# Patient Record
Sex: Male | Born: 1960
Health system: Southern US, Community
[De-identification: ages and names within clinical notes are randomized; demographics above are authoritative.]

## PROBLEM LIST (undated history)

## (undated) DIAGNOSIS — Z955 Presence of coronary angioplasty implant and graft: Secondary | ICD-10-CM

## (undated) DIAGNOSIS — I252 Old myocardial infarction: Secondary | ICD-10-CM

## (undated) DIAGNOSIS — I10 Essential (primary) hypertension: Secondary | ICD-10-CM

## (undated) DIAGNOSIS — K760 Fatty (change of) liver, not elsewhere classified: Secondary | ICD-10-CM

## (undated) DIAGNOSIS — D649 Anemia, unspecified: Secondary | ICD-10-CM

## (undated) DIAGNOSIS — I5189 Other ill-defined heart diseases: Secondary | ICD-10-CM

## (undated) DIAGNOSIS — I255 Ischemic cardiomyopathy: Secondary | ICD-10-CM

## (undated) DIAGNOSIS — I509 Heart failure, unspecified: Secondary | ICD-10-CM

## (undated) DIAGNOSIS — H5702 Anisocoria: Secondary | ICD-10-CM

## (undated) DIAGNOSIS — I219 Acute myocardial infarction, unspecified: Secondary | ICD-10-CM

## (undated) DIAGNOSIS — K219 Gastro-esophageal reflux disease without esophagitis: Secondary | ICD-10-CM

## (undated) DIAGNOSIS — N189 Chronic kidney disease, unspecified: Secondary | ICD-10-CM

## (undated) DIAGNOSIS — N184 Chronic kidney disease, stage 4 (severe): Secondary | ICD-10-CM

## (undated) DIAGNOSIS — I4891 Unspecified atrial fibrillation: Secondary | ICD-10-CM

## (undated) HISTORY — DX: Chronic kidney disease, unspecified: N18.9

## (undated) HISTORY — PX: INSERTION OF DIALYSIS CATHETER: SHX1324

## (undated) HISTORY — PX: DIALYSIS FISTULA CREATION: SHX611

## (undated) NOTE — *Deleted (*Deleted)
  Echocardiogram Echocardiogram Transesophageal has been performed.  Johny Chess 12/05/2019, 5:26 PM

---

## 2005-05-13 ENCOUNTER — Emergency Department: Payer: Self-pay | Admitting: Emergency Medicine

## 2005-05-13 ENCOUNTER — Inpatient Hospital Stay (HOSPITAL_COMMUNITY): Admission: AD | Admit: 2005-05-13 | Discharge: 2005-05-16 | Payer: Self-pay | Admitting: Nephrology

## 2005-05-14 ENCOUNTER — Encounter (INDEPENDENT_AMBULATORY_CARE_PROVIDER_SITE_OTHER): Payer: Self-pay | Admitting: Cardiology

## 2005-05-14 ENCOUNTER — Encounter: Payer: Self-pay | Admitting: Vascular Surgery

## 2005-06-12 ENCOUNTER — Ambulatory Visit (HOSPITAL_COMMUNITY): Admission: RE | Admit: 2005-06-12 | Discharge: 2005-06-12 | Payer: Self-pay | Admitting: Vascular Surgery

## 2005-06-22 ENCOUNTER — Ambulatory Visit (HOSPITAL_COMMUNITY): Admission: RE | Admit: 2005-06-22 | Discharge: 2005-06-22 | Payer: Self-pay | Admitting: *Deleted

## 2005-11-24 ENCOUNTER — Ambulatory Visit (HOSPITAL_COMMUNITY): Admission: RE | Admit: 2005-11-24 | Discharge: 2005-11-24 | Payer: Self-pay | Admitting: Nephrology

## 2007-07-27 ENCOUNTER — Ambulatory Visit (HOSPITAL_COMMUNITY): Admission: RE | Admit: 2007-07-27 | Discharge: 2007-07-27 | Payer: Self-pay | Admitting: Nephrology

## 2008-03-26 ENCOUNTER — Ambulatory Visit (HOSPITAL_COMMUNITY): Admission: RE | Admit: 2008-03-26 | Discharge: 2008-03-26 | Payer: Self-pay | Admitting: Nephrology

## 2008-08-15 ENCOUNTER — Ambulatory Visit: Payer: Self-pay | Admitting: Nephrology

## 2008-08-16 ENCOUNTER — Ambulatory Visit (HOSPITAL_COMMUNITY): Admission: RE | Admit: 2008-08-16 | Discharge: 2008-08-16 | Payer: Self-pay | Admitting: Nephrology

## 2009-02-07 ENCOUNTER — Ambulatory Visit: Payer: Self-pay | Admitting: Vascular Surgery

## 2009-02-13 ENCOUNTER — Emergency Department (HOSPITAL_COMMUNITY): Admission: EM | Admit: 2009-02-13 | Discharge: 2009-02-13 | Payer: Self-pay | Admitting: Emergency Medicine

## 2009-02-16 DIAGNOSIS — Z940 Kidney transplant status: Secondary | ICD-10-CM

## 2009-02-16 HISTORY — DX: Kidney transplant status: Z94.0

## 2009-02-16 HISTORY — PX: KIDNEY TRANSPLANT: SHX239

## 2009-04-16 DIAGNOSIS — N189 Chronic kidney disease, unspecified: Secondary | ICD-10-CM

## 2009-04-16 HISTORY — DX: Chronic kidney disease, unspecified: N18.9

## 2010-03-09 ENCOUNTER — Encounter: Payer: Self-pay | Admitting: Nephrology

## 2010-06-03 LAB — POTASSIUM: Potassium: 5.4 mEq/L — ABNORMAL HIGH (ref 3.5–5.1)

## 2010-07-01 NOTE — Procedures (Signed)
CEPHALIC VEIN MAPPING   INDICATION:  End-stage renal disease.   HISTORY:  End-stage renal disease.   EXAM:  The right cephalic vein is compressible.   Diameter measurements range from 0.29 to 0.30.   The left cephalic vein is not evaluated.   See attached worksheet for all measurements.   IMPRESSION:  Patent left cephalic vein which is of acceptable diameter  for use as a dialysis access site.   ___________________________________________  Judeth Cornfield. Scot Dock, M.D.   MG/MEDQ  D:  02/07/2009  T:  02/07/2009  Job:  [id number]

## 2010-07-01 NOTE — Assessment & Plan Note (Signed)
OFFICE VISIT   [name], [name]  DOB:  Feb 12, 1961                                       02/07/2009  [id number]   I saw the patient in the office today to evaluate his upper arm fistula  in the left arm.  This was placed in May 2007 by Dr. Amedeo Plenty.  He had  previously had a forearm fistula which never matured and then  subsequently a month later had his upper arm fistula placed which has  been working well.  Recently he had developed some redness, warmth and  swelling over one of the aneurysms in his fistula and we were asked to  evaluate this.  He denies any history of fever or chills.  He states  that the fistula has been working well although they continue to stick  the two aneurysms in his fistula.   The patient does have a history of hypertension which is stable on his  current medications.  In addition he has a history of secondary  hyperparathyroidism and iron deficiency anemia both of which are stable.  He denies any history of diabetes, history of previous myocardial  infarction, history of congestive heart failure and history of COPD.   SOCIAL HISTORY:  Married and has two children.  He does not use tobacco.   REVIEW OF SYSTEMS:  CARDIOVASCULAR:  He has had no chest pain, chest  pressure, palpitations or arrhythmias.  He has had no claudication, rest  pain or nonhealing ulcers.  Has had no history of stroke, TIAs or  amaurosis fugax.  No history of DVT or phlebitis.  PULMONARY:  He has had no productive cough bronchitis, asthma or  wheezing.  NEUROLOGIC:  He has had no dizziness, blackouts, headaches or seizures.   PHYSICAL EXAMINATION:  This is a pleasant 50 year old gentleman who  appears his stated age.  Blood pressure 189/108, heart rate is 94,  respiratory rate 24.  Cardiovascular examination; I do not detect any  carotid bruits.  He has a regular rate and rhythm without murmur or  gallop appreciated.  He has no significant peripheral  edema.  He has  palpable radial and femoral pulses with warm well-perfused feet and no  ischemic ulcers.  Abdomen:  Soft and nontender with normal pitched bowel  sounds and no masses appreciated.  Neurologic examination; he has no  focal weakness or paresthesias.  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  The fistula has a good thrill.  Of note, he has  previously had stents placed in the more central portion of the vein.   The patient had a cephalic vein mapping today which I independently  interpreted which shows a somewhat small forearm and upper arm cephalic  vein on the right.   I have explained I do not think there is any way to salvage the fistula  on the left as his aneurysms are quite large.  I have recommended we  place new access in the right arm and once this is functioning we can  ligate his left arm fistula and perhaps resect the aneurysms.  Think it  is safe to continue to use the fistula in left arm as long as they do  not stick the aneurysms.  I have recommend we  explore his forearm  cephalic vein on the right and if this is adequate place a forearm  fistula.  If this is not adequate we will explore his upper arm cephalic  vein.  If neither are adequate then we would have to place an AV graft.  I have discussed the procedure and potential complications with the  patient.  He is agreeable to proceed.  Surgery has been scheduled for  January 11.     Judeth Cornfield. Scot Dock, M.D.  Electronically Signed   CSD/MEDQ  D:  02/07/2009  T:  02/12/2009  Job:  [id number]

## 2010-07-04 NOTE — Discharge Summary (Signed)
NAMECAESON, GALINSKY NO.:  [id number]   MEDICAL RECORD NO.:  [id number]          PATIENT TYPE:  INP   LOCATION:  5511                         FACILITY:  [facility]   PHYSICIAN:  Sherril Croon, M.D.   DATE OF BIRTH:  Jun 30, 1960   DATE OF ADMISSION:  05/13/2005  DATE OF DISCHARGE:  05/16/2005                                 DISCHARGE SUMMARY   ADMITTING DIAGNOSES:  1.  Chronic kidney disease suspected end-stage renal disease.  2.  Anemia of chronic disease.  3.  Hyperkalemia.  4.  Hypertension.  5.  Noncompliance.   DISCHARGE DIAGNOSES:  1.  End-stage renal disease secondary to hypertension now on chronic      hemodialysis.  2.  Status post placement of a right internal jugular bard hemosplit      catheter, interventional radiology, Dr. Barbie Banner, May 14, 2005.  3.  Hypertension, now off medicines.  4.  Iron-deficiency anemia.  5.  Secondary hyperparathyroidism.  6.  Hyperkalemia, corrected with dialysis.  7.  History of gout.  8.  History of noncompliance.   BRIEF HISTORY:  A 50 year old African-American male lives in [place]  followed by Dr. Ivar Bury at the [facility], nephrologist,  who was told he had kidney disease approximately 12 months ago but could not  recall details.  He elected not to see Dr. Ivar Bury in follow up.  He  presented to the [facility] emergency room with a brief episode of  diaphoresis, palpitations and presyncope.  He was found to have an elevated  serum creatinine of 14 and a BUN of over 100.  In the emergency room, he was  hyperkalemic with a potassium of 5.9.  He was given a Kayexalate and  transferred to [place]:  Sodium 138, potassium 5.6, chloride 106, glucose 98, CO2  21, BUN 108, creatinine 14.2, calcium 9.0.  White count 8600, hemoglobin  7.1, platelets 262,000.   HOSPITAL COURSE:  Workup included ruling out reversible causes of his kidney  failure.  Ultrasound of his kidneys  showed bilateral echogenic kidneys  compatible with medical renal disease measuring 8.2 cm on the right and 10.3  cm on the left.  Serum protein electrophoresis was negative for monoclonal  gammopathy.  Other parameters indicating chronic and end-stage renal disease  were anemia, hyperphosphatemia, secondary hyperparathyroidism with an  elevated intact PTH level of 1144.  His HIV was nonreactive.  Hepatitis B  surface antigen was negative.  Urinalysis showed trace hemoglobin, mild  proteinuria, trace leukocyte esterase with 3 to 6 white and red blood cells  per high-power field.   It was evident that the patient needed to start dialysis and he had reached  end-stage renal disease.  On May 14, 2005, Dr. Barbie Banner of interventional  radiology at [facility] placed a right internal jugular hemosplit bard  tunneled dialysis catheter without complications.  Chest x-ray in follow up  was negative for pneumothorax.  Filling volumes on that catheter are 1.8 mL  arterial port and 1.9 mL venous port.  He dialyzed his first treatment  on  May 14, 2005 and again on May 16, 2005.  He tolerated both treatments  well.  His dry weight is estimated at 90 kg.  His blood pressures have been  normal less than 130/80.  His antihypertensive and diuretic medications have  been stopped.   Vein mapping was done of his left arm which revealed adequate veins for  fistula creation.  Dr. Sherren Mocha Early saw the patient in consult and stated that  his office will contact the patient to arrange for elective access of the  left arm the week after discharge.  Outpatient dialysis arrangements were  made every Tuesday, Thursday, Saturday second shift at the [facility].  He will arrive an hour early on May 19, 2005 to sign the paper  work with the Education officer, museum.   In response to this hyperparathyroidism, calcitriol will be started and  loaded at 0.5 mg IV x3, then increase to 1.0 mcg IV x3, then increase to  1.5  mcg IV each dialysis thereafter.  Follow-up PTH will be obtained with April  monthly labs.  InFeD test dose will be given at 25 mg then 50 mg IV weekly  for transferrin saturation level of 31%.  EPO will be given at 10,000 units  IV each dialysis for hemoglobin of 9.5 at time of discharge.  He was  transfused 2 units of packed red blood cells this hospitalization.   DISCHARGE MEDICATIONS:  1.  Dialyvite vitamin one daily.  2.  PhosLo 667 mg two with meals.  3.  Allopurinol 150 mg daily.  4.  EPO 10,000 units IV each dialysis.  5.  InFeD 25 mg IV test dose and then 50 mg IV weekly in dialysis.  6.  Hectorol 0.5 mcg IV each dialysis x3, then 1.0 mcg IV each dialysis x3,      then 1.5 mcg IV each dialysis.   CVTS to contact the patient and schedule a left arm fistula next week.  Dialysis Tuesday, Thursday, Saturday at [facility].  The  estimated dry weight 90 kg.      Nonah Mattes, P.A.      Sherril Croon, M.D.  Electronically Signed    RRK/MEDQ  D:  05/16/2005  T:  05/18/2005  Job:  [id number]   cc:   Rosetta Posner, M.D.  [address]  [place]  [place] [address]   [facility]

## 2010-07-04 NOTE — H&P (Signed)
NAMETOMMEY, OUIMETTE NO.:  [id number]   MEDICAL RECORD NO.:  [id number]          PATIENT TYPE:  INP   LOCATION:  3301                         FACILITY:  [facility]   PHYSICIAN:  Sherril Croon, M.D.   DATE OF BIRTH:  May 12, 1960   DATE OF ADMISSION:  05/13/2005  DATE OF DISCHARGE:                                HISTORY & PHYSICAL   This 50 year old African-American male lives in [place], followed by Dr.  Joella Prince who was told he had kidney disease approximately 12 months ago but  could not recall the details.  He elected not to see Dr. Joella Prince in followup  who presented to [facility] emergency room with a brief episode of  diaphoresis, palpitations, and presyncope.  He was found to have an elevated  serum creatinine of 14 and a BUN of over 100.  In the emergency room he was  hypokalemic with potassium of 5.9 and was administered Kayexalate and  transferred to [facility].   PAST MEDICAL HISTORY:  1.  Hypertension.  2.  Chronic kidney disease, unknown creatinine or GFR.  3.  Noncompliance.  4.  Gout.   MEDICATIONS:  1.  Lotrel one daily.  2.  Hydrochlorothiazide 25 mg daily.   ALLERGIES:  No known drug allergies.   SOCIAL HISTORY:  No tobacco.  No alcohol.  Works as a Designer, industrial/product.   FAMILY HISTORY:  Married.  Two children.  Auntie has end-stage kidney  disease.   REVIEW OF SYSTEMS:  Positive fatigue and weakness.  Eyes:  No visual  complaints, double vision, eye pain.  Ears, nose, mouth, throat:  Denies  vertigo.  No hearing loss.  No epistaxis.  CARDIOVASCULAR:  No anginal chest  pain.  Brief episode of palpitations which was regular, associated with some  fatigue and lightheadedness.  No dyspnea.  Exercise tolerance decreased.  RESPIRATORY:  No cough, wheeze, hemoptysis.  ABDOMEN:  No weight loss,  nausea, vomiting.  Positive for malodor from mouth and halitosis.  No  diarrhea.  UROGENITAL:  No dysuria.  Positive for foamy urine.  MUSCULOSKELETAL:   Positive for right hip pain, use of ibuprofen 400 mg last  week.  DERMATOLOGIC:  No skin rashes.  NEUROLOGIC:  Positive for tremor,  myoclonus for three weeks.  No diplopia, vertigo, paraesthesias.   PHYSICAL EXAMINATION:  GENERAL:  Alert, non-distressed, uremic fetor.  Half-  and-half nails.  VITAL SIGNS:  Blood pressure 140/80, pulse is 75, temperature afebrile.  HEENT:  Normocephalic, atraumatic.  Pale subconjunctival membrane.  Ears,  nose, mouth, throat were normal.  Oropharynx was clear.  NECK:  Supple.  No thyromegaly or adenopathy.  JVD elevated 4 cm.  CARDIOVASCULAR:  Regular rate and rhythm.  3/6 systolic murmur.  No rubs or  gallops.  RESPIRATORY:  Lung fields were clear.  Decreased entry in the bases.  Percussion note dull.  Crackles.  ABDOMEN:  Soft, nontender.  Bowel sounds active.  EXTREMITIES:  2+ pulses right and left.  No edema.  NEUROLOGIC:  Cranial nerves II-XII are intact.  No decrease in sensation.  No decrease in motor skills.  LABORATORIES:  WBC 8.3, hemoglobin 7.1, platelets 262.  Potassium 5.9,  sodium 142, chloride 105, CO2 23, BUN 114, creatinine 14.7, glucose 109,  albumin 3.9, AST 9, ALT 27, calcium 9.3.   ASSESSMENT/PLAN:  1.  Chronic kidney disease possibly end-stage renal disease.  Check SPEP.      Check renal ultrasound, urine microscopy.  Need records from [facility]      and show dialysis video.  2.  Anemia.  Check iron stores.  Start Aranesp.  3.  Palpitations.  Cycle enzymes.  Cardiac monitor.  2-D echocardiogram.  4.  Bones.  Will check PTH.  5.  Hyperkalemia.  Low potassium diet.  Avoid ACE and ARBs, nonsteroidal      anti-inflammatories.   DISPOSITION:  Would suspect patient will need Perm-Cath and dialysis in the  morning.  Keep n.p.o. after midnight.  Consult for Diatek catheter with  interventional radiology.      Sherril Croon, M.D.  Electronically Signed     MWW/MEDQ  D:  05/13/2005  T:  05/14/2005  Job:  [id number]

## 2010-07-04 NOTE — Op Note (Signed)
NAME:  Casey [name], Casey [name]               ACCOUNT NO.:  [id number]   MEDICAL RECORD NO.:  [id number]          PATIENT TYPE:  AMB   LOCATION:  SDS                          FACILITY:  [facility]   PHYSICIAN:  Dorothea Glassman, M.D.    DATE OF BIRTH:  05-Apr-1960   DATE OF PROCEDURE:  06/22/2005  DATE OF DISCHARGE:  06/22/2005                                 OPERATIVE REPORT   SURGEON:  Gordy Clement, MD   ASSISTANT:  RNFA.   ANESTHETIC:  Local with MAC.   PREOPERATIVE DIAGNOSIS:  End-stage renal failure.   POSTOPERATIVE DIAGNOSIS:  End-stage renal failure.   PROCEDURE:  Left brachiocephalic arteriovenous fistula.   OPERATIVE PROCEDURE:  The patient was brought to the operating room in a  stable condition.  Placed in supine position.  Left arm was prepped and  draped in sterile fashion.  Skin and subcutaneous tissues were instilled  with 1% Xylocaine with epinephrine.  Transverse skin incision was made  through the left antecubital fossa.  Dissection carried down through the  subcutaneous tissue with electrocautery.  The left antecubital cephalic vein  was identified.  Tributaries were ligated with 3-0 and 4-0 silk and divided.  The vein mobilized adequately, ligated distally with 3-0 silk and divided.  Dilated to 0.5 mm easily.  The brachial artery then freed and encircled with  a Vesseloop.  The patient was administered 3000 units of heparin  intravenously.  The brachial artery was controlled proximally and distally  with bulldog clamps.  A longitudinal arteriotomy was made.  The cephalic  vein was anastomosed end-to-side to the brachial artery using a running 7-0  Prolene suture.  Clamps were then removed.  Excellent flow present.  Adequate hemostasis obtained.  Sponges and instrument counts correct.   Subcutaneous tissue was closed with a running 3-0 Vicryl suture.  Skin was  closed 4-0 Monocryl.  Steri-Strips were applied.   The patient tolerated the procedure well.  No apparent  complications.  Transferred to the recovery room in stable condition.      Dorothea Glassman, M.D.  Electronically Signed     PGH/MEDQ  D:  06/22/2005  T:  06/23/2005  Job:  [id number]

## 2010-07-04 NOTE — Op Note (Signed)
NAME:  Casey [name], Casey [name]               ACCOUNT NO.:  [id number]   MEDICAL RECORD NO.:  [id number]          PATIENT TYPE:  AMB   LOCATION:  SDS                          FACILITY:  [place] [facility]   PHYSICIAN:  Rosetta Posner, M.D.    DATE OF BIRTH:  03-11-1960   DATE OF PROCEDURE:  06/12/2005  DATE OF DISCHARGE:  06/12/2005                                 OPERATIVE REPORT   PREOPERATIVE DIAGNOSIS:  End-stage renal disease.   POSTOPERATIVE DIAGNOSIS:  End-stage renal disease.   PROCEDURE:  Left wrist Cimino AV fistula.   SURGEON:  Rosetta Posner, M.D.   ASSISTANT:  John Giovanni, P.A.-C.   ANESTHESIA:  MAC.   COMPLICATIONS:  None.   DISPOSITION:  To recovery room, stable.   PROCEDURE IN DETAIL:  The patient was taken to the operating room and placed  in the supine position.  The area of the left arm was prepped and draped in  the usual sterile fashion.  Using local anesthesia, an incision was made  between the level of the cephalic vein and the radial artery to wrist.  The  cephalic vein was small to moderate size.  The vein was mobilized proximally  and distally and was ligated and divided distally.  It was gently dilated  with heparinized saline.  The vein became somewhat larger, more proximal on  the wrist, and therefore the incision was extended, and above a small branch  the vein was of better caliber.  It was approximately 3 mm in diameter.  The  radial artery was exposed through the same incision.  The vein was cut to  the appropriate length and was spatulated.  The radial artery was occluded  proximally and distally with __________  clamps and was opened with an 11  blade and extended longitudinally with Potts scissors.  The vein was sewn  end to side with the artery with a running 6-0 Prolene suture.  Clamps were  removed, and good thrill was note through the vein.  The wounds were  irrigated with saline.  Hemostasis was with electrocautery.  Wounds were  closed with 3-0 Vicryl in  the subcutaneous and subcuticular tissue.  Benzoin  and Steri-Strips were applied.      Rosetta Posner, M.D.  Electronically Signed     TFE/MEDQ  D:  06/12/2005  T:  06/12/2005  Job:  [id number]

## 2011-02-12 ENCOUNTER — Telehealth: Payer: Self-pay

## 2011-02-12 NOTE — Telephone Encounter (Signed)
PC from pt. with c/o a tingling sensation in left arm, left hand cool, numbness in 4 fingers, and discoloration of thumbnail.  Stated this started yesterday.  Has hx of left upper arm fistula and of aneurysm of same. States he is no longer on HD and had a kidney transplant 04/2009.  Discussed w/ Dr. Oneida Alar.  States no vascular study prior to clinical evaluation.  Appt.given for 12/28 at 2:20 pm.  Advised pt. If symptoms worsen/progress this evening, to go to ER for evaluation.  Verb. Understanding.

## 2011-02-13 ENCOUNTER — Encounter: Payer: Self-pay | Admitting: Thoracic Diseases

## 2011-02-13 ENCOUNTER — Ambulatory Visit (INDEPENDENT_AMBULATORY_CARE_PROVIDER_SITE_OTHER): Payer: BC Managed Care – PPO | Admitting: Thoracic Diseases

## 2011-02-13 VITALS — BP 152/89 | HR 75 | Temp 98.4°F | Resp 16 | Ht 73.0 in | Wt 213.0 lb

## 2011-02-13 DIAGNOSIS — N186 End stage renal disease: Secondary | ICD-10-CM | POA: Insufficient documentation

## 2011-02-13 DIAGNOSIS — G5602 Carpal tunnel syndrome, left upper limb: Secondary | ICD-10-CM

## 2011-02-13 DIAGNOSIS — G5600 Carpal tunnel syndrome, unspecified upper limb: Secondary | ICD-10-CM

## 2011-02-13 DIAGNOSIS — T82898A Other specified complication of vascular prosthetic devices, implants and grafts, initial encounter: Secondary | ICD-10-CM | POA: Insufficient documentation

## 2011-02-13 NOTE — Progress Notes (Signed)
[facility] HISTORY AND PHYSICAL   CC: Left hand numbness and pain in left palm. LB_C AVF 2007, now occluded  History of Present Illness: Casey [name] is a 50 y.o. male Who had a left brachiocephalic AVF placed by Dr. Donnetta Hutching in 2007. This developed multiple pseudoaneurysms and was also stented. It is now no longer functioning. He received a kidney transplant in 04/2009. This has been functioning well.  Pt is a Development worker, international aid and has been having difficulty holding the leash secondary to aching pain in the left palm and numbness in the thumb through 3rd fingers of his left hand. He states the numbness sometimes comes from hi shoulder to his hand. The pseudoaneurysms have not changed in size. There are no signs of redness. He states his grip is not as strong.  Past Medical History  Diagnosis Date  . Chronic kidney disease 04/2009    Kidney Transplant    ROS: [x]  Positive   [ ]  Negative   [ ]  All sytems reviewed and are negative  General: [ ]  Weight loss, [ ]  Fever, [ ]  chills Neurologic: [ ]  Dizziness, [ ]  Blackouts, [ ]  Seizure [ ]  Stroke, [ ]  "Mini stroke", [ ]  Slurred speech, [ ]  Temporary blindness; [ ]  weakness in arms or legs, [ ]  Hoarseness Cardiac: [ ]  Chest pain/pressure, [ ]  Shortness of breath at rest [ ]  Shortness of breath with exertion, [ ]  Atrial fibrillation or irregular heartbeat Vascular: [ ]  Pain in legs with walking, [ ]  Pain in legs at rest, [ ]  Pain in legs at night,  [ ]  Non-healing ulcer, [ ]  Blood clot in vein/DVT,   Pulmonary: [ ]  Home oxygen, [ ]  Productive cough, [ ]  Coughing up blood, [ ]  Asthma,  [ ]  Wheezing Musculoskeletal:  [ ]  Arthritis, [ ]  Low back pain, [x ] Joint pain [x]  carpal tunnel symptoms as above Hematologic: [ ]  Easy Bruising, [ ]  Anemia; [ ]  Hepatitis Gastrointestinal: [ ]  Blood in stool, [ ]  Gastroesophageal Reflux/heartburn, [ ]  Trouble swallowing Urinary: [x ] chronic Kidney disease, [x]  kidney transplant [ ]  on HD  - [ ]  MWF or [ ]  TTHS, [ ]  Burning with urination, [ ]  Difficulty urinating Skin: [ ]  Rashes, [ ]  Wounds Psychological: [ ]  Anxiety, [ ]  Depression   Social History History  Substance Use Topics  . Smoking status: Never Smoker   . Smokeless tobacco: Not on file  . Alcohol Use:     Family History Family History  Problem Relation Age of Onset  . Diabetes Mother   . Hyperlipidemia Mother   . Cancer Father     Not on File  Current Outpatient Prescriptions  Medication Sig Dispense Refill  . amLODipine (NORVASC) 5 MG tablet Take 5 mg by mouth 2 (two) times daily.        Marland Kitchen aspirin 81 MG tablet Take 81 mg by mouth daily.        Marland Kitchen atorvastatin (LIPITOR) 40 MG tablet Take 40 mg by mouth daily.        . cetirizine (ZYRTEC) 5 MG tablet Take 5 mg by mouth daily.        . cloNIDine (CATAPRES) 0.1 MG tablet Take 0.1 mg by mouth every 12 (twelve) hours.        Marland Kitchen glipiZIDE (GLUCOTROL) 5 MG tablet Take 5 mg by mouth every morning.        . labetalol (NORMODYNE) 200 MG tablet Take  200 mg by mouth 2 (two) times daily.        . mycophenolate (CELLCEPT) 250 MG capsule Take 250 mg by mouth every 12 (twelve) hours.        Marland Kitchen omeprazole (PRILOSEC) 20 MG capsule Take 20 mg by mouth daily.        . prednisoLONE 5 MG TABS Take 5 mg by mouth once.        . sulfamethoxazole-trimethoprim (BACTRIM DS) 800-160 MG per tablet Take 1 tablet by mouth 3 (three) times a week.        . tacrolimus (PROGRAF) 1 MG capsule Take 1 mg by mouth 2 (two) times daily.          Physical Examination  Filed Vitals:   02/13/11 1426  BP: 152/89  Pulse: 75  Temp: 98.4 F (36.9 C)  Resp: 16    Body mass index is 28.10 kg/(m^2).  General:  WDWN in NAD Gait: Normal HENT: WNL Eyes: Pupils equal Pulmonary: normal non-labored breathing , without Rales, rhonchi,  wheezing Cardiac: RRR, without  Murmurs, rubs or gallops; No carotid bruits Skin: no rashes, ulcers noted Vascular Exam/Pulses: 3+ radial pulse left  hand Doppler signal in the Ulnar artery and palmar arch Left thumb nailbed paler than right Large pseudoaneurysms in left Brachiocephalic AVF with no thrill or bruit Extremities without ischemic changes, no Gangrene , no cellulitis; no open wounds;  Musculoskeletal: no muscle wasting or atrophy, 4+/5 grip on left, + phalens test left wrist reproducing numbness in left hand/ fingers  Neurologic: A&O X 3; Appropriate Affect ;  SENSATION: normal; MOTOR FUNCTION:  moving all extremities equally with decreased grip on left. 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

## 2011-03-02 ENCOUNTER — Encounter: Payer: Self-pay | Admitting: Vascular Surgery

## 2011-03-03 ENCOUNTER — Encounter: Payer: Self-pay | Admitting: Vascular Surgery

## 2011-03-03 ENCOUNTER — Ambulatory Visit (INDEPENDENT_AMBULATORY_CARE_PROVIDER_SITE_OTHER): Payer: BC Managed Care – PPO | Admitting: Vascular Surgery

## 2011-03-03 VITALS — BP 152/89 | HR 77 | Resp 16 | Ht 73.0 in | Wt 211.0 lb

## 2011-03-03 DIAGNOSIS — N186 End stage renal disease: Secondary | ICD-10-CM | POA: Insufficient documentation

## 2011-03-03 DIAGNOSIS — I729 Aneurysm of unspecified site: Secondary | ICD-10-CM

## 2011-03-03 NOTE — Progress Notes (Signed)
Vascular and Vein Specialist of [facility]   Patient name: Casey [name] MRN: [id number] DOB: 1960/08/12 Sex: male     Reason for referral:  Chief Complaint  Patient presents with  . AVF    Discuss removal of pseudoaneurysm left arm   . ESRD    HISTORY OF PRESENT ILLNESS: The patient is long history of end-stage renal disease with a successful kidney transplant in March of 2011. Up until that time he was being dialyzed via a left upper arm AV fistula that was placed by myself in 2007. He had had some degenerative tear degeneration aneurysmal change. He subsequently occluded his outflow to the fistula. 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  Past Medical History  Diagnosis Date  . Chronic kidney disease 04/2009    Kidney Transplant    History reviewed. No pertinent past surgical history.  History   Social History  . Marital Status: Married    Spouse Name: N/A    Number of Children: N/A  . Years of Education: N/A   Occupational History  . Not on file.   Social History Main Topics  . Smoking status: Never Smoker   . Smokeless tobacco: Not on file  . Alcohol Use:   . Drug Use:   . Sexually Active:    Other Topics Concern  . Not on file   Social History Narrative  . No narrative on file    Family History  Problem Relation Age of Onset  . Diabetes Mother   . Hyperlipidemia Mother   . Cancer Father     Allergies as of 03/03/2011  . (Not on File)    Current Outpatient Prescriptions on File Prior to Visit  Medication Sig Dispense Refill  . amLODipine (NORVASC) 5 MG tablet Take 5 mg by mouth 2 (two) times daily.        Marland Kitchen aspirin 81 MG tablet Take 81 mg by mouth daily.        Marland Kitchen atorvastatin (LIPITOR) 40 MG tablet Take 40 mg by mouth daily.        . cetirizine (ZYRTEC) 5 MG tablet Take 5 mg by mouth daily.         . cloNIDine (CATAPRES) 0.1 MG tablet Take 0.1 mg by mouth every 12 (twelve) hours.        Marland Kitchen glipiZIDE (GLUCOTROL) 5 MG tablet Take 5 mg by mouth every morning.        . labetalol (NORMODYNE) 200 MG tablet Take 200 mg by mouth 2 (two) times daily.        . mycophenolate (CELLCEPT) 250 MG capsule Take 250 mg by mouth every 12 (twelve) hours.        Marland Kitchen omeprazole (PRILOSEC) 20 MG capsule Take 20 mg by mouth daily.        . prednisoLONE 5 MG TABS Take 5 mg by mouth once.        . sulfamethoxazole-trimethoprim (BACTRIM DS) 800-160 MG per tablet Take 1 tablet by mouth 3 (three) times a week.        . tacrolimus (PROGRAF) 1 MG capsule Take 1 mg by mouth 2 (two) times daily.           REVIEW OF SYSTEMS:  Positives indicated with an "X"  CARDIOVASCULAR:  [ ]  chest pain   [ ]   chest pressure   [ ]  palpitations   [ ]  orthopnea   [ ]  dyspnea on exertion   [ ]  claudication   [ ]  rest pain   [ ]  DVT   [ ]  phlebitis PULMONARY:   [ ]  productive cough   [ ]  asthma   [ ]  wheezing NEUROLOGIC:   [ ]  weakness  [ ]  paresthesias  [ ]  aphasia  [ ]  amaurosis  [ ]  dizziness HEMATOLOGIC:   [ ]  bleeding problems   [ ]  clotting disorders MUSCULOSKELETAL:  [ ]  joint pain   [ ]  joint swelling GASTROINTESTINAL: [ ]   blood in stool  [ ]   hematemesis GENITOURINARY:  [ ]   dysuria  [ ]   hematuria PSYCHIATRIC:  [ ]  history of major depression INTEGUMENTARY:  [ ]  rashes  [ ]  ulcers CONSTITUTIONAL:  [ ]  fever   [ ]  chills  PHYSICAL EXAMINATION:  General: The patient is a well-nourished male, in no acute distress. Vital signs are BP 152/89  Pulse 77  Resp 16  Ht 6\' 1"  (1.854 m)  Wt 211 lb (95.709 kg)  BMI 27.84 kg/m2  SpO2 100% Pulmonary: There is a good air exchange bilaterally   Abdomen: Soft and non-tender   Musculoskeletal: There are no major deformities.  There is no significant extremity pain. Neurologic: No focal weakness or paresthesias are detected, Skin: There are no ulcer or rashes  noted. Psychiatric: The patient has normal affect. Pulse status all and he does have 2+ radial pulses bilaterally. 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 There is pulsatile expansile flow but no bruit. I feel that he does have enough of a small outflow to keep his fistula patent.    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 I explained that even if he did have need for hemodialysis in the future that this would not be adequate since it does not have any outflow. He is requesting removal of this. I explained that it would be safe to leave this in place also would be acceptable to remove due to the pain and discomfort associated with the size. He wished to proceed with outpatient surgery for correction and removal of this. Surgery scheduled for 03/09/2011. This would be as an outpatient    [place] [facility] [place] Office: [phone]

## 2011-03-04 ENCOUNTER — Other Ambulatory Visit: Payer: Self-pay

## 2011-03-06 ENCOUNTER — Encounter (HOSPITAL_COMMUNITY)
Admission: RE | Admit: 2011-03-06 | Discharge: 2011-03-06 | Disposition: A | Discharge status: Home / Self Care | Payer: BC Managed Care – PPO | Source: Ambulatory Visit | Attending: Anesthesiology | Admitting: Anesthesiology

## 2011-03-06 ENCOUNTER — Encounter (HOSPITAL_COMMUNITY): Payer: Self-pay | Admitting: Respiratory Therapy

## 2011-03-06 ENCOUNTER — Encounter (HOSPITAL_COMMUNITY)
Admission: RE | Admit: 2011-03-06 | Discharge: 2011-03-06 | Disposition: A | Discharge status: Home / Self Care | Payer: BC Managed Care – PPO | Source: Ambulatory Visit | Attending: Vascular Surgery | Admitting: Vascular Surgery

## 2011-03-06 ENCOUNTER — Other Ambulatory Visit: Payer: Self-pay

## 2011-03-06 ENCOUNTER — Encounter (HOSPITAL_COMMUNITY): Payer: Self-pay

## 2011-03-06 HISTORY — DX: Gastro-esophageal reflux disease without esophagitis: K21.9

## 2011-03-06 HISTORY — DX: Essential (primary) hypertension: I10

## 2011-03-06 LAB — SURGICAL PCR SCREEN: Staphylococcus aureus: NEGATIVE

## 2011-03-06 LAB — CBC
HCT: 42.9 % (ref 39.0–52.0)
Hemoglobin: 13.9 g/dL (ref 13.0–17.0)
MCHC: 32.4 g/dL (ref 30.0–36.0)
RBC: 5.16 MIL/uL (ref 4.22–5.81)
WBC: 8.2 10*3/uL (ref 4.0–10.5)

## 2011-03-06 LAB — BASIC METABOLIC PANEL
BUN: 15 mg/dL (ref 6–23)
Chloride: 108 mEq/L (ref 96–112)
GFR calc Af Amer: 87 mL/min — ABNORMAL LOW (ref >90)
Potassium: 4.3 mEq/L (ref 3.5–5.1)

## 2011-03-06 NOTE — Pre-Procedure Instructions (Signed)
[place]  03/06/2011   Your procedure is scheduled on:  Mar 09, 2011 (Monday)  Report to [place] at 0730 AM.  Call this number if you have problems the morning of surgery: [phone]   Remember:   Do not eat food:After Midnight.  May have clear liquids: up to 4 Hours before arrival.  Clear liquids include soda, tea, black coffee, apple or grape juice, broth.  Take these medicines the morning of surgery with A SIP OF WATER: prograf, cellcept, prilosec,norvasc,lipitor,labetolol,zyrtec,clonidine   Do not wear jewelry, make-up or nail polish.  Do not wear lotions, powders, or perfumes. You may wear deodorant.  Do not shave 48 hours prior to surgery.  Do not bring valuables to the hospital.  Contacts, dentures or bridgework may not be worn into surgery.  Leave suitcase in the car. After surgery it may be brought to your room.  For patients admitted to the hospital, checkout time is 11:00 AM the day of discharge.   Patients discharged the day of surgery will not be allowed to drive home.  Name and phone number of your driver: Philo Glessner  [phone]  Special Instructions: CHG Shower Use Special Wash: 1/2 bottle night before surgery and 1/2 bottle morning of surgery.   Please read over the following fact sheets that you were given: Pain Booklet and Surgical Site Infection Prevention

## 2011-03-08 MED ORDER — DEXTROSE 5 % IV SOLN
1.5000 g | INTRAVENOUS | Status: AC
  Administered 2011-03-09: 1.5 g via INTRAVENOUS
  Filled 2011-03-08: qty 1.5

## 2011-03-09 ENCOUNTER — Ambulatory Visit (HOSPITAL_COMMUNITY)
Admission: RE | Admit: 2011-03-09 | Discharge: 2011-03-09 | Disposition: A | Discharge status: Home / Self Care | Payer: BC Managed Care – PPO | Source: Ambulatory Visit | Attending: Vascular Surgery | Admitting: Vascular Surgery

## 2011-03-09 ENCOUNTER — Ambulatory Visit (HOSPITAL_COMMUNITY): Payer: BC Managed Care – PPO | Admitting: Certified Registered"

## 2011-03-09 ENCOUNTER — Encounter (HOSPITAL_COMMUNITY): Payer: Self-pay | Admitting: Certified Registered"

## 2011-03-09 ENCOUNTER — Other Ambulatory Visit: Payer: Self-pay | Admitting: Vascular Surgery

## 2011-03-09 ENCOUNTER — Encounter (HOSPITAL_COMMUNITY): Payer: Self-pay | Admitting: *Deleted

## 2011-03-09 ENCOUNTER — Encounter (HOSPITAL_COMMUNITY)
Admission: RE | Disposition: A | Discharge status: Home / Self Care | Payer: Self-pay | Source: Ambulatory Visit | Attending: Vascular Surgery

## 2011-03-09 DIAGNOSIS — Z01810 Encounter for preprocedural cardiovascular examination: Secondary | ICD-10-CM | POA: Insufficient documentation

## 2011-03-09 DIAGNOSIS — I770 Arteriovenous fistula, acquired: Secondary | ICD-10-CM

## 2011-03-09 DIAGNOSIS — N189 Chronic kidney disease, unspecified: Secondary | ICD-10-CM | POA: Insufficient documentation

## 2011-03-09 DIAGNOSIS — Z01812 Encounter for preprocedural laboratory examination: Secondary | ICD-10-CM | POA: Insufficient documentation

## 2011-03-09 DIAGNOSIS — Y849 Medical procedure, unspecified as the cause of abnormal reaction of the patient, or of later complication, without mention of misadventure at the time of the procedure: Secondary | ICD-10-CM | POA: Insufficient documentation

## 2011-03-09 DIAGNOSIS — Z01818 Encounter for other preprocedural examination: Secondary | ICD-10-CM | POA: Insufficient documentation

## 2011-03-09 DIAGNOSIS — T82898A Other specified complication of vascular prosthetic devices, implants and grafts, initial encounter: Secondary | ICD-10-CM

## 2011-03-09 DIAGNOSIS — K219 Gastro-esophageal reflux disease without esophagitis: Secondary | ICD-10-CM | POA: Insufficient documentation

## 2011-03-09 DIAGNOSIS — I129 Hypertensive chronic kidney disease with stage 1 through stage 4 chronic kidney disease, or unspecified chronic kidney disease: Secondary | ICD-10-CM | POA: Insufficient documentation

## 2011-03-09 DIAGNOSIS — E119 Type 2 diabetes mellitus without complications: Secondary | ICD-10-CM | POA: Insufficient documentation

## 2011-03-09 DIAGNOSIS — Z9689 Presence of other specified functional implants: Secondary | ICD-10-CM | POA: Insufficient documentation

## 2011-03-09 HISTORY — PX: AV FISTULA PLACEMENT: SHX1204

## 2011-03-09 LAB — GLUCOSE, CAPILLARY
Glucose-Capillary: 134 mg/dL — ABNORMAL HIGH (ref 70–99)
Glucose-Capillary: 160 mg/dL — ABNORMAL HIGH (ref 70–99)

## 2011-03-09 SURGERY — ARTERIOVENOUS (AV) FISTULA CREATION
Anesthesia: General | Site: Arm Upper | Laterality: Left | Wound class: Clean

## 2011-03-09 MED ORDER — SODIUM CHLORIDE 0.9 % IV SOLN
INTRAVENOUS | Status: DC | PRN
  Administered 2011-03-09: 10:00:00 via INTRAVENOUS

## 2011-03-09 MED ORDER — PROMETHAZINE HCL 25 MG/ML IJ SOLN: 6.2500 mg | INTRAMUSCULAR | Status: DC | PRN

## 2011-03-09 MED ORDER — KETOROLAC TROMETHAMINE 30 MG/ML IJ SOLN
15.0000 mg | Freq: Once | INTRAMUSCULAR | Status: AC | PRN
  Administered 2011-03-09: 30 mg via INTRAVENOUS

## 2011-03-09 MED ORDER — SODIUM CHLORIDE 0.9 % IR SOLN
Status: DC | PRN
  Administered 2011-03-09: 11:00:00

## 2011-03-09 MED ORDER — PROPOFOL 10 MG/ML IV EMUL
INTRAVENOUS | Status: DC | PRN
  Administered 2011-03-09: 200 mg via INTRAVENOUS

## 2011-03-09 MED ORDER — MORPHINE SULFATE 2 MG/ML IJ SOLN
1.0000 mg | INTRAMUSCULAR | Status: DC | PRN
  Administered 2011-03-09: 2 mg via INTRAVENOUS

## 2011-03-09 MED ORDER — MORPHINE SULFATE 2 MG/ML IJ SOLN
INTRAMUSCULAR | Status: AC
  Filled 2011-03-09: qty 1

## 2011-03-09 MED ORDER — FENTANYL CITRATE 0.05 MG/ML IJ SOLN
INTRAMUSCULAR | Status: DC | PRN
  Administered 2011-03-09: 100 ug via INTRAVENOUS
  Administered 2011-03-09: 50 ug via INTRAVENOUS

## 2011-03-09 MED ORDER — SODIUM CHLORIDE 0.9 % IV SOLN
INTRAVENOUS | Status: DC
  Administered 2011-03-09: 09:00:00 via INTRAVENOUS

## 2011-03-09 MED ORDER — OXYCODONE HCL 5 MG PO TABS: 5.0000 mg | ORAL_TABLET | Freq: Four times a day (QID) | ORAL | Status: AC | PRN

## 2011-03-09 MED ORDER — 0.9 % SODIUM CHLORIDE (POUR BTL) OPTIME
TOPICAL | Status: DC | PRN
  Administered 2011-03-09: 1000 mL

## 2011-03-09 MED ORDER — MIDAZOLAM HCL 5 MG/5ML IJ SOLN
INTRAMUSCULAR | Status: DC | PRN
  Administered 2011-03-09: 1 mg via INTRAVENOUS

## 2011-03-09 SURGICAL SUPPLY — 40 items
APL SKNCLS STERI-STRIP NONHPOA (GAUZE/BANDAGES/DRESSINGS) ×1
BANDAGE ELASTIC 6 VELCRO ST LF (GAUZE/BANDAGES/DRESSINGS) ×1 IMPLANT
BENZOIN TINCTURE PRP APPL 2/3 (GAUZE/BANDAGES/DRESSINGS) ×2 IMPLANT
BLADE SURG CLIPPER 3M 9600 (MISCELLANEOUS) ×1 IMPLANT
CANISTER SUCTION 2500CC (MISCELLANEOUS) ×2 IMPLANT
CLIP LIGATING EXTRA MED SLVR (CLIP) ×1 IMPLANT
CLIP LIGATING EXTRA SM BLUE (MISCELLANEOUS) ×1 IMPLANT
CLOSURE STERI STRIP 1/2 X4 (GAUZE/BANDAGES/DRESSINGS) ×1 IMPLANT
CLOTH BEACON ORANGE TIMEOUT ST (SAFETY) ×2 IMPLANT
COVER PROBE W GEL 5X96 (DRAPES) ×2 IMPLANT
COVER SURGICAL LIGHT HANDLE (MISCELLANEOUS) ×4 IMPLANT
DECANTER SPIKE VIAL GLASS SM (MISCELLANEOUS) ×1 IMPLANT
ELECT REM PT RETURN 9FT ADLT (ELECTROSURGICAL) ×2
ELECTRODE REM PT RTRN 9FT ADLT (ELECTROSURGICAL) ×1 IMPLANT
GAUZE SPONGE 4X4 12PLY STRL LF (GAUZE/BANDAGES/DRESSINGS) ×1 IMPLANT
GEL ULTRASOUND 20GR AQUASONIC (MISCELLANEOUS) IMPLANT
GLOVE BIOGEL PI IND STRL 6.5 (GLOVE) IMPLANT
GLOVE BIOGEL PI IND STRL 7.5 (GLOVE) IMPLANT
GLOVE BIOGEL PI INDICATOR 6.5 (GLOVE) ×2
GLOVE BIOGEL PI INDICATOR 7.5 (GLOVE) ×2
GLOVE ECLIPSE 6.5 STRL STRAW (GLOVE) ×1 IMPLANT
GLOVE SS BIOGEL STRL SZ 7 (GLOVE) IMPLANT
GLOVE SS BIOGEL STRL SZ 7.5 (GLOVE) ×1 IMPLANT
GLOVE SUPERSENSE BIOGEL SZ 7 (GLOVE) ×1
GLOVE SUPERSENSE BIOGEL SZ 7.5 (GLOVE) ×1
GOWN STRL NON-REIN LRG LVL3 (GOWN DISPOSABLE) ×4 IMPLANT
KIT BASIN OR (CUSTOM PROCEDURE TRAY) ×2 IMPLANT
KIT ROOM TURNOVER OR (KITS) ×2 IMPLANT
NS IRRIG 1000ML POUR BTL (IV SOLUTION) ×2 IMPLANT
PACK CV ACCESS (CUSTOM PROCEDURE TRAY) ×2 IMPLANT
PAD ARMBOARD 7.5X6 YLW CONV (MISCELLANEOUS) ×4 IMPLANT
SPONGE GAUZE 4X4 12PLY (GAUZE/BANDAGES/DRESSINGS) ×2 IMPLANT
STRIP CLOSURE SKIN 1/2X4 (GAUZE/BANDAGES/DRESSINGS) ×2 IMPLANT
SUT PROLENE 6 0 CC (SUTURE) ×2 IMPLANT
SUT VIC AB 3-0 SH 27 (SUTURE) ×4
SUT VIC AB 3-0 SH 27X BRD (SUTURE) ×1 IMPLANT
TOWEL OR 17X24 6PK STRL BLUE (TOWEL DISPOSABLE) ×2 IMPLANT
TOWEL OR 17X26 10 PK STRL BLUE (TOWEL DISPOSABLE) ×2 IMPLANT
UNDERPAD 30X30 INCONTINENT (UNDERPADS AND DIAPERS) ×2 IMPLANT
WATER STERILE IRR 1000ML POUR (IV SOLUTION) ×2 IMPLANT

## 2011-03-09 NOTE — Progress Notes (Signed)
Did not obtain I-stat pt no longer on dialysis. Pt had normal BUN, Crea, sodium, and potassium during pre admission visit. Attempted to contact Dr. Linna Caprice x 2 was unavailable for direct consultation d/t being involved it pt care will send pt over. OR made aware.

## 2011-03-09 NOTE — Interval H&P Note (Signed)
History and Physical Interval Note:  03/09/2011 9:21 AM  Casey [name]  has presented today for surgery, with the diagnosis of ANEURYSM OF LEFT AVF  The various methods of treatment have been discussed with the patient and family. After consideration of risks, benefits and other options for treatment, the patient has consented to  Procedure(s): ARTERIOVENOUS (AV) FISTULA CREATION as a surgical intervention .  The patients' history has been reviewed, patient examined, no change in status, stable for surgery.  I have reviewed the patients' chart and labs.  Questions were answered to the patient's satisfaction.     Rosetta Posner

## 2011-03-09 NOTE — Anesthesia Preprocedure Evaluation (Addendum)
Anesthesia Evaluation  Patient identified by MRN, date of birth, ID band Patient awake    Reviewed: Allergy & Precautions, H&P , NPO status   History of Anesthesia Complications Negative for: history of anesthetic complications  Airway Mallampati: I  Neck ROM: Full    Dental  (+) Teeth Intact   Pulmonary neg pulmonary ROS,  clear to auscultation        Cardiovascular hypertension, Regular Normal    Neuro/Psych    GI/Hepatic GERD-  ,  Endo/Other  Diabetes mellitus-  Renal/GU Renal diseaseRenal transplant     Musculoskeletal   Abdominal   Peds  Hematology   Anesthesia Other Findings   Reproductive/Obstetrics                           Anesthesia Physical Anesthesia Plan  ASA: III  Anesthesia Plan: General   Post-op Pain Management:    Induction: Intravenous  Airway Management Planned: LMA  Additional Equipment:   Intra-op Plan:   Post-operative Plan: Extubation in OR  Informed Consent: I have reviewed the patients History and Physical, chart, labs and discussed the procedure including the risks, benefits and alternatives for the proposed anesthesia with the patient or authorized representative who has indicated his/her understanding and acceptance.   Dental advisory given  Plan Discussed with: CRNA, Surgeon and Anesthesiologist  Anesthesia Plan Comments:        Anesthesia Quick Evaluation

## 2011-03-09 NOTE — H&P (View-Only) (Signed)
Vascular and Vein Specialist of [facility]   Patient name: Casey [name] MRN: [id number] DOB: 1960/06/15 Sex: male     Reason for referral:  Chief Complaint  Patient presents with  . AVF    Discuss removal of pseudoaneurysm left arm   . ESRD    HISTORY OF PRESENT ILLNESS: The patient is long history of end-stage renal disease with a successful kidney transplant in March of 2011. Up until that time he was being dialyzed via a left upper arm AV fistula that was placed by myself in 2007. He had had some degenerative tear degeneration aneurysmal change. He subsequently occluded his outflow to the fistula. 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  Past Medical History  Diagnosis Date  . Chronic kidney disease 04/2009    Kidney Transplant    History reviewed. No pertinent past surgical history.  History   Social History  . Marital Status: Married    Spouse Name: N/A    Number of Children: N/A  . Years of Education: N/A   Occupational History  . Not on file.   Social History Main Topics  . Smoking status: Never Smoker   . Smokeless tobacco: Not on file  . Alcohol Use:   . Drug Use:   . Sexually Active:    Other Topics Concern  . Not on file   Social History Narrative  . No narrative on file    Family History  Problem Relation Age of Onset  . Diabetes Mother   . Hyperlipidemia Mother   . Cancer Father     Allergies as of 03/03/2011  . (Not on File)    Current Outpatient Prescriptions on File Prior to Visit  Medication Sig Dispense Refill  . amLODipine (NORVASC) 5 MG tablet Take 5 mg by mouth 2 (two) times daily.        Marland Kitchen aspirin 81 MG tablet Take 81 mg by mouth daily.        Marland Kitchen atorvastatin (LIPITOR) 40 MG tablet Take 40 mg by mouth daily.        . cetirizine (ZYRTEC) 5 MG tablet Take 5 mg by mouth daily.         . cloNIDine (CATAPRES) 0.1 MG tablet Take 0.1 mg by mouth every 12 (twelve) hours.        Marland Kitchen glipiZIDE (GLUCOTROL) 5 MG tablet Take 5 mg by mouth every morning.        . labetalol (NORMODYNE) 200 MG tablet Take 200 mg by mouth 2 (two) times daily.        . mycophenolate (CELLCEPT) 250 MG capsule Take 250 mg by mouth every 12 (twelve) hours.        Marland Kitchen omeprazole (PRILOSEC) 20 MG capsule Take 20 mg by mouth daily.        . prednisoLONE 5 MG TABS Take 5 mg by mouth once.        . sulfamethoxazole-trimethoprim (BACTRIM DS) 800-160 MG per tablet Take 1 tablet by mouth 3 (three) times a week.        . tacrolimus (PROGRAF) 1 MG capsule Take 1 mg by mouth 2 (two) times daily.           REVIEW OF SYSTEMS:  Positives indicated with an "X"  CARDIOVASCULAR:  [ ]  chest pain   [ ]   chest pressure   [ ]  palpitations   [ ]  orthopnea   [ ]  dyspnea on exertion   [ ]  claudication   [ ]  rest pain   [ ]  DVT   [ ]  phlebitis PULMONARY:   [ ]  productive cough   [ ]  asthma   [ ]  wheezing NEUROLOGIC:   [ ]  weakness  [ ]  paresthesias  [ ]  aphasia  [ ]  amaurosis  [ ]  dizziness HEMATOLOGIC:   [ ]  bleeding problems   [ ]  clotting disorders MUSCULOSKELETAL:  [ ]  joint pain   [ ]  joint swelling GASTROINTESTINAL: [ ]   blood in stool  [ ]   hematemesis GENITOURINARY:  [ ]   dysuria  [ ]   hematuria PSYCHIATRIC:  [ ]  history of major depression INTEGUMENTARY:  [ ]  rashes  [ ]  ulcers CONSTITUTIONAL:  [ ]  fever   [ ]  chills  PHYSICAL EXAMINATION:  General: The patient is a well-nourished male, in no acute distress. Vital signs are BP 152/89  Pulse 77  Resp 16  Ht 6\' 1"  (1.854 m)  Wt 211 lb (95.709 kg)  BMI 27.84 kg/m2  SpO2 100% Pulmonary: There is a good air exchange bilaterally   Abdomen: Soft and non-tender   Musculoskeletal: There are no major deformities.  There is no significant extremity pain. Neurologic: No focal weakness or paresthesias are detected, Skin: There are no ulcer or rashes  noted. Psychiatric: The patient has normal affect. Pulse status all and he does have 2+ radial pulses bilaterally. 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 There is pulsatile expansile flow but no bruit. I feel that he does have enough of a small outflow to keep his fistula patent.    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 I explained that even if he did have need for hemodialysis in the future that this would not be adequate since it does not have any outflow. He is requesting removal of this. I explained that it would be safe to leave this in place also would be acceptable to remove due to the pain and discomfort associated with the size. He wished to proceed with outpatient surgery for correction and removal of this. Surgery scheduled for 03/09/2011. This would be as an outpatient    [place] [facility] [place] Office: [phone]

## 2011-03-09 NOTE — Anesthesia Procedure Notes (Signed)
Procedure Name: LMA Insertion Date/Time: 03/09/2011 10:03 AM Performed by: Maeola Harman Pre-anesthesia Checklist: Patient identified, Emergency Drugs available, Suction available, Patient being monitored and Timeout performed Patient Re-evaluated:Patient Re-evaluated prior to inductionOxygen Delivery Method: Circle System Utilized Preoxygenation: Pre-oxygenation with 100% oxygen Intubation Type: IV induction Ventilation: Mask ventilation without difficulty LMA: LMA inserted LMA Size: 5.0 Number of attempts: 1 Placement Confirmation: positive ETCO2 and breath sounds checked- equal and bilateral Tube secured with: Tape Dental Injury: Teeth and Oropharynx as per pre-operative assessment

## 2011-03-09 NOTE — Transfer of Care (Signed)
Immediate Anesthesia Transfer of Care Note  Patient: Casey [name]  Procedure(s) Performed:  ARTERIOVENOUS (AV) FISTULA CREATION - RESECTION OF VENOUS ANEURYSM OF LEFT ARM AVF  Patient Location: PACU  Anesthesia Type: General  Level of Consciousness: awake  Airway & Oxygen Therapy: Patient Spontanous Breathing  Post-op Assessment: Report given to PACU RN  Post vital signs: stable  Complications: No apparent anesthesia complications

## 2011-03-09 NOTE — Anesthesia Postprocedure Evaluation (Signed)
  Anesthesia Post-op Note  Patient: Casey [name]  Procedure(s) Performed:  ARTERIOVENOUS (AV) FISTULA CREATION - RESECTION OF VENOUS ANEURYSM OF LEFT ARM AVF  Patient Location: PACU  Anesthesia Type: General  Level of Consciousness: awake  Airway and Oxygen Therapy: Patient Spontanous Breathing  Post-op Pain: mild  Post-op Assessment: Post-op Vital signs reviewed  Post-op Vital Signs: stable  Complications: No apparent anesthesia complications

## 2011-03-09 NOTE — Op Note (Signed)
OPERATIVE REPORT  DATE OF SURGERY: 03/09/2011  PATIENT: Casey [name], 51 y.o. male MRN: [id number]  DOB: [birth year]  PRE-OPERATIVE DIAGNOSIS: venous aneurysm nonfunctional left upper arm AV fistula  POST-OPERATIVE DIAGNOSIS:  Same  PROCEDURE: ligation of left upper arm AV fistula and resection of venous aneurysm  SURGEON:  Curt Jews, M.D.  PHYSICIAN ASSISTANT: Collins PA  ANESTHESIA:  lma  EBL: minimal ml  Total I/O In: 800 [I.V.:800] Out: 10 [Blood:10]  BLOOD ADMINISTERED: none  DRAINS: none  SPECIMEN: none  COUNTS CORRECT:  YES  PLAN OF CARE: PACU   PATIENT DISPOSITION:  PACU - hemodynamically stable  PROCEDURE DETAILS: The patient was taken to the operating room placed supine position where the area of the left thumb triggering sterile fashion incision was made in the antecubital scar and carried down to isolate the cephalic vein to brachial artery anastomosis. The vein was occluded at the brachial artery anastomosis and then also distally. The vein was divided near the brachial artery anastomosis and the vein was oversewn with 2 layers of 6-0 Prolene suture. Next in an elliptical type incision was made over the arch aneurysm on the above elbow position. The old scar was excised through this elliptical incision. A large venous aneurysm was dissected free and was dissected proximal distal to the incision. The vein was divided proximally and was chronically occluded. The venous aneurysm was resected entirely and passed off the field. It was irrigated with saline excessive scarring was closed with layers of 3-0 Vicryl the subcutaneous tissue in the upper arm area skin was closed with 3-0 subcuticular Vicryl stitch. Next the antecubital incision was closed with 2 layers of 3-0 Vicryl sutures well. A sterile dressing and Ace wrap was applied. Patient transferred to the recovery room in stable condition   Curt Jews, M.D. 03/09/2011 11:35 AM

## 2011-03-10 ENCOUNTER — Encounter (HOSPITAL_COMMUNITY): Payer: Self-pay | Admitting: Vascular Surgery

## 2011-03-11 ENCOUNTER — Telehealth (HOSPITAL_COMMUNITY): Payer: Self-pay

## 2011-03-23 ENCOUNTER — Encounter: Payer: Self-pay | Admitting: Vascular Surgery

## 2011-03-24 ENCOUNTER — Encounter: Payer: Self-pay | Admitting: Vascular Surgery

## 2011-03-24 ENCOUNTER — Ambulatory Visit (INDEPENDENT_AMBULATORY_CARE_PROVIDER_SITE_OTHER): Payer: BC Managed Care – PPO | Admitting: Vascular Surgery

## 2011-03-24 VITALS — BP 135/88 | HR 97 | Resp 20 | Ht 73.0 in | Wt 214.0 lb

## 2011-03-24 DIAGNOSIS — N186 End stage renal disease: Secondary | ICD-10-CM

## 2011-03-24 NOTE — Progress Notes (Signed)
The patient has today for followup of resection of a large venous aneurysm in his left upper arm AV fistula on 03/09/2011. He has done well with this outpatient procedure with minimal discomfort following this.  On physical exam his aneurysm was resected and a large area of keloid was also excised. He has an excellent result with her removal of the large uncomfortable venous aneurysm in his antecubital space and this above elbow position. The wounds are healing quite nicely. He does have a palpable left radial pulse.  Impression and plan: Satisfactory recovery following resection of large venous aneurysm left upper arm. He will see [place] again on an as-needed basis. He has no restrictions as far as activity

## 2013-03-15 ENCOUNTER — Encounter (HOSPITAL_COMMUNITY): Payer: Self-pay | Admitting: Emergency Medicine

## 2013-03-15 ENCOUNTER — Emergency Department (HOSPITAL_COMMUNITY)
Admission: EM | Admit: 2013-03-15 | Discharge: 2013-03-16 | Disposition: A | Discharge status: Home / Self Care | Payer: Worker's Compensation | Attending: Emergency Medicine | Admitting: Emergency Medicine

## 2013-03-15 ENCOUNTER — Emergency Department (HOSPITAL_COMMUNITY): Payer: Worker's Compensation

## 2013-03-15 DIAGNOSIS — Z992 Dependence on renal dialysis: Secondary | ICD-10-CM | POA: Diagnosis not present

## 2013-03-15 DIAGNOSIS — S058X9A Other injuries of unspecified eye and orbit, initial encounter: Secondary | ICD-10-CM | POA: Diagnosis not present

## 2013-03-15 DIAGNOSIS — S0120XA Unspecified open wound of nose, initial encounter: Secondary | ICD-10-CM | POA: Diagnosis not present

## 2013-03-15 DIAGNOSIS — E119 Type 2 diabetes mellitus without complications: Secondary | ICD-10-CM | POA: Insufficient documentation

## 2013-03-15 DIAGNOSIS — Z940 Kidney transplant status: Secondary | ICD-10-CM | POA: Insufficient documentation

## 2013-03-15 DIAGNOSIS — S01409A Unspecified open wound of unspecified cheek and temporomandibular area, initial encounter: Secondary | ICD-10-CM | POA: Insufficient documentation

## 2013-03-15 DIAGNOSIS — N186 End stage renal disease: Secondary | ICD-10-CM | POA: Insufficient documentation

## 2013-03-15 DIAGNOSIS — S8000XA Contusion of unspecified knee, initial encounter: Secondary | ICD-10-CM | POA: Insufficient documentation

## 2013-03-15 DIAGNOSIS — S0990XA Unspecified injury of head, initial encounter: Secondary | ICD-10-CM | POA: Diagnosis not present

## 2013-03-15 DIAGNOSIS — I120 Hypertensive chronic kidney disease with stage 5 chronic kidney disease or end stage renal disease: Secondary | ICD-10-CM | POA: Diagnosis not present

## 2013-03-15 DIAGNOSIS — Z79899 Other long term (current) drug therapy: Secondary | ICD-10-CM | POA: Diagnosis not present

## 2013-03-15 DIAGNOSIS — IMO0002 Reserved for concepts with insufficient information to code with codable children: Secondary | ICD-10-CM | POA: Insufficient documentation

## 2013-03-15 DIAGNOSIS — S0502XA Injury of conjunctiva and corneal abrasion without foreign body, left eye, initial encounter: Secondary | ICD-10-CM

## 2013-03-15 DIAGNOSIS — S0292XA Unspecified fracture of facial bones, initial encounter for closed fracture: Secondary | ICD-10-CM

## 2013-03-15 DIAGNOSIS — K219 Gastro-esophageal reflux disease without esophagitis: Secondary | ICD-10-CM | POA: Diagnosis not present

## 2013-03-15 DIAGNOSIS — S0280XA Fracture of other specified skull and facial bones, unspecified side, initial encounter for closed fracture: Secondary | ICD-10-CM | POA: Insufficient documentation

## 2013-03-15 DIAGNOSIS — H11429 Conjunctival edema, unspecified eye: Secondary | ICD-10-CM | POA: Insufficient documentation

## 2013-03-15 DIAGNOSIS — S0993XA Unspecified injury of face, initial encounter: Secondary | ICD-10-CM | POA: Diagnosis present

## 2013-03-15 MED ORDER — TETRACAINE HCL 0.5 % OP SOLN
1.0000 [drp] | Freq: Once | OPHTHALMIC | Status: AC
  Administered 2013-03-15: 1 [drp] via OPHTHALMIC
  Filled 2013-03-15: qty 2

## 2013-03-15 MED ORDER — FLUORESCEIN SODIUM 1 MG OP STRP
1.0000 | ORAL_STRIP | Freq: Once | OPHTHALMIC | Status: AC
  Administered 2013-03-15: 1 via OPHTHALMIC
  Filled 2013-03-15: qty 1

## 2013-03-15 MED ORDER — OXYCODONE-ACETAMINOPHEN 5-325 MG PO TABS
2.0000 | ORAL_TABLET | Freq: Once | ORAL | Status: AC
  Administered 2013-03-15: 2 via ORAL
  Filled 2013-03-15: qty 2
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
## 2013-03-15 NOTE — ED Notes (Signed)
Patients face cleaned, wounds identified, dressing to left face under his eye.  Left eye swollen shut, pupil non reactive per PA

## 2013-03-15 NOTE — ED Notes (Signed)
Back from CT

## 2013-03-16 DIAGNOSIS — S0280XA Fracture of other specified skull and facial bones, unspecified side, initial encounter for closed fracture: Secondary | ICD-10-CM | POA: Diagnosis not present

## 2013-03-16 MED ORDER — ONDANSETRON 4 MG PO TBDP
ORAL_TABLET | ORAL | Status: AC
  Filled 2013-03-16: qty 2

## 2013-03-16 MED ORDER — ONDANSETRON HCL 4 MG PO TABS: 4.0000 mg | ORAL_TABLET | Freq: Four times a day (QID) | ORAL | Status: DC

## 2013-03-16 MED ORDER — ERYTHROMYCIN 5 MG/GM OP OINT
TOPICAL_OINTMENT | Freq: Once | OPHTHALMIC | Status: AC
  Administered 2013-03-16: 01:00:00 via OPHTHALMIC
  Filled 2013-03-16: qty 1

## 2013-03-16 MED ORDER — AMOXICILLIN-POT CLAVULANATE 875-125 MG PO TABS: 1.0000 | ORAL_TABLET | Freq: Two times a day (BID) | ORAL | Status: DC

## 2013-03-16 MED ORDER — OXYCODONE HCL 5 MG PO CAPS: 5.0000 mg | ORAL_CAPSULE | ORAL | Status: DC | PRN

## 2013-03-16 MED ORDER — ONDANSETRON 4 MG PO TBDP
8.0000 mg | ORAL_TABLET | Freq: Once | ORAL | Status: AC
  Administered 2013-03-16: 8 mg via ORAL
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
## 2013-12-01 ENCOUNTER — Other Ambulatory Visit: Payer: Self-pay

## 2015-05-19 ENCOUNTER — Emergency Department: Payer: BC Managed Care – PPO

## 2015-05-19 ENCOUNTER — Encounter: Payer: Self-pay | Admitting: Emergency Medicine

## 2015-05-19 ENCOUNTER — Emergency Department
Admission: EM | Admit: 2015-05-19 | Discharge: 2015-05-19 | Disposition: A | Discharge status: Home / Self Care | Payer: BC Managed Care – PPO | Attending: Emergency Medicine | Admitting: Emergency Medicine

## 2015-05-19 DIAGNOSIS — T82898A Other specified complication of vascular prosthetic devices, implants and grafts, initial encounter: Secondary | ICD-10-CM | POA: Insufficient documentation

## 2015-05-19 DIAGNOSIS — Z7984 Long term (current) use of oral hypoglycemic drugs: Secondary | ICD-10-CM | POA: Insufficient documentation

## 2015-05-19 DIAGNOSIS — Z792 Long term (current) use of antibiotics: Secondary | ICD-10-CM | POA: Diagnosis not present

## 2015-05-19 DIAGNOSIS — I729 Aneurysm of unspecified site: Secondary | ICD-10-CM | POA: Insufficient documentation

## 2015-05-19 DIAGNOSIS — N186 End stage renal disease: Secondary | ICD-10-CM | POA: Diagnosis not present

## 2015-05-19 DIAGNOSIS — Z940 Kidney transplant status: Secondary | ICD-10-CM | POA: Diagnosis not present

## 2015-05-19 DIAGNOSIS — E1122 Type 2 diabetes mellitus with diabetic chronic kidney disease: Secondary | ICD-10-CM | POA: Diagnosis not present

## 2015-05-19 DIAGNOSIS — K219 Gastro-esophageal reflux disease without esophagitis: Secondary | ICD-10-CM | POA: Diagnosis not present

## 2015-05-19 DIAGNOSIS — Z79899 Other long term (current) drug therapy: Secondary | ICD-10-CM | POA: Diagnosis not present

## 2015-05-19 DIAGNOSIS — R531 Weakness: Secondary | ICD-10-CM | POA: Diagnosis present

## 2015-05-19 DIAGNOSIS — R5383 Other fatigue: Secondary | ICD-10-CM | POA: Insufficient documentation

## 2015-05-19 DIAGNOSIS — Z7952 Long term (current) use of systemic steroids: Secondary | ICD-10-CM | POA: Insufficient documentation

## 2015-05-19 DIAGNOSIS — N390 Urinary tract infection, site not specified: Secondary | ICD-10-CM

## 2015-05-19 DIAGNOSIS — I120 Hypertensive chronic kidney disease with stage 5 chronic kidney disease or end stage renal disease: Secondary | ICD-10-CM | POA: Diagnosis not present

## 2015-05-19 DIAGNOSIS — Z992 Dependence on renal dialysis: Secondary | ICD-10-CM | POA: Diagnosis not present

## 2015-05-19 DIAGNOSIS — Y69 Unspecified misadventure during surgical and medical care: Secondary | ICD-10-CM | POA: Diagnosis not present

## 2015-05-19 LAB — CBC
HCT: 40.8 % (ref 40.0–52.0)
Hemoglobin: 13.7 g/dL (ref 13.0–18.0)
MCH: 27.4 pg (ref 26.0–34.0)
MCHC: 33.5 g/dL (ref 32.0–36.0)
MCV: 81.8 fL (ref 80.0–100.0)
PLATELETS: 241 10*3/uL (ref 150–440)
RBC: 4.99 MIL/uL (ref 4.40–5.90)
RDW: 14.8 % — AB (ref 11.5–14.5)
WBC: 15.1 10*3/uL — ABNORMAL HIGH (ref 3.8–10.6)

## 2015-05-19 LAB — BASIC METABOLIC PANEL
Anion gap: 8 (ref 5–15)
BUN: 19 mg/dL (ref 6–20)
CO2: 23 mmol/L (ref 22–32)
CREATININE: 2.06 mg/dL — AB (ref 0.61–1.24)
Calcium: 9.8 mg/dL (ref 8.9–10.3)
Chloride: 100 mmol/L — ABNORMAL LOW (ref 101–111)
GFR calc Af Amer: 40 mL/min — ABNORMAL LOW (ref >60)
GFR calc non Af Amer: 35 mL/min — ABNORMAL LOW (ref >60)
GLUCOSE: 222 mg/dL — AB (ref 65–99)
Potassium: 4 mmol/L (ref 3.5–5.1)
Sodium: 131 mmol/L — ABNORMAL LOW (ref 135–145)

## 2015-05-19 LAB — URINALYSIS COMPLETE WITH MICROSCOPIC (ARMC ONLY)
Bilirubin Urine: NEGATIVE
GLUCOSE, UA: NEGATIVE mg/dL
Nitrite: POSITIVE — AB
Protein, ur: 100 mg/dL — AB
SPECIFIC GRAVITY, URINE: 1.013 (ref 1.005–1.030)
Squamous Epithelial / LPF: NONE SEEN
pH: 6 (ref 5.0–8.0)

## 2015-05-19 LAB — RAPID INFLUENZA A&B ANTIGENS
Influenza A (ARMC): NEGATIVE
Influenza B (ARMC): NEGATIVE

## 2015-05-19 LAB — TROPONIN I

## 2015-05-19 LAB — LACTIC ACID, PLASMA: Lactic Acid, Venous: 1.4 mmol/L (ref 0.5–2.0)

## 2015-05-19 MED ORDER — SODIUM CHLORIDE 0.9 % IV BOLUS (SEPSIS)
500.0000 mL | Freq: Once | INTRAVENOUS | Status: AC
  Administered 2015-05-19: 500 mL via INTRAVENOUS

## 2015-05-19 MED ORDER — ACETAMINOPHEN 325 MG PO TABS
650.0000 mg | ORAL_TABLET | Freq: Once | ORAL | Status: AC
  Administered 2015-05-19: 650 mg via ORAL
  Filled 2015-05-19: qty 2

## 2015-05-19 MED ORDER — CIPROFLOXACIN HCL 500 MG PO TABS: 500.0000 mg | ORAL_TABLET | Freq: Two times a day (BID) | ORAL | Status: AC

## 2015-05-19 MED ORDER — DEXTROSE 5 % IV SOLN
1.0000 g | Freq: Once | INTRAVENOUS | Status: AC
  Administered 2015-05-19: 1 g via INTRAVENOUS
  Filled 2015-05-19: qty 10

## 2015-05-19 NOTE — ED Notes (Signed)
Pt transported to xray 

## 2015-05-19 NOTE — Discharge Instructions (Signed)
Urinary Tract Infection Urinary tract infections (UTIs) can develop anywhere along your urinary tract. Your urinary tract is your body's drainage system for removing wastes and extra water. Your urinary tract includes two kidneys, two ureters, a bladder, and a urethra. Your kidneys are a pair of bean-shaped organs. Each kidney is about the size of your fist. They are located below your ribs, one on each side of your spine. CAUSES Infections are caused by microbes, which are microscopic organisms, including fungi, viruses, and bacteria. These organisms are so small that they can only be seen through a microscope. Bacteria are the microbes that most commonly cause UTIs. SYMPTOMS  Symptoms of UTIs may vary by age and gender of the patient and by the location of the infection. Symptoms in young women typically include a frequent and intense urge to urinate and a painful, burning feeling in the bladder or urethra during urination. Older women and men are more likely to be tired, shaky, and weak and have muscle aches and abdominal pain. A fever may mean the infection is in your kidneys. Other symptoms of a kidney infection include pain in your back or sides below the ribs, nausea, and vomiting. DIAGNOSIS To diagnose a UTI, your caregiver will ask you about your symptoms. Your caregiver will also ask you to provide a urine sample. The urine sample will be tested for bacteria and white blood cells. White blood cells are made by your body to help fight infection. TREATMENT  Typically, UTIs can be treated with medication. Because most UTIs are caused by a bacterial infection, they usually can be treated with the use of antibiotics. The choice of antibiotic and length of treatment depend on your symptoms and the type of bacteria causing your infection. HOME CARE INSTRUCTIONS  If you were prescribed antibiotics, take them exactly as your caregiver instructs you. Finish the medication even if you feel better after  you have only taken some of the medication.  Drink enough water and fluids to keep your urine clear or pale yellow.  Avoid caffeine, tea, and carbonated beverages. They tend to irritate your bladder.  Empty your bladder often. Avoid holding urine for long periods of time.  Empty your bladder before and after sexual intercourse.  After a bowel movement, women should cleanse from front to back. Use each tissue only once. SEEK MEDICAL CARE IF:   You have back pain.  You develop a fever.  Your symptoms do not begin to resolve within 3 days. SEEK IMMEDIATE MEDICAL CARE IF:   You have severe back pain or lower abdominal pain.  You develop chills.  You have nausea or vomiting.  You have continued burning or discomfort with urination. MAKE SURE YOU:   Understand these instructions.  Will watch your condition.  Will get help right away if you are not doing well or get worse.   This information is not intended to replace advice given to you by your health care provider. Make sure you discuss any questions you have with your health care provider.   Document Released: 11/12/2004 Document Revised: 10/24/2014 Document Reviewed: 03/13/2011 Elsevier Interactive Patient Education [employer].   Urine culture is pending.

## 2015-05-19 NOTE — ED Provider Notes (Signed)
Time Seen: Approximately 1600  I have reviewed the triage notes  Chief Complaint: Weakness and Fatigue   History of Present Illness: Casey [name]. is a 55 y.o. male who is status post renal transplant 6 years ago. Patient's currently still on immunosuppressive therapy. Patient states he started feeling bad on Thursday with some generalized weakness and fatigue. Temperatures at home up to 101. Patient's had a runny nose dry nonproductive cough. He denies any shortness of breath. His wife states that he had a GI bug 2 weeks ago with nausea vomiting and diarrhea. He states he may feel a little constipated at this point. He denies any significant headaches Stiffness or photophobia. He denies any tenderness over his transplant site. He denies any dysuria, hematuria, urinary frequency. He is not aware of any unusual rashes or lesions.   Past Medical History  Diagnosis Date  . Hypertension   . Diabetes mellitus   . Chronic kidney disease 04/2009    Kidney Transplant  . GERD (gastroesophageal reflux disease)     as needed reflux    Patient Active Problem List   Diagnosis Date Noted  . End stage renal disease ([place]) 03/03/2011  . Aneurysm of unspecified site ([place]) 03/03/2011  . Other complications due to renal dialysis device, implant, and graft 02/13/2011  . ESRD (end stage renal disease) ([place]) 02/13/2011    Past Surgical History  Procedure Laterality Date  . Kidney transplant  2011  . Insertion of dialysis catheter      cordis dialysis catheter placement  . Dialysis fistula creation      last used 04/2009  . 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 Kidney transplant  2011  . Insertion of dialysis catheter      cordis dialysis catheter placement  .  Dialysis fistula creation      last used 04/2009  . 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 amLODipine (NORVASC) 5 MG tablet   Oral   Take 10 mg by mouth daily.          Marland Kitchen amoxicillin-clavulanate (AUGMENTIN) 875-125 MG per tablet   Oral   Take 1 tablet by mouth 2 (two) times daily.   14 tablet   0   . atorvastatin (LIPITOR) 40 MG tablet   Oral   Take 40 mg by mouth daily.           . cetirizine (ZYRTEC) 5 MG tablet   Oral   Take 5 mg by mouth daily as needed. For allergies         . cloNIDine (CATAPRES) 0.1 MG tablet   Oral   Take 0.2 mg by mouth every 12 (twelve) hours.          Marland Kitchen glipiZIDE (GLUCOTROL) 5 MG tablet   Oral   Take 10 mg by mouth every morning.          . labetalol (NORMODYNE) 200 MG tablet   Oral   Take  400 mg by mouth 2 (two) times daily.          . mycophenolate (CELLCEPT) 250 MG capsule   Oral   Take 1,000 mg by mouth every 12 (twelve) hours.          Marland Kitchen omeprazole (PRILOSEC) 20 MG capsule   Oral   Take 20 mg by mouth daily as needed. For acid reflux         . ondansetron (ZOFRAN) 4 MG tablet   Oral   Take 1 tablet (4 mg total) by mouth every 6 (six) hours.   12 tablet   0   . oxycodone (OXY-IR) 5 MG capsule   Oral   Take 1 capsule (5 mg total) by mouth every 4 (four) hours as needed.   30 capsule   0   . predniSONE (DELTASONE) 5 MG tablet   Oral   Take 5 mg by mouth Daily.         . tacrolimus (PROGRAF) 1 MG capsule   Oral   Take 3-4 mg by mouth 2 (two) times daily. Take 3 capsules in the morning and 4 capsules in the evening           Allergies:  Review of patient's allergies indicates no known allergies.  Family History: Family History  Problem Relation Age of Onset  .  Diabetes Mother   . Hyperlipidemia Mother   . Cancer Father     Social History: Social History  Substance Use Topics  . Smoking status: Never Smoker   . Smokeless tobacco: Never Used  . Alcohol Use: Yes     Comment: occassi     Review of Systems:   10 point review of systems was performed and was otherwise negative:  Constitutional: No fever Eyes: No visual disturbances ENT: No sore throat, ear pain Cardiac: No chest pain Respiratory: No shortness of breath, wheezing, or stridor Abdomen: No abdominal pain, no vomiting, No diarrhea Endocrine: No weight loss, No night sweats Extremities: No peripheral edema, cyanosis Skin: No rashes, easy bruising Neurologic: No focal weakness, trouble with speech or swollowing Urologic: No dysuria, Hematuria, or urinary frequency   Physical Exam:  ED Triage Vitals  Enc Vitals Group     BP 05/19/15 1515 141/73 mmHg     Pulse Rate 05/19/15 1515 123     Resp 05/19/15 1515 18     Temp 05/19/15 1515 99.8 F (37.7 C)     Temp Source 05/19/15 1515 Oral     SpO2 05/19/15 1515 98 %     Weight 05/19/15 1515 204 lb (92.534 kg)     Height 05/19/15 1515 6\' 1"  (1.854 m)     Head Cir --      Peak Flow --      Pain Score --      Pain Loc --      Pain Edu? --      Excl. in [place]? --     General: Awake , Alert , and Oriented times 3; GCS 15 Head: Normal cephalic , atraumatic Eyes: Pupils equal , round, reactive to light Nose/Throat: No nasal drainage, patent upper airway without erythema or exudate.  Neck: Supple, Full range of motion, No anterior adenopathy or palpable thyroid masses Lungs: Clear to ascultation without wheezes , rhonchi, or rales Heart: Regular rate, regular rhythm without murmurs , gallops , or rubs Abdomen: Soft, non tender without rebound, guarding , or rigidity; bowel sounds positive and symmetric in all 4 quadrants. No  organomegaly .        Extremities: 2 plus symmetric pulses. No edema, clubbing or cyanosis Neurologic:  normal ambulation, Motor symmetric without deficits, sensory intact Skin: warm, dry, no rashes   Labs:   All laboratory work was reviewed including any pertinent negatives or positives listed below:  Labs Reviewed  BASIC METABOLIC PANEL - Abnormal; Notable for the following:    Sodium 131 (*)    Chloride 100 (*)    Glucose, Bld 222 (*)    Creatinine, Ser 2.06 (*)    GFR calc non Af Amer 35 (*)    GFR calc Af Amer 40 (*)    All other components within normal limits  CBC - Abnormal; Notable for the following:    WBC 15.1 (*)    RDW 14.8 (*)    All other components within normal limits  RAPID INFLUENZA A&B ANTIGENS ([facility] ONLY)  CULTURE, BLOOD (ROUTINE X 2)  CULTURE, BLOOD (ROUTINE X 2)  TROPONIN I  URINALYSIS COMPLETEWITH MICROSCOPIC ([facility] ONLY)  LACTIC ACID, PLASMA  LACTIC ACID, PLASMA   Review of the laboratory work showed concerns for urinary tract infection and elevated white blood cell count.  Blood and urine cultures are pending EKG: ED ECG REPORT I, Daymon Larsen, the attending physician, personally viewed and interpreted this ECG.  Date: 05/19/2015 EKG Time: *1525 Rate: 111 Rhythm: Sinus tachycardia QRS Axis: normal Intervals: normal ST/T Wave abnormalities: normal Conduction Disturbances: none Narrative Interpretation: unremarkable Normal EKG   Radiology: Narrative:    CLINICAL DATA: Fever with cough for 3 days, constipation for 5 days.  EXAM: DG ABDOMEN ACUTE W/ 1V CHEST  COMPARISON: Chest x-ray dated 03/06/2011.  FINDINGS: Single view of the chest: Heart size is normal. Overall cardiomediastinal silhouette is stable in size and configuration. Lungs are clear. Slight elevation of the right hemidiaphragm is stable. Osseous structures about the chest are unremarkable. Left subclavian stent appears stable in position.  Supine and upright views of the abdomen: Bowel gas pattern is nonobstructive. No evidence of soft tissue mass or abnormal  fluid collection. No evidence of free intraperitoneal air. Osseous structures of the abdomen and pelvis are unremarkable.  IMPRESSION: 1. Lungs are clear and there is no evidence of acute cardiopulmonary abnormality. 2. Nonobstructive bowel gas pattern and no evidence of acute intra-abdominal abnormality.   Electronically Signed By: Franki Cabot M.D. On: 05/19/2015 16:38       I personally reviewed the radiologic studies    ED Course: * Patient's stay here was uneventful. He was initiated on Tylenol and a fluid bolus for his fever. The patient has a significant history of being immunocompromised from previous renal transplant. His source for his fever appears to be a urinary tract infection. Urine culture and blood cultures 2 are pending. Patient was initiated on IV Rocephin and later discharged with a prescription for Cipro. Patient's renal function seems to be adequate and I reviewed the case with the transplant coordinator nurse on call at [facility] they would like to culture results to be faxed to them if possible at [phone] Patient was advised to return here if he has any other concerns and was advised touch base with his own transplant coordinator in the morning to arrange follow-up and repeat exam.    Assessment: * Fever and immunocompromise patient Acute urinary tract infection      Plan: Outpatient Patient was advised to return immediately if condition worsens. Patient was advised to follow up with their primary care  physician or other specialized physicians involved in their outpatient care. The patient and/or family member/power of attorney had laboratory results reviewed at the bedside. All questions and concerns were addressed and appropriate discharge instructions were distributed by the nursing staff.            Daymon Larsen, MD 05/19/15 2016

## 2015-05-19 NOTE — ED Notes (Signed)
Pt has had general weakness for 3 days.  Wife reports has laid in bed since Friday.  Fevers up to 101 per pt.  Denies pain.  Pt c/o constipation feeling near rectum.  Had vomiting and diarrhea 2 weeks ago but that had went away.

## 2015-05-19 NOTE — ED Notes (Signed)
Assisted patient to use urinal at bedside. Patient steady on feet with no assistance.

## 2015-05-22 LAB — URINE CULTURE: Culture: >=100000 — AB

## 2015-05-24 LAB — CULTURE, BLOOD (ROUTINE X 2)
CULTURE: NO GROWTH
Culture: NO GROWTH

## 2017-06-24 ENCOUNTER — Other Ambulatory Visit: Payer: Self-pay | Admitting: Nephrology

## 2017-06-24 DIAGNOSIS — N189 Chronic kidney disease, unspecified: Principal | ICD-10-CM

## 2017-06-24 DIAGNOSIS — N179 Acute kidney failure, unspecified: Secondary | ICD-10-CM

## 2017-06-24 DIAGNOSIS — I129 Hypertensive chronic kidney disease with stage 1 through stage 4 chronic kidney disease, or unspecified chronic kidney disease: Secondary | ICD-10-CM

## 2017-07-05 ENCOUNTER — Other Ambulatory Visit: Payer: Self-pay | Admitting: Nephrology

## 2017-07-05 ENCOUNTER — Ambulatory Visit
Admission: RE | Admit: 2017-07-05 | Discharge: 2017-07-05 | Disposition: A | Discharge status: Home / Self Care | Payer: BC Managed Care – PPO | Source: Ambulatory Visit | Attending: Nephrology | Admitting: Nephrology

## 2017-07-05 DIAGNOSIS — N189 Chronic kidney disease, unspecified: Principal | ICD-10-CM

## 2017-07-05 DIAGNOSIS — N179 Acute kidney failure, unspecified: Secondary | ICD-10-CM

## 2017-07-05 DIAGNOSIS — I129 Hypertensive chronic kidney disease with stage 1 through stage 4 chronic kidney disease, or unspecified chronic kidney disease: Secondary | ICD-10-CM

## 2019-12-03 ENCOUNTER — Encounter
Admission: EM | Disposition: A | Discharge status: Other Acute Inpatient Hospital | Payer: Self-pay | Source: Home / Self Care | Attending: Obstetrics and Gynecology

## 2019-12-03 ENCOUNTER — Inpatient Hospital Stay
Admission: EM | Admit: 2019-12-03 | Discharge: 2019-12-05 | DRG: 246 | Disposition: A | Discharge status: Other Acute Inpatient Hospital | Payer: BC Managed Care – PPO | Attending: Obstetrics and Gynecology | Admitting: Obstetrics and Gynecology

## 2019-12-03 ENCOUNTER — Other Ambulatory Visit: Payer: Self-pay

## 2019-12-03 ENCOUNTER — Emergency Department: Payer: BC Managed Care – PPO

## 2019-12-03 DIAGNOSIS — I255 Ischemic cardiomyopathy: Secondary | ICD-10-CM | POA: Diagnosis present

## 2019-12-03 DIAGNOSIS — I959 Hypotension, unspecified: Secondary | ICD-10-CM | POA: Diagnosis not present

## 2019-12-03 DIAGNOSIS — Z79899 Other long term (current) drug therapy: Secondary | ICD-10-CM

## 2019-12-03 DIAGNOSIS — Z83438 Family history of other disorder of lipoprotein metabolism and other lipidemia: Secondary | ICD-10-CM

## 2019-12-03 DIAGNOSIS — N183 Chronic kidney disease, stage 3 unspecified: Secondary | ICD-10-CM | POA: Diagnosis present

## 2019-12-03 DIAGNOSIS — I2102 ST elevation (STEMI) myocardial infarction involving left anterior descending coronary artery: Secondary | ICD-10-CM | POA: Diagnosis present

## 2019-12-03 DIAGNOSIS — N179 Acute kidney failure, unspecified: Secondary | ICD-10-CM | POA: Diagnosis not present

## 2019-12-03 DIAGNOSIS — I2510 Atherosclerotic heart disease of native coronary artery without angina pectoris: Secondary | ICD-10-CM | POA: Diagnosis present

## 2019-12-03 DIAGNOSIS — Z833 Family history of diabetes mellitus: Secondary | ICD-10-CM | POA: Diagnosis not present

## 2019-12-03 DIAGNOSIS — N1831 Chronic kidney disease, stage 3a: Secondary | ICD-10-CM | POA: Diagnosis present

## 2019-12-03 DIAGNOSIS — E1122 Type 2 diabetes mellitus with diabetic chronic kidney disease: Secondary | ICD-10-CM | POA: Diagnosis present

## 2019-12-03 DIAGNOSIS — Z7984 Long term (current) use of oral hypoglycemic drugs: Secondary | ICD-10-CM | POA: Diagnosis not present

## 2019-12-03 DIAGNOSIS — D631 Anemia in chronic kidney disease: Secondary | ICD-10-CM | POA: Diagnosis present

## 2019-12-03 DIAGNOSIS — I358 Other nonrheumatic aortic valve disorders: Secondary | ICD-10-CM | POA: Diagnosis present

## 2019-12-03 DIAGNOSIS — R570 Cardiogenic shock: Secondary | ICD-10-CM | POA: Diagnosis not present

## 2019-12-03 DIAGNOSIS — N189 Chronic kidney disease, unspecified: Secondary | ICD-10-CM

## 2019-12-03 DIAGNOSIS — K560 Paralytic ileus: Secondary | ICD-10-CM | POA: Diagnosis not present

## 2019-12-03 DIAGNOSIS — N170 Acute kidney failure with tubular necrosis: Secondary | ICD-10-CM | POA: Diagnosis present

## 2019-12-03 DIAGNOSIS — R079 Chest pain, unspecified: Secondary | ICD-10-CM | POA: Diagnosis not present

## 2019-12-03 DIAGNOSIS — I132 Hypertensive heart and chronic kidney disease with heart failure and with stage 5 chronic kidney disease, or end stage renal disease: Secondary | ICD-10-CM | POA: Diagnosis present

## 2019-12-03 DIAGNOSIS — Z940 Kidney transplant status: Secondary | ICD-10-CM

## 2019-12-03 DIAGNOSIS — I513 Intracardiac thrombosis, not elsewhere classified: Secondary | ICD-10-CM | POA: Diagnosis present

## 2019-12-03 DIAGNOSIS — Z7952 Long term (current) use of systemic steroids: Secondary | ICD-10-CM

## 2019-12-03 DIAGNOSIS — T8619 Other complication of kidney transplant: Secondary | ICD-10-CM | POA: Diagnosis present

## 2019-12-03 DIAGNOSIS — Z20822 Contact with and (suspected) exposure to covid-19: Secondary | ICD-10-CM | POA: Diagnosis present

## 2019-12-03 DIAGNOSIS — Y830 Surgical operation with transplant of whole organ as the cause of abnormal reaction of the patient, or of later complication, without mention of misadventure at the time of the procedure: Secondary | ICD-10-CM | POA: Diagnosis present

## 2019-12-03 DIAGNOSIS — K219 Gastro-esophageal reflux disease without esophagitis: Secondary | ICD-10-CM | POA: Diagnosis present

## 2019-12-03 DIAGNOSIS — R7401 Elevation of levels of liver transaminase levels: Secondary | ICD-10-CM | POA: Diagnosis present

## 2019-12-03 DIAGNOSIS — E785 Hyperlipidemia, unspecified: Secondary | ICD-10-CM | POA: Diagnosis present

## 2019-12-03 DIAGNOSIS — I5021 Acute systolic (congestive) heart failure: Secondary | ICD-10-CM | POA: Diagnosis present

## 2019-12-03 DIAGNOSIS — R140 Abdominal distension (gaseous): Secondary | ICD-10-CM

## 2019-12-03 DIAGNOSIS — F439 Reaction to severe stress, unspecified: Secondary | ICD-10-CM | POA: Diagnosis present

## 2019-12-03 DIAGNOSIS — E1169 Type 2 diabetes mellitus with other specified complication: Secondary | ICD-10-CM | POA: Diagnosis present

## 2019-12-03 DIAGNOSIS — I213 ST elevation (STEMI) myocardial infarction of unspecified site: Secondary | ICD-10-CM | POA: Diagnosis not present

## 2019-12-03 DIAGNOSIS — I10 Essential (primary) hypertension: Secondary | ICD-10-CM | POA: Diagnosis not present

## 2019-12-03 DIAGNOSIS — I252 Old myocardial infarction: Secondary | ICD-10-CM | POA: Diagnosis present

## 2019-12-03 DIAGNOSIS — H5702 Anisocoria: Secondary | ICD-10-CM | POA: Diagnosis not present

## 2019-12-03 HISTORY — PX: CORONARY/GRAFT ACUTE MI REVASCULARIZATION: CATH118305

## 2019-12-03 HISTORY — PX: LEFT HEART CATH AND CORONARY ANGIOGRAPHY: CATH118249

## 2019-12-03 LAB — CBC
HCT: 52.1 % — ABNORMAL HIGH (ref 39.0–52.0)
Hemoglobin: 17.4 g/dL — ABNORMAL HIGH (ref 13.0–17.0)
MCH: 28.1 pg (ref 26.0–34.0)
MCHC: 33.4 g/dL (ref 30.0–36.0)
MCV: 84.2 fL (ref 80.0–100.0)
Platelets: 303 10*3/uL (ref 150–400)
RBC: 6.19 MIL/uL — ABNORMAL HIGH (ref 4.22–5.81)
RDW: 14.6 % (ref 11.5–15.5)
WBC: 23.9 10*3/uL — ABNORMAL HIGH (ref 4.0–10.5)
nRBC: 0 % (ref 0.0–0.2)

## 2019-12-03 LAB — COMPREHENSIVE METABOLIC PANEL
ALT: 82 U/L — ABNORMAL HIGH (ref 0–44)
AST: 267 U/L — ABNORMAL HIGH (ref 15–41)
Albumin: 4.4 g/dL (ref 3.5–5.0)
Alkaline Phosphatase: 89 U/L (ref 38–126)
Anion gap: 11 (ref 5–15)
BUN: 18 mg/dL (ref 6–20)
CO2: 25 mmol/L (ref 22–32)
Calcium: 10.3 mg/dL (ref 8.9–10.3)
Chloride: 100 mmol/L (ref 98–111)
Creatinine, Ser: 1.46 mg/dL — ABNORMAL HIGH (ref 0.61–1.24)
GFR, Estimated: 52 mL/min — ABNORMAL LOW (ref >60)
Glucose, Bld: 221 mg/dL — ABNORMAL HIGH (ref 70–99)
Potassium: 4.7 mmol/L (ref 3.5–5.1)
Sodium: 136 mmol/L (ref 135–145)
Total Bilirubin: 1.4 mg/dL — ABNORMAL HIGH (ref 0.3–1.2)
Total Protein: 8.5 g/dL — ABNORMAL HIGH (ref 6.5–8.1)

## 2019-12-03 LAB — PROTIME-INR
INR: 1.1 (ref 0.8–1.2)
Prothrombin Time: 13.8 seconds (ref 11.4–15.2)

## 2019-12-03 LAB — LIPID PANEL
Cholesterol: 284 mg/dL — ABNORMAL HIGH (ref 0–200)
HDL: 75 mg/dL (ref >40)
LDL Cholesterol: 185 mg/dL — ABNORMAL HIGH (ref 0–99)
Total CHOL/HDL Ratio: 3.8 RATIO
Triglycerides: 121 mg/dL (ref <150)
VLDL: 24 mg/dL (ref 0–40)

## 2019-12-03 LAB — POCT ACTIVATED CLOTTING TIME
Activated Clotting Time: 373 seconds
Activated Clotting Time: 257 seconds

## 2019-12-03 LAB — CK: Total CK: 2183 U/L — ABNORMAL HIGH (ref 49–397)

## 2019-12-03 LAB — TROPONIN I (HIGH SENSITIVITY)
Troponin I (High Sensitivity): >27000 ng/L (ref <18)
Troponin I (High Sensitivity): >27000 ng/L (ref <18)

## 2019-12-03 LAB — GLUCOSE, CAPILLARY: Glucose-Capillary: 212 mg/dL — ABNORMAL HIGH (ref 70–99)

## 2019-12-03 LAB — RESPIRATORY PANEL BY RT PCR (FLU A&B, COVID)
Influenza A by PCR: NEGATIVE
Influenza B by PCR: NEGATIVE
SARS Coronavirus 2 by RT PCR: NEGATIVE

## 2019-12-03 LAB — APTT: aPTT: 34 seconds (ref 24–36)

## 2019-12-03 SURGERY — CORONARY/GRAFT ACUTE MI REVASCULARIZATION
Anesthesia: Moderate Sedation

## 2019-12-03 MED ORDER — SODIUM CHLORIDE 0.9 % IV SOLN: INTRAVENOUS | Status: DC

## 2019-12-03 MED ORDER — SODIUM CHLORIDE 0.9% FLUSH
3.0000 mL | Freq: Two times a day (BID) | INTRAVENOUS | Status: DC
  Administered 2019-12-03 – 2019-12-05 (×4): 3 mL via INTRAVENOUS

## 2019-12-03 MED ORDER — HEPARIN SODIUM (PORCINE) 1000 UNIT/ML IJ SOLN
INTRAMUSCULAR | Status: DC | PRN
  Administered 2019-12-03: 6000 [IU] via INTRAVENOUS

## 2019-12-03 MED ORDER — INSULIN ASPART 100 UNIT/ML ~~LOC~~ SOLN
0.0000 [IU] | SUBCUTANEOUS | Status: DC
  Administered 2019-12-03: 3 [IU] via SUBCUTANEOUS
  Administered 2019-12-04: 2 [IU] via SUBCUTANEOUS
  Administered 2019-12-04: 3 [IU] via SUBCUTANEOUS
  Administered 2019-12-04 (×4): 2 [IU] via SUBCUTANEOUS
  Administered 2019-12-05: 3 [IU] via SUBCUTANEOUS
  Administered 2019-12-05: 5 [IU] via SUBCUTANEOUS
  Administered 2019-12-05: 3 [IU] via SUBCUTANEOUS
  Filled 2019-12-03 (×10): qty 1

## 2019-12-03 MED ORDER — TICAGRELOR 90 MG PO TABS
ORAL_TABLET | ORAL | Status: AC
  Filled 2019-12-03: qty 2

## 2019-12-03 MED ORDER — ENOXAPARIN SODIUM 40 MG/0.4ML ~~LOC~~ SOLN
40.0000 mg | SUBCUTANEOUS | Status: DC
  Administered 2019-12-04: 40 mg via SUBCUTANEOUS
  Filled 2019-12-03: qty 0.4

## 2019-12-03 MED ORDER — MIDAZOLAM HCL 2 MG/2ML IJ SOLN
INTRAMUSCULAR | Status: DC | PRN
  Administered 2019-12-03: 1 mg via INTRAVENOUS

## 2019-12-03 MED ORDER — CARVEDILOL 3.125 MG PO TABS
3.1250 mg | ORAL_TABLET | Freq: Two times a day (BID) | ORAL | Status: DC
  Administered 2019-12-03 – 2019-12-04 (×2): 3.125 mg via ORAL
  Filled 2019-12-03 (×2): qty 1

## 2019-12-03 MED ORDER — ONDANSETRON HCL 4 MG/2ML IJ SOLN: 4.0000 mg | Freq: Four times a day (QID) | INTRAMUSCULAR | Status: DC | PRN

## 2019-12-03 MED ORDER — SODIUM CHLORIDE 0.9% FLUSH: 3.0000 mL | INTRAVENOUS | Status: DC | PRN

## 2019-12-03 MED ORDER — ASPIRIN 81 MG PO CHEW
81.0000 mg | CHEWABLE_TABLET | Freq: Every day | ORAL | Status: DC
  Administered 2019-12-04 – 2019-12-05 (×2): 81 mg via ORAL
  Filled 2019-12-03 (×2): qty 1

## 2019-12-03 MED ORDER — VERAPAMIL HCL 2.5 MG/ML IV SOLN
INTRAVENOUS | Status: AC
  Filled 2019-12-03: qty 2

## 2019-12-03 MED ORDER — TICAGRELOR 90 MG PO TABS
90.0000 mg | ORAL_TABLET | Freq: Two times a day (BID) | ORAL | Status: DC
  Administered 2019-12-03 – 2019-12-05 (×4): 90 mg via ORAL
  Filled 2019-12-03 (×4): qty 1

## 2019-12-03 MED ORDER — FENTANYL CITRATE (PF) 100 MCG/2ML IJ SOLN
INTRAMUSCULAR | Status: DC | PRN
  Administered 2019-12-03: 25 ug via INTRAVENOUS

## 2019-12-03 MED ORDER — TACROLIMUS 1 MG PO CAPS
5.0000 mg | ORAL_CAPSULE | Freq: Two times a day (BID) | ORAL | Status: DC
  Administered 2019-12-04 (×3): 5 mg via ORAL
  Filled 2019-12-03 (×4): qty 5

## 2019-12-03 MED ORDER — HYDROCODONE-ACETAMINOPHEN 5-325 MG PO TABS
1.0000 | ORAL_TABLET | ORAL | Status: DC | PRN
  Administered 2019-12-03: 2 via ORAL
  Filled 2019-12-03: qty 2

## 2019-12-03 MED ORDER — CHLORHEXIDINE GLUCONATE CLOTH 2 % EX PADS
6.0000 | MEDICATED_PAD | Freq: Every day | CUTANEOUS | Status: DC
  Administered 2019-12-04: 6 via TOPICAL

## 2019-12-03 MED ORDER — HEPARIN SODIUM (PORCINE) 1000 UNIT/ML IJ SOLN
INTRAMUSCULAR | Status: AC
  Filled 2019-12-03: qty 1

## 2019-12-03 MED ORDER — HEPARIN SODIUM (PORCINE) 5000 UNIT/ML IJ SOLN
4000.0000 [IU] | Freq: Once | INTRAMUSCULAR | Status: AC
  Administered 2019-12-03: 4000 [IU] via INTRAVENOUS
  Filled 2019-12-03: qty 1

## 2019-12-03 MED ORDER — LOSARTAN POTASSIUM 25 MG PO TABS
25.0000 mg | ORAL_TABLET | Freq: Every day | ORAL | Status: DC
  Filled 2019-12-03: qty 1

## 2019-12-03 MED ORDER — NITROGLYCERIN 1 MG/10 ML FOR IR/CATH LAB
INTRA_ARTERIAL | Status: AC
  Filled 2019-12-03: qty 10

## 2019-12-03 MED ORDER — HEPARIN (PORCINE) IN NACL 2000-0.9 UNIT/L-% IV SOLN
INTRAVENOUS | Status: DC | PRN
  Administered 2019-12-03: 1000 mL

## 2019-12-03 MED ORDER — NITROGLYCERIN 1 MG/10 ML FOR IR/CATH LAB
INTRA_ARTERIAL | Status: DC | PRN
  Administered 2019-12-03: 100 ug via INTRACORONARY

## 2019-12-03 MED ORDER — VERAPAMIL HCL 2.5 MG/ML IV SOLN
INTRAVENOUS | Status: DC | PRN
  Administered 2019-12-03: 2.5 mg via INTRA_ARTERIAL

## 2019-12-03 MED ORDER — SODIUM CHLORIDE 0.9 % IV SOLN: 250.0000 mL | INTRAVENOUS | Status: DC | PRN

## 2019-12-03 MED ORDER — PREDNISONE 5 MG PO TABS
5.0000 mg | ORAL_TABLET | Freq: Every day | ORAL | Status: DC
  Administered 2019-12-04 – 2019-12-05 (×2): 5 mg via ORAL
  Filled 2019-12-03 (×2): qty 1

## 2019-12-03 MED ORDER — HEPARIN (PORCINE) IN NACL 1000-0.9 UT/500ML-% IV SOLN
INTRAVENOUS | Status: AC
  Filled 2019-12-03: qty 1000

## 2019-12-03 MED ORDER — ASPIRIN 81 MG PO CHEW
243.0000 mg | CHEWABLE_TABLET | Freq: Once | ORAL | Status: AC
  Administered 2019-12-03: 243 mg via ORAL

## 2019-12-03 MED ORDER — ROSUVASTATIN CALCIUM 20 MG PO TABS
20.0000 mg | ORAL_TABLET | Freq: Every day | ORAL | Status: DC
  Administered 2019-12-04: 20 mg via ORAL
  Filled 2019-12-03: qty 1
  Filled 2019-12-03: qty 2
  Filled 2019-12-03: qty 1

## 2019-12-03 MED ORDER — ACETAMINOPHEN 325 MG PO TABS: 650.0000 mg | ORAL_TABLET | ORAL | Status: DC | PRN

## 2019-12-03 MED ORDER — FENTANYL CITRATE (PF) 100 MCG/2ML IJ SOLN
INTRAMUSCULAR | Status: AC
  Filled 2019-12-03: qty 2

## 2019-12-03 MED ORDER — IOHEXOL 300 MG/ML  SOLN
INTRAMUSCULAR | Status: DC | PRN
  Administered 2019-12-03: 130 mL

## 2019-12-03 MED ORDER — GLIPIZIDE ER 10 MG PO TB24
10.0000 mg | ORAL_TABLET | Freq: Two times a day (BID) | ORAL | Status: DC
  Filled 2019-12-03: qty 1

## 2019-12-03 MED ORDER — MIDAZOLAM HCL 2 MG/2ML IJ SOLN
INTRAMUSCULAR | Status: AC
  Filled 2019-12-03: qty 2

## 2019-12-03 MED ORDER — TICAGRELOR 90 MG PO TABS
ORAL_TABLET | ORAL | Status: DC | PRN
  Administered 2019-12-03: 180 mg via ORAL

## 2019-12-03 SURGICAL SUPPLY — 17 items
BALLN TREK RX 2.5X12 (BALLOONS) ×2
BALLN ~~LOC~~ TREK RX 3.0X12 (BALLOONS) ×2
BALLOON TREK RX 2.5X12 (BALLOONS) IMPLANT
BALLOON ~~LOC~~ TREK RX 3.0X12 (BALLOONS) IMPLANT
CATH INFINITI 5FR JK (CATHETERS) ×1 IMPLANT
CATH LAUNCHER 6FR EBU3.5 (CATHETERS) ×1 IMPLANT
DEVICE RAD TR BAND REGULAR (VASCULAR PRODUCTS) ×1 IMPLANT
GLIDESHEATH SLEND SS 6F .021 (SHEATH) ×1 IMPLANT
GUIDEWIRE INQWIRE 1.5J.035X260 (WIRE) IMPLANT
INQWIRE 1.5J .035X260CM (WIRE) ×2
KIT ENCORE 26 ADVANTAGE (KITS) ×1 IMPLANT
KIT MANI 3VAL PERCEP (MISCELLANEOUS) ×2 IMPLANT
PACK CARDIAC CATH (CUSTOM PROCEDURE TRAY) ×2 IMPLANT
STENT RESOLUTE ONYX 2.5X15 (Permanent Stent) ×1 IMPLANT
WIRE HI TORQ WHISPER MS 190CM (WIRE) IMPLANT
WIRE HI TORQ WHISPER MS 300CM (WIRE) IMPLANT
WIRE RUNTHROUGH .014X180CM (WIRE) ×1 IMPLANT

## 2019-12-03 NOTE — ED Triage Notes (Signed)
Patient c/o medial chest pain described as tightness. Patient denies accompanying symptoms.

## 2019-12-03 NOTE — ED Provider Notes (Signed)
[facility] Emergency Department Provider Note   ____________________________________________   First MD Initiated Contact with Patient 12/03/19 2003     (approximate)  I have reviewed the triage vital signs and the nursing notes.   HISTORY  Chief Complaint Chest Pain    HPI Casey [name]. is a 59 y.o. male with a stated past medical history of renal transplant and hypertension who presents for central chest tightness that began approximately 6 hours prior to arrival and has been worsening since onset.  Patient describes this pain as starting as a burning, 5/10, nonradiating pain in his mid epigastric and lower sternal region that developed into a significant 7/10 chest tightness.  Patient denies any exacerbating or relieving factors.  Patient denies ever having pain similar to this in the past.  Patient denies any personal or family history of cardiac disease.  Patient denies ever having to see a cardiologist in the past.  Patient states that he did take 81 mg of aspirin prior to arrival.  EKG was obtained in triage and shows ST elevations in anterior and lateral leads concerning for a STEMI.  STEMI alert activated         Past Medical History:  Diagnosis Date  . Chronic kidney disease 04/2009   Kidney Transplant  . Diabetes mellitus   . GERD (gastroesophageal reflux disease)    as needed reflux  . Hypertension     Patient Active Problem List   Diagnosis Date Noted  . End stage renal disease ([place]) 03/03/2011  . Aneurysm of unspecified site ([place]) 03/03/2011  . Other complications due to renal dialysis device, implant, and graft 02/13/2011  . ESRD (end stage renal disease) ([place]) 02/13/2011    Past Surgical History:  Procedure Laterality Date  . AV FISTULA PLACEMENT  03/09/2011   Procedure: ARTERIOVENOUS (AV) FISTULA CREATION;  Surgeon: Rosetta Posner, MD;  Location: [facility] OR;  Service: Vascular;  Laterality: Left;  RESECTION OF VENOUS ANEURYSM OF  LEFT ARM AVF  . DIALYSIS FISTULA CREATION     last used 04/2009  . INSERTION OF DIALYSIS CATHETER     cordis dialysis catheter placement  . KIDNEY TRANSPLANT  2011    Prior to Admission medications   Medication Sig Start Date End Date Taking? Authorizing Provider  amLODipine (NORVASC) 5 MG tablet Take 10 mg by mouth daily.    Yes [provider]  cetirizine (ZYRTEC) 5 MG tablet Take 5 mg by mouth daily as needed for allergies.    Yes [provider]  Cholecalciferol 25 MCG (1000 UT) tablet Take 1,000 Units by mouth daily.   Yes [provider]  cloNIDine (CATAPRES) 0.1 MG tablet Take 0.2 mg by mouth every 12 (twelve) hours.    Yes [provider]  glipiZIDE (GLUCOTROL XL) 10 MG 24 hr tablet Take 10 mg by mouth 2 (two) times daily. 11/12/19   [provider]  labetalol (NORMODYNE) 300 MG tablet Take 300 mg by mouth 2 (two) times daily.     [provider]  lisinopril (ZESTRIL) 20 MG tablet Take 20 mg by mouth daily. 11/21/19   [provider]  predniSONE (DELTASONE) 5 MG tablet Take 5 mg by mouth Daily. 01/18/11   [provider]  rosuvastatin (CRESTOR) 20 MG tablet Take 20 mg by mouth at bedtime. 10/09/19   [provider]  tacrolimus (PROGRAF) 1 MG capsule Take 5 mg by mouth 2 (two) times daily.     [provider]    Allergies Patient has no known allergies.  Family History  Problem Relation Age of Onset  . Diabetes Mother   . Hyperlipidemia Mother   . Cancer Father     Social History Social History   Tobacco Use  . Smoking status: Never Smoker  . Smokeless tobacco: Never Used  Substance Use Topics  . Alcohol use: Yes    Comment: occassi  . Drug use: No    Review of Systems Constitutional: No fever/chills Eyes: No visual changes. ENT: No sore throat. Cardiovascular: Endorses chest pain. Respiratory: Denies shortness of breath. Gastrointestinal: No abdominal pain.  No nausea, no  vomiting.  No diarrhea. Genitourinary: Negative for dysuria. Musculoskeletal: Negative for acute arthralgias Skin: Negative for rash. Neurological: Negative for headaches, weakness/numbness/paresthesias in any extremity Psychiatric: Negative for suicidal ideation/homicidal ideation   ____________________________________________   PHYSICAL EXAM:  VITAL SIGNS: ED Triage Vitals  Enc Vitals Group     BP 12/03/19 1959 (!) 128/96     Pulse Rate 12/03/19 1959 (!) 110     Resp 12/03/19 1959 18     Temp 12/03/19 2004 98.4 F (36.9 C)     Temp Source 12/03/19 2004 Oral     SpO2 12/03/19 1959 98 %     Weight 12/03/19 1955 203 lb 14.8 oz (92.5 kg)     Height 12/03/19 1955 6\' 1"  (1.854 m)     Head Circumference --      Peak Flow --      Pain Score 12/03/19 1955 5     Pain Loc --      Pain Edu? --      Excl. in [place]? --    Constitutional: Alert and oriented. Well appearing and in no acute distress. Eyes: Conjunctivae are normal. PERRL. Head: Atraumatic. Nose: No congestion/rhinnorhea. Mouth/Throat: Mucous membranes are moist. Neck: No stridor Cardiovascular: Grossly normal heart sounds.  Good peripheral circulation. Respiratory: Normal respiratory effort.  No retractions. Gastrointestinal: Soft and nontender. No distention. Musculoskeletal: No obvious deformities Neurologic:  Normal speech and language. No gross focal neurologic deficits are appreciated. Skin:  Skin is warm and dry. No rash noted. Psychiatric: Mood and affect are normal. Speech and behavior are normal.  ____________________________________________   LABS (all labs ordered are listed, but only abnormal results are displayed)  Labs Reviewed  CBC - Abnormal; Notable for the following components:      Result Value   WBC 23.9 (*)    RBC 6.19 (*)    Hemoglobin 17.4 (*)    HCT 52.1 (*)    All other components within normal limits  RESPIRATORY PANEL BY RT PCR (FLU A&B, COVID)  PROTIME-INR  APTT  COMPREHENSIVE  METABOLIC PANEL  HEMOGLOBIN A1C  LIPID PANEL  TROPONIN I (HIGH SENSITIVITY)   ____________________________________________  EKG  ED ECG REPORT I, Naaman Plummer, the attending physician, personally viewed and interpreted this ECG.  Date: 12/03/2019 EKG Time: 1944 Rate: 111 Rhythm: normal sinus rhythm QRS Axis: normal Intervals: normal ST/T Wave abnormalities: ST elevations in anterolateral leads Narrative Interpretation: Anterolateral STEMI  ____________________________________________  RADIOLOGY  ED MD interpretation: Single view x-ray of the chest shows no acute pneumothorax, pneumonia, or widened mediastinum  Official radiology report(s): DG Chest Portable 1 View  Result Date: 12/03/2019 CLINICAL DATA:  Chest pain EXAM: PORTABLE CHEST 1 VIEW COMPARISON:  03/06/2011 FINDINGS: Low lung volumes. Heart and mediastinal contours are within normal limits. No focal opacities or effusions. No acute bony abnormality. IMPRESSION: No active disease.  Electronically Signed   By: Rolm Baptise M.D.   On: 12/03/2019 20:30    ____________________________________________   PROCEDURES  Procedure(s) performed (including Critical Care):  .Critical Care Performed by: Naaman Plummer, MD Authorized by: Naaman Plummer, MD   Critical care provider statement:    Critical care time (minutes):  15   Critical care time was exclusive of:  Separately billable procedures and treating other patients   Critical care was necessary to treat or prevent imminent or life-threatening deterioration of the following conditions:  Cardiac failure   Critical care was time spent personally by me on the following activities:  Discussions with consultants, evaluation of patient's response to treatment, examination of patient, ordering and performing treatments and interventions, ordering and review of laboratory studies, ordering and review of radiographic studies, pulse oximetry, re-evaluation of patient's  condition, obtaining history from patient or surrogate and review of old charts   I assumed direction of critical care for this patient from another provider in my specialty: no   .1-3 Lead EKG Interpretation Performed by: Naaman Plummer, MD Authorized by: Naaman Plummer, MD     Interpretation: abnormal     ECG rate:  106   ECG rate assessment: tachycardic     Rhythm: sinus tachycardia     Ectopy: none     Conduction: normal   Comments:     ST elevations     ____________________________________________   INITIAL IMPRESSION / ASSESSMENT AND PLAN / ED COURSE  As part of my medical decision making, I reviewed the following data within the [place] notes reviewed and incorporated, Labs reviewed, EKG interpreted, Old chart reviewed, Radiograph reviewed and Notes from prior ED visits reviewed and incorporated     Pt presents to triage with active chest pain and an EKG concerning for ST Elevation MI. Pt received 81mg  aspirin in the field. Patient was otherwise in their normal state of health before this chest pain rapidly ensued.  DDx: TAA or dissection, pericarditis/tamponade, PTX.  Workup: CBC, BMP, troponin, T&S, PT/INR, PTT, Repeat EKGs until cath lab, CXR.  Therapy: Not hypoxic, not requiring O2. Completed full ASA load 324mg . Anticoagulation: Pt <59yo and with suspected normal kidney function, will start Heparin bolus 4000 units.  Disposition: Admit directly to Cath Lab.      ____________________________________________   FINAL CLINICAL IMPRESSION(S) / ED DIAGNOSES  Final diagnoses:  None     ED Discharge Orders    None       Note:  This document was prepared using Dragon voice recognition software and may include unintentional dictation errors.   Naaman Plummer, MD 12/03/19 2051

## 2019-12-03 NOTE — H&P (Signed)
Casey Lieu [place] Jr. RJJ:[id number] DOB: [birth year] DOA: 12/03/2019   PCP: Dion Body, MD   Outpatient Specialists:      NEphrology:at [facility]      Patient arrived to ER on 12/03/19 at 1940 Referred by Attending Wellington Hampshire, MD   Patient coming from: home Lives  With family    Chief Complaint:   Chief Complaint  Patient presents with  . Chest Pain    HPI: Casey [name]. is a 59 y.o. male with medical history significant of DM2, sp rena transplant, HTN, CKD stage 3a, HLD, anemia   Presented with   chest pain first onset of pain was Friday night but progressively has been getting worse and today he developed chest pressure and heartburn presented to emergency department reports chest pain feels like tightness  No prior history of CAD not followed by cardiology Took aspirin prior to arrival 81 mg  Infectious risk factors:  Reports  chest pain,     Has  been vaccinated against COVID    Initial COVID TEST  NEGATIVE   Lab Results  Component Value Date   [place] 12/03/2019   Regarding pertinent Chronic problems:    Hyperlipidemia -   on statins Crestor Lipid Panel     Component Value Date/Time   CHOL 284 (H) 12/03/2019 1956   TRIG 121 12/03/2019 1956   HDL 75 12/03/2019 1956   CHOLHDL 3.8 12/03/2019 1956   VLDL 24 12/03/2019 1956   LDLCALC 185 (H) 12/03/2019 1956     HTN on Norvasc clonidine, labetalol  Status post renal transplant 2010followed by [facility] on prednisone and Prograf    DM 2 - No results found for: HGBA1C  on  PO meds only,   CKD stage IIIa-  Estimated Creatinine Clearance: 61.6 mL/min (A) (by C-G formula based on SCr of 1.46 mg/dL (H)).  Lab Results  Component Value Date   CREATININE 1.46 (H) 12/03/2019   CREATININE 2.06 (H) 05/19/2015   CREATININE 1.12 03/06/2011   While in ER: EKG showed evidence of ST elevation MI cardiology was consulted  Troponin noted to be elevated ER Provider Called: Cardiology     Dr. Fletcher Anon Patient was taken to Cath Lab Please see cardiology note for full cardiac report PEr Dr. Fletcher Anon intervention to LAD was done but pt will likely need further intervention in upcoming days During cath Patient's EF noted to be greatly diminished Which is new  Hospitalist was called for admission for STEMI  The following Work up has been ordered so far:  Orders Placed This Encounter  Procedures  . Critical Care  . 1-3 Lead EKG Interpretation  . Respiratory Panel by RT PCR (Flu A&B, Covid) - Nasopharyngeal Swab  . DG Chest Portable 1 View  . CBC  . Comprehensive metabolic panel  . Hemoglobin A1c  . Protime-INR  . APTT  . Lipid panel  . AMB Referral to Cardiac Rehabilitation - Phase II  . Diet NPO time specified Except for: Sips with Meds  . Document Height and Actual Weight  . Cardiac monitoring  . Pulse checks  . Call Code STEMI by calling [facility] at [phone]  . Document Actual / Estimated Weight  . Remove all clothing, place in gown  . Apply radiolucent defibrillator pads  . HOLD NTG SL if Patient taking Cialis, Viagra or Levitra Hold NTG SL if patient is taking Cialis, Viagra, Levitra, or Revatio  . HOLD Aspirin if Hold Aspirin if given by EMS  .  Initiate Carrier Fluid Protocol  . Pulse oximetry, continuous  . Oxygen therapy Mode or (Route): Nasal cannula; Liters Per Minute: 2; Keep 02 saturation: >90  . POCT Activated clotting time  . POCT Activated clotting time  . EKG 12-Lead  . ED EKG  . Saline lock IV (Avoid right wrist)  . Admit to Inpatient (patient's expected length of stay will be greater than 2 midnights or inpatient only procedure)   Following Medications were ordered in ER: Medications  0.9 %  sodium chloride infusion ( Intravenous New Bag/Given 12/03/19 2018)  aspirin chewable tablet 243 mg (243 mg Oral Given 12/03/19 1959)  heparin injection 4,000 Units (4,000 Units Intravenous Given 12/03/19 2016)   Significant initial  Findings: Abnormal  Labs Reviewed  CBC - Abnormal; Notable for the following components:      Result Value   WBC 23.9 (*)    RBC 6.19 (*)    Hemoglobin 17.4 (*)    HCT 52.1 (*)    All other components within normal limits  COMPREHENSIVE METABOLIC PANEL - Abnormal; Notable for the following components:   Glucose, Bld 221 (*)    Creatinine, Ser 1.46 (*)    Total Protein 8.5 (*)    AST 267 (*)    ALT 82 (*)    Total Bilirubin 1.4 (*)    GFR, Estimated 52 (*)    All other components within normal limits  LIPID PANEL - Abnormal; Notable for the following components:   Cholesterol 284 (*)    LDL Cholesterol 185 (*)    All other components within normal limits  TROPONIN I (HIGH SENSITIVITY) - Abnormal; Notable for the following components:   Troponin I (High Sensitivity) >27,000 (*)    All other components within normal limits   Otherwise labs showing:  Recent Labs  Lab 12/03/19 1956  NA 136  K 4.7  CO2 25  GLUCOSE 221*  BUN 18  CREATININE 1.46*  CALCIUM 10.3    Cr    stable,    Lab Results  Component Value Date   CREATININE 1.46 (H) 12/03/2019   CREATININE 2.06 (H) 05/19/2015   CREATININE 1.12 03/06/2011    Recent Labs  Lab 12/03/19 1956  AST 267*  ALT 82*  ALKPHOS 89  BILITOT 1.4*  PROT 8.5*  ALBUMIN 4.4   Lab Results  Component Value Date   CALCIUM 10.3 12/03/2019  WBC      Component Value Date/Time   WBC 23.9 (H) 12/03/2019 1956    Plt: Lab Results  Component Value Date   PLT 303 12/03/2019    Lactic Acid, Venous    Component Value Date/Time   LATICACIDVEN 1.4 05/19/2015 1623       HG/HCT   stable,       Component Value Date/Time   HGB 17.4 (H) 12/03/2019 1956   HCT 52.1 (H) 12/03/2019 1956   MCV 84.2 12/03/2019 1956      Troponin >27K     ECG: Ordered Personally reviewed by me showing: HR : 111 Rhythm: NSR,  Ischemic changes STEMI QTC 418  UA  not ordered      Ordered    CXR -  NON acute    ED Triage Vitals  Enc Vitals Group     BP  12/03/19 1959 (!) 128/96     Pulse Rate 12/03/19 1959 (!) 110     Resp 12/03/19 1959 18     Temp 12/03/19 2004 98.4 F (36.9 C)  Temp Source 12/03/19 2004 Oral     SpO2 12/03/19 1959 98 %     Weight 12/03/19 1955 203 lb 14.8 oz (92.5 kg)     Height 12/03/19 1955 6\' 1"  (1.854 m)     Head Circumference --      Peak Flow --      Pain Score 12/03/19 1955 5     Pain Loc --      Pain Edu? --      Excl. in [place]? --   TMAX(24)@       Latest  Blood pressure (!) 127/92, pulse (!) 104, temperature 98.4 F (36.9 C), temperature source Oral, resp. rate (!) 24, height 6\' 1"  (1.854 m), weight 92.5 kg, SpO2 98 %.    Review of Systems:    Pertinent positives include: chest pain,dyspnea on exertion,   Constitutional:  No weight loss, night sweats, Fevers, chills, fatigue, weight loss  HEENT:  No headaches, Difficulty swallowing,Tooth/dental problems,Sore throat,  No sneezing, itching, ear ache, nasal congestion, post nasal drip,  Cardio-vascular:  No  Orthopnea, PND, anasarca, dizziness, palpitations.no Bilateral lower extremity swelling  GI:  No heartburn, indigestion, abdominal pain, nausea, vomiting, diarrhea, change in bowel habits, loss of appetite, melena, blood in stool, hematemesis Resp:  no shortness of breath at rest. No No excess mucus, no productive cough, No non-productive cough, No coughing up of blood.No change in color of mucus.No wheezing. Skin:  no rash or lesions. No jaundice GU:  no dysuria, change in color of urine, no urgency or frequency. No straining to urinate.  No flank pain.  Musculoskeletal:  No joint pain or no joint swelling. No decreased range of motion. No back pain.  Psych:  No change in mood or affect. No depression or anxiety. No memory loss.  Neuro: no localizing neurological complaints, no tingling, no weakness, no double vision, no gait abnormality, no slurred speech, no confusion  All systems reviewed and apart from [place] all are negative  Past  Medical History:   Past Medical History:  Diagnosis Date  . Chronic kidney disease 04/2009   Kidney Transplant  . Diabetes mellitus   . GERD (gastroesophageal reflux disease)    as needed reflux  . Hypertension       Past Surgical History:  Procedure Laterality Date  . 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 DIALYSIS FISTULA CREATION     last used 04/2009  . INSERTION OF DIALYSIS CATHETER     cordis dialysis catheter placement  . KIDNEY TRANSPLANT  2011    Social History:  Ambulatory   independently      reports that he has never smoked. He has never used smokeless tobacco. He reports current alcohol use. He reports that he does not use drugs.   Family History:   Family History  Problem Relation Age of Onset  . Diabetes Mother   . Hyperlipidemia Mother   . Cancer Father     Allergies: No Known Allergies   Prior to Admission medications   Medication Sig Start Date End Date Taking? Authorizing Provider  amLODipine (NORVASC) 5 MG tablet Take 10 mg by mouth daily.    Yes [provider]  cetirizine (ZYRTEC) 5 MG tablet Take 5 mg by mouth daily as needed for allergies.    Yes [provider]  Cholecalciferol 25 MCG (1000 UT) tablet Take 1,000 Units by mouth daily.   Yes [provider]  cloNIDine (CATAPRES) 0.1 MG tablet Take 0.2 mg by mouth every 12 (twelve) hours.    Yes [provider]  glipiZIDE (GLUCOTROL XL) 10 MG 24 hr tablet Take 10 mg by mouth 2 (two) times daily. 11/12/19   [provider]  labetalol (NORMODYNE) 300 MG tablet Take 300 mg by mouth 2 (two) times daily.     [provider]  lisinopril (ZESTRIL) 20 MG tablet Take 20 mg by mouth daily. 11/21/19   [provider]  predniSONE (DELTASONE) 5 MG tablet Take 5 mg by mouth  Daily. 01/18/11   [provider]  rosuvastatin (CRESTOR) 20 MG tablet Take 20 mg by mouth at bedtime. 10/09/19   [provider]  tacrolimus (PROGRAF) 1 MG capsule Take 5 mg by mouth 2 (two) times daily.     [provider]   Physical Exam: Vitals with BMI 12/03/2019 12/03/2019 12/03/2019  Height - - 6\' 1"   Weight - - 203 lbs 15 oz  BMI - - 58.52  Systolic 778 242 -  Diastolic 92 96 -  Pulse 353 110 -  Some encounter information is confidential and restricted. Go to Review Flowsheets activity to see all data.     1. General:  in No  Acute distress   acutely ill -appearing 2. Psychological: Alert and   Oriented 3. Head/ENT:     Dry Mucous Membranes                          Head Non traumatic, neck supple                          Poor Dentition 4. SKIN:   decreased Skin turgor,  Skin clean Dry and intact no rash 5. Heart: Regular rate and rhythm no    Murmur, no Rub or gallop 6. Lungs:  no wheezes or crackles   7. Abdomen: Soft,  non-tender, Non distended  bowel sounds present 8. Lower extremities: no clubbing, cyanosis, no  edema 9. Neurologically Grossly intact, moving all 4 extremities equally   10. MSK: Normal range of motion   All other LABS:     Recent Labs  Lab 12/03/19 1956  WBC 23.9*  HGB 17.4*  HCT 52.1*  MCV 84.2  PLT 303     Recent Labs  Lab 12/03/19 1956  NA 136  K 4.7  CL 100  CO2 25  GLUCOSE 221*  BUN 18  CREATININE 1.46*  CALCIUM 10.3     Recent Labs  Lab 12/03/19 1956  AST 267*  ALT 82*  ALKPHOS 89  BILITOT 1.4*  PROT 8.5*  ALBUMIN 4.4       Cultures:    Component Value Date/Time   SDES URINE, CLEAN CATCH 05/19/2015 1700   SPECREQUEST Immunocompromised 05/19/2015 1700   CULT >=100,000 COLONIES/mL ESCHERICHIA COLI (A) 05/19/2015 1700   REPTSTATUS 05/22/2015 FINAL 05/19/2015 1700     Radiological Exams on Admission: DG Chest Portable 1 View  Result Date: 12/03/2019 CLINICAL DATA:  Chest pain EXAM:  PORTABLE CHEST 1 VIEW COMPARISON:  03/06/2011 FINDINGS: Low lung volumes. Heart and mediastinal contours are within normal limits. No focal opacities or effusions. No acute bony abnormality. IMPRESSION: No active disease. Electronically Signed   By: Rolm Baptise M.D.   On: 12/03/2019 20:30  Chart has been reviewed  Assessment/Plan 59 y.o. male with medical history significant of DM2, sp rena transplant, HTN, CKD stage 3a, HLD, anemia Admitted for STEMI  Present on Admission: . Acute ST elevation myocardial infarction (STEMI) due to occlusion of left anterior descending (LAD) coronary artery [facility]) -patient status post cardiac catheterization, further care recommendations and treatment as per cardiology team CARDIAC CATHETERIZATION  Result Date: 12/03/2019  There is severe left ventricular systolic dysfunction.  LV end diastolic pressure is moderately elevated.  The left ventricular ejection fraction is less than 25% by visual estimate.  Mid Cx to Dist Cx lesion is 95% stenosed.  3rd Mrg lesion is 80% stenosed.  A drug-eluting stent was successfully placed using a STENT RESOLUTE ONYX 2.5X15.  Prox Cx lesion is 30% stenosed.  Mid RCA to Dist RCA lesion is 99% stenosed.  Mid LAD-1 lesion is 100% stenosed.  Post intervention, there is a 0% residual stenosis.  Mid LAD-2 lesion is 40% stenosed.  Prox LAD lesion is 30% stenosed.  1.  Late presenting anterior ST elevation myocardial infarction.  The culprit is an occluded mid LAD.  However, the patient also has severe three-vessel coronary artery disease.  There is also significant myocardial bridge in the distal LAD. 2.  Severely reduced LV systolic function with an EF of 15% with mid to distal anterior, apical and distal inferior akinesis.  No evidence of LV thrombus. 3.  High normal systolic blood pressure and moderately elevated left ventricular end-diastolic pressure at 24 mmHg. 3.  Successful angioplasty and drug-eluting stent placement to  the mid LAD  . Type 2 diabetes mellitus with hyperlipidemia (HCC) -  - Order Sensitive SSI    -  check TSH and HgA1C  - Hold by mouth medications    . Hyperlipidemia -continue statin  . CKD (chronic kidney disease), stage III [facility]) -consulted nephrology avoid nephrotoxic medications Leukocytosis -most likely stress reaction in the setting of STEMI  Elevated LFTs -continue to follow suspect could be secondary to hypoperfusion in the setting of STEMI.  Will check CK level if does not improve will need further work-up  pt denies any EtOH use   Other plan as per orders.  DVT prophylaxis: Lovenox       Code Status:    Code Status: Not on file FULL CODE  as per patient   I had personally discussed CODE STATUS with patient    Family Communication:   Family not at  Bedside    Disposition Plan:      To home once workup is complete and patient is stable   Following barriers for discharge:                           Cardiac intervention completed                           Will need consultants to evaluate patient prior to discharge                     Consults called: cardiology Dr. Fletcher Anon, Nephrology Dr. Aleene Davidson  Admission status:  ED Disposition    ED Disposition Condition Comment   Admit  The patient appears reasonably stabilized for admission considering the current resources, flow, and capabilities available in the ED at this time, and I doubt any other [facility] requiring further screening and/or treatment in the ED prior to admission is  present.          inpatient     I Expect 2 midnight stay secondary to severity of patient's current illness need for inpatient interventions justified by the following:  Severe lab/radiological/exam abnormalities including:    STEMI  and extensive comorbidities including:  DM2   CKD Hx of transplant    That are currently affecting medical management.   I expect  patient to be hospitalized for 2 midnights requiring inpatient medical  care.  Patient is at high risk for adverse outcome (such as loss of life or disability) if not treated.  Indication for inpatient stay as follows:    Need for operative/procedural  intervention   Need for   IV pain medications,     Level of care  ICU tele indefinitely please discontinue once patient no longer qualifies COVID-19 Labs    Lab Results  Component Value Date   [place] NEGATIVE 12/03/2019     Precautions: admitted as Covid Negative     PPE: Used by the provider:  P100  eye Goggles,  Gloves   Casey [name] 12/04/2019, 12:47 AM    [facility]     after 2 AM please page floor coverage PA If 7AM-7PM, please contact the day team taking care of the patient using [email or web address]   Patient was evaluated in the context of the global COVID-19 pandemic, which necessitated consideration that the patient might be at risk for infection with the SARS-CoV-2 virus that causes COVID-19. Institutional protocols and algorithms that pertain to the evaluation of patients at risk for COVID-19 are in a state of rapid change based on information released by regulatory bodies including the CDC and federal and state organizations. These policies and algorithms were followed during the patient's care.

## 2019-12-03 NOTE — ED Notes (Signed)
FIRST NURSE NOTE: Patient with onset of chest pain Friday night. Has progressively gotten worse. Tonight thought he had heart burn but then had chest pressure. Denies N/V.

## 2019-12-03 NOTE — Consult Note (Signed)
Cardiology Consultation:   [name] ID: Casey [name]. MRN: [id number]; DOB: [birth year]  Admit date: 12/03/2019 Date of Consult: 12/03/2019  Primary Care Provider: Dion Body, MD [facility] Cardiologist: new Fletcher Anon) [facility] Electrophysiologist:  None    [name] Profile:   Casey [name]. is a 59 y.o. male with a hx of kidney transplant, type 2 diabetes, essential hypertension and hyperlipidemia who is being seen today for Casey evaluation of anterior ST elevation myocardial infarction at Casey request of  Dr. Cheri Fowler  History of Present Illness:   [name] is a 59 year old African American male with history of kidney transplant in 2011 with stage IIIa chronic kidney disease, type 2 diabetes, hypertension, and hyperlipidemia.  He is not a smoker and is not aware of any previous cardiac history.  Casey [name] reports that over Casey last 1 to 2 months he has experienced exertional chest pain and tightness that resolved with rest.  He did not inform family his family members or his primary care physician.  He had an episode of chest pain and tightness at rest about 1 month ago that lasted about 10 minutes.  He started having substernal chest burning and tightness feeling on Friday which continued since then.  Casey [name] thought it was due to acid reflux but his symptoms did not improve.  Today, he started having chest pain especially with taking a deep breath.  He continued to have Casey same tightness feeling and thus he came to Casey emergency room for evaluation.  An EKG was performed which showed extensive anterior and anteroseptal Q waves with persistent ST elevation.  Given his symptoms and EKG changes, code STEMI was called.  At Casey time of my evaluation, Casey [name] was having 5 out of 10 chest pain.  Surprisingly, his vital signs were reasonable including normal blood pressure which if anything was on Casey high side.  He was mildly tachycardic with a heart rate of 110 bpm.   Given his symptoms and EKG changes, I recommended proceeding with emergent cardiac catheterization and possible PCI.  I discussed this with Casey [name] and his wife who was at Casey bedside.   Past Medical History:  Diagnosis Date  . Chronic kidney disease 04/2009   Kidney Transplant  . Diabetes mellitus   . GERD (gastroesophageal reflux disease)    as needed reflux  . Hypertension     Past Surgical History:  Procedure Laterality Date  . 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 DIALYSIS FISTULA CREATION     last used 04/2009  . INSERTION OF DIALYSIS CATHETER     cordis dialysis catheter placement  . KIDNEY TRANSPLANT  2011     Home Medications:  Prior to Admission medications   Medication Sig Start Date End Date Taking? Authorizing Provider  amLODipine (NORVASC) 5 MG tablet Take 10 mg by mouth daily.    Yes [provider]  cetirizine (ZYRTEC) 5 MG tablet Take 5 mg by mouth daily as needed for allergies.    Yes [provider]  Cholecalciferol 25 MCG (1000 UT) tablet Take 1,000 Units by mouth daily.   Yes [provider]  cloNIDine (CATAPRES) 0.1 MG tablet Take 0.2 mg by mouth every 12 (twelve) hours.    Yes [provider]  glipiZIDE (GLUCOTROL XL)  10 MG 24 hr tablet Take 10 mg by mouth 2 (two) times daily. 11/12/19   [provider]  labetalol (NORMODYNE) 300 MG tablet Take 300 mg by mouth 2 (two) times daily.     [provider]  lisinopril (ZESTRIL) 20 MG tablet Take 20 mg by mouth daily. 11/21/19   [provider]  predniSONE (DELTASONE) 5 MG tablet Take 5 mg by mouth Daily. 01/18/11   [provider]  rosuvastatin (CRESTOR) 20 MG tablet Take 20 mg by mouth at bedtime. 10/09/19   [provider]  tacrolimus (PROGRAF) 1 MG capsule  Take 5 mg by mouth 2 (two) times daily.     [provider]    Inpatient Medications: Scheduled Meds: . [START ON 12/04/2019] aspirin  81 mg Oral Daily  . carvedilol  3.125 mg Oral BID WC  . [START ON 12/04/2019] Chlorhexidine Gluconate Cloth  6 each Topical Daily  . [START ON 12/04/2019] enoxaparin (LOVENOX) injection  40 mg Subcutaneous Q24H  . [START ON 12/04/2019] glipiZIDE  10 mg Oral BID  . [START ON 12/04/2019] insulin aspart  0-9 Units Subcutaneous Q4H  . [START ON 12/04/2019] losartan  25 mg Oral Daily  . [START ON 12/04/2019] predniSONE  5 mg Oral Q breakfast  . [START ON 12/04/2019] rosuvastatin  20 mg Oral q1800  . sodium chloride flush  3 mL Intravenous Q12H  . tacrolimus  5 mg Oral BID  . ticagrelor  90 mg Oral BID   Continuous Infusions: . sodium chloride 20 mL/hr at 12/03/19 2018  . sodium chloride     PRN Meds:   Allergies:   No Known Allergies  Social History:   Social History   Socioeconomic History  . Marital status: Married    Spouse name: Not on file  . Number of children: Not on file  . Years of education: Not on file  . Highest education level: Not on file  Occupational History  . Not on file  Tobacco Use  . Smoking status: Never Smoker  . Smokeless tobacco: Never Used  Substance and Sexual Activity  . Alcohol use: Yes    Comment: occassi  . Drug use: No  . Sexual activity: Not on file  Other Topics Concern  . Not on file  Social History Narrative  . Not on file   Social Determinants of Health   Financial Resource Strain:   . Difficulty of Paying Living Expenses: Not on file  Food Insecurity:   . Worried About Charity fundraiser in [place] Last Year: Not on file  . Ran Out of Food in [place] Last Year: Not on file  Transportation Needs:   . Lack of Transportation (Medical): Not on file  . Lack of Transportation (Non-Medical): Not on file  Physical Activity:   . Days of Exercise per Week: Not on file  . Minutes of Exercise per  Session: Not on file  Stress:   . Feeling of Stress : Not on file  Social Connections:   . Frequency of Communication with Friends and Family: Not on file  . Frequency of Social Gatherings with Friends and Family: Not on file  . Attends Religious Services: Not on file  . Active Member of Clubs or Organizations: Not on file  . Attends Archivist Meetings: Not on file  . Marital Status: Not on file  Intimate Partner Violence:   . Fear of Current or Ex-Partner: Not on file  . Emotionally Abused: Not on  file  . Physically Abused: Not on file  . Sexually Abused: Not on file    Family History:    Family History  Problem Relation Age of Onset  . Diabetes Mother   . Hyperlipidemia Mother   . Cancer Father      ROS:  Please see Casey history of present illness.   All other ROS reviewed and negative.     Physical Exam/Data:   Vitals:   12/03/19 1959 12/03/19 2004 12/03/19 2030 12/03/19 2200  BP: (!) 128/96  (!) 127/92   Pulse: (!) 110  (!) 104   Resp: 18  (!) 24   Temp:  98.4 F (36.9 C)  98.3 F (36.8 C)  TempSrc:  Oral  Oral  SpO2: 98%  98%   Weight:      Height:    6\' 1"  (1.854 m)   No intake or output data in Casey 24 hours ending 12/03/19 2233 Last 3 Weights 12/03/2019 05/19/2015 03/24/2011  Weight (lbs) 203 lb 14.8 oz 204 lb 214 lb  Weight (kg) 92.5 kg 92.534 kg 97.07 kg  Some encounter information is confidential and restricted. Go to Review Flowsheets activity to see all data.     Body mass index is 26.9 kg/m.  General:  Well nourished, well developed, in no acute distress HEENT: normal Lymph: no adenopathy Neck: no JVD Endocrine:  No thryomegaly Vascular: No carotid bruits; FA pulses 2+ bilaterally without bruits  Cardiac:  normal S1, S2; RRR; no murmur  Lungs:  clear to auscultation bilaterally, no wheezing, rhonchi or rales  Abd: soft, nontender, no hepatomegaly  Ext: no edema Musculoskeletal:  No deformities, BUE and BLE strength normal and  equal Skin: warm and dry  Neuro:  CNs 2-12 intact, no focal abnormalities noted Psych:  Normal affect   EKG:  Casey EKG was personally reviewed and demonstrates: Sinus rhythm with anterior Q waves with 4-5 mm of ST elevation.   Relevant CV Studies:   Laboratory Data:  High Sensitivity Troponin:   Recent Labs  Lab 12/03/19 1956  TROPONINIHS >27,000*     Chemistry Recent Labs  Lab 12/03/19 1956  NA 136  K 4.7  CL 100  CO2 25  GLUCOSE 221*  BUN 18  CREATININE 1.46*  CALCIUM 10.3  GFRNONAA 52*  ANIONGAP 11    Recent Labs  Lab 12/03/19 1956  PROT 8.5*  ALBUMIN 4.4  AST 267*  ALT 82*  ALKPHOS 89  BILITOT 1.4*   Hematology Recent Labs  Lab 12/03/19 1956  WBC 23.9*  RBC 6.19*  HGB 17.4*  HCT 52.1*  MCV 84.2  MCH 28.1  MCHC 33.4  RDW 14.6  PLT 303   BNPNo results for input(s): BNP, PROBNP in Casey last 168 hours.  DDimer No results for input(s): DDIMER in Casey last 168 hours.   Radiology/Studies:  CARDIAC CATHETERIZATION  Result Date: 12/03/2019  There is severe left ventricular systolic dysfunction.  LV end diastolic pressure is moderately elevated.  Casey left ventricular ejection fraction is less than 25% by visual estimate.  Mid Cx to Dist Cx lesion is 95% stenosed.  3rd Mrg lesion is 80% stenosed.  A drug-eluting stent was successfully placed using a STENT RESOLUTE ONYX 2.5X15.  Prox Cx lesion is 30% stenosed.  Mid RCA to Dist RCA lesion is 99% stenosed.  Mid LAD-1 lesion is 100% stenosed.  Post intervention, there is a 0% residual stenosis.  Mid LAD-2 lesion is 40% stenosed.  Prox LAD lesion is 30%  stenosed.  1.  Late presenting anterior ST elevation myocardial infarction.  Casey culprit is an occluded mid LAD.  However, Casey [name] also has severe three-vessel coronary artery disease.  There is also significant myocardial bridge in Casey distal LAD. 2.  Severely reduced LV systolic function with an EF of 15% with mid to distal anterior, apical and  distal inferior akinesis.  No evidence of LV thrombus. 3.  High normal systolic blood pressure and moderately elevated left ventricular end-diastolic pressure at 24 mmHg. 3.  Successful angioplasty and drug-eluting stent placement to Casey mid LAD. Recommendations: In spite of late presentation and significant underlying three-vessel coronary artery disease, Casey [name] was not in cardiogenic shock.  I elected to revascularize Casey LAD in spite of late presentation given continued chest pain and EKG changes.  Casey [name] reported resolution of chest pain by Casey end of Casey procedure. He continues to have significant disease involving Casey RCA and left circumflex.  Casey whole distal RCA is diffusely diseased and not revascularizable.  Casey left circumflex can be treated with staged PCI before hospital discharge.  I elected not to do that today given underlying chronic kidney disease and kidney transplant status. Continue dual antiplatelet therapy for at least 1 year. Aggressive treatment of risk factors. We will avoid hydration given significant reduced EF and at Casey same time will only diuresis if needed based on his respiratory status to decrease Casey chance of contrast-induced nephropathy.   DG Chest Portable 1 View  Result Date: 12/03/2019 CLINICAL DATA:  Chest pain EXAM: PORTABLE CHEST 1 VIEW COMPARISON:  03/06/2011 FINDINGS: Low lung volumes. Heart and mediastinal contours are within normal limits. No focal opacities or effusions. No acute bony abnormality. IMPRESSION: No active disease. Electronically Signed   By: Rolm Baptise M.D.   On: 12/03/2019 20:30     Assessment and Plan:   1. Late presenting anterior ST elevation myocardial infarction: Unfortunately, Casey [name]'s chest pain was more than 24 hours ago with evidence of extensive Q waves in his EKG.  Nonetheless, he continues to have 5 out of 10 chest pain and he has persistent ST elevation.  Due to that, I recommended proceeding with emergent cardiac  catheterization possible PCI.  I did explain to them that significant damage to his heart is expected given late presentation.  Emergent cardiac catheterization was done via Casey right radial artery which showed severe three-vessel coronary artery disease but Casey culprit was an occluded mid LAD.  There was some difficulty crossing Casey occlusion but ultimately I was able to do so and placed 1 drug-eluting stent placement.  Casey [name] has severe disease in Casey mid to distal left circumflex that will need to be treated with PCI before hospital discharge.  I elected not to do that at Casey same time to minimize contrast volume given kidney transplant status and underlying chronic kidney disease.  Casey right coronary artery is diffusely diseased throughout Casey whole distal segment and Casey branches and thus not suitable for revascularization.  Casey RCA does get collaterals from Casey LAD.  Casey [name] is at significant risk for hemodynamic decompensation and will have to be monitored closely.  Continue dual antiplatelet therapy with aspirin and ticagrelor.  High-dose statin is recommended.   2. Acute systolic heart failure due to severe ischemic cardiomyopathy: EF is 15%.  I started small dose carvedilol and losartan with plans to transition to [facility] as an outpatient if blood pressure tolerates.  Will have to monitor for any signs  of cardiogenic shock.  We will check echocardiogram tomorrow. 3. Status post kidney transplant: Creatinine is 1.46.  Avoid nephrotoxic medications.  I elected not to hydrate him post-cath given significant reduced EF and moderately elevated left ventricular end-diastolic pressure.  Consult nephrology to help with his medications.  Continue Prograf and prednisone for now. 4. Essential hypertension: Hold antihypertensive medications including clonidine, amlodipine and lisinopril for now so that we can uptitrate heart failure medications. 5. Hyperlipidemia: His LDL appears to be very elevated in  spite of rosuvastatin.  We will have to check compliance and Casey [name] ultimately might require treatment with a PCSK9 inhibitor.    TIMI Risk Score for ST  Elevation MI:   Casey [name]'s TIMI risk score is 7, which indicates a 23.4% risk of all cause mortality at 30 days.       For questions or updates, please contact [place] Please consult [email or web address] for contact info under    Signed, Kathlyn Sacramento, MD  12/03/2019 10:33 PM

## 2019-12-04 ENCOUNTER — Inpatient Hospital Stay (HOSPITAL_COMMUNITY)
Admit: 2019-12-04 | Discharge: 2019-12-04 | Disposition: A | Discharge status: Home / Self Care | Payer: BC Managed Care – PPO | Attending: Cardiovascular Disease | Admitting: Cardiovascular Disease

## 2019-12-04 ENCOUNTER — Encounter: Payer: Self-pay | Admitting: Cardiovascular Disease

## 2019-12-04 DIAGNOSIS — I5021 Acute systolic (congestive) heart failure: Secondary | ICD-10-CM

## 2019-12-04 DIAGNOSIS — I2102 ST elevation (STEMI) myocardial infarction involving left anterior descending coronary artery: Secondary | ICD-10-CM | POA: Diagnosis not present

## 2019-12-04 DIAGNOSIS — I10 Essential (primary) hypertension: Secondary | ICD-10-CM

## 2019-12-04 DIAGNOSIS — I213 ST elevation (STEMI) myocardial infarction of unspecified site: Secondary | ICD-10-CM

## 2019-12-04 LAB — CBC WITH DIFFERENTIAL/PLATELET
Abs Immature Granulocytes: 0.14 10*3/uL — ABNORMAL HIGH (ref 0.00–0.07)
Basophils Absolute: 0.1 10*3/uL (ref 0.0–0.1)
Basophils Relative: 0 %
Eosinophils Absolute: 0 10*3/uL (ref 0.0–0.5)
Eosinophils Relative: 0 %
HCT: 46.1 % (ref 39.0–52.0)
Hemoglobin: 15.3 g/dL (ref 13.0–17.0)
Immature Granulocytes: 1 %
Lymphocytes Relative: 7 %
Lymphs Abs: 1.6 10*3/uL (ref 0.7–4.0)
MCH: 27.7 pg (ref 26.0–34.0)
MCHC: 33.2 g/dL (ref 30.0–36.0)
MCV: 83.4 fL (ref 80.0–100.0)
Monocytes Absolute: 2.6 10*3/uL — ABNORMAL HIGH (ref 0.1–1.0)
Monocytes Relative: 11 %
Neutro Abs: 18.5 10*3/uL — ABNORMAL HIGH (ref 1.7–7.7)
Neutrophils Relative %: 81 %
Platelets: 314 10*3/uL (ref 150–400)
RBC: 5.53 MIL/uL (ref 4.22–5.81)
RDW: 14.6 % (ref 11.5–15.5)
WBC: 22.9 10*3/uL — ABNORMAL HIGH (ref 4.0–10.5)
nRBC: 0 % (ref 0.0–0.2)

## 2019-12-04 LAB — BASIC METABOLIC PANEL
Anion gap: 11 (ref 5–15)
BUN: 30 mg/dL — ABNORMAL HIGH (ref 6–20)
CO2: 21 mmol/L — ABNORMAL LOW (ref 22–32)
Calcium: 9.7 mg/dL (ref 8.9–10.3)
Chloride: 103 mmol/L (ref 98–111)
Creatinine, Ser: 1.86 mg/dL — ABNORMAL HIGH (ref 0.61–1.24)
GFR, Estimated: 39 mL/min — ABNORMAL LOW (ref >60)
Glucose, Bld: 207 mg/dL — ABNORMAL HIGH (ref 70–99)
Potassium: 4.9 mmol/L (ref 3.5–5.1)
Sodium: 135 mmol/L (ref 135–145)

## 2019-12-04 LAB — COMPREHENSIVE METABOLIC PANEL
ALT: 67 U/L — ABNORMAL HIGH (ref 0–44)
AST: 245 U/L — ABNORMAL HIGH (ref 15–41)
Albumin: 3.6 g/dL (ref 3.5–5.0)
Alkaline Phosphatase: 75 U/L (ref 38–126)
Anion gap: 10 (ref 5–15)
BUN: 20 mg/dL (ref 6–20)
CO2: 22 mmol/L (ref 22–32)
Calcium: 9.5 mg/dL (ref 8.9–10.3)
Chloride: 102 mmol/L (ref 98–111)
Creatinine, Ser: 1.53 mg/dL — ABNORMAL HIGH (ref 0.61–1.24)
GFR, Estimated: 49 mL/min — ABNORMAL LOW (ref >60)
Glucose, Bld: 228 mg/dL — ABNORMAL HIGH (ref 70–99)
Potassium: 4.8 mmol/L (ref 3.5–5.1)
Sodium: 134 mmol/L — ABNORMAL LOW (ref 135–145)
Total Bilirubin: 1.2 mg/dL (ref 0.3–1.2)
Total Protein: 6.7 g/dL (ref 6.5–8.1)

## 2019-12-04 LAB — MAGNESIUM: Magnesium: 2.1 mg/dL (ref 1.7–2.4)

## 2019-12-04 LAB — TROPONIN I (HIGH SENSITIVITY): Troponin I (High Sensitivity): >27000 ng/L (ref <18)

## 2019-12-04 LAB — ECHOCARDIOGRAM COMPLETE
AR max vel: 1.72 cm2
AV Area VTI: 1.97 cm2
AV Area mean vel: 1.76 cm2
AV Mean grad: 3 mmHg
AV Peak grad: 5.9 mmHg
Ao pk vel: 1.21 m/s
Area-P 1/2: 5.2 cm2
Height: 73 in
S' Lateral: 3.51 cm
Weight: 3262.81 oz

## 2019-12-04 LAB — TSH: TSH: 0.63 u[IU]/mL (ref 0.350–4.500)

## 2019-12-04 LAB — GLUCOSE, CAPILLARY
Glucose-Capillary: 193 mg/dL — ABNORMAL HIGH (ref 70–99)
Glucose-Capillary: 185 mg/dL — ABNORMAL HIGH (ref 70–99)
Glucose-Capillary: 210 mg/dL — ABNORMAL HIGH (ref 70–99)
Glucose-Capillary: 190 mg/dL — ABNORMAL HIGH (ref 70–99)
Glucose-Capillary: 190 mg/dL — ABNORMAL HIGH (ref 70–99)
Glucose-Capillary: 198 mg/dL — ABNORMAL HIGH (ref 70–99)

## 2019-12-04 LAB — PHOSPHORUS: Phosphorus: 2.9 mg/dL (ref 2.5–4.6)

## 2019-12-04 LAB — MRSA PCR SCREENING: MRSA by PCR: NEGATIVE

## 2019-12-04 LAB — HEMOGLOBIN A1C
Hgb A1c MFr Bld: 7.8 % — ABNORMAL HIGH (ref 4.8–5.6)
Mean Plasma Glucose: 177 mg/dL

## 2019-12-04 LAB — HIV ANTIBODY (ROUTINE TESTING W REFLEX): HIV Screen 4th Generation wRfx: NONREACTIVE

## 2019-12-04 LAB — HEPARIN LEVEL (UNFRACTIONATED): Heparin Unfractionated: 0.59 IU/mL (ref 0.30–0.70)

## 2019-12-04 MED ORDER — ALPRAZOLAM 0.25 MG PO TABS
0.2500 mg | ORAL_TABLET | Freq: Every evening | ORAL | Status: DC | PRN
  Administered 2019-12-04: 0.25 mg via ORAL
  Filled 2019-12-04: qty 1

## 2019-12-04 MED ORDER — MILRINONE LACTATE IN DEXTROSE 20-5 MG/100ML-% IV SOLN
0.2500 ug/kg/min | INTRAVENOUS | Status: DC
  Administered 2019-12-04 – 2019-12-05 (×2): 0.25 ug/kg/min via INTRAVENOUS
  Filled 2019-12-04 (×2): qty 100

## 2019-12-04 MED ORDER — SODIUM CHLORIDE 0.9 % IV SOLN: INTRAVENOUS | Status: DC

## 2019-12-04 MED ORDER — PERFLUTREN LIPID MICROSPHERE
1.0000 mL | INTRAVENOUS | Status: AC | PRN
  Administered 2019-12-04: 2 mL via INTRAVENOUS
  Filled 2019-12-04: qty 10

## 2019-12-04 MED ORDER — HEPARIN BOLUS VIA INFUSION
4000.0000 [IU] | Freq: Once | INTRAVENOUS | Status: AC
  Administered 2019-12-04: 4000 [IU] via INTRAVENOUS
  Filled 2019-12-04: qty 4000

## 2019-12-04 MED ORDER — HEPARIN (PORCINE) 25000 UT/250ML-% IV SOLN
1400.0000 [IU]/h | INTRAVENOUS | Status: DC
  Administered 2019-12-04 – 2019-12-05 (×2): 1400 [IU]/h via INTRAVENOUS
  Filled 2019-12-04 (×2): qty 250

## 2019-12-04 MED ORDER — INSULIN GLARGINE 100 UNIT/ML ~~LOC~~ SOLN
5.0000 [IU] | Freq: Every day | SUBCUTANEOUS | Status: DC
  Administered 2019-12-04: 5 [IU] via SUBCUTANEOUS
  Filled 2019-12-04 (×2): qty 0.05

## 2019-12-04 NOTE — Progress Notes (Signed)
OT Cancellation Note  Patient Details Name: Casey [name]. MRN: [id number] DOB: November 23, 1960   Cancelled Treatment:    Reason Eval/Treat Not Completed: Patient not medically ready. OT evaluation order received and chart reviewed. Pt on hold today secondary to still experiencing chest pain and appears to be experiencing tachycardia at rest. OT will follow up when pt is medically able to participate in therapeutic intervention.    Darleen Crocker, MS, OTR/L , CBIS ascom [phone]  12/04/19, 2:00 PM   12/04/2019, 1:59 PM

## 2019-12-04 NOTE — Progress Notes (Signed)
ANTICOAGULATION CONSULT NOTE  Pharmacy Consult for heparin Indication: apical thrombus  No Known Allergies  Patient Measurements: Height: 6\' 1"  (185.4 cm) Weight: 92.5 kg (203 lb 14.8 oz) IBW/kg (Calculated) : 79.9 Heparin Dosing Weight: 92 kg  Vital Signs: Temp: 99 F (37.2 C) (10/18 2000) Temp Source: Oral (10/18 2000) BP: 92/66 (10/18 2200) Pulse Rate: 122 (10/18 2200)  Labs: Recent Labs    12/03/19 1956 12/03/19 2233 12/04/19 0034 12/04/19 0035 12/04/19 1245 12/04/19 2140  HGB 17.4*  --   --  15.3  --   --   HCT 52.1*  --   --  46.1  --   --   PLT 303  --   --  314  --   --   APTT 34  --   --   --   --   --   LABPROT 13.8  --   --   --   --   --   INR 1.1  --   --   --   --   --   HEPARINUNFRC  --   --   --   --   --  0.59  CREATININE 1.46*  --  1.53*  --  1.86*  --   CKTOTAL  --  2,183*  --   --   --   --   TROPONINIHS >27,000* >27,000* >27,000*  --   --   --     Estimated Creatinine Clearance: 48.3 mL/min (A) (by C-G formula based on SCr of 1.86 mg/dL (H)).   Medical History: Past Medical History:  Diagnosis Date  . Chronic kidney disease 04/2009   Kidney Transplant  . Diabetes mellitus   . GERD (gastroesophageal reflux disease)    as needed reflux  . Hypertension      Assessment: 59 year old male presented with STEMI s/p PCI to mid LAD. Plan for staged revascularization of left circumflex pending stabilization of renal function given kidney transplant. 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 Also with early apical thrombus. Consult for heparin with plan to transition to warfarin after invasive procedures.  1018 2140 HL 0.59   Goal of Therapy:  Heparin level 0.3-0.7 units/ml Monitor platelets by anticoagulation protocol: Yes   Plan:  Heparin level is therapeutic. Will continue current rate of heparin @ 1400 units/hr. Recheck heparin level in 6 hours. CBC daily while on heparin.   Oswald Hillock,  PharmD 12/04/2019,10:29 PM

## 2019-12-04 NOTE — Progress Notes (Signed)
eLink Physician-Brief Progress Note Patient Name: Gurtej Noyola. DOB: 1960/05/06 MRN: [id number]   Date of Service  12/04/2019  HPI/Events of Note  34M with T2DM and h/o renal transplant who presented with STEMI due to mid-LAD occlusion. He underwent cardiac catheterization emergently, with DES placed to mid-LAD. LVEF was estimated at 25%. Also noted was 3-vessel coronary disease which will require further management.  He is now in the ICU resting comfortably on room air.     eICU Interventions  # Cardiac: - STEMI s/p stent placement to mid-LAD: ASA 81, statin, BB (Coreg), Brilinta. - 3-vd: cardiology following, planning staged intervention. - Systolic CHF: Coreg as above. Losartan.  # Respiratory: - NAI  # Renal: - S/p renal transplant: Continue home prednisone and tacrolimus.  # Endocrine: - ISS. - Glipizide.  # GI: - Diabetic diet. - Transaminitis: Likely secondary to (cardiac) muscle necrosis. CTM.  DVT PPX: Enoxaparin ppx GI PPX: Not indicated Code Status: Full        Marily Lente Jaimy Kliethermes 12/04/2019, 1:18 AM

## 2019-12-04 NOTE — Progress Notes (Signed)
PROGRESS NOTE    Jenelle Mages.  JTT:[id number] DOB: [birth year] DOA: 12/03/2019 PCP: Dion Body, MD  Outpatient Specialists: [facility]    Brief Narrative:   From cardiology's initial consult: Casey [name] is a 59 year old African American male with history of kidney transplant in 2011 with stage IIIa chronic kidney disease, type 2 diabetes, hypertension, and hyperlipidemia.  He is not a smoker and is not aware of any previous cardiac history.  The patient reports that over the last 1 to 2 months he has experienced exertional chest pain and tightness that resolved with rest.  He did not inform family his family members or his primary care physician.  He had an episode of chest pain and tightness at rest about 1 month ago that lasted about 10 minutes.  He started having substernal chest burning and tightness feeling on Friday which continued since then.  The patient thought it was due to acid reflux but his symptoms did not improve.  Today, he started having chest pain especially with taking a deep breath.  He continued to have the same tightness feeling and thus he came to the emergency room for evaluation.  An EKG was performed which showed extensive anterior and anteroseptal Q waves with persistent ST elevation.  Given his symptoms and EKG changes, code STEMI was called.  At the time of my evaluation, the patient was having 5 out of 10 chest pain.  Surprisingly, his vital signs were reasonable including normal blood pressure which if anything was on the high side.  He was mildly tachycardic with a heart rate of 110 bpm.  Given his symptoms and EKG changes, I recommended proceeding with emergent cardiac catheterization and possible PCI.  I discussed this with the patient and his wife who was at the bedside.   Assessment & Plan:   Active Problems:   Acute ST elevation myocardial infarction (STEMI) due to occlusion of left anterior descending (LAD) coronary artery (HCC)    Type 2 diabetes mellitus with hyperlipidemia (HCC)   Hyperlipidemia   History of renal transplant   CKD (chronic kidney disease), stage III (HCC)   STEMI (ST elevation myocardial infarction) ([place])   # STEMI  Chest pain for 1 month prior to presentation. Taken emergently to cath lab given pain and ST elevations. Cath showed multi-vessel disease. One DES placed to mid LAD. Left distal circumflex will also need to be treated but cardiology opted not to intervene 10/17 given dye load and ckd - ICU for now - continue aspirin, brillinta, rosuvastatin, losartan, carvedilol  # Acute systolic heart failure 2/2 ischemia. EF 15% at time of cath. Does not appear fluid overloaded - echo ordered - continue carvedilol, losartan  # CKD stage 3 Status post kidney transplant. Dye load with cath. gfr this morning stable at 49, cr 1.53 - cont home tacrolimus and prednisone - nephrology consult to ensure kidney optimization  # T2DM A1c 7.7 08/2019. Here sugars upper 100s - hold home glipizide, start SSI - lantus 5 qhs - consider eventual transition to sglt2i given ckd, CHF  # HTN Here bps wnl - hold home clonidine and amlodipine and labetalol - cont carvedilol, losartan  # Transaminitis LFTS wnl earlier this year at [facility], here ast 200s alt under 100. Likely 2/2 hypoperfusion - trend, further w/u if elevation persists   DVT prophylaxis: lovenox Code Status: full Family Communication: none at bedside  Status is: Inpatient  Remains inpatient appropriate because:Inpatient level of care appropriate due to  severity of illness   Dispo: The patient is from: Home              Anticipated d/c is to: TBD (pt/ot consults ordered)              Anticipated d/c date is: 3 days              Patient currently is not medically stable to d/c.        Consultants:  Cardiology, nephrology  Procedures: Left heart cath 12/03/19  Antimicrobials:  none    Subjective: This morning mild chest  discomfort. Has appetite. No sob. No n/v/d. No fevers.   Objective: Vitals:   12/03/19 2300 12/04/19 0000 12/04/19 0200 12/04/19 0300  BP: 106/67 104/63 109/74 120/86  Pulse: (!) 104 95 (!) 101 99  Resp: (!) 21 (!) 38 (!) 35 20  Temp:    97.9 F (36.6 C)  TempSrc:    Axillary  SpO2: 96% 94% 97% 97%  Weight:      Height:        Intake/Output Summary (Last 24 hours) at 12/04/2019 0813 Last data filed at 12/04/2019 0400 Gross per 24 hour  Intake --  Output 400 ml  Net -400 ml   Filed Weights   12/03/19 1955  Weight: 92.5 kg    Examination:  General exam: Appears calm and comfortable  Respiratory system: Clear to auscultation. Respiratory effort normal. Cardiovascular system: S1 & S2 heard, mild systolic murmur, mild tachycardia Gastrointestinal system: Abdomen is nondistended, soft and nontender. No organomegaly or masses felt. Normal bowel sounds heard. Central nervous system: Alert and oriented. No focal neurological deficits. Extremities: Symmetric 5 x 5 power. Skin: No rashes, lesions or ulcers Psychiatry: Judgement and insight appear normal. Mood & affect appropriate.     Data Reviewed: I have personally reviewed following labs and imaging studies  CBC: Recent Labs  Lab 12/03/19 1956 12/04/19 0035  WBC 23.9* 22.9*  NEUTROABS  --  18.5*  HGB 17.4* 15.3  HCT 52.1* 46.1  MCV 84.2 83.4  PLT 303 202   Basic Metabolic Panel: Recent Labs  Lab 12/03/19 1956 12/04/19 0034  NA 136 134*  K 4.7 4.8  CL 100 102  CO2 25 22  GLUCOSE 221* 228*  BUN 18 20  CREATININE 1.46* 1.53*  CALCIUM 10.3 9.5  MG  --  2.1  PHOS  --  2.9   GFR: Estimated Creatinine Clearance: 58.8 mL/min (A) (by C-G formula based on SCr of 1.53 mg/dL (H)). Liver Function Tests: Recent Labs  Lab 12/03/19 1956 12/04/19 0034  AST 267* 245*  ALT 82* 67*  ALKPHOS 89 75  BILITOT 1.4* 1.2  PROT 8.5* 6.7  ALBUMIN 4.4 3.6   No results for input(s): LIPASE, AMYLASE in the last 168  hours. No results for input(s): AMMONIA in the last 168 hours. Coagulation Profile: Recent Labs  Lab 12/03/19 1956  INR 1.1   Cardiac Enzymes: Recent Labs  Lab 12/03/19 2233  CKTOTAL 2,183*   BNP (last 3 results) No results for input(s): PROBNP in the last 8760 hours. HbA1C: No results for input(s): HGBA1C in the last 72 hours. CBG: Recent Labs  Lab 12/03/19 2205 12/04/19 0359 12/04/19 0714  GLUCAP 212* 193* 185*   Lipid Profile: Recent Labs    12/03/19 1956  CHOL 284*  HDL 75  LDLCALC 185*  TRIG 121  CHOLHDL 3.8   Thyroid Function Tests: Recent Labs    12/04/19 0034  TSH 0.630  Anemia Panel: No results for input(s): VITAMINB12, FOLATE, FERRITIN, TIBC, IRON, RETICCTPCT in the last 72 hours. Urine analysis:    Component Value Date/Time   COLORURINE YELLOW (A) 05/19/2015 1533   APPEARANCEUR CLOUDY (A) 05/19/2015 1533   LABSPEC 1.013 05/19/2015 1533   PHURINE 6.0 05/19/2015 1533   GLUCOSEU NEGATIVE 05/19/2015 1533   HGBUR 2+ (A) 05/19/2015 1533   BILIRUBINUR NEGATIVE 05/19/2015 1533   KETONESUR 1+ (A) 05/19/2015 1533   PROTEINUR 100 (A) 05/19/2015 1533   NITRITE POSITIVE (A) 05/19/2015 1533   LEUKOCYTESUR 3+ (A) 05/19/2015 1533   Sepsis Labs: @LABRCNTIP (procalcitonin:4,lacticidven:4)  ) Recent Results (from the past 240 hour(s))  Respiratory Panel by RT PCR (Flu A&B, Covid) - Nasopharyngeal Swab     Status: None   Collection Time: 12/03/19  7:56 PM   Specimen: Nasopharyngeal Swab  Result Value Ref Range Status   SARS Coronavirus 2 by RT PCR NEGATIVE NEGATIVE Final    Comment: (NOTE) SARS-CoV-2 target nucleic acids are NOT DETECTED.  The SARS-CoV-2 RNA is generally detectable in upper respiratoy specimens during the acute phase of infection. The lowest concentration of SARS-CoV-2 viral copies this assay can detect is 131 copies/mL. A negative result does not preclude SARS-Cov-2 infection and should not be used as the sole basis for treatment  or other patient management decisions. A negative result may occur with  improper specimen collection/handling, submission of specimen other than nasopharyngeal swab, presence of viral mutation(s) within the areas targeted by this assay, and inadequate number of viral copies (<131 copies/mL). A negative result must be combined with clinical observations, patient history, and epidemiological information. The expected result is Negative.  Fact Sheet for Patients:  PinkCheek.be  Fact Sheet for Healthcare Providers:  GravelBags.it  This test is no t yet approved or cleared by the [place] FDA and  has been authorized for detection and/or diagnosis of SARS-CoV-2 by FDA under an Emergency Use Authorization (EUA). This EUA will remain  in effect (meaning this test can be used) for the duration of the COVID-19 declaration under Section 564(b)(1) of the Act, 21 U.S.C. section 360bbb-3(b)(1), unless the authorization is terminated or revoked sooner.     Influenza A by PCR NEGATIVE NEGATIVE Final   Influenza B by PCR NEGATIVE NEGATIVE Final    Comment: (NOTE) The Xpert Xpress SARS-CoV-2/FLU/RSV assay is intended as an aid in  the diagnosis of influenza from Nasopharyngeal swab specimens and  should not be used as a sole basis for treatment. Nasal washings and  aspirates are unacceptable for Xpert Xpress SARS-CoV-2/FLU/RSV  testing.  Fact Sheet for Patients: PinkCheek.be  Fact Sheet for Healthcare Providers: GravelBags.it  This test is not yet approved or cleared by the [place] FDA and  has been authorized for detection and/or diagnosis of SARS-CoV-2 by  FDA under an Emergency Use Authorization (EUA). This EUA will remain  in effect (meaning this test can be used) for the duration of the  Covid-19 declaration under Section 564(b)(1) of the Act, 21  U.S.C.  section 360bbb-3(b)(1), unless the authorization is  terminated or revoked. Performed at [facility], [address] [place], [place] [address]   MRSA PCR Screening     Status: None   Collection Time: 12/04/19 12:03 AM   Specimen: Nasopharyngeal  Result Value Ref Range Status   MRSA by PCR NEGATIVE NEGATIVE Final    Comment:        The GeneXpert MRSA Assay (FDA approved for NASAL specimens only), is one component  of a comprehensive MRSA colonization surveillance program. It is not intended to diagnose MRSA infection nor to guide or monitor treatment for MRSA infections. Performed at [facility], [address], [place], [place] [address]          Radiology Studies: CARDIAC CATHETERIZATION  Result Date: 12/03/2019  There is severe left ventricular systolic dysfunction.  LV end diastolic pressure is moderately elevated.  The left ventricular ejection fraction is less than 25% by visual estimate.  Mid Cx to Dist Cx lesion is 95% stenosed.  3rd Mrg lesion is 80% stenosed.  A drug-eluting stent was successfully placed using a STENT RESOLUTE ONYX 2.5X15.  Prox Cx lesion is 30% stenosed.  Mid RCA to Dist RCA lesion is 99% stenosed.  Mid LAD-1 lesion is 100% stenosed.  Post intervention, there is a 0% residual stenosis.  Mid LAD-2 lesion is 40% stenosed.  Prox LAD lesion is 30% stenosed.  1.  Late presenting anterior ST elevation myocardial infarction.  The culprit is an occluded mid LAD.  However, the patient also has severe three-vessel coronary artery disease.  There is also significant myocardial bridge in the distal LAD. 2.  Severely reduced LV systolic function with an EF of 15% with mid to distal anterior, apical and distal inferior akinesis.  No evidence of LV thrombus. 3.  High normal systolic blood pressure and moderately elevated left ventricular end-diastolic pressure at 24 mmHg. 3.  Successful angioplasty and drug-eluting stent placement to the  mid LAD. Recommendations: In spite of late presentation and significant underlying three-vessel coronary artery disease, the patient was not in cardiogenic shock.  I elected to revascularize the LAD in spite of late presentation given continued chest pain and EKG changes.  The patient reported resolution of chest pain by the end of the procedure. He continues to have significant disease involving the RCA and left circumflex.  The whole distal RCA is diffusely diseased and not revascularizable.  The left circumflex can be treated with staged PCI before hospital discharge.  I elected not to do that today given underlying chronic kidney disease and kidney transplant status. Continue dual antiplatelet therapy for at least 1 year. Aggressive treatment of risk factors. We will avoid hydration given significant reduced EF and at the same time will only diuresis if needed based on his respiratory status to decrease the chance of contrast-induced nephropathy.   DG Chest Portable 1 View  Result Date: 12/03/2019 CLINICAL DATA:  Chest pain EXAM: PORTABLE CHEST 1 VIEW COMPARISON:  03/06/2011 FINDINGS: Low lung volumes. Heart and mediastinal contours are within normal limits. No focal opacities or effusions. No acute bony abnormality. IMPRESSION: No active disease. Electronically Signed   By: Rolm Baptise M.D.   On: 12/03/2019 20:30        Scheduled Meds: . aspirin  81 mg Oral Daily  . carvedilol  3.125 mg Oral BID WC  . Chlorhexidine Gluconate Cloth  6 each Topical Daily  . enoxaparin (LOVENOX) injection  40 mg Subcutaneous Q24H  . insulin aspart  0-9 Units Subcutaneous Q4H  . losartan  25 mg Oral Daily  . predniSONE  5 mg Oral Q breakfast  . rosuvastatin  20 mg Oral q1800  . sodium chloride flush  3 mL Intravenous Q12H  . tacrolimus  5 mg Oral BID  . ticagrelor  90 mg Oral BID   Continuous Infusions: . sodium chloride 20 mL/hr at 12/03/19 2018  . sodium chloride       LOS: 1 day  Time spent:  90 min    Desma Maxim, MD [facility]   If 7PM-7AM, please contact night-coverage [email or web address] Password [facility] 12/04/2019, 8:13 AM

## 2019-12-04 NOTE — Progress Notes (Signed)
ANTICOAGULATION CONSULT NOTE  Pharmacy Consult for heparin Indication: apical thrombus  No Known Allergies  Patient Measurements: Height: 6\' 1"  (185.4 cm) Weight: 92.5 kg (203 lb 14.8 oz) IBW/kg (Calculated) : 79.9 Heparin Dosing Weight: 92 kg  Vital Signs: Temp: 97.9 F (36.6 C) (10/18 1200) Temp Source: Oral (10/18 1200) BP: 97/70 (10/18 1200) Pulse Rate: 101 (10/18 1300)  Labs: Recent Labs    12/03/19 1956 12/03/19 2233 12/04/19 0034 12/04/19 0035 12/04/19 1245  HGB 17.4*  --   --  15.3  --   HCT 52.1*  --   --  46.1  --   PLT 303  --   --  314  --   APTT 34  --   --   --   --   LABPROT 13.8  --   --   --   --   INR 1.1  --   --   --   --   CREATININE 1.46*  --  1.53*  --  1.86*  CKTOTAL  --  2,183*  --   --   --   TROPONINIHS >27,000* >27,000* >27,000*  --   --     Estimated Creatinine Clearance: 48.3 mL/min (A) (by C-G formula based on SCr of 1.86 mg/dL (H)).   Medical History: Past Medical History:  Diagnosis Date  . Chronic kidney disease 04/2009   Kidney Transplant  . Diabetes mellitus   . GERD (gastroesophageal reflux disease)    as needed reflux  . Hypertension      Assessment: 59 year old male presented with STEMI s/p PCI to mid LAD. Plan for staged revascularization of left circumflex pending stabilization of renal function given kidney transplant. 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 Also with early apical thrombus. Consult for heparin with plan to transition to warfarin after invasive procedures.  Goal of Therapy:  Heparin level 0.3-0.7 units/ml Monitor platelets by anticoagulation protocol: Yes   Plan:  Heparin 4000 unit bolus (given recent Lovenox administration this morning) followed by heparin drip at 1400 units/hr. HL ordered for 2100. CBC with morning labs.  Tawnya Crook, PharmD 12/04/2019,3:25 PM

## 2019-12-04 NOTE — Consult Note (Signed)
[address] [place], [place] [address] Phone [phone]. Fax [phone]  Date: 12/04/2019                  Patient Name:  Casey [name].  MRN: [id number]  DOB: February 12, 1961  Age / Sex: 59 y.o., male         PCP: Dion Body, MD                 Service Requesting Consult: IM/ Gwynne Edinger, MD                 Reason for Consult: ARF in renal transplant patient            History of Present Illness: Patient is a 59 y.o. male with medical problems of end-stage renal disease, status post kidney transplant 2011, diabetes, hypertension, hyperlipidemia who was admitted to [facility] on 12/03/2019 for evaluation of chest pain and is found to have STEMI.  Information is obtained from patient and his wife Started to have chest pain starting Friday evening.  He thought it was heartburn,  it was not getting better.  He then started to have chest tightness on Saturday. Then presented to the emergency room on Sunday evening He was diagnosed with STEMI Unfortunately he was noted to have Q waves on EKG, still having chest pain therefore was taken for emergent cardiac catheterization.  Severe three-vessel coronary disease was noted along with occluded mid LAD.  One drug-eluting stent was placed.  Further intervention is planned in left distal left circumflex.  Right coronary is diffusely diseased and not suitable for revascularization.  Collaterals from LAD were noted.  Dual antiplatelet therapy with aspirin and ticagrelor was recommended along with high-dose statin.  Patient also has acute systolic heart failure with severe ischemic cardiomyopathy.  EF of 15% is noted.  Medications: Outpatient medications: Medications Prior to Admission  Medication Sig Dispense Refill Last Dose  . amLODipine (NORVASC) 5 MG tablet Take 10 mg by mouth daily.      . cetirizine (ZYRTEC) 5 MG tablet Take 5 mg by mouth daily as needed for allergies.      . Cholecalciferol 25 MCG (1000 UT) tablet  Take 1,000 Units by mouth daily.     . cloNIDine (CATAPRES) 0.1 MG tablet Take 0.2 mg by mouth every 12 (twelve) hours.      Marland Kitchen glipiZIDE (GLUCOTROL XL) 10 MG 24 hr tablet Take 10 mg by mouth 2 (two) times daily.     Marland Kitchen labetalol (NORMODYNE) 300 MG tablet Take 300 mg by mouth 2 (two) times daily.      Marland Kitchen lisinopril (ZESTRIL) 20 MG tablet Take 20 mg by mouth daily.     . predniSONE (DELTASONE) 5 MG tablet Take 5 mg by mouth Daily.     . rosuvastatin (CRESTOR) 20 MG tablet Take 20 mg by mouth at bedtime.     . tacrolimus (PROGRAF) 1 MG capsule Take 5 mg by mouth 2 (two) times daily.        Current medications: Current Facility-Administered Medications  Medication Dose Route Frequency Provider Last Rate Last Admin  . 0.9 %  sodium chloride infusion   Intravenous Continuous Wellington Hampshire, MD 20 mL/hr at 12/03/19 2018 New Bag at 12/03/19 2018  . 0.9 %  sodium chloride infusion  250 mL Intravenous PRN Kathlyn Sacramento A, MD      . acetaminophen (TYLENOL) tablet 650 mg  650 mg Oral Q4H PRN Wellington Hampshire, MD      .  ALPRAZolam Duanne Moron) tablet 0.25 mg  0.25 mg Oral QHS PRN Athena Masse, MD   0.25 mg at 12/04/19 0053  . aspirin chewable tablet 81 mg  81 mg Oral Daily Kathlyn Sacramento A, MD   81 mg at 12/04/19 1110  . carvedilol (COREG) tablet 3.125 mg  3.125 mg Oral BID WC Kathlyn Sacramento A, MD   3.125 mg at 12/04/19 0719  . Chlorhexidine Gluconate Cloth 2 % PADS 6 each  6 each Topical Daily Wellington Hampshire, MD   6 each at 12/04/19 1110  . enoxaparin (LOVENOX) injection 40 mg  40 mg Subcutaneous Q24H Kathlyn Sacramento A, MD   40 mg at 12/04/19 1109  . HYDROcodone-acetaminophen (NORCO/VICODIN) 5-325 MG per tablet 1-2 tablet  1-2 tablet Oral Q4H PRN Toy Baker, MD   2 tablet at 12/03/19 2247  . insulin aspart (novoLOG) injection 0-9 Units  0-9 Units Subcutaneous Q4H Toy Baker, MD   2 Units at 12/04/19 0720  . insulin glargine (LANTUS) injection 5 Units  5 Units Subcutaneous Daily  Gwynne Edinger, MD   5 Units at 12/04/19 1109  . ondansetron (ZOFRAN) injection 4 mg  4 mg Intravenous Q6H PRN Kathlyn Sacramento A, MD      . predniSONE (DELTASONE) tablet 5 mg  5 mg Oral Q breakfast Kathlyn Sacramento A, MD   5 mg at 12/04/19 0719  . rosuvastatin (CRESTOR) tablet 20 mg  20 mg Oral q1800 Arida, Muhammad A, MD      . sodium chloride flush (NS) 0.9 % injection 3 mL  3 mL Intravenous Q12H Kathlyn Sacramento A, MD   3 mL at 12/04/19 1110  . sodium chloride flush (NS) 0.9 % injection 3 mL  3 mL Intravenous PRN Kathlyn Sacramento A, MD      . tacrolimus (PROGRAF) capsule 5 mg  5 mg Oral BID Kathlyn Sacramento A, MD   5 mg at 12/04/19 1109  . ticagrelor (BRILINTA) tablet 90 mg  90 mg Oral BID Wellington Hampshire, MD   90 mg at 12/04/19 1109   Facility-Administered Medications Ordered in Other Encounters  Medication Dose Route Frequency Provider Last Rate Last Admin  . perflutren lipid microspheres (DEFINITY) IV suspension  1-10 mL Intravenous PRN Wellington Hampshire, MD   2 mL at 12/04/19 1104      Allergies: No Known Allergies    Past Medical History: Past Medical History:  Diagnosis Date  . Chronic kidney disease 04/2009   Kidney Transplant  . Diabetes mellitus   . GERD (gastroesophageal reflux disease)    as needed reflux  . Hypertension      Past Surgical History: Past Surgical History:  Procedure Laterality Date  . 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 CORONARY/GRAFT ACUTE MI REVASCULARIZATION N/A 12/03/2019   Procedure: Coronary/Graft Acute MI Revascularization;  Surgeon: Wellington Hampshire, MD;  Location: [facility];  Service: Cardiovascular;  Laterality: N/A;  . DIALYSIS FISTULA CREATION     last used 04/2009  . INSERTION OF DIALYSIS CATHETER     cordis dialysis catheter placement  .  KIDNEY TRANSPLANT  2011  . LEFT HEART CATH AND CORONARY ANGIOGRAPHY N/A 12/03/2019   Procedure: LEFT HEART CATH AND CORONARY ANGIOGRAPHY;  Surgeon: Wellington Hampshire, MD;  Location: [facility];  Service:  Cardiovascular;  Laterality: N/A;     Family History: Family History  Problem Relation Age of Onset  . Diabetes Mother   . Hyperlipidemia Mother   . Cancer Father      Social History: Social History   Socioeconomic History  . Marital status: Married    Spouse name: Not on file  . Number of children: Not on file  . Years of education: Not on file  . Highest education level: Not on file  Occupational History  . Not on file  Tobacco Use  . Smoking status: Never Smoker  . Smokeless tobacco: Never Used  Substance and Sexual Activity  . Alcohol use: Yes    Comment: occassi  . Drug use: No  . Sexual activity: Not on file  Other Topics Concern  . Not on file  Social History Narrative  . Not on file   Social Determinants of Health   Financial Resource Strain:   . Difficulty of Paying Living Expenses: Not on file  Food Insecurity:   . Worried About Charity fundraiser in the Last Year: Not on file  . Ran Out of Food in the Last Year: Not on file  Transportation Needs:   . Lack of Transportation (Medical): Not on file  . Lack of Transportation (Non-Medical): Not on file  Physical Activity:   . Days of Exercise per Week: Not on file  . Minutes of Exercise per Session: Not on file  Stress:   . Feeling of Stress : Not on file  Social Connections:   . Frequency of Communication with Friends and Family: Not on file  . Frequency of Social Gatherings with Friends and Family: Not on file  . Attends Religious Services: Not on file  . Active Member of Clubs or Organizations: Not on file  . Attends Archivist Meetings: Not on file  . Marital Status: Not on file  Intimate Partner Violence:   . Fear of Current or Ex-Partner: Not on file  . Emotionally  Abused: Not on file  . Physically Abused: Not on file  . Sexually Abused: Not on file     Review of Systems: Gen: Denies any fevers or chills HEENT: No vision or hearing complaints CV: Chest pain as described above.  Improved after stent placement Resp: No shortness of breath GI: No nausea or vomiting.  Decreased appetite GU : No problems with voiding MS: No complaints Derm:    No complaints Psych: No complaints Heme: No complaints Neuro: No complaints Endocrine.  Diabetes.  Denies thyroid problems  Vital Signs: Blood pressure (!) 102/47, pulse 95, temperature 98.4 F (36.9 C), temperature source Oral, resp. rate (!) 42, height 6\' 1"  (1.854 m), weight 92.5 kg, SpO2 100 %.   Intake/Output Summary (Last 24 hours) at 12/04/2019 1201 Last data filed at 12/04/2019 0400 Gross per 24 hour  Intake --  Output 400 ml  Net -400 ml    Weight trends: [employer]   12/03/19 1955  Weight: 92.5 kg    Physical Exam: General:  No acute distress, lying in the bed  HEENT  anicteric, moist oral mucous membranes  Lungs:  Mild bilateral crackles, unable to take deep breath  Heart::  Regular, tachycardic  Abdomen:  Soft, nontender, nondistended  Extremities:  Trace edema  Neurologic:  Alert, oriented  Skin:  Warm, dry.  No acute rashes    Lab results: Basic Metabolic Panel: Recent Labs  Lab 12/03/19 1956 12/04/19 0034  NA 136 134*  K 4.7 4.8  CL 100 102  CO2 25 22  GLUCOSE 221* 228*  BUN 18 20  CREATININE 1.46* 1.53*  CALCIUM 10.3 9.5  MG  --  2.1  PHOS  --  2.9    Liver Function Tests: Recent Labs  Lab 12/04/19 0034  AST 245*  ALT 67*  ALKPHOS 75  BILITOT 1.2  PROT 6.7  ALBUMIN 3.6   No results for input(s): LIPASE, AMYLASE in the last 168 hours. No results for input(s): AMMONIA in the last 168 hours.  CBC: Recent Labs  Lab 12/03/19 1956 12/04/19 0035  WBC 23.9* 22.9*  NEUTROABS  --  18.5*  HGB 17.4* 15.3  HCT 52.1* 46.1  MCV 84.2 83.4  PLT 303  314    Cardiac Enzymes: Recent Labs  Lab 12/03/19 2233  CKTOTAL 2,183*    BNP: Invalid input(s): POCBNP  CBG: Recent Labs  Lab 12/03/19 2205 12/04/19 0359 12/04/19 0714 12/04/19 1155  GLUCAP 212* 193* 185* 210*    Microbiology: Recent Results (from the past 720 hour(s))  Respiratory Panel by RT PCR (Flu A&B, Covid) - Nasopharyngeal Swab     Status: None   Collection Time: 12/03/19  7:56 PM   Specimen: Nasopharyngeal Swab  Result Value Ref Range Status   SARS Coronavirus 2 by RT PCR NEGATIVE NEGATIVE Final    Comment: (NOTE) SARS-CoV-2 target nucleic acids are NOT DETECTED.  The SARS-CoV-2 RNA is generally detectable in upper respiratoy specimens during the acute phase of infection. The lowest concentration of SARS-CoV-2 viral copies this assay can detect is 131 copies/mL. A negative result does not preclude SARS-Cov-2 infection and should not be used as the sole basis for treatment or other patient management decisions. A negative result may occur with  improper specimen collection/handling, submission of specimen other than nasopharyngeal swab, presence of viral mutation(s) within the areas targeted by this assay, and inadequate number of viral copies (<131 copies/mL). A negative result must be combined with clinical observations, patient history, and epidemiological information. The expected result is Negative.  Fact Sheet for Patients:  PinkCheek.be  Fact Sheet for Healthcare Providers:  GravelBags.it  This test is no t yet approved or cleared by the [place] FDA and  has been authorized for detection and/or diagnosis of SARS-CoV-2 by FDA under an Emergency Use Authorization (EUA). This EUA will remain  in effect (meaning this test can be used) for the duration of the COVID-19 declaration under Section 564(b)(1) of the Act, 21 U.S.C. section 360bbb-3(b)(1), unless the authorization is terminated  or revoked sooner.     Influenza A by PCR NEGATIVE NEGATIVE Final   Influenza B by PCR NEGATIVE NEGATIVE Final    Comment: (NOTE) The Xpert Xpress SARS-CoV-2/FLU/RSV assay is intended as an aid in  the diagnosis of influenza from Nasopharyngeal swab specimens and  should not be used as a sole basis for treatment. Nasal washings and  aspirates are unacceptable for Xpert Xpress SARS-CoV-2/FLU/RSV  testing.  Fact Sheet for Patients: PinkCheek.be  Fact Sheet for Healthcare Providers: GravelBags.it  This test is not yet approved or cleared by the [place] FDA and  has been authorized for detection and/or diagnosis of SARS-CoV-2 by  FDA under an Emergency Use Authorization (EUA). This EUA will remain  in effect (meaning this test can be used) for the duration of the  Covid-19 declaration under Section 564(b)(1) of the Act, 21  U.S.C. section 360bbb-3(b)(1), unless the authorization is  terminated or revoked. Performed at [facility]  [facility], [place], [place] [address]   MRSA PCR Screening     Status: None   Collection Time: 12/04/19 12:03 AM   Specimen: Nasopharyngeal  Result Value Ref Range Status   MRSA by PCR NEGATIVE NEGATIVE Final    Comment:        The GeneXpert MRSA Assay (FDA approved for NASAL specimens only), is one component of a comprehensive MRSA colonization surveillance program. It is not intended to diagnose MRSA infection nor to guide or monitor treatment for MRSA infections. Performed at [facility], [address] [place], [place] [address]      Coagulation Studies: Recent Labs    12/03/19 1956  LABPROT 13.8  INR 1.1    Urinalysis: No results for input(s): COLORURINE, LABSPEC, PHURINE, GLUCOSEU, HGBUR, BILIRUBINUR, KETONESUR, PROTEINUR, UROBILINOGEN, NITRITE, LEUKOCYTESUR in the last 72 hours.  Invalid input(s): APPERANCEUR      Imaging: CARDIAC  CATHETERIZATION  Result Date: 12/03/2019  There is severe left ventricular systolic dysfunction.  LV end diastolic pressure is moderately elevated.  The left ventricular ejection fraction is less than 25% by visual estimate.  Mid Cx to Dist Cx lesion is 95% stenosed.  3rd Mrg lesion is 80% stenosed.  A drug-eluting stent was successfully placed using a STENT RESOLUTE ONYX 2.5X15.  Prox Cx lesion is 30% stenosed.  Mid RCA to Dist RCA lesion is 99% stenosed.  Mid LAD-1 lesion is 100% stenosed.  Post intervention, there is a 0% residual stenosis.  Mid LAD-2 lesion is 40% stenosed.  Prox LAD lesion is 30% stenosed.  1.  Late presenting anterior ST elevation myocardial infarction.  The culprit is an occluded mid LAD.  However, the patient also has severe three-vessel coronary artery disease.  There is also significant myocardial bridge in the distal LAD. 2.  Severely reduced LV systolic function with an EF of 15% with mid to distal anterior, apical and distal inferior akinesis.  No evidence of LV thrombus. 3.  High normal systolic blood pressure and moderately elevated left ventricular end-diastolic pressure at 24 mmHg. 3.  Successful angioplasty and drug-eluting stent placement to the mid LAD. Recommendations: In spite of late presentation and significant underlying three-vessel coronary artery disease, the patient was not in cardiogenic shock.  I elected to revascularize the LAD in spite of late presentation given continued chest pain and EKG changes.  The patient reported resolution of chest pain by the end of the procedure. He continues to have significant disease involving the RCA and left circumflex.  The whole distal RCA is diffusely diseased and not revascularizable.  The left circumflex can be treated with staged PCI before hospital discharge.  I elected not to do that today given underlying chronic kidney disease and kidney transplant status. Continue dual antiplatelet therapy for at least 1  year. Aggressive treatment of risk factors. We will avoid hydration given significant reduced EF and at the same time will only diuresis if needed based on his respiratory status to decrease the chance of contrast-induced nephropathy.   DG Chest Portable 1 View  Result Date: 12/03/2019 CLINICAL DATA:  Chest pain EXAM: PORTABLE CHEST 1 VIEW COMPARISON:  03/06/2011 FINDINGS: Low lung volumes. Heart and mediastinal contours are within normal limits. No focal opacities or effusions. No acute bony abnormality. IMPRESSION: No active disease. Electronically Signed   By: Rolm Baptise M.D.   On: 12/03/2019 20:30      Assessment & Plan: Pt is a 59 y.o. 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  was admitted on 12/03/2019 with Acute ST elevation myocardial infarction (STEMI) due to occlusion of left anterior descending (LAD) coronary artery (HCC) [I21.02] STEMI (ST elevation myocardial infarction) ([place]) [I21.3]   #Renal transplant status # Acute Kidney Injury  Deceased donor renal transplant 2011 at [facility] Creatinine 1.2-1.3 at baseline Chronic regimen includes prednisone 5 mg daily Prograf 5 mg twice a day No CellCept due to history of BK viremia  Serum creatinine today is at 1.53 compared to 1.46 from yesterday We will continue to monitor daily Continue Prograf 5 mg twice a day at home dose- will obtain level in am Continue prednisone Patient is at risk for contrast-induced nephropathy from repeat dye exposure Recommend gentle IV (30-50 /hr), based on what patient can tolerate in context of acute systolic CHF noted during cardiac catheterization. Await today's echo results   LOS: [place] 10/18/202112:01 PM    Note: This note was prepared with Dragon dictation. Any  transcription errors are unintentional

## 2019-12-04 NOTE — Progress Notes (Signed)
Progress Note  Patient Name: Casey [name]. Date of Encounter: 12/04/2019  [facility] Cardiologist: new Fletcher Anon)  Subjective   He reports minimal chest discomfort today.  He does complain of fatigue and some shortness of breath.  Blood pressure is on the low side especially after he was given carvedilol.  Inpatient Medications    Scheduled Meds: . aspirin  81 mg Oral Daily  . carvedilol  3.125 mg Oral BID WC  . Chlorhexidine Gluconate Cloth  6 each Topical Daily  . insulin aspart  0-9 Units Subcutaneous Q4H  . insulin glargine  5 Units Subcutaneous Daily  . predniSONE  5 mg Oral Q breakfast  . rosuvastatin  20 mg Oral q1800  . sodium chloride flush  3 mL Intravenous Q12H  . tacrolimus  5 mg Oral BID  . ticagrelor  90 mg Oral BID   Continuous Infusions: . sodium chloride 20 mL/hr at 12/03/19 2018  . sodium chloride    . sodium chloride     PRN Meds: sodium chloride, acetaminophen, ALPRAZolam, HYDROcodone-acetaminophen, ondansetron (ZOFRAN) IV, sodium chloride flush   Vital Signs    Vitals:   12/04/19 0935 12/04/19 1000 12/04/19 1100 12/04/19 1200  BP: (!) 100/59 (!) 102/47 99/72 97/70   Pulse: 92 95 94 99  Resp: 16 (!) 42 19 (!) 43  Temp:    97.9 F (36.6 C)  TempSrc:    Oral  SpO2: 97% 100% 99% 98%  Weight:      Height:        Intake/Output Summary (Last 24 hours) at 12/04/2019 1255 Last data filed at 12/04/2019 0400 Gross per 24 hour  Intake --  Output 400 ml  Net -400 ml   Last 3 Weights 12/03/2019 05/19/2015 03/24/2011  Weight (lbs) 203 lb 14.8 oz 204 lb 214 lb  Weight (kg) 92.5 kg 92.534 kg 97.07 kg  Some encounter information is confidential and restricted. Go to Review Flowsheets activity to see all data.      Telemetry    Sinus rhythm with intermittent sinus tachycardia.- Personally Reviewed  ECG    Not done - Personally Reviewed  Physical Exam   GEN: No acute distress.   Neck: No JVD Cardiac: RRR, no murmurs, rubs, or gallops.   Respiratory: Clear to auscultation bilaterally. GI: Soft, nontender, non-distended  MS: No edema; No deformity. Neuro:  Nonfocal  Psych: Normal affect  Right radial pulses normal with no hematoma.  Labs    High Sensitivity Troponin:   Recent Labs  Lab 12/03/19 1956 12/03/19 2233 12/04/19 0034  TROPONINIHS >27,000* >27,000* >27,000*      Chemistry Recent Labs  Lab 12/03/19 1956 12/04/19 0034  NA 136 134*  K 4.7 4.8  CL 100 102  CO2 25 22  GLUCOSE 221* 228*  BUN 18 20  CREATININE 1.46* 1.53*  CALCIUM 10.3 9.5  PROT 8.5* 6.7  ALBUMIN 4.4 3.6  AST 267* 245*  ALT 82* 67*  ALKPHOS 89 75  BILITOT 1.4* 1.2  GFRNONAA 52* 49*  ANIONGAP 11 10     Hematology Recent Labs  Lab 12/03/19 1956 12/04/19 0035  WBC 23.9* 22.9*  RBC 6.19* 5.53  HGB 17.4* 15.3  HCT 52.1* 46.1  MCV 84.2 83.4  MCH 28.1 27.7  MCHC 33.4 33.2  RDW 14.6 14.6  PLT 303 314    BNPNo results for input(s): BNP, PROBNP in the last 168 hours.   DDimer No results for input(s): DDIMER in the last 168 hours.  Radiology    CARDIAC CATHETERIZATION  Result Date: 12/03/2019  There is severe left ventricular systolic dysfunction.  LV end diastolic pressure is moderately elevated.  The left ventricular ejection fraction is less than 25% by visual estimate.  Mid Cx to Dist Cx lesion is 95% stenosed.  3rd Mrg lesion is 80% stenosed.  A drug-eluting stent was successfully placed using a STENT RESOLUTE ONYX 2.5X15.  Prox Cx lesion is 30% stenosed.  Mid RCA to Dist RCA lesion is 99% stenosed.  Mid LAD-1 lesion is 100% stenosed.  Post intervention, there is a 0% residual stenosis.  Mid LAD-2 lesion is 40% stenosed.  Prox LAD lesion is 30% stenosed.  1.  Late presenting anterior ST elevation myocardial infarction.  The culprit is an occluded mid LAD.  However, the patient also has severe three-vessel coronary artery disease.  There is also significant myocardial bridge in the distal LAD. 2.  Severely  reduced LV systolic function with an EF of 15% with mid to distal anterior, apical and distal inferior akinesis.  No evidence of LV thrombus. 3.  High normal systolic blood pressure and moderately elevated left ventricular end-diastolic pressure at 24 mmHg. 3.  Successful angioplasty and drug-eluting stent placement to the mid LAD. Recommendations: In spite of late presentation and significant underlying three-vessel coronary artery disease, the patient was not in cardiogenic shock.  I elected to revascularize the LAD in spite of late presentation given continued chest pain and EKG changes.  The patient reported resolution of chest pain by the end of the procedure. He continues to have significant disease involving the RCA and left circumflex.  The whole distal RCA is diffusely diseased and not revascularizable.  The left circumflex can be treated with staged PCI before hospital discharge.  I elected not to do that today given underlying chronic kidney disease and kidney transplant status. Continue dual antiplatelet therapy for at least 1 year. Aggressive treatment of risk factors. We will avoid hydration given significant reduced EF and at the same time will only diuresis if needed based on his respiratory status to decrease the chance of contrast-induced nephropathy.   DG Chest Portable 1 View  Result Date: 12/03/2019 CLINICAL DATA:  Chest pain EXAM: PORTABLE CHEST 1 VIEW COMPARISON:  03/06/2011 FINDINGS: Low lung volumes. Heart and mediastinal contours are within normal limits. No focal opacities or effusions. No acute bony abnormality. IMPRESSION: No active disease. Electronically Signed   By: Rolm Baptise M.D.   On: 12/03/2019 20:30   ECHOCARDIOGRAM COMPLETE  Result Date: 12/04/2019    ECHOCARDIOGRAM REPORT   Patient Name:   Casey [name]. Date of Exam: 12/04/2019 Medical Rec #:  [id number]           Height:       73.0 in Accession #:    [id number]          Weight:       203.9 lb Date of Birth:   19-Dec-1960            BSA:          2.169 m Patient Age:    59 years            BP:           92/75 mmHg Patient Gender: M                   HR:           90 bpm. Exam Location:  [place]  Procedure: 2D Echo, Color Doppler, Cardiac Doppler and Intracardiac            Opacification Agent Indications:     Acute myocardial infarction  History:         Patient has no prior history of Echocardiogram examinations.                  CKD; Risk Factors:Hypertension and Diabetes.  Sonographer:     Charmayne Sheer RDCS (AE) Referring Phys:  [place] Diagnosing Phys: Kathlyn Sacramento MD  Sonographer Comments: No subcostal window. IMPRESSIONS  1. Echo contrast was given. There is heavy smoke in the apex with possible early thrombus formation. . Left ventricular ejection fraction, by estimation, is 20 to 25%. The left ventricle has severely decreased function. The left ventricle demonstrates regional wall motion abnormalities (see scoring diagram/findings for description). The left ventricular internal cavity size was mildly dilated. There is mild left ventricular hypertrophy. Left ventricular diastolic parameters are consistent with Grade I  diastolic dysfunction (impaired relaxation). There is akinesis of the left ventricular, mid-apical anteroseptal wall, anterolateral wall and anterior segment. There is dyskinesis of the left ventricular, apical segment.  2. Right ventricular systolic function is normal. The right ventricular size is normal.  3. The mitral valve is normal in structure. No evidence of mitral valve regurgitation. No evidence of mitral stenosis.  4. The aortic valve is normal in structure. Aortic valve regurgitation is not visualized. Mild aortic valve sclerosis is present, with no evidence of aortic valve stenosis.  5. The inferior vena cava is normal in size with greater than 50% respiratory variability, suggesting right atrial pressure of 3 mmHg. FINDINGS  Left Ventricle: Echo contrast was given. There is  heavy smoke in the apex with possible early thrombus formation. Left ventricular ejection fraction, by estimation, is 20 to 25%. The left ventricle has severely decreased function. The left ventricle demonstrates regional wall motion abnormalities. Definity contrast agent was given IV to delineate the left ventricular endocardial borders. The left ventricular internal cavity size was mildly dilated. There is mild left ventricular hypertrophy. Left ventricular diastolic parameters are consistent with Grade I diastolic dysfunction (impaired relaxation). Right Ventricle: The right ventricular size is normal. No increase in right ventricular wall thickness. Right ventricular systolic function is normal. Left Atrium: Left atrial size was normal in size. Right Atrium: Right atrial size was normal in size. Pericardium: There is no evidence of pericardial effusion. Mitral Valve: The mitral valve is normal in structure. There is mild calcification of the mitral valve leaflet(s). Mild mitral annular calcification. No evidence of mitral valve regurgitation. No evidence of mitral valve stenosis. Tricuspid Valve: The tricuspid valve is normal in structure. Tricuspid valve regurgitation is not demonstrated. No evidence of tricuspid stenosis. Aortic Valve: The aortic valve is normal in structure. Aortic valve regurgitation is not visualized. Mild aortic valve sclerosis is present, with no evidence of aortic valve stenosis. Aortic valve mean gradient measures 3.0 mmHg. Aortic valve peak gradient measures 5.9 mmHg. Aortic valve area, by VTI measures 1.97 cm. Pulmonic Valve: The pulmonic valve was normal in structure. Pulmonic valve regurgitation is not visualized. No evidence of pulmonic stenosis. Aorta: The aortic root is normal in size and structure. Venous: The inferior vena cava is normal in size with greater than 50% respiratory variability, suggesting right atrial pressure of 3 mmHg. IAS/Shunts: No atrial level shunt detected  by color flow Doppler.  LEFT VENTRICLE PLAX 2D LVIDd:  4.27 cm  Diastology LVIDs:         3.51 cm  LV e' medial:    3.92 cm/s LV PW:         1.38 cm  LV E/e' medial:  12.9 LV IVS:        0.98 cm  LV e' lateral:   4.68 cm/s LVOT diam:     1.80 cm  LV E/e' lateral: 10.8 LV SV:         31 LV SV Index:   14 LVOT Area:     2.54 cm  RIGHT VENTRICLE RV Basal diam:  2.35 cm LEFT ATRIUM             Index       RIGHT ATRIUM          Index LA diam:        2.80 cm 1.29 cm/m  RA Area:     9.31 cm LA Vol (A2C):   18.6 ml 8.57 ml/m  RA Volume:   15.80 ml 7.28 ml/m LA Vol (A4C):   29.8 ml 13.74 ml/m LA Biplane Vol: 25.0 ml 11.52 ml/m  AORTIC VALVE                   PULMONIC VALVE AV Area (Vmax):    1.72 cm    PV Vmax:       1.00 m/s AV Area (Vmean):   1.76 cm    PV Vmean:      68.800 cm/s AV Area (VTI):     1.97 cm    PV VTI:        0.136 m AV Vmax:           121.00 cm/s PV Peak grad:  4.0 mmHg AV Vmean:          83.200 cm/s PV Mean grad:  2.0 mmHg AV VTI:            0.155 m AV Peak Grad:      5.9 mmHg AV Mean Grad:      3.0 mmHg LVOT Vmax:         81.60 cm/s LVOT Vmean:        57.600 cm/s LVOT VTI:          0.120 m LVOT/AV VTI ratio: 0.77  AORTA Ao Root diam: 2.90 cm MITRAL VALVE MV Area (PHT): 5.20 cm    SHUNTS MV Decel Time: 146 msec    Systemic VTI:  0.12 m MV E velocity: 50.60 cm/s  Systemic Diam: 1.80 cm MV A velocity: 63.80 cm/s MV E/A ratio:  0.79 Kathlyn Sacramento MD Electronically signed by Kathlyn Sacramento MD Signature Date/Time: 12/04/2019/12:50:37 PM    Final     Cardiac Studies   Echocardiogram was done today and was personally reviewed by me.  It showed an EF of 20% with akinesis of mid to distal anterior, anteroseptal and anterolateral myocardium.  There was dyskinesis of the apex with heavy smoke noted with contrast and likely early thrombus formation.  No significant valvular abnormalities and no evidence of pulmonary hypertension.  Patient Profile     59 y.o. male with history of kidney  transplant, type 2 diabetes, essential hypertension and hyperlipidemia who presented yesterday with chest pain of 48-hour duration and was found to have extensive anterior Q waves with persistent ST elevation.  Assessment & Plan    1.  Late presenting anterior ST elevation myocardial infarction: Emergent cardiac catheterization was performed which showed an occluded  mid LAD as well as significant disease affecting the left circumflex and right coronary artery.  I performed successful PCI and drug-eluting stent placement to the mid LAD.  Continue dual antiplatelet therapy with aspirin and ticagrelor.  The RCA is not suitable for revascularization but the left circumflex can be treated with PCI in a staged fashion once we ensure that his renal function is stable.  2.  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  An echo was done today and EF was around 20%.  The patient was not in cardiogenic shock yesterday as his blood pressure was in the 140 range and his LVEDP was 24 mmHg.  Thus, mechanical support was not pursued.  I am concerned about his hemodynamics today as his blood pressure trended down especially after he received a small dose of carvedilol.  I am going to hold carvedilol for now.  He does not appear to be significantly volume overloaded today.  Once blood pressure improves, will introduce small dose carvedilol and losartan. If his hemodynamics worsen, he will need a right heart catheterization followed by mechanical support. The patient will require a wearable defibrillator before hospital discharge.  3.  Status post kidney transplant with acute on chronic kidney disease.  Creatinine gradually increased to 1.53.  Given that his echo does not show significant volume overload, I am going to start gentle hydration at 50 mL/h.  The patient will require staged PCI of the left circumflex.  Nephrology is consulted.  I reviewed the note of Dr. Candiss Norse.  4.   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  I am going to start the patient on unfractionated heparin and ultimately will require transitioning to warfarin once we are done with invasive procedures.  5.  Hyperlipidemia: Continue rosuvastatin.     For questions or updates, please contact [place] Please consult [email or web address] for contact info under        Signed, Kathlyn Sacramento, MD  12/04/2019, 12:55 PM

## 2019-12-04 NOTE — Progress Notes (Signed)
[place] responded to CODE STEMI in the ED; pt. lying in bed awaiting transport to cath lab when [facility] arrived; pt. seemed relatively calm; [place] assured him he is in good hands.  CH met pt.'s wife Denman George in hallway; wife shared that she is feeling very anxious: 'my stomach is in knots!' she said.  She shared that pt. and his brother had been in [place] visiting his mother this weekend and that pt. began experiencing what he thought was indigestion on Friday; this became worse it seems on Saturday and by Sunday pt. decided he needed to see a doctor and his brother drove him back from [place] to [place].  CH helped wife get to cath lab waiting area when pt. taken for catheterization; [facility] and wife prayed for pt.'s procedure and for medical team examining him.  Shortly after this, per AC's permission, pt.'s son Erlene Quan joined wife in waiting room.  Son is a Interior and spatial designer and he and his wife have three daughters; son shared his frustration w/pt. for not heeding his warnings to go to the doctor when not feeling well.  Son remarked that pt.'s diet seems to be free from salty or fatty foods and he wonders why pt. would be having heart trouble. At length, cardiologist updated family post-procedure; procedure successful in placing stent but additional blockages exist which will need to be treated before pt. leaves the hospital.  [facility] escorted family to ICU waiting room and helped them make brief visit per charge RN permission.  CH remains available as needed.

## 2019-12-04 NOTE — Progress Notes (Signed)
*  PRELIMINARY RESULTS* Echocardiogram 2D Echocardiogram has been performed.  Wallie Char Kathyann Spaugh 12/04/2019, 11:18 AM

## 2019-12-05 ENCOUNTER — Encounter (HOSPITAL_COMMUNITY)
Admission: AD | Disposition: A | Discharge status: Home Health | Payer: Self-pay | Source: Other Acute Inpatient Hospital | Attending: Cardiology

## 2019-12-05 ENCOUNTER — Inpatient Hospital Stay (HOSPITAL_COMMUNITY): Payer: BC Managed Care – PPO

## 2019-12-05 ENCOUNTER — Inpatient Hospital Stay (HOSPITAL_COMMUNITY): Payer: BC Managed Care – PPO | Admitting: Certified Registered Nurse Anesthetist

## 2019-12-05 ENCOUNTER — Inpatient Hospital Stay (HOSPITAL_COMMUNITY)
Admission: AD | Admit: 2019-12-05 | Discharge: 2019-12-15 | DRG: 871 | Disposition: A | Discharge status: Home Health | Payer: BC Managed Care – PPO | Source: Other Acute Inpatient Hospital | Attending: Cardiology | Admitting: Cardiology

## 2019-12-05 ENCOUNTER — Inpatient Hospital Stay: Payer: BC Managed Care – PPO

## 2019-12-05 DIAGNOSIS — Q245 Malformation of coronary vessels: Secondary | ICD-10-CM

## 2019-12-05 DIAGNOSIS — Z7902 Long term (current) use of antithrombotics/antiplatelets: Secondary | ICD-10-CM

## 2019-12-05 DIAGNOSIS — E785 Hyperlipidemia, unspecified: Secondary | ICD-10-CM | POA: Diagnosis present

## 2019-12-05 DIAGNOSIS — G9341 Metabolic encephalopathy: Secondary | ICD-10-CM | POA: Diagnosis not present

## 2019-12-05 DIAGNOSIS — I480 Paroxysmal atrial fibrillation: Secondary | ICD-10-CM | POA: Diagnosis present

## 2019-12-05 DIAGNOSIS — A419 Sepsis, unspecified organism: Secondary | ICD-10-CM | POA: Diagnosis present

## 2019-12-05 DIAGNOSIS — E1122 Type 2 diabetes mellitus with diabetic chronic kidney disease: Secondary | ICD-10-CM | POA: Diagnosis present

## 2019-12-05 DIAGNOSIS — N170 Acute kidney failure with tubular necrosis: Secondary | ICD-10-CM | POA: Diagnosis not present

## 2019-12-05 DIAGNOSIS — Z940 Kidney transplant status: Secondary | ICD-10-CM | POA: Diagnosis not present

## 2019-12-05 DIAGNOSIS — J9601 Acute respiratory failure with hypoxia: Secondary | ICD-10-CM | POA: Diagnosis not present

## 2019-12-05 DIAGNOSIS — N189 Chronic kidney disease, unspecified: Secondary | ICD-10-CM | POA: Diagnosis present

## 2019-12-05 DIAGNOSIS — K7200 Acute and subacute hepatic failure without coma: Secondary | ICD-10-CM | POA: Diagnosis present

## 2019-12-05 DIAGNOSIS — I255 Ischemic cardiomyopathy: Secondary | ICD-10-CM | POA: Diagnosis present

## 2019-12-05 DIAGNOSIS — R570 Cardiogenic shock: Secondary | ICD-10-CM | POA: Diagnosis present

## 2019-12-05 DIAGNOSIS — N183 Chronic kidney disease, stage 3 unspecified: Secondary | ICD-10-CM | POA: Diagnosis present

## 2019-12-05 DIAGNOSIS — K567 Ileus, unspecified: Secondary | ICD-10-CM | POA: Diagnosis present

## 2019-12-05 DIAGNOSIS — I130 Hypertensive heart and chronic kidney disease with heart failure and stage 1 through stage 4 chronic kidney disease, or unspecified chronic kidney disease: Secondary | ICD-10-CM | POA: Diagnosis present

## 2019-12-05 DIAGNOSIS — E663 Overweight: Secondary | ICD-10-CM | POA: Diagnosis present

## 2019-12-05 DIAGNOSIS — M79675 Pain in left toe(s): Secondary | ICD-10-CM | POA: Diagnosis not present

## 2019-12-05 DIAGNOSIS — R6521 Severe sepsis with septic shock: Secondary | ICD-10-CM | POA: Diagnosis present

## 2019-12-05 DIAGNOSIS — E1165 Type 2 diabetes mellitus with hyperglycemia: Secondary | ICD-10-CM | POA: Diagnosis present

## 2019-12-05 DIAGNOSIS — I252 Old myocardial infarction: Secondary | ICD-10-CM

## 2019-12-05 DIAGNOSIS — N179 Acute kidney failure, unspecified: Secondary | ICD-10-CM | POA: Diagnosis not present

## 2019-12-05 DIAGNOSIS — D631 Anemia in chronic kidney disease: Secondary | ICD-10-CM | POA: Diagnosis present

## 2019-12-05 DIAGNOSIS — Z794 Long term (current) use of insulin: Secondary | ICD-10-CM

## 2019-12-05 DIAGNOSIS — I2510 Atherosclerotic heart disease of native coronary artery without angina pectoris: Secondary | ICD-10-CM | POA: Diagnosis present

## 2019-12-05 DIAGNOSIS — I2109 ST elevation (STEMI) myocardial infarction involving other coronary artery of anterior wall: Secondary | ICD-10-CM | POA: Diagnosis present

## 2019-12-05 DIAGNOSIS — Z5309 Procedure and treatment not carried out because of other contraindication: Secondary | ICD-10-CM | POA: Diagnosis not present

## 2019-12-05 DIAGNOSIS — Z83438 Family history of other disorder of lipoprotein metabolism and other lipidemia: Secondary | ICD-10-CM

## 2019-12-05 DIAGNOSIS — I5021 Acute systolic (congestive) heart failure: Secondary | ICD-10-CM

## 2019-12-05 DIAGNOSIS — I2102 ST elevation (STEMI) myocardial infarction involving left anterior descending coronary artery: Secondary | ICD-10-CM

## 2019-12-05 DIAGNOSIS — Z7952 Long term (current) use of systemic steroids: Secondary | ICD-10-CM

## 2019-12-05 DIAGNOSIS — E871 Hypo-osmolality and hyponatremia: Secondary | ICD-10-CM | POA: Diagnosis present

## 2019-12-05 DIAGNOSIS — I213 ST elevation (STEMI) myocardial infarction of unspecified site: Secondary | ICD-10-CM | POA: Diagnosis not present

## 2019-12-05 DIAGNOSIS — Z955 Presence of coronary angioplasty implant and graft: Secondary | ICD-10-CM

## 2019-12-05 DIAGNOSIS — I10 Essential (primary) hypertension: Secondary | ICD-10-CM | POA: Diagnosis present

## 2019-12-05 DIAGNOSIS — Z833 Family history of diabetes mellitus: Secondary | ICD-10-CM

## 2019-12-05 DIAGNOSIS — Z6827 Body mass index (BMI) 27.0-27.9, adult: Secondary | ICD-10-CM

## 2019-12-05 DIAGNOSIS — I513 Intracardiac thrombosis, not elsewhere classified: Secondary | ICD-10-CM | POA: Diagnosis present

## 2019-12-05 DIAGNOSIS — K219 Gastro-esophageal reflux disease without esophagitis: Secondary | ICD-10-CM | POA: Diagnosis present

## 2019-12-05 DIAGNOSIS — I5023 Acute on chronic systolic (congestive) heart failure: Secondary | ICD-10-CM | POA: Diagnosis present

## 2019-12-05 DIAGNOSIS — Z452 Encounter for adjustment and management of vascular access device: Secondary | ICD-10-CM

## 2019-12-05 DIAGNOSIS — H5702 Anisocoria: Secondary | ICD-10-CM | POA: Diagnosis not present

## 2019-12-05 DIAGNOSIS — E1169 Type 2 diabetes mellitus with other specified complication: Secondary | ICD-10-CM | POA: Diagnosis present

## 2019-12-05 DIAGNOSIS — Z7982 Long term (current) use of aspirin: Secondary | ICD-10-CM

## 2019-12-05 DIAGNOSIS — E876 Hypokalemia: Secondary | ICD-10-CM | POA: Diagnosis not present

## 2019-12-05 DIAGNOSIS — Z4659 Encounter for fitting and adjustment of other gastrointestinal appliance and device: Secondary | ICD-10-CM

## 2019-12-05 HISTORY — DX: Anisocoria: H57.02

## 2019-12-05 HISTORY — PX: RIGHT HEART CATH: CATH118263

## 2019-12-05 LAB — POCT I-STAT EG7
Acid-base deficit: 3 mmol/L — ABNORMAL HIGH (ref 0.0–2.0)
Acid-base deficit: 4 mmol/L — ABNORMAL HIGH (ref 0.0–2.0)
Bicarbonate: 20 mmol/L (ref 20.0–28.0)
Bicarbonate: 21.1 mmol/L (ref 20.0–28.0)
Calcium, Ion: 1.23 mmol/L (ref 1.15–1.40)
Calcium, Ion: 1.31 mmol/L (ref 1.15–1.40)
HCT: 41 % (ref 39.0–52.0)
HCT: 42 % (ref 39.0–52.0)
Hemoglobin: 13.9 g/dL (ref 13.0–17.0)
Hemoglobin: 14.3 g/dL (ref 13.0–17.0)
O2 Saturation: 65 %
O2 Saturation: 65 %
Potassium: 4.1 mmol/L (ref 3.5–5.1)
Potassium: 4.3 mmol/L (ref 3.5–5.1)
Sodium: 133 mmol/L — ABNORMAL LOW (ref 135–145)
Sodium: 135 mmol/L (ref 135–145)
TCO2: 21 mmol/L — ABNORMAL LOW (ref 22–32)
TCO2: 22 mmol/L (ref 22–32)
pCO2, Ven: 33.7 mmHg — ABNORMAL LOW (ref 44.0–60.0)
pCO2, Ven: 34.8 mmHg — ABNORMAL LOW (ref 44.0–60.0)
pH, Ven: 7.382 (ref 7.250–7.430)
pH, Ven: 7.391 (ref 7.250–7.430)
pO2, Ven: 34 mmHg (ref 32.0–45.0)
pO2, Ven: 34 mmHg (ref 32.0–45.0)

## 2019-12-05 LAB — CBC
HCT: 43.1 % (ref 39.0–52.0)
Hemoglobin: 14.4 g/dL (ref 13.0–17.0)
MCH: 27.7 pg (ref 26.0–34.0)
MCHC: 33.4 g/dL (ref 30.0–36.0)
MCV: 83 fL (ref 80.0–100.0)
Platelets: 316 10*3/uL (ref 150–400)
RBC: 5.19 MIL/uL (ref 4.22–5.81)
RDW: 14.6 % (ref 11.5–15.5)
WBC: 24 10*3/uL — ABNORMAL HIGH (ref 4.0–10.5)
nRBC: 0 % (ref 0.0–0.2)

## 2019-12-05 LAB — POCT I-STAT 7, (LYTES, BLD GAS, ICA,H+H)
Acid-base deficit: 5 mmol/L — ABNORMAL HIGH (ref 0.0–2.0)
Bicarbonate: 20.2 mmol/L (ref 20.0–28.0)
Calcium, Ion: 1.28 mmol/L (ref 1.15–1.40)
HCT: 43 % (ref 39.0–52.0)
Hemoglobin: 14.6 g/dL (ref 13.0–17.0)
O2 Saturation: 99 %
Potassium: 4.3 mmol/L (ref 3.5–5.1)
Sodium: 135 mmol/L (ref 135–145)
TCO2: 21 mmol/L — ABNORMAL LOW (ref 22–32)
pCO2 arterial: 36.6 mmHg (ref 32.0–48.0)
pH, Arterial: 7.351 (ref 7.350–7.450)
pO2, Arterial: 142 mmHg — ABNORMAL HIGH (ref 83.0–108.0)

## 2019-12-05 LAB — COOXEMETRY PANEL
Carboxyhemoglobin: 0.6 % (ref 0.5–1.5)
Methemoglobin: 1 % (ref 0.0–1.5)
O2 Saturation: 75.1 %
Total hemoglobin: 13 g/dL (ref 12.0–16.0)

## 2019-12-05 LAB — LACTIC ACID, PLASMA
Lactic Acid, Venous: 1.3 mmol/L (ref 0.5–1.9)
Lactic Acid, Venous: 1.4 mmol/L (ref 0.5–1.9)

## 2019-12-05 LAB — COMPREHENSIVE METABOLIC PANEL
ALT: 45 U/L — ABNORMAL HIGH (ref 0–44)
AST: 79 U/L — ABNORMAL HIGH (ref 15–41)
Albumin: 3.2 g/dL — ABNORMAL LOW (ref 3.5–5.0)
Alkaline Phosphatase: 78 U/L (ref 38–126)
Anion gap: 14 (ref 5–15)
BUN: 53 mg/dL — ABNORMAL HIGH (ref 6–20)
CO2: 19 mmol/L — ABNORMAL LOW (ref 22–32)
Calcium: 9.8 mg/dL (ref 8.9–10.3)
Chloride: 99 mmol/L (ref 98–111)
Creatinine, Ser: 3.1 mg/dL — ABNORMAL HIGH (ref 0.61–1.24)
GFR, Estimated: 21 mL/min — ABNORMAL LOW (ref >60)
Glucose, Bld: 244 mg/dL — ABNORMAL HIGH (ref 70–99)
Potassium: 4.6 mmol/L (ref 3.5–5.1)
Sodium: 132 mmol/L — ABNORMAL LOW (ref 135–145)
Total Bilirubin: 1.2 mg/dL (ref 0.3–1.2)
Total Protein: 7.1 g/dL (ref 6.5–8.1)

## 2019-12-05 LAB — GLUCOSE, CAPILLARY
Glucose-Capillary: 201 mg/dL — ABNORMAL HIGH (ref 70–99)
Glucose-Capillary: 235 mg/dL — ABNORMAL HIGH (ref 70–99)
Glucose-Capillary: 285 mg/dL — ABNORMAL HIGH (ref 70–99)
Glucose-Capillary: 295 mg/dL — ABNORMAL HIGH (ref 70–99)
Glucose-Capillary: 298 mg/dL — ABNORMAL HIGH (ref 70–99)

## 2019-12-05 LAB — HEPARIN LEVEL (UNFRACTIONATED): Heparin Unfractionated: 0.42 IU/mL (ref 0.30–0.70)

## 2019-12-05 LAB — PROTIME-INR
INR: 1.3 — ABNORMAL HIGH (ref 0.8–1.2)
Prothrombin Time: 15.3 seconds — ABNORMAL HIGH (ref 11.4–15.2)

## 2019-12-05 LAB — TROPONIN I (HIGH SENSITIVITY)
Troponin I (High Sensitivity): >27000 ng/L (ref <18)
Troponin I (High Sensitivity): >27000 ng/L (ref <18)

## 2019-12-05 LAB — PREPARE RBC (CROSSMATCH)

## 2019-12-05 SURGERY — RIGHT HEART CATH
Anesthesia: LOCAL

## 2019-12-05 SURGERY — ECHOCARDIOGRAM, TRANSESOPHAGEAL
Anesthesia: General

## 2019-12-05 MED ORDER — PHENYLEPHRINE HCL-NACL 20-0.9 MG/250ML-% IV SOLN
30.0000 ug/min | INTRAVENOUS | Status: DC
  Filled 2019-12-05: qty 250

## 2019-12-05 MED ORDER — TRANEXAMIC ACID 1000 MG/10ML IV SOLN
1.5000 mg/kg/h | INTRAVENOUS | Status: DC
  Filled 2019-12-05: qty 25

## 2019-12-05 MED ORDER — SODIUM CHLORIDE 0.9 % IV SOLN
INTRAVENOUS | Status: DC | PRN
  Administered 2019-12-05: 500 mL

## 2019-12-05 MED ORDER — PROPOFOL 10 MG/ML IV BOLUS
INTRAVENOUS | Status: AC
  Filled 2019-12-05: qty 20

## 2019-12-05 MED ORDER — POLYETHYLENE GLYCOL 3350 17 G PO PACK: 17.0000 g | PACK | Freq: Every day | ORAL | Status: DC

## 2019-12-05 MED ORDER — MIDAZOLAM HCL 2 MG/2ML IJ SOLN
INTRAMUSCULAR | Status: DC | PRN
  Administered 2019-12-05: 2 mg via INTRAVENOUS

## 2019-12-05 MED ORDER — MILRINONE LACTATE IN DEXTROSE 20-5 MG/100ML-% IV SOLN: 0.2500 ug/kg/min | INTRAVENOUS | Status: DC

## 2019-12-05 MED ORDER — FENTANYL CITRATE (PF) 100 MCG/2ML IJ SOLN
INTRAMUSCULAR | Status: AC
  Filled 2019-12-05: qty 2

## 2019-12-05 MED ORDER — TICAGRELOR 90 MG PO TABS: 90.0000 mg | ORAL_TABLET | Freq: Two times a day (BID) | ORAL | Status: DC

## 2019-12-05 MED ORDER — AMIODARONE LOAD VIA INFUSION
150.0000 mg | Freq: Once | INTRAVENOUS | Status: AC
  Administered 2019-12-05: 150 mg via INTRAVENOUS
  Filled 2019-12-05: qty 83.34

## 2019-12-05 MED ORDER — LIDOCAINE 2% (20 MG/ML) 5 ML SYRINGE
INTRAMUSCULAR | Status: DC | PRN
  Administered 2019-12-05: 80 mg via INTRAVENOUS

## 2019-12-05 MED ORDER — TACROLIMUS 1 MG PO CAPS
3.0000 mg | ORAL_CAPSULE | Freq: Two times a day (BID) | ORAL | Status: DC
  Filled 2019-12-05: qty 3

## 2019-12-05 MED ORDER — PREDNISONE 10 MG PO TABS: 5.0000 mg | ORAL_TABLET | Freq: Every day | ORAL | Status: DC

## 2019-12-05 MED ORDER — MIDAZOLAM HCL 2 MG/2ML IJ SOLN
INTRAMUSCULAR | Status: AC
  Filled 2019-12-05: qty 2

## 2019-12-05 MED ORDER — HEPARIN SODIUM (PORCINE) 5000 UNIT/ML IJ SOLN
50000.0000 [IU] | INTRAVENOUS | Status: DC
  Filled 2019-12-05: qty 10

## 2019-12-05 MED ORDER — LACTATED RINGERS IV SOLN: INTRAVENOUS | Status: DC | PRN

## 2019-12-05 MED ORDER — MILRINONE LACTATE IN DEXTROSE 20-5 MG/100ML-% IV SOLN
0.1250 ug/kg/min | INTRAVENOUS | Status: DC
  Administered 2019-12-06 – 2019-12-13 (×14): 0.25 ug/kg/min via INTRAVENOUS
  Administered 2019-12-14: 0.125 ug/kg/min via INTRAVENOUS
  Filled 2019-12-05 (×16): qty 100

## 2019-12-05 MED ORDER — TACROLIMUS 1 MG PO CAPS
1.5000 mg | ORAL_CAPSULE | Freq: Two times a day (BID) | ORAL | Status: DC
  Filled 2019-12-05: qty 1

## 2019-12-05 MED ORDER — MIDAZOLAM BOLUS VIA INFUSION
1.0000 mg | INTRAVENOUS | Status: DC | PRN
  Filled 2019-12-05: qty 2

## 2019-12-05 MED ORDER — HEPARIN (PORCINE) IN NACL 1000-0.9 UT/500ML-% IV SOLN
INTRAVENOUS | Status: DC | PRN
  Administered 2019-12-05 (×2): 500 mL

## 2019-12-05 MED ORDER — FENTANYL BOLUS VIA INFUSION
50.0000 ug | INTRAVENOUS | Status: DC | PRN
  Filled 2019-12-05: qty 50

## 2019-12-05 MED ORDER — SODIUM CHLORIDE 0.9 % IV SOLN
INTRAVENOUS | Status: AC
  Filled 2019-12-05: qty 1.2

## 2019-12-05 MED ORDER — SODIUM CHLORIDE 0.9 % IV SOLN
750.0000 mg | INTRAVENOUS | Status: DC
  Filled 2019-12-05: qty 750

## 2019-12-05 MED ORDER — PHENYLEPHRINE HCL-NACL 20-0.9 MG/250ML-% IV SOLN
0.0000 ug/min | INTRAVENOUS | Status: DC
  Filled 2019-12-05 (×2): qty 250

## 2019-12-05 MED ORDER — HEPARIN (PORCINE) 25000 UT/250ML-% IV SOLN: 1400.0000 [IU]/h | INTRAVENOUS | Status: DC

## 2019-12-05 MED ORDER — SODIUM CHLORIDE 0.9 % IV SOLN: INTRAVENOUS | Status: DC

## 2019-12-05 MED ORDER — TACROLIMUS 1 MG/ML ORAL SUSPENSION
3.0000 mg | Freq: Two times a day (BID) | ORAL | Status: DC
  Administered 2019-12-05 – 2019-12-09 (×9): 3 mg
  Filled 2019-12-05 (×11): qty 3

## 2019-12-05 MED ORDER — NOREPINEPHRINE 16 MG/250ML-% IV SOLN
0.0000 ug/min | INTRAVENOUS | Status: DC
  Administered 2019-12-05: 60 ug/min via INTRAVENOUS
  Administered 2019-12-05: 50 ug/min via INTRAVENOUS
  Administered 2019-12-06: 40 ug/min via INTRAVENOUS
  Filled 2019-12-05 (×2): qty 250

## 2019-12-05 MED ORDER — TRANEXAMIC ACID (OHS) PUMP PRIME SOLUTION
2.0000 mg/kg | INTRAVENOUS | Status: DC
  Filled 2019-12-05: qty 1.89

## 2019-12-05 MED ORDER — ASPIRIN 81 MG PO CHEW: 81.0000 mg | CHEWABLE_TABLET | Freq: Every day | ORAL | Status: DC

## 2019-12-05 MED ORDER — INSULIN ASPART 100 UNIT/ML ~~LOC~~ SOLN: 0.0000 [IU] | Freq: Three times a day (TID) | SUBCUTANEOUS | Status: DC

## 2019-12-05 MED ORDER — VASOPRESSIN 20 UNITS/100 ML INFUSION FOR SHOCK
0.0300 [IU]/min | INTRAVENOUS | Status: DC
  Administered 2019-12-05 – 2019-12-06 (×4): 0.04 [IU]/min via INTRAVENOUS
  Filled 2019-12-05 (×4): qty 100

## 2019-12-05 MED ORDER — SODIUM CHLORIDE 0.9 % IV SOLN: INTRAVENOUS | Status: DC | PRN

## 2019-12-05 MED ORDER — TRANEXAMIC ACID (OHS) BOLUS VIA INFUSION
15.0000 mg/kg | INTRAVENOUS | Status: DC
  Filled 2019-12-05: qty 1418

## 2019-12-05 MED ORDER — 0.9 % SODIUM CHLORIDE (POUR BTL) OPTIME
TOPICAL | Status: DC | PRN
  Administered 2019-12-05: 2000 mL

## 2019-12-05 MED ORDER — TICAGRELOR 90 MG PO TABS
90.0000 mg | ORAL_TABLET | Freq: Two times a day (BID) | ORAL | Status: DC
  Administered 2019-12-05 – 2019-12-08 (×7): 90 mg
  Filled 2019-12-05 (×7): qty 1

## 2019-12-05 MED ORDER — FENTANYL 2500MCG IN NS 250ML (10MCG/ML) PREMIX INFUSION: 50.0000 ug/h | INTRAVENOUS | Status: DC

## 2019-12-05 MED ORDER — NOREPINEPHRINE 4 MG/250ML-% IV SOLN
0.0000 ug/min | INTRAVENOUS | Status: AC
  Administered 2019-12-05: 4 ug/min via INTRAVENOUS
  Filled 2019-12-05: qty 250

## 2019-12-05 MED ORDER — MIDAZOLAM HCL 2 MG/2ML IJ SOLN
2.0000 mg | INTRAMUSCULAR | Status: DC | PRN
  Administered 2019-12-05: 2 mg via INTRAVENOUS

## 2019-12-05 MED ORDER — DEXMEDETOMIDINE HCL IN NACL 400 MCG/100ML IV SOLN
0.1000 ug/kg/h | INTRAVENOUS | Status: AC
  Administered 2019-12-05: .5 ug/kg/h via INTRAVENOUS
  Filled 2019-12-05: qty 100

## 2019-12-05 MED ORDER — ROSUVASTATIN CALCIUM 20 MG PO TABS: 40.0000 mg | ORAL_TABLET | Freq: Every day | ORAL | Status: DC

## 2019-12-05 MED ORDER — EPINEPHRINE HCL 5 MG/250ML IV SOLN IN NS
0.5000 ug/min | INTRAVENOUS | Status: DC
  Filled 2019-12-05: qty 250

## 2019-12-05 MED ORDER — NOREPINEPHRINE 4 MG/250ML-% IV SOLN: 2.0000 ug/min | INTRAVENOUS | Status: DC

## 2019-12-05 MED ORDER — PHENYLEPHRINE HCL-NACL 10-0.9 MG/250ML-% IV SOLN
INTRAVENOUS | Status: DC | PRN
  Administered 2019-12-05: 75 ug/min via INTRAVENOUS
  Administered 2019-12-05: 25 ug/min via INTRAVENOUS

## 2019-12-05 MED ORDER — FENTANYL CITRATE (PF) 250 MCG/5ML IJ SOLN
INTRAMUSCULAR | Status: AC
  Filled 2019-12-05: qty 5

## 2019-12-05 MED ORDER — FENTANYL CITRATE (PF) 100 MCG/2ML IJ SOLN
INTRAMUSCULAR | Status: AC
  Administered 2019-12-05: 100 ug
  Filled 2019-12-05: qty 4

## 2019-12-05 MED ORDER — DOCUSATE SODIUM 50 MG/5ML PO LIQD
100.0000 mg | Freq: Two times a day (BID) | ORAL | Status: DC
  Administered 2019-12-05: 100 mg
  Filled 2019-12-05: qty 10

## 2019-12-05 MED ORDER — LIDOCAINE HCL (PF) 1 % IJ SOLN
INTRAMUSCULAR | Status: AC
  Filled 2019-12-05: qty 30

## 2019-12-05 MED ORDER — SODIUM CHLORIDE 0.9 % IV SOLN: 250.0000 mL | INTRAVENOUS | Status: DC

## 2019-12-05 MED ORDER — AMIODARONE HCL IN DEXTROSE 360-4.14 MG/200ML-% IV SOLN
60.0000 mg/h | INTRAVENOUS | Status: AC
  Administered 2019-12-05 (×2): 60 mg/h via INTRAVENOUS
  Filled 2019-12-05 (×2): qty 200

## 2019-12-05 MED ORDER — FENTANYL 2500MCG IN NS 250ML (10MCG/ML) PREMIX INFUSION
0.0000 ug/h | INTRAVENOUS | Status: DC
  Administered 2019-12-05: 100 ug/h via INTRAVENOUS
  Filled 2019-12-05: qty 250

## 2019-12-05 MED ORDER — PLASMA-LYTE 148 IV SOLN
INTRAVENOUS | Status: DC
  Filled 2019-12-05: qty 2.5

## 2019-12-05 MED ORDER — SUGAMMADEX SODIUM 200 MG/2ML IV SOLN
INTRAVENOUS | Status: DC | PRN
  Administered 2019-12-05: 400 mg via INTRAVENOUS

## 2019-12-05 MED ORDER — PANTOPRAZOLE SODIUM 40 MG IV SOLR
40.0000 mg | INTRAVENOUS | Status: DC
  Administered 2019-12-05 – 2019-12-10 (×6): 40 mg via INTRAVENOUS
  Filled 2019-12-05 (×6): qty 40

## 2019-12-05 MED ORDER — SODIUM CHLORIDE 0.9 % IV SOLN
1.5000 g | INTRAVENOUS | Status: AC
  Administered 2019-12-05: 1.5 g via INTRAVENOUS
  Filled 2019-12-05: qty 1.5

## 2019-12-05 MED ORDER — SODIUM CHLORIDE 0.9% FLUSH: 3.0000 mL | INTRAVENOUS | Status: DC | PRN

## 2019-12-05 MED ORDER — FENTANYL CITRATE (PF) 100 MCG/2ML IJ SOLN: 50.0000 ug | Freq: Once | INTRAMUSCULAR | Status: DC

## 2019-12-05 MED ORDER — MIDAZOLAM 50MG/50ML (1MG/ML) PREMIX INFUSION
0.0000 mg/h | INTRAVENOUS | Status: DC
  Filled 2019-12-05: qty 50

## 2019-12-05 MED ORDER — INSULIN ASPART 100 UNIT/ML ~~LOC~~ SOLN: 0.0000 [IU] | Freq: Every day | SUBCUTANEOUS | Status: DC

## 2019-12-05 MED ORDER — INSULIN GLARGINE 100 UNIT/ML ~~LOC~~ SOLN: 10.0000 [IU] | Freq: Every day | SUBCUTANEOUS | Status: DC

## 2019-12-05 MED ORDER — ETOMIDATE 2 MG/ML IV SOLN
INTRAVENOUS | Status: DC | PRN
  Administered 2019-12-05: 10 mg via INTRAVENOUS

## 2019-12-05 MED ORDER — ALBUMIN HUMAN 5 % IV SOLN: 12.5000 g | Freq: Once | INTRAVENOUS | Status: DC

## 2019-12-05 MED ORDER — MILRINONE LACTATE IN DEXTROSE 20-5 MG/100ML-% IV SOLN
0.3000 ug/kg/min | INTRAVENOUS | Status: AC
  Administered 2019-12-05: .25 ug/kg/min via INTRAVENOUS
  Filled 2019-12-05: qty 100

## 2019-12-05 MED ORDER — LIDOCAINE HCL (PF) 1 % IJ SOLN
INTRAMUSCULAR | Status: DC | PRN
  Administered 2019-12-05: 15 mL

## 2019-12-05 MED ORDER — INSULIN GLARGINE 100 UNIT/ML ~~LOC~~ SOLN
10.0000 [IU] | Freq: Every day | SUBCUTANEOUS | Status: DC
  Administered 2019-12-05: 10 [IU] via SUBCUTANEOUS
  Filled 2019-12-05: qty 0.1

## 2019-12-05 MED ORDER — PREDNISONE 10 MG PO TABS
5.0000 mg | ORAL_TABLET | Freq: Every day | ORAL | Status: DC
  Administered 2019-12-06 – 2019-12-08 (×3): 5 mg
  Filled 2019-12-05 (×3): qty 1

## 2019-12-05 MED ORDER — ASPIRIN 81 MG PO CHEW
81.0000 mg | CHEWABLE_TABLET | Freq: Every day | ORAL | Status: DC
  Administered 2019-12-06 – 2019-12-08 (×3): 81 mg
  Filled 2019-12-05 (×3): qty 1

## 2019-12-05 MED ORDER — MIDAZOLAM HCL 2 MG/2ML IJ SOLN
INTRAMUSCULAR | Status: DC | PRN
  Administered 2019-12-05: 0.5 mg via INTRAVENOUS

## 2019-12-05 MED ORDER — POTASSIUM CHLORIDE 2 MEQ/ML IV SOLN
80.0000 meq | INTRAVENOUS | Status: DC
  Filled 2019-12-05: qty 40

## 2019-12-05 MED ORDER — PHENYLEPHRINE HCL (PRESSORS) 10 MG/ML IV SOLN
INTRAVENOUS | Status: DC | PRN
  Administered 2019-12-05: 120 ug via INTRAVENOUS
  Administered 2019-12-05: 200 ug via INTRAVENOUS

## 2019-12-05 MED ORDER — FENTANYL CITRATE (PF) 100 MCG/2ML IJ SOLN
INTRAMUSCULAR | Status: DC | PRN
Start: 2019-12-05 — End: 2019-12-05
  Administered 2019-12-05: 12.5 ug via INTRAVENOUS

## 2019-12-05 MED ORDER — ROCURONIUM BROMIDE 10 MG/ML (PF) SYRINGE
PREFILLED_SYRINGE | INTRAVENOUS | Status: DC | PRN
  Administered 2019-12-05: 60 mg via INTRAVENOUS
  Administered 2019-12-05: 40 mg via INTRAVENOUS

## 2019-12-05 MED ORDER — HEPARIN (PORCINE) 25000 UT/250ML-% IV SOLN
200.0000 [IU]/h | INTRAVENOUS | Status: DC
  Filled 2019-12-05: qty 250

## 2019-12-05 MED ORDER — VANCOMYCIN HCL 1500 MG/300ML IV SOLN
1500.0000 mg | INTRAVENOUS | Status: DC
  Filled 2019-12-05: qty 300

## 2019-12-05 MED ORDER — AMIODARONE HCL IN DEXTROSE 360-4.14 MG/200ML-% IV SOLN
30.0000 mg/h | INTRAVENOUS | Status: DC
  Administered 2019-12-06 – 2019-12-14 (×18): 30 mg/h via INTRAVENOUS
  Filled 2019-12-05 (×17): qty 200

## 2019-12-05 MED ORDER — EPINEPHRINE HCL 5 MG/250ML IV SOLN IN NS
0.0000 ug/min | INTRAVENOUS | Status: DC
  Filled 2019-12-05: qty 250

## 2019-12-05 MED ORDER — SODIUM CHLORIDE 0.9 % IV SOLN: 250.0000 mL | INTRAVENOUS | Status: DC | PRN

## 2019-12-05 MED ORDER — MAGNESIUM SULFATE 50 % IJ SOLN
40.0000 meq | INTRAMUSCULAR | Status: DC
  Filled 2019-12-05: qty 9.85

## 2019-12-05 MED ORDER — PIPERACILLIN-TAZOBACTAM 3.375 G IVPB
3.3750 g | Freq: Once | INTRAVENOUS | Status: DC
  Filled 2019-12-05: qty 50

## 2019-12-05 MED ORDER — ROSUVASTATIN CALCIUM 20 MG PO TABS
40.0000 mg | ORAL_TABLET | Freq: Every day | ORAL | Status: DC
  Administered 2019-12-06 – 2019-12-08 (×3): 40 mg
  Filled 2019-12-05 (×3): qty 2

## 2019-12-05 MED ORDER — SODIUM CHLORIDE 0.9 % IV SOLN
INTRAVENOUS | Status: DC
  Filled 2019-12-05: qty 30

## 2019-12-05 MED ORDER — FENTANYL CITRATE (PF) 250 MCG/5ML IJ SOLN
INTRAMUSCULAR | Status: DC | PRN
  Administered 2019-12-05: 100 ug via INTRAVENOUS
  Administered 2019-12-05: 150 ug via INTRAVENOUS

## 2019-12-05 MED ORDER — PIPERACILLIN-TAZOBACTAM 3.375 G IVPB
3.3750 g | Freq: Three times a day (TID) | INTRAVENOUS | Status: DC
  Administered 2019-12-05 – 2019-12-09 (×11): 3.375 g via INTRAVENOUS
  Filled 2019-12-05 (×11): qty 50

## 2019-12-05 MED ORDER — ASPIRIN EC 81 MG PO TBEC: 81.0000 mg | DELAYED_RELEASE_TABLET | Freq: Every day | ORAL | Status: DC

## 2019-12-05 MED ORDER — INSULIN ASPART 100 UNIT/ML ~~LOC~~ SOLN
0.0000 [IU] | SUBCUTANEOUS | 11 refills | Status: DC
Start: 2019-12-05 — End: 2019-12-15

## 2019-12-05 MED ORDER — SODIUM CHLORIDE 0.9% FLUSH
3.0000 mL | Freq: Two times a day (BID) | INTRAVENOUS | Status: DC
  Administered 2019-12-06 – 2019-12-15 (×19): 3 mL via INTRAVENOUS

## 2019-12-05 MED ORDER — TACROLIMUS 1 MG PO CAPS
3.0000 mg | ORAL_CAPSULE | Freq: Two times a day (BID) | ORAL | Status: DC
  Administered 2019-12-05: 3 mg via ORAL

## 2019-12-05 MED ORDER — INSULIN ASPART 100 UNIT/ML ~~LOC~~ SOLN
0.0000 [IU] | SUBCUTANEOUS | Status: DC
  Administered 2019-12-05 – 2019-12-06 (×2): 5 [IU] via SUBCUTANEOUS
  Administered 2019-12-06: 7 [IU] via SUBCUTANEOUS
  Administered 2019-12-06: 5 [IU] via SUBCUTANEOUS
  Administered 2019-12-06: 9 [IU] via SUBCUTANEOUS
  Administered 2019-12-06: 3 [IU] via SUBCUTANEOUS
  Administered 2019-12-06 – 2019-12-07 (×6): 2 [IU] via SUBCUTANEOUS
  Administered 2019-12-07 – 2019-12-08 (×3): 3 [IU] via SUBCUTANEOUS
  Administered 2019-12-08: 2 [IU] via SUBCUTANEOUS
  Administered 2019-12-08: 3 [IU] via SUBCUTANEOUS
  Administered 2019-12-08 (×2): 2 [IU] via SUBCUTANEOUS
  Administered 2019-12-09: 5 [IU] via SUBCUTANEOUS
  Administered 2019-12-09: 2 [IU] via SUBCUTANEOUS
  Administered 2019-12-09: 3 [IU] via SUBCUTANEOUS
  Administered 2019-12-09 (×3): 2 [IU] via SUBCUTANEOUS
  Administered 2019-12-10: 1 [IU] via SUBCUTANEOUS
  Administered 2019-12-10 – 2019-12-11 (×5): 3 [IU] via SUBCUTANEOUS
  Administered 2019-12-11: 5 [IU] via SUBCUTANEOUS
  Administered 2019-12-11: 3 [IU] via SUBCUTANEOUS
  Administered 2019-12-11 (×3): 2 [IU] via SUBCUTANEOUS
  Administered 2019-12-12: 5 [IU] via SUBCUTANEOUS
  Administered 2019-12-12 (×2): 3 [IU] via SUBCUTANEOUS
  Administered 2019-12-12: 2 [IU] via SUBCUTANEOUS
  Administered 2019-12-12 – 2019-12-13 (×3): 5 [IU] via SUBCUTANEOUS
  Administered 2019-12-13 (×2): 2 [IU] via SUBCUTANEOUS
  Administered 2019-12-13: 3 [IU] via SUBCUTANEOUS
  Administered 2019-12-13: 2 [IU] via SUBCUTANEOUS
  Administered 2019-12-14: 1 [IU] via SUBCUTANEOUS
  Administered 2019-12-14: 3 [IU] via SUBCUTANEOUS
  Administered 2019-12-14: 2 [IU] via SUBCUTANEOUS
  Administered 2019-12-14 (×2): 5 [IU] via SUBCUTANEOUS
  Administered 2019-12-14 – 2019-12-15 (×4): 2 [IU] via SUBCUTANEOUS
  Administered 2019-12-15: 3 [IU] via SUBCUTANEOUS

## 2019-12-05 MED ORDER — TICAGRELOR 90 MG PO TABS
90.0000 mg | ORAL_TABLET | Freq: Two times a day (BID) | ORAL | Status: DC
Start: 2019-12-05 — End: 2019-12-15

## 2019-12-05 MED ORDER — NITROGLYCERIN IN D5W 200-5 MCG/ML-% IV SOLN
2.0000 ug/min | INTRAVENOUS | Status: DC
  Filled 2019-12-05: qty 250

## 2019-12-05 MED ORDER — MIDAZOLAM HCL 2 MG/2ML IJ SOLN: 2.0000 mg | INTRAMUSCULAR | Status: DC | PRN

## 2019-12-05 MED ORDER — INSULIN REGULAR(HUMAN) IN NACL 100-0.9 UT/100ML-% IV SOLN
INTRAVENOUS | Status: DC
  Filled 2019-12-05: qty 100

## 2019-12-05 MED ORDER — HEPARIN (PORCINE) 25000 UT/250ML-% IV SOLN
1900.0000 [IU]/h | INTRAVENOUS | Status: DC
  Administered 2019-12-05 – 2019-12-06 (×2): 1400 [IU]/h via INTRAVENOUS
  Administered 2019-12-07 – 2019-12-13 (×11): 1700 [IU]/h via INTRAVENOUS
  Administered 2019-12-13 – 2019-12-14 (×2): 1900 [IU]/h via INTRAVENOUS
  Filled 2019-12-05 (×15): qty 250

## 2019-12-05 SURGICAL SUPPLY — 45 items
APL SRG 7X2 LUM MLBL SLNT (VASCULAR PRODUCTS)
APPLICATOR TIP COSEAL (VASCULAR PRODUCTS) IMPLANT
BLADE CLIPPER SURG (BLADE) ×1 IMPLANT
CATH ACCU-VU SIZ PIG 5F 100CM (CATHETERS) IMPLANT
CATH DIAG EXPO 6F AL1 (CATHETERS) ×1 IMPLANT
CATH INFINITI 6F MPB2 (CATHETERS) ×1 IMPLANT
CLIP VESOCCLUDE MED 24/CT (CLIP) IMPLANT
CLIP VESOCCLUDE SM WIDE 24/CT (CLIP) IMPLANT
DRAPE C-ARM 42X72 X-RAY (DRAPES) ×2 IMPLANT
DRAPE CV SPLIT W-CLR ANES SCRN (DRAPES) ×1 IMPLANT
DRAPE PERI GROIN 82X75IN TIB (DRAPES) ×1 IMPLANT
DRAPE SLUSH/WARMER DISC (DRAPES) ×1 IMPLANT
DRAPE UNIVERSAL (DRAPES) IMPLANT
FELT TEFLON 1X6 (MISCELLANEOUS) IMPLANT
GLOVE BIOGEL M STRL SZ7.5 (GLOVE) ×3 IMPLANT
GLOVE BIOGEL PI IND STRL 7.5 (GLOVE) IMPLANT
GLOVE BIOGEL PI INDICATOR 7.5 (GLOVE)
GOWN STRL REUS W/ TWL LRG LVL3 (GOWN DISPOSABLE) ×4 IMPLANT
GOWN STRL REUS W/TWL LRG LVL3 (GOWN DISPOSABLE)
INSERT FOGARTY SM (MISCELLANEOUS) ×1 IMPLANT
KIT BASIN OR (CUSTOM PROCEDURE TRAY) ×1 IMPLANT
LOOP VESSEL MINI RED (MISCELLANEOUS) ×1 IMPLANT
NS IRRIG 1000ML POUR BTL (IV SOLUTION) ×4 IMPLANT
PACK CHEST (CUSTOM PROCEDURE TRAY) ×1 IMPLANT
PAD ARMBOARD 7.5X6 YLW CONV (MISCELLANEOUS) ×2 IMPLANT
PAD ELECT DEFIB RADIOL ZOLL (MISCELLANEOUS) ×1 IMPLANT
PUMP SET IMPELLA 5.5 US (CATHETERS) ×1 IMPLANT
SEALANT SURG COSEAL 8ML (VASCULAR PRODUCTS) ×1 IMPLANT
STOPCOCK 4 WAY LG BORE MALE ST (IV SETS) IMPLANT
SUT ETHILON 3 0 PS 1 (SUTURE) ×2 IMPLANT
SUT PROLENE 5 0 C 1 36 (SUTURE) IMPLANT
SUT SILK  1 MH (SUTURE)
SUT SILK 1 MH (SUTURE) ×4 IMPLANT
SUT SILK 1 TIES 10X30 (SUTURE) ×1 IMPLANT
SUT SILK 2 0 SH CR/8 (SUTURE) IMPLANT
SYR 30ML LL (SYRINGE) IMPLANT
SYR BULB IRRIG 60ML STRL (SYRINGE) IMPLANT
TOWEL GREEN STERILE (TOWEL DISPOSABLE) ×1 IMPLANT
TOWEL GREEN STERILE FF (TOWEL DISPOSABLE) ×1 IMPLANT
TOWEL OR NON WOVEN STRL DISP B (DISPOSABLE) IMPLANT
TRAY CATH LUMEN 1 20CM STRL (SET/KITS/TRAYS/PACK) IMPLANT
TRAY FOLEY SLVR 16FR TEMP STAT (SET/KITS/TRAYS/PACK) ×1 IMPLANT
TUBING ART PRESS 48 MALE/FEM (TUBING) IMPLANT
WATER STERILE IRR 1000ML POUR (IV SOLUTION) ×2 IMPLANT
WIRE EMERALD 3MM-J .035X260CM (WIRE) IMPLANT

## 2019-12-05 SURGICAL SUPPLY — 9 items
CATH SWAN GANZ VIP 7.5F (CATHETERS) ×1 IMPLANT
KIT HEART LEFT (KITS) ×2 IMPLANT
PACK CARDIAC CATHETERIZATION (CUSTOM PROCEDURE TRAY) ×2 IMPLANT
PROTECTION STATION PRESSURIZED (MISCELLANEOUS) ×2
SHEATH PINNACLE 8F 10CM (SHEATH) ×1 IMPLANT
SHEATH PROBE COVER 6X72 (BAG) ×1 IMPLANT
SLEEVE REPOSITIONING LENGTH 30 (MISCELLANEOUS) ×1 IMPLANT
STATION PROTECTION PRESSURIZED (MISCELLANEOUS) IMPLANT
TRANSDUCER W/STOPCOCK (MISCELLANEOUS) ×3 IMPLANT

## 2019-12-05 NOTE — H&P (Addendum)
Advanced Heart Failure Team History and Physical Note   PCP:  Dion Body, MD  PCP-Cardiology: Dr. Fletcher Anon (new)  Reason for Admission: Post MI Cardiogenic Shock    HPI:    59 y/o, nonsmoker, AAM s/p remote renal transplant in 2011 at [facility] + h/o HTN, T2DM and HLD, initially admitted to [facility] on 10/17 with late presentation anterior STEMI. HS trop > 27,000. LHC showed severe 3VCAD w/ culprit lesion being an occluded mid LAD, that was treated w/ PCI +DES.  Also w/ significant disease involving the RCA and left circumflex. The whole distal RCA is diffusely diseased and not revascularizable. The left circumflex can be treated with staged PCI (defered at time of initial cath due to underlying CKD). LVG demonstrated severely reduced LVEF ~15%. 2D Echo showed EF ~20-25%, ?early thrombus formation at apex. RV ok.   Post cath course c/b cardiogenic shock w/ hypotension and AKI, SCr 1.4>>1.8>>3.1. AST 49, ALT 45. Started on empiric milrinone 0.25 mgc/kg/min. He is now transferred to [facility] for further management and AHF evaluation.    On arrival, he is current CP free. Denies dyspnea. SBPs in the 80s on transport. Mentating ok. Complains of mild diffuse abdominal pain/ bloating. His last BM was last Thursday. Abdominal plain film c/w ileus. Abdomen mildly distended w/ hypoactive bowel sounds     Emergent LHC 12/03/19  There is severe left ventricular systolic dysfunction.  LV end diastolic pressure is moderately elevated.  The left ventricular ejection fraction is less than 25% by visual estimate.  Mid Cx to Dist Cx lesion is 95% stenosed.  3rd Mrg lesion is 80% stenosed.  A drug-eluting stent was successfully placed using a STENT RESOLUTE ONYX 2.5X15.  Prox Cx lesion is 30% stenosed.  Mid RCA to Dist RCA lesion is 99% stenosed.  Mid LAD-1 lesion is 100% stenosed.  Post intervention, there is a 0% residual stenosis.  Mid LAD-2 lesion is 40% stenosed.  Prox LAD lesion is  30% stenosed.  1. Late presenting anterior ST elevation myocardial infarction. The culprit is an occluded mid LAD. However, the patient also has severe three-vessel coronary artery disease. There is also significant myocardial bridge in the distal LAD. 2. Severely reduced LV systolic function with an EF of 15% with mid to distal anterior, apical and distal inferior akinesis. No evidence of LV thrombus. 3. High normal systolic blood pressure and moderately elevated left ventricular end-diastolic pressure at 24 mmHg. 3. Successful angioplasty and drug-eluting stent placement to the mid LAD.    2D Echo  Echo contrast was given. There is heavy smoke in the apex with possible early thrombus formation. . Left ventricular ejection fraction, by estimation, is 20 to 25%. The left ventricle has severely decreased function. The left ventricle demonstrates regional wall motion abnormalities (see scoring diagram/findings for description). The left ventricular internal cavity size was mildly dilated. There is mild left ventricular hypertrophy. Left ventricular diastolic parameters are consistent with Grade I diastolic dysfunction (impaired relaxation). There is akinesis of the left ventricular, mid-apical anteroseptal wall, anterolateral wall and anterior segment. There is dyskinesis of the left ventricular, apical segment. 2. Right ventricular systolic function is normal. The right ventricular size is normal. 3. The mitral valve is normal in structure. No evidence of mitral valve regurgitation. No evidence of mitral stenosis. 4. The aortic valve is normal in structure. Aortic valve regurgitation is not visualized. Mild aortic valve sclerosis is present, with no evidence of aortic valve stenosis. 5. The inferior vena cava is normal  in size with greater than 50% respiratory variability, suggesting right atrial pressure of 3 mmHg.   Review of Systems: [y] = yes, [ ]  = no   General: Weight  gain [ ] ; Weight loss [ ] ; Anorexia [ ] ; Fatigue [ ] ; Fever [ ] ; Chills [ ] ; Weakness [ ]   Cardiac: Chest pain/pressure [ Y]; Resting SOB [ ] ; Exertional SOB [ ] ; Orthopnea [ ] ; Pedal Edema [ ] ; Palpitations [ ] ; Syncope [ ] ; Presyncope [ ] ; Paroxysmal nocturnal dyspnea[ ]   Pulmonary: Cough [ ] ; Wheezing[ ] ; Hemoptysis[ ] ; Sputum [ ] ; Snoring [ ]   GI: Vomiting[ ] ; Dysphagia[ ] ; Melena[ ] ; Hematochezia [ ] ; Heartburn[ ] ; Abdominal pain [Y ]; Constipation [ ] ; Diarrhea [ ] ; BRBPR [ ]   GU: Hematuria[ ] ; Dysuria [ ] ; Nocturia[ ]   Vascular: Pain in legs with walking [ ] ; Pain in feet with lying flat [ ] ; Non-healing sores [ ] ; Stroke [ ] ; TIA [ ] ; Slurred speech [ ] ;  Neuro: Headaches[ ] ; Vertigo[ ] ; Seizures[ ] ; Paresthesias[ ] ;Blurred vision [ ] ; Diplopia [ ] ; Vision changes [ ]   Ortho/Skin: Arthritis [ ] ; Joint pain [ ] ; Muscle pain [ ] ; Joint swelling [ ] ; Back Pain [ ] ; Rash [ ]   Psych: Depression[ ] ; Anxiety[ ]   Heme: Bleeding problems [ ] ; Clotting disorders [ ] ; Anemia [ ]   Endocrine: Diabetes [ ] ; Thyroid dysfunction[ ]    Home Medications Prior to Admission medications   Medication Sig Start Date End Date Taking? Authorizing Provider  aspirin 81 MG chewable tablet Chew 1 tablet (81 mg total) by mouth daily. 12/06/19   Wouk, Ailene Rud, MD  heparin 25000-0.45 UT/250ML-% infusion Inject 1,400 Units/hr into the vein continuous. 12/05/19   Wouk, Ailene Rud, MD  insulin aspart (NOVOLOG) 100 UNIT/ML injection Inject 0-9 Units into the skin every 4 (four) hours. 12/05/19   Wouk, Ailene Rud, MD  milrinone [facility]) 20 MG/100 ML SOLN infusion Inject 0.0231 mg/min into the vein continuous. 12/05/19   Wouk, Ailene Rud, MD  predniSONE (DELTASONE) 5 MG tablet Take 5 mg by mouth Daily. 01/18/11   [provider]  tacrolimus (PROGRAF) 1 MG capsule Take 5 mg by mouth 2 (two) times daily.     [provider]  ticagrelor (BRILINTA) 90 MG TABS tablet Take 1 tablet (90 mg total) by  mouth 2 (two) times daily. 12/05/19   Wouk, Ailene Rud, MD    Past Medical History: Past Medical History:  Diagnosis Date  . Chronic kidney disease 04/2009   Kidney Transplant  . Diabetes mellitus   . GERD (gastroesophageal reflux disease)    as needed reflux  . Hypertension     Past Surgical History: Past Surgical History:  Procedure Laterality Date  . 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 CORONARY/GRAFT ACUTE MI REVASCULARIZATION N/A 12/03/2019   Procedure: Coronary/Graft Acute MI Revascularization;  Surgeon: Wellington Hampshire, MD;  Location: [place] CV LAB;  Service: Cardiovascular;  Laterality: N/A;  . DIALYSIS FISTULA CREATION     last used 04/2009  . INSERTION OF DIALYSIS CATHETER     cordis dialysis catheter placement  . KIDNEY TRANSPLANT  2011  . LEFT HEART CATH AND CORONARY ANGIOGRAPHY N/A 12/03/2019   Procedure: LEFT HEART CATH AND CORONARY ANGIOGRAPHY;  Surgeon: Wellington Hampshire, MD;  Location:  [place] CV LAB;  Service: Cardiovascular;  Laterality: N/A;    Family History:  Family History  Problem Relation Age of Onset  . Diabetes Mother   . Hyperlipidemia Mother   . Cancer Father     Social History: Social History   Socioeconomic History  . Marital status: Married    Spouse name: Not on file  . Number of children: Not on file  . Years of education: Not on file  . Highest education level: Not on file  Occupational History  . Not on file  Tobacco Use  . Smoking status: Never Smoker  . Smokeless tobacco: Never Used  Substance and Sexual Activity  . Alcohol use: Yes    Comment: occassi  . Drug use: No  . Sexual activity: Not on file  Other Topics Concern  . Not on file  Social History Narrative  . Not on file   Social Determinants of Health   Financial  Resource Strain:   . Difficulty of Paying Living Expenses: Not on file  Food Insecurity:   . Worried About Charity fundraiser in the Last Year: Not on file  . Ran Out of Food in the Last Year: Not on file  Transportation Needs:   . Lack of Transportation (Medical): Not on file  . Lack of Transportation (Non-Medical): Not on file  Physical Activity:   . Days of Exercise per Week: Not on file  . Minutes of Exercise per Session: Not on file  Stress:   . Feeling of Stress : Not on file  Social Connections:   . Frequency of Communication with Friends and Family: Not on file  . Frequency of Social Gatherings with Friends and Family: Not on file  . Attends Religious Services: Not on file  . Active Member of Clubs or Organizations: Not on file  . Attends Archivist Meetings: Not on file  . Marital Status: Not on file    Allergies:  No Known Allergies  Objective:    Vital Signs:   Temp:  [97.2 F (36.2 C)-99 F (37.2 C)] 97.6 F (36.4 C) (10/19 1200) Pulse Rate:  [94-125] 120 (10/19 1200) Resp:  [16-55] 31 (10/19 1200) BP: (77-130)/(52-93) 86/66 (10/19 1200) SpO2:  [94 %-100 %] 97 % (10/19 1200) Weight:  [94.5 kg] 94.5 kg (10/19 0330)   There were no vitals filed for this visit.   Physical Exam     General:  Well appearing/ mildly fatigue looking. No respiratory difficulty HEENT: Normal Neck: Supple. JVD ~8-9 cm. Carotids 2+ bilat; no bruits. No lymphadenopathy or thyromegaly appreciated. Cor: PMI nondisplaced. Regular rhythm, tachy rate. No rubs, gallops or murmurs. Lungs: Clear Abdomen: mid diffuse tenderness,  Mildly distended, hypoactive BS. No hepatosplenomegaly. No bruits or masses.  Extremities: No cyanosis, clubbing, rash, trace bilateral LE edema Neuro: Alert & oriented x 3, cranial nerves grossly intact. moves all 4 extremities w/o difficulty. Affect pleasant.   Telemetry   Sinus tach 120s   EKG   Admit EKG showed sinus tach 111 bpm w/  anterolateral STE  Labs     Basic Metabolic Panel: Recent Labs  Lab 12/03/19 1956 12/03/19 1956 12/04/19 0034 12/04/19 1245 12/05/19 0725  NA 136  --  134* 135 132*  K 4.7  --  4.8 4.9 4.6  CL 100  --  102 103 99  CO2 25  --  22 21* 19*  GLUCOSE 221*  --  228* 207* 244*  BUN 18  --  20 30* 53*  CREATININE 1.46*  --  1.53* 1.86* 3.10*  CALCIUM 10.3   < > 9.5 9.7 9.8  MG  --   --  2.1  --   --   PHOS  --   --  2.9  --   --    < > = values in this interval not displayed.    Liver Function Tests: Recent Labs  Lab 12/03/19 1956 12/04/19 0034 12/05/19 0725  AST 267* 245* 79*  ALT 82* 67* 45*  ALKPHOS 89 75 78  BILITOT 1.4* 1.2 1.2  PROT 8.5* 6.7 7.1  ALBUMIN 4.4 3.6 3.2*   No results for input(s): LIPASE, AMYLASE in the last 168 hours. No results for input(s): AMMONIA in the last 168 hours.  CBC: Recent Labs  Lab 12/03/19 1956 12/04/19 0035 12/05/19 0725  WBC 23.9* 22.9* 24.0*  NEUTROABS  --  18.5*  --   HGB 17.4* 15.3 14.4  HCT 52.1* 46.1 43.1  MCV 84.2 83.4 83.0  PLT 303 314 316    Cardiac Enzymes: Recent Labs  Lab 12/03/19 2233  CKTOTAL 2,183*    BNP: BNP (last 3 results) No results for input(s): BNP in the last 8760 hours.  ProBNP (last 3 results) No results for input(s): PROBNP in the last 8760 hours.   CBG: Recent Labs  Lab 12/04/19 2012 12/04/19 2321 12/05/19 0328 12/05/19 0732 12/05/19 1122  GLUCAP 190* 198* 201* 235* 285*    Coagulation Studies: Recent Labs    12/03/19 1956  LABPROT 13.8  INR 1.1    Imaging: DG Abd 1 View  Result Date: 12/05/2019 CLINICAL DATA:  Abdominal distension.  Admitted with STEMI. EXAM: ABDOMEN - 1 VIEW COMPARISON:  05/19/2015 FINDINGS: Diffuse gas dilated bowel primarily affecting stomach and small bowel with gas seen to the level of the ileum. Proximal colon is distended with remaining colon not well visualized. No rectal impaction or colonic volvulus appearance. No concerning mass effect or  evidence of extraluminal gas, although supine. IMPRESSION: Diffuse gas dilated bowel. Although the distal colon is less affected, ileus is still favored. Electronically Signed   By: Monte Fantasia M.D.   On: 12/05/2019 09:32   CARDIAC CATHETERIZATION  Result Date: 12/03/2019  There is severe left ventricular systolic dysfunction.  LV end diastolic pressure is moderately elevated.  The left ventricular ejection fraction is less than 25% by visual estimate.  Mid Cx to Dist Cx lesion is 95% stenosed.  3rd Mrg lesion is 80% stenosed.  A drug-eluting stent was successfully placed using a STENT RESOLUTE ONYX 2.5X15.  Prox Cx lesion is 30% stenosed.  Mid RCA to Dist RCA lesion is 99% stenosed.  Mid LAD-1 lesion is 100% stenosed.  Post intervention, there is a 0% residual stenosis.  Mid LAD-2 lesion is 40% stenosed.  Prox LAD lesion is 30% stenosed.  1.  Late presenting anterior ST elevation myocardial infarction.  The culprit is an occluded mid LAD.  However, the patient also has severe three-vessel coronary artery disease.  There is also significant myocardial bridge in the distal LAD. 2.  Severely reduced LV systolic function with an EF of 15% with mid to distal anterior, apical and distal inferior akinesis.  No evidence of LV thrombus. 3.  High normal systolic blood pressure and moderately elevated left ventricular end-diastolic pressure at 24 mmHg. 3.  Successful angioplasty and drug-eluting stent placement to the mid LAD. Recommendations: In spite of late presentation and significant underlying three-vessel coronary artery disease, the  patient was not in cardiogenic shock.  I elected to revascularize the LAD in spite of late presentation given continued chest pain and EKG changes.  The patient reported resolution of chest pain by the end of the procedure. He continues to have significant disease involving the RCA and left circumflex.  The whole distal RCA is diffusely diseased and not  revascularizable.  The left circumflex can be treated with staged PCI before hospital discharge.  I elected not to do that today given underlying chronic kidney disease and kidney transplant status. Continue dual antiplatelet therapy for at least 1 year. Aggressive treatment of risk factors. We will avoid hydration given significant reduced EF and at the same time will only diuresis if needed based on his respiratory status to decrease the chance of contrast-induced nephropathy.   DG Chest Portable 1 View  Result Date: 12/03/2019 CLINICAL DATA:  Chest pain EXAM: PORTABLE CHEST 1 VIEW COMPARISON:  03/06/2011 FINDINGS: Low lung volumes. Heart and mediastinal contours are within normal limits. No focal opacities or effusions. No acute bony abnormality. IMPRESSION: No active disease. Electronically Signed   By: Rolm Baptise M.D.   On: 12/03/2019 20:30   ECHOCARDIOGRAM COMPLETE  Result Date: 12/04/2019    ECHOCARDIOGRAM REPORT   Patient Name:   Casey [name]. Date of Exam: 12/04/2019 Medical Rec #:  [id number]           Height:       73.0 in Accession #:    [id number]          Weight:       203.9 lb Date of Birth:  [birth year]            BSA:          2.169 m Patient Age:    77 years            BP:           92/75 mmHg Patient Gender: M                   HR:           90 bpm. Exam Location:  [facility] Procedure: 2D Echo, Color Doppler, Cardiac Doppler and Intracardiac            Opacification Agent Indications:     Acute myocardial infarction  History:         Patient has no prior history of Echocardiogram examinations.                  CKD; Risk Factors:Hypertension and Diabetes.  Sonographer:     Charmayne Sheer RDCS (AE) Referring Phys:  [place] Diagnosing Phys: Kathlyn Sacramento MD  Sonographer Comments: No subcostal window. IMPRESSIONS  1. Echo contrast was given. There is heavy smoke in the apex with possible early thrombus formation. . Left ventricular ejection fraction, by estimation, is 20 to 25%.  The left ventricle has severely decreased function. The left ventricle demonstrates regional wall motion abnormalities (see scoring diagram/findings for description). The left ventricular internal cavity size was mildly dilated. There is mild left ventricular hypertrophy. Left ventricular diastolic parameters are consistent with Grade I  diastolic dysfunction (impaired relaxation). There is akinesis of the left ventricular, mid-apical anteroseptal wall, anterolateral wall and anterior segment. There is dyskinesis of the left ventricular, apical segment.  2. Right ventricular systolic function is normal. The right ventricular size is normal.  3. The mitral valve is normal in structure. No evidence of  mitral valve regurgitation. No evidence of mitral stenosis.  4. The aortic valve is normal in structure. Aortic valve regurgitation is not visualized. Mild aortic valve sclerosis is present, with no evidence of aortic valve stenosis.  5. The inferior vena cava is normal in size with greater than 50% respiratory variability, suggesting right atrial pressure of 3 mmHg. FINDINGS  Left Ventricle: Echo contrast was given. There is heavy smoke in the apex with possible early thrombus formation. Left ventricular ejection fraction, by estimation, is 20 to 25%. The left ventricle has severely decreased function. The left ventricle demonstrates regional wall motion abnormalities. Definity contrast agent was given IV to delineate the left ventricular endocardial borders. The left ventricular internal cavity size was mildly dilated. There is mild left ventricular hypertrophy. Left ventricular diastolic parameters are consistent with Grade I diastolic dysfunction (impaired relaxation). Right Ventricle: The right ventricular size is normal. No increase in right ventricular wall thickness. Right ventricular systolic function is normal. Left Atrium: Left atrial size was normal in size. Right Atrium: Right atrial size was normal in size.  Pericardium: There is no evidence of pericardial effusion. Mitral Valve: The mitral valve is normal in structure. There is mild calcification of the mitral valve leaflet(s). Mild mitral annular calcification. No evidence of mitral valve regurgitation. No evidence of mitral valve stenosis. Tricuspid Valve: The tricuspid valve is normal in structure. Tricuspid valve regurgitation is not demonstrated. No evidence of tricuspid stenosis. Aortic Valve: The aortic valve is normal in structure. Aortic valve regurgitation is not visualized. Mild aortic valve sclerosis is present, with no evidence of aortic valve stenosis. Aortic valve mean gradient measures 3.0 mmHg. Aortic valve peak gradient measures 5.9 mmHg. Aortic valve area, by VTI measures 1.97 cm. Pulmonic Valve: The pulmonic valve was normal in structure. Pulmonic valve regurgitation is not visualized. No evidence of pulmonic stenosis. Aorta: The aortic root is normal in size and structure. Venous: The inferior vena cava is normal in size with greater than 50% respiratory variability, suggesting right atrial pressure of 3 mmHg. IAS/Shunts: No atrial level shunt detected by color flow Doppler.  LEFT VENTRICLE PLAX 2D LVIDd:         4.27 cm  Diastology LVIDs:         3.51 cm  LV e' medial:    3.92 cm/s LV PW:         1.38 cm  LV E/e' medial:  12.9 LV IVS:        0.98 cm  LV e' lateral:   4.68 cm/s LVOT diam:     1.80 cm  LV E/e' lateral: 10.8 LV SV:         31 LV SV Index:   14 LVOT Area:     2.54 cm  RIGHT VENTRICLE RV Basal diam:  2.35 cm LEFT ATRIUM             Index       RIGHT ATRIUM          Index LA diam:        2.80 cm 1.29 cm/m  RA Area:     9.31 cm LA Vol (A2C):   18.6 ml 8.57 ml/m  RA Volume:   15.80 ml 7.28 ml/m LA Vol (A4C):   29.8 ml 13.74 ml/m LA Biplane Vol: 25.0 ml 11.52 ml/m  AORTIC VALVE                   PULMONIC VALVE AV Area (Vmax):    1.72  cm    PV Vmax:       1.00 m/s AV Area (Vmean):   1.76 cm    PV Vmean:      68.800 cm/s AV Area  (VTI):     1.97 cm    PV VTI:        0.136 m AV Vmax:           121.00 cm/s PV Peak grad:  4.0 mmHg AV Vmean:          83.200 cm/s PV Mean grad:  2.0 mmHg AV VTI:            0.155 m AV Peak Grad:      5.9 mmHg AV Mean Grad:      3.0 mmHg LVOT Vmax:         81.60 cm/s LVOT Vmean:        57.600 cm/s LVOT VTI:          0.120 m LVOT/AV VTI ratio: 0.77  AORTA Ao Root diam: 2.90 cm MITRAL VALVE MV Area (PHT): 5.20 cm    SHUNTS MV Decel Time: 146 msec    Systemic VTI:  0.12 m MV E velocity: 50.60 cm/s  Systemic Diam: 1.80 cm MV A velocity: 63.80 cm/s MV E/A ratio:  0.79 Kathlyn Sacramento MD Electronically signed by Kathlyn Sacramento MD Signature Date/Time: 12/04/2019/12:50:37 PM    Final       Patient Profile   59 y/o, non smoker, AAM s/p remote renal transplant in 2011 at [facility] + h/o HTN, T2DM and HLD admitted for late presentation anterior MI s/p PCI to mLAD (culprit) w/ residual severe disease in RCA and LCx, transferred from [facility] for a/c systolic HF>>cardiogenic shock + AKI. LVEF 20-25%, RV normal.    Assessment/Plan   1. CAD s/p Acute STEMI - delayed presentation  - HS trop >27,000 - leukocytosis w/ WBC elevated at 24K associated w/ poor prognosis - Emergent LHC showed severe 3VCAD w/ culprit lesion being occluded mid LAD, treated w/ PCI + DES.  The whole distal RCA is diffusely diseased and not revascularizable. The left circumflex can be treated with staged PCI (defered at time of initial cath due to underlying CKD).  - LVG demonstrated severely reduced LVEF ~15%. No evidence of LV thrombus. 2D Echo showed EF ~20-25%. RV ok.  - denies further CP  - continue IV heparin  - continue DAPT w/ ASA + Brilinta + high intensity statin - no ? blocker w/ shock  - consider staged PCI of LCx once renal functions stabilizes    2. Acute Systolic Heart Failure>>Cardiogenic Shock  - EF 20-25% on Echo post acute MI. RV ok. No prior study for comparison  - now s/p revascularization of mLAD c/b cardiogenic  shock  - continue milrinone 0.25 - CCM consulted for placement of CVC and arterial line for close hemodynamic monitoring - will follow Co-ox and CVPs - place aline for BP monitoring  - check lactic acid  - may need mechanical support w/ impella   3. 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 Start Levophed to help keep SBP > 100 for renal perfusion    4. HLD - LDL elevated at 185 mg/dL  -  Goal in setting of 3VCAD and DM < 70 mg/dL  - on Crestor 20 mg PTA - increase Crestor to 40 mg qhs - repeat FLP and HFTs in 6-8 weeks  5. T2DM  - Hgb A1c 7.8  - GFR too low for SGLT2i   6. Abdominal Pain  - last BM was 5 days ago - abdominal plain film c/w ileus   - AST 79, ALT 45  - check lactic acid   - may need CT scan   Lyda Jester, PA-C 12/05/2019, 1:13 PM  Advanced Heart Failure Team Pager [phone] (M-F; 7a - 4p)  Please contact [facility] for night-coverage after hours (4p -7a ) and weekends on [email or web address]  Patient seen with PA, agree with the above note.   Patient with history renal transplant in 2011 (creatinine 1.4 at admission), DM2, HTN, hyperlipidemia but no known cardiac disease presented to [facility] with late presentation anterior MI.  Patient had chest pain fairly continuously starting Friday, came to the ER on Sunday and was found to have anterior MI on ECG.  LHC was done, occluded mid LAD was culprit, s/p DES to mLAD.  Also with residual 95% mid-distal LCx stenosis, 80% OM3 stenosis, and severe diffuse 99% mid-distal RCA disease that does not appear revascularizable. Echo showed EF 20-25% with possible early/forming LV thrombus and normal RV.  Post-procedure, patient developed AKI with steady rise in creatinine  from 1.4 => 3.1 today. SBP in 80s-90s with diffuse mild abdominal pain, ileus on AXR. Sinus tachy around 120 bpm.  He was started on milrinone 0.25 at [facility] and transferred to [facility].   General: NAD Neck: JVP 8-9 cm, no thyromegaly or thyroid nodule.  Lungs: Clear to auscultation bilaterally with normal respiratory effort. CV: Nondisplaced PMI.  Heart tachy, regular S1/S2, +S3, no murmur.  No peripheral edema.  No carotid bruit.  Normal pedal pulses.  Abdomen: Soft, nontender, no hepatosplenomegaly, no distention.  Skin: Intact without lesions or rashes.  Neurologic: Alert and oriented x 3.  Psych: Normal affect. Extremities: No clubbing or cyanosis.  HEENT: Normal.   1. Cardiogenic shock: Ischemic cardiomyopathy with EF 20-25% post-late presentation anterior MI.  Tachycardic and hypotensive today on milrinone 0.25 mcg/kg/min.  AKI.  Diffuse abdominal pain may be due to low flow/CO.  - Continue milrinone gtt 0.25.  - Check lactate now.  - Will take to cath lab for [facility] placement (leave in place).  - Place arterial line, may need to add norepinephrine gtt.  - If CO is significantly decreased, favor Impella 5.5 in OR to allow him time to recover (discussed with Dr. Prescott Gum).  In future, could be LVAD candidate but just had LAD stent.  - If stabilizes and creatinine comes down, consider PCI to LCx system.  2. CAD: Delayed presentation anterior MI (CP began 10/15, PCI on 10/17).  He had DES to LAD, has residual severe up to 99% mid to distal RCA stenosis that is not revascularizable.  He has 95% mid-distal LCx stenosis and 80% OM3 stenosis that could be intervened upon. No chest pain.  - He is on Brilinta 90 bid + ASA 81 + statin post-MI.  3. AKI on CKD stage 3: Patient has history of renal transplant at [facility] in 2011.  Baseline creatinine 1.4, up to 3.1 today.  Suspect combination of contrast-induced nephropathy and cardiorenal syndrome in setting of cardiogenic shock.  - Tacrolimus dose has been  decreased, level sent today from [facility].  Will follow.  - Will consult  nephrology.  - Support cardiac output and MAP.  4. Elevated LFTs: Mild elevation, suspect shock liver with hypotension/cardiogenic shock.  5. DM2: SSI.  6. Abdominal pain: Diffuse mild tenderness.  Plain films with ileus.  May be related to low output.  If no improvement, will need CT for evaluation.  - Will cover empirically with antibiotics for now, Zosyn.  7. Sludge LV apex/early thrombus: He is on heparin gtt.   Loralie Champagne 12/05/2019 3:19 PM

## 2019-12-05 NOTE — Progress Notes (Addendum)
[facility]  ROUNDING NOTE   Subjective:   Casey [name] is 59 y.o. male with medical problems of end-stage renal disease, status post kidney transplant 2011, diabetes, hypertension, hyperlipidemia who was admitted to [facility] on 12/03/2019 for evaluation of chest pain and is found to have STEMI. Severe three-vessel coronary disease was noted along with occluded mid LAD.  One drug-eluting stent was placed.  Further intervention is planned in left distal left circumflex.  Right coronary is diffusely diseased and not suitable for revascularization. He was consulted to Nephrology for AKI.    Objective:  Vital signs in last 24 hours:  Temp:  [97.2 F (36.2 C)-99 F (37.2 C)] 97.6 F (36.4 C) (10/19 1200) Pulse Rate:  [94-125] 120 (10/19 1200) Resp:  [16-55] 26 (10/19 1310) BP: (77-130)/(52-93) 96/63 (10/19 1310) SpO2:  [94 %-100 %] 97 % (10/19 1200) Weight:  [94.5 kg] 94.5 kg (10/19 0330)  Weight change:  There were no vitals filed for this visit.  Intake/Output: No intake/output data recorded.   Intake/Output this shift:  No intake/output data recorded.  Physical Exam: General: Resting in bed, in  no acute distress  Head: Normocephalic, atraumatic. Moist oral mucosal membranes  Eyes: Anicteric  Neck: Supple, trachea at midline  Lungs:  Clear to auscultation  Heart: Tachycardic in low 100's  Abdomen:  Soft, nontender, non distended  Extremities:  Trace  peripheral edema.  Neurologic: Oriented, moving all four extremities  Skin: No acute lesions or rashes    Basic Metabolic Panel: Recent Labs  Lab 12/03/19 1956 12/03/19 1956 12/04/19 0034 12/04/19 1245 12/05/19 0725  NA 136  --  134* 135 132*  K 4.7  --  4.8 4.9 4.6  CL 100  --  102 103 99  CO2 25  --  22 21* 19*  GLUCOSE 221*  --  228* 207* 244*  BUN 18  --  20 30* 53*  CREATININE 1.46*  --  1.53* 1.86* 3.10*  CALCIUM 10.3   < > 9.5 9.7 9.8  MG  --   --  2.1  --   --   PHOS  --   --  2.9  --   --    < > =  values in this interval not displayed.    Liver Function Tests: Recent Labs  Lab 12/03/19 1956 12/04/19 0034 12/05/19 0725  AST 267* 245* 79*  ALT 82* 67* 45*  ALKPHOS 89 75 78  BILITOT 1.4* 1.2 1.2  PROT 8.5* 6.7 7.1  ALBUMIN 4.4 3.6 3.2*   No results for input(s): LIPASE, AMYLASE in the last 168 hours. No results for input(s): AMMONIA in the last 168 hours.  CBC: Recent Labs  Lab 12/03/19 1956 12/04/19 0035 12/05/19 0725  WBC 23.9* 22.9* 24.0*  NEUTROABS  --  18.5*  --   HGB 17.4* 15.3 14.4  HCT 52.1* 46.1 43.1  MCV 84.2 83.4 83.0  PLT 303 314 316    Cardiac Enzymes: Recent Labs  Lab 12/03/19 2233  CKTOTAL 2,183*    BNP: Invalid input(s): POCBNP  CBG: Recent Labs  Lab 12/04/19 2012 12/04/19 2321 12/05/19 0328 12/05/19 0732 12/05/19 1122  GLUCAP 190* 198* 201* 235* 285*    Microbiology: Results for orders placed or performed during the hospital encounter of 12/03/19  Respiratory Panel by RT PCR (Flu A&B, Covid) - Nasopharyngeal Swab     Status: None   Collection Time: 12/03/19  7:56 PM   Specimen: Nasopharyngeal Swab  Result Value Ref Range Status  SARS Coronavirus 2 by RT PCR NEGATIVE NEGATIVE Final    Comment: (NOTE) SARS-CoV-2 target nucleic acids are NOT DETECTED.  The SARS-CoV-2 RNA is generally detectable in upper respiratoy specimens during the acute phase of infection. The lowest concentration of SARS-CoV-2 viral copies this assay can detect is 131 copies/mL. A negative result does not preclude SARS-Cov-2 infection and should not be used as the sole basis for treatment or other patient management decisions. A negative result may occur with  improper specimen collection/handling, submission of specimen other than nasopharyngeal swab, presence of viral mutation(s) within the areas targeted by this assay, and inadequate number of viral copies (<131 copies/mL). A negative result must be combined with clinical observations, patient  history, and epidemiological information. The expected result is Negative.  Fact Sheet for Patients:  PinkCheek.be  Fact Sheet for Healthcare Providers:  GravelBags.it  This test is no t yet approved or cleared by the [place] FDA and  has been authorized for detection and/or diagnosis of SARS-CoV-2 by FDA under an Emergency Use Authorization (EUA). This EUA will remain  in effect (meaning this test can be used) for the duration of the COVID-19 declaration under Section 564(b)(1) of the Act, 21 U.S.C. section 360bbb-3(b)(1), unless the authorization is terminated or revoked sooner.     Influenza A by PCR NEGATIVE NEGATIVE Final   Influenza B by PCR NEGATIVE NEGATIVE Final    Comment: (NOTE) The Xpert Xpress SARS-CoV-2/FLU/RSV assay is intended as an aid in  the diagnosis of influenza from Nasopharyngeal swab specimens and  should not be used as a sole basis for treatment. Nasal washings and  aspirates are unacceptable for Xpert Xpress SARS-CoV-2/FLU/RSV  testing.  Fact Sheet for Patients: PinkCheek.be  Fact Sheet for Healthcare Providers: GravelBags.it  This test is not yet approved or cleared by the [place] FDA and  has been authorized for detection and/or diagnosis of SARS-CoV-2 by  FDA under an Emergency Use Authorization (EUA). This EUA will remain  in effect (meaning this test can be used) for the duration of the  Covid-19 declaration under Section 564(b)(1) of the Act, 21  U.S.C. section 360bbb-3(b)(1), unless the authorization is  terminated or revoked. Performed at [facility], [address] [place], [place] [address]   MRSA PCR Screening     Status: None   Collection Time: 12/04/19 12:03 AM   Specimen: Nasopharyngeal  Result Value Ref Range Status   MRSA by PCR NEGATIVE NEGATIVE Final    Comment:        The GeneXpert MRSA  Assay (FDA approved for NASAL specimens only), is one component of a comprehensive MRSA colonization surveillance program. It is not intended to diagnose MRSA infection nor to guide or monitor treatment for MRSA infections. Performed at [facility], [address] [place], [place] [address]     Coagulation Studies: Recent Labs    12/03/19 1956  LABPROT 13.8  INR 1.1    Urinalysis: No results for input(s): COLORURINE, LABSPEC, PHURINE, GLUCOSEU, HGBUR, BILIRUBINUR, KETONESUR, PROTEINUR, UROBILINOGEN, NITRITE, LEUKOCYTESUR in the last 72 hours.  Invalid input(s): APPERANCEUR    Imaging: DG Abd 1 View  Result Date: 12/05/2019 CLINICAL DATA:  Abdominal distension.  Admitted with STEMI. EXAM: ABDOMEN - 1 VIEW COMPARISON:  05/19/2015 FINDINGS: Diffuse gas dilated bowel primarily affecting stomach and small bowel with gas seen to the level of the ileum. Proximal colon is distended with remaining colon not well visualized. No rectal impaction or colonic volvulus appearance. No concerning  mass effect or evidence of extraluminal gas, although supine. IMPRESSION: Diffuse gas dilated bowel. Although the distal colon is less affected, ileus is still favored. Electronically Signed   By: Monte Fantasia M.D.   On: 12/05/2019 09:32   CARDIAC CATHETERIZATION  Result Date: 12/03/2019  There is severe left ventricular systolic dysfunction.  LV end diastolic pressure is moderately elevated.  The left ventricular ejection fraction is less than 25% by visual estimate.  Mid Cx to Dist Cx lesion is 95% stenosed.  3rd Mrg lesion is 80% stenosed.  A drug-eluting stent was successfully placed using a STENT RESOLUTE ONYX 2.5X15.  Prox Cx lesion is 30% stenosed.  Mid RCA to Dist RCA lesion is 99% stenosed.  Mid LAD-1 lesion is 100% stenosed.  Post intervention, there is a 0% residual stenosis.  Mid LAD-2 lesion is 40% stenosed.  Prox LAD lesion is 30% stenosed.  1.  Late presenting  anterior ST elevation myocardial infarction.  The culprit is an occluded mid LAD.  However, the patient also has severe three-vessel coronary artery disease.  There is also significant myocardial bridge in the distal LAD. 2.  Severely reduced LV systolic function with an EF of 15% with mid to distal anterior, apical and distal inferior akinesis.  No evidence of LV thrombus. 3.  High normal systolic blood pressure and moderately elevated left ventricular end-diastolic pressure at 24 mmHg. 3.  Successful angioplasty and drug-eluting stent placement to the mid LAD. Recommendations: In spite of late presentation and significant underlying three-vessel coronary artery disease, the patient was not in cardiogenic shock.  I elected to revascularize the LAD in spite of late presentation given continued chest pain and EKG changes.  The patient reported resolution of chest pain by the end of the procedure. He continues to have significant disease involving the RCA and left circumflex.  The whole distal RCA is diffusely diseased and not revascularizable.  The left circumflex can be treated with staged PCI before hospital discharge.  I elected not to do that today given underlying chronic kidney disease and kidney transplant status. Continue dual antiplatelet therapy for at least 1 year. Aggressive treatment of risk factors. We will avoid hydration given significant reduced EF and at the same time will only diuresis if needed based on his respiratory status to decrease the chance of contrast-induced nephropathy.   DG Chest Portable 1 View  Result Date: 12/03/2019 CLINICAL DATA:  Chest pain EXAM: PORTABLE CHEST 1 VIEW COMPARISON:  03/06/2011 FINDINGS: Low lung volumes. Heart and mediastinal contours are within normal limits. No focal opacities or effusions. No acute bony abnormality. IMPRESSION: No active disease. Electronically Signed   By: Rolm Baptise M.D.   On: 12/03/2019 20:30   ECHOCARDIOGRAM COMPLETE  Result Date:  12/04/2019    ECHOCARDIOGRAM REPORT   Patient Name:   Amun Stemm. Date of Exam: 12/04/2019 Medical Rec #:  [id number]           Height:       73.0 in Accession #:    [id number]          Weight:       203.9 lb Date of Birth:  [birth year]            BSA:          2.169 m Patient Age:    17 years            BP:           92/75 mmHg Patient  Gender: M                   HR:           90 bpm. Exam Location:  [facility] Procedure: 2D Echo, Color Doppler, Cardiac Doppler and Intracardiac            Opacification Agent Indications:     Acute myocardial infarction  History:         Patient has no prior history of Echocardiogram examinations.                  CKD; Risk Factors:Hypertension and Diabetes.  Sonographer:     Charmayne Sheer RDCS (AE) Referring Phys:  [place] Diagnosing Phys: Kathlyn Sacramento MD  Sonographer Comments: No subcostal window. IMPRESSIONS  1. Echo contrast was given. There is heavy smoke in the apex with possible early thrombus formation. . Left ventricular ejection fraction, by estimation, is 20 to 25%. The left ventricle has severely decreased function. The left ventricle demonstrates regional wall motion abnormalities (see scoring diagram/findings for description). The left ventricular internal cavity size was mildly dilated. There is mild left ventricular hypertrophy. Left ventricular diastolic parameters are consistent with Grade I  diastolic dysfunction (impaired relaxation). There is akinesis of the left ventricular, mid-apical anteroseptal wall, anterolateral wall and anterior segment. There is dyskinesis of the left ventricular, apical segment.  2. Right ventricular systolic function is normal. The right ventricular size is normal.  3. The mitral valve is normal in structure. No evidence of mitral valve regurgitation. No evidence of mitral stenosis.  4. The aortic valve is normal in structure. Aortic valve regurgitation is not visualized. Mild aortic valve sclerosis is present, with no  evidence of aortic valve stenosis.  5. The inferior vena cava is normal in size with greater than 50% respiratory variability, suggesting right atrial pressure of 3 mmHg. FINDINGS  Left Ventricle: Echo contrast was given. There is heavy smoke in the apex with possible early thrombus formation. Left ventricular ejection fraction, by estimation, is 20 to 25%. The left ventricle has severely decreased function. The left ventricle demonstrates regional wall motion abnormalities. Definity contrast agent was given IV to delineate the left ventricular endocardial borders. The left ventricular internal cavity size was mildly dilated. There is mild left ventricular hypertrophy. Left ventricular diastolic parameters are consistent with Grade I diastolic dysfunction (impaired relaxation). Right Ventricle: The right ventricular size is normal. No increase in right ventricular wall thickness. Right ventricular systolic function is normal. Left Atrium: Left atrial size was normal in size. Right Atrium: Right atrial size was normal in size. Pericardium: There is no evidence of pericardial effusion. Mitral Valve: The mitral valve is normal in structure. There is mild calcification of the mitral valve leaflet(s). Mild mitral annular calcification. No evidence of mitral valve regurgitation. No evidence of mitral valve stenosis. Tricuspid Valve: The tricuspid valve is normal in structure. Tricuspid valve regurgitation is not demonstrated. No evidence of tricuspid stenosis. Aortic Valve: The aortic valve is normal in structure. Aortic valve regurgitation is not visualized. Mild aortic valve sclerosis is present, with no evidence of aortic valve stenosis. Aortic valve mean gradient measures 3.0 mmHg. Aortic valve peak gradient measures 5.9 mmHg. Aortic valve area, by VTI measures 1.97 cm. Pulmonic Valve: The pulmonic valve was normal in structure. Pulmonic valve regurgitation is not visualized. No evidence of pulmonic stenosis. Aorta:  The aortic root is normal in size and structure. Venous: The inferior vena cava is normal in  size with greater than 50% respiratory variability, suggesting right atrial pressure of 3 mmHg. IAS/Shunts: No atrial level shunt detected by color flow Doppler.  LEFT VENTRICLE PLAX 2D LVIDd:         4.27 cm  Diastology LVIDs:         3.51 cm  LV e' medial:    3.92 cm/s LV PW:         1.38 cm  LV E/e' medial:  12.9 LV IVS:        0.98 cm  LV e' lateral:   4.68 cm/s LVOT diam:     1.80 cm  LV E/e' lateral: 10.8 LV SV:         31 LV SV Index:   14 LVOT Area:     2.54 cm  RIGHT VENTRICLE RV Basal diam:  2.35 cm LEFT ATRIUM             Index       RIGHT ATRIUM          Index LA diam:        2.80 cm 1.29 cm/m  RA Area:     9.31 cm LA Vol (A2C):   18.6 ml 8.57 ml/m  RA Volume:   15.80 ml 7.28 ml/m LA Vol (A4C):   29.8 ml 13.74 ml/m LA Biplane Vol: 25.0 ml 11.52 ml/m  AORTIC VALVE                   PULMONIC VALVE AV Area (Vmax):    1.72 cm    PV Vmax:       1.00 m/s AV Area (Vmean):   1.76 cm    PV Vmean:      68.800 cm/s AV Area (VTI):     1.97 cm    PV VTI:        0.136 m AV Vmax:           121.00 cm/s PV Peak grad:  4.0 mmHg AV Vmean:          83.200 cm/s PV Mean grad:  2.0 mmHg AV VTI:            0.155 m AV Peak Grad:      5.9 mmHg AV Mean Grad:      3.0 mmHg LVOT Vmax:         81.60 cm/s LVOT Vmean:        57.600 cm/s LVOT VTI:          0.120 m LVOT/AV VTI ratio: 0.77  AORTA Ao Root diam: 2.90 cm MITRAL VALVE MV Area (PHT): 5.20 cm    SHUNTS MV Decel Time: 146 msec    Systemic VTI:  0.12 m MV E velocity: 50.60 cm/s  Systemic Diam: 1.80 cm MV A velocity: 63.80 cm/s MV E/A ratio:  0.79 Kathlyn Sacramento MD Electronically signed by Kathlyn Sacramento MD Signature Date/Time: 12/04/2019/12:50:37 PM    Final      Medications:    [START ON 12/06/2019] sodium chloride     sodium chloride     heparin     milrinone     norepinephrine (LEVOPHED) Adult infusion      [START ON 12/06/2019] aspirin EC  81 mg Oral Daily    insulin aspart  0-15 Units Subcutaneous TID WC   insulin aspart  0-5 Units Subcutaneous QHS   [START ON 12/06/2019] insulin glargine  10 Units Subcutaneous Daily   [START ON 12/06/2019] predniSONE  5 mg Oral Q breakfast   [  START ON 12/06/2019] rosuvastatin  40 mg Oral Daily   sodium chloride flush  3 mL Intravenous Q12H   tacrolimus  3 mg Oral BID   ticagrelor  90 mg Oral BID     Assessment/ Plan:  Casey [name]. is a 58 y.o.  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  On Milrinone 0.25 mcg/kg/min  # Apical thrombus -  On Heparin 1400 units/hr  # Diabetes Type 11 HbA1c 7.7  08/2019 Blood sugar readings within acceptable range today    LOS: 0 Princy Raju 10/19/20212:39 PM  I saw and evaluated the patient and discussed the care with Crosby Oyster, DNP.  I agree with the findings and plan as documented in the note.  Additionally,  dose of prograf may need to be decreased in light of AKI  Murlean Iba , MD [facility]  10/20/20218:34 AM

## 2019-12-05 NOTE — Anesthesia Postprocedure Evaluation (Signed)
Anesthesia Post Note  Patient: Casey [name].  Procedure(s) Performed: TRANSESOPHAGEAL ECHOCARDIOGRAM (TEE) (N/A )     Patient location during evaluation: SICU Anesthesia Type: General Level of consciousness: sedated Pain management: pain level controlled Vital Signs Assessment: post-procedure vital signs reviewed and stable Respiratory status: patient remains intubated per anesthesia plan Cardiovascular status: tachycardic Postop Assessment: no apparent nausea or vomiting Anesthetic complications: no   No complications documented.  Last Vitals:  Vitals:   12/05/19 1524 12/05/19 1739  BP:    Pulse:    Resp:    SpO2: 97% 95%    Last Pain:  Vitals:   12/05/19 1622  PainSc: 0-No pain                 Belenda Cruise P Harveer Sadler

## 2019-12-05 NOTE — Progress Notes (Signed)
Notified Drs. McLean and [employer] of change with pupils (Right 64mm and Lt. 64mm) both unresponsive. No pupillary response noted s/p OR. Code Stroke initiated and Neuro MD w/Rapid Response at bedside.  Will transport to CT and continue to monitor for changes.

## 2019-12-05 NOTE — Anesthesia Procedure Notes (Signed)
Procedure Name: Intubation Date/Time: 12/05/2019 4:45 PM Performed by: Clearnce Sorrel, CRNA Pre-anesthesia Checklist: Patient identified, Emergency Drugs available, Suction available, Patient being monitored and Timeout performed Patient Re-evaluated:Patient Re-evaluated prior to induction Oxygen Delivery Method: Circle system utilized Preoxygenation: Pre-oxygenation with 100% oxygen Induction Type: IV induction Ventilation: Mask ventilation without difficulty and Oral airway inserted - appropriate to patient size Laryngoscope Size: Mac and 4 Grade View: Grade III Tube type: Oral Tube size: 8.0 mm Number of attempts: 1 Airway Equipment and Method: Stylet Placement Confirmation: positive ETCO2 and breath sounds checked- equal and bilateral Secured at: 23 cm Tube secured with: Tape Dental Injury: Teeth and Oropharynx as per pre-operative assessment

## 2019-12-05 NOTE — Progress Notes (Signed)
Report given to [facility] RN at [facility] [phone]. Patient is on Milrinone and heparin drip. Per Dr Cathleen Fears, cardiology orders to continue drip. Md made aware of patients systolic pressure being in the 80's-90's. Patient being tx to room 23. Wife at bedside. EMS has arrived.

## 2019-12-05 NOTE — Consult Note (Addendum)
[facility] Nephrology Consultation Note  Requesting MD: Dr Aundra Dubin Reason for consult: AKI  HPI:  Casey [name]. is a 59 y.o. male with history of DM, HTN, HLD, ESRD status post DDRT in 2011 at [facility], baseline creatinine level around 1.2-1.4 who was admitted on 10/17 at [facility] for STEMI who was transferred to [facility] for acute heart failure seen as a consultation for acute kidney injury. He had late presentation for anterior wall STEMI with troponin more than 27K.  LHC showed severe three-vessel CAD including mid LAD lesion which was treated with stent placement.  Echocardiogram with EF of around 20%.  After cardiac cath the patient had cardiogenic shock requiring milrinone.  He was transferred to [facility] for the management of acute heart failure. For kidney transplant he is on Prograf and prednisone.  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  The creatinine level was 1.46 during admission and increased to 3.10 today.   Unable to assess exact urine output as patient was transferred to this hospital today and went to the OR.  He was seen by nephrologist team at [facility]. Today, the initial the plan was to place Impella however when TEE was performed it showed probable LV apical thrombus therefore Impella LVAD was not placed.   During my evaluation patient just arrived to ICU.  He was tachycardic to 140-150ss and there was concern of unequal pupils.  Stroke team was consulted.   Patient is currently agitated, intubated.  On milrinone and nursing staff is titrating pressors. Unable to obtain detailed review of system.  Creatinine, Ser  Date/Time Value Ref Range Status  12/05/2019 07:25 AM 3.10 (H) 0.61 - 1.24 mg/dL Final  12/04/2019 12:45 PM 1.86 (H) 0.61 - 1.24 mg/dL Final  12/04/2019 12:34 AM 1.53 (H) 0.61 - 1.24 mg/dL Final  12/03/2019 07:56 PM 1.46 (H) 0.61 - 1.24 mg/dL Final  05/19/2015 03:33 PM 2.06 (H) 0.61 - 1.24 mg/dL Final  03/06/2011 09:22 AM  1.12 0.50 - 1.35 mg/dL Final     PMHx:   Past Medical History:  Diagnosis Date  . Chronic kidney disease 04/2009   Kidney Transplant  . Diabetes mellitus   . GERD (gastroesophageal reflux disease)    as needed reflux  . Hypertension     Past Surgical History:  Procedure Laterality Date  . 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 CORONARY/GRAFT ACUTE MI REVASCULARIZATION N/A 12/03/2019   Procedure: Coronary/Graft Acute MI Revascularization;  Surgeon: Wellington Hampshire, MD;  Location: [facility];  Service: Cardiovascular;  Laterality: N/A;  . DIALYSIS FISTULA CREATION     last used 04/2009  . INSERTION OF DIALYSIS CATHETER     cordis dialysis catheter placement  . KIDNEY TRANSPLANT  2011  . LEFT HEART CATH AND CORONARY ANGIOGRAPHY N/A 12/03/2019   Procedure: LEFT HEART CATH AND CORONARY ANGIOGRAPHY;  Surgeon: Wellington Hampshire, MD;  Location: [facility];  Service: Cardiovascular;  Laterality: N/A;    Family Hx:  Family History  Problem Relation Age of Onset  . Diabetes Mother   . Hyperlipidemia Mother   . Cancer Father     Social History:  reports that he has never smoked. He has never used smokeless tobacco. He reports current alcohol use. He reports that he does  not use drugs.  Allergies: No Known Allergies  Medications: Prior to Admission medications   Medication Sig Start Date End Date Taking? Authorizing Provider  aspirin 81 MG chewable tablet Chew 1 tablet (81 mg total) by mouth daily. 12/06/19   Wouk, Ailene Rud, MD  heparin 25000-0.45 UT/250ML-% infusion Inject 1,400 Units/hr into the vein continuous. 12/05/19   Wouk, Ailene Rud, MD  insulin aspart (NOVOLOG) 100 UNIT/ML injection Inject 0-9 Units into the skin every 4 (four) hours. 12/05/19   Wouk, Ailene Rud, MD   milrinone [facility]) 20 MG/100 ML SOLN infusion Inject 0.0231 mg/min into the vein continuous. 12/05/19   Wouk, Ailene Rud, MD  predniSONE (DELTASONE) 5 MG tablet Take 5 mg by mouth Daily. 01/18/11   [provider]  tacrolimus (PROGRAF) 1 MG capsule Take 5 mg by mouth 2 (two) times daily.     [provider]  ticagrelor (BRILINTA) 90 MG TABS tablet Take 1 tablet (90 mg total) by mouth 2 (two) times daily. 12/05/19   Wouk, Ailene Rud, MD    I have reviewed the patient's current medications.  Labs:  Results for orders placed or performed during the hospital encounter of 12/05/19 (from the past 48 hour(s))  Troponin I (High Sensitivity)     Status: Abnormal   Collection Time: 12/05/19  1:42 PM  Result Value Ref Range   Troponin I (High Sensitivity) >27,000 (HH) <18 ng/L    Comment: Performed at [facility] [address], [place], [place] [address]  Lactic acid, plasma     Status: None   Collection Time: 12/05/19  2:53 PM  Result Value Ref Range   Lactic Acid, Venous 1.3 0.5 - 1.9 mmol/L    Comment: Performed at [facility] [address] [place], [place] [address]  POCT I-Stat EG7     Status: Abnormal   Collection Time: 12/05/19  3:48 PM  Result Value Ref Range   pH, Ven 7.382 7.25 - 7.43   pCO2, Ven 33.7 (L) 44 - 60 mmHg   pO2, Ven 34.0 32 - 45 mmHg   Bicarbonate 20.0 20.0 - 28.0 mmol/L   TCO2 21 (L) 22 - 32 mmol/L   O2 Saturation 65.0 %   Acid-base deficit 4.0 (H) 0.0 - 2.0 mmol/L   Sodium 135 135 - 145 mmol/L   Potassium 4.1 3.5 - 5.1 mmol/L   Calcium, Ion 1.23 1.15 - 1.40 mmol/L   HCT 41.0 39 - 52 %   Hemoglobin 13.9 13.0 - 17.0 g/dL   Sample type VENOUS   POCT I-Stat EG7     Status: Abnormal   Collection Time: 12/05/19  3:49 PM  Result Value Ref Range   pH, Ven 7.391 7.25 - 7.43   pCO2, Ven 34.8 (L) 44 - 60 mmHg   pO2, Ven 34.0 32 - 45 mmHg   Bicarbonate 21.1 20.0 - 28.0 mmol/L   TCO2 22 22 - 32 mmol/L   O2 Saturation 65.0 %    Acid-base deficit 3.0 (H) 0.0 - 2.0 mmol/L   Sodium 133 (L) 135 - 145 mmol/L   Potassium 4.3 3.5 - 5.1 mmol/L   Calcium, Ion 1.31 1.15 - 1.40 mmol/L   HCT 42.0 39 - 52 %   Hemoglobin 14.3 13.0 - 17.0 g/dL   Sample type VENOUS   Type and screen     Status: None (Preliminary result)   Collection Time: 12/05/19  4:02 PM  Result Value Ref Range   ABO/RH(D) A  POS    Antibody Screen NEG    Sample Expiration 12/08/2019,2359    Unit Number [id number]    Blood Component Type RED CELLS,LR    Unit division 00    Status of Unit ALLOCATED    Transfusion Status OK TO TRANSFUSE    Crossmatch Result      Compatible Performed at [facility], [address] [place], [place] [address]    Unit Number [id number]    Blood Component Type RED CELLS,LR    Unit division 00    Status of Unit ALLOCATED    Transfusion Status OK TO TRANSFUSE    Crossmatch Result Compatible   Prepare RBC (crossmatch)     Status: None   Collection Time: 12/05/19  4:02 PM  Result Value Ref Range   Order Confirmation      ORDER PROCESSED BY BLOOD BANK Performed at [facility], [place] [address], [place], [place] [address]   I-STAT 7, (LYTES, BLD GAS, ICA, H+H)     Status: Abnormal   Collection Time: 12/05/19  5:14 PM  Result Value Ref Range   pH, Arterial 7.351 7.35 - 7.45   pCO2 arterial 36.6 32 - 48 mmHg   pO2, Arterial 142 (H) 83 - 108 mmHg   Bicarbonate 20.2 20.0 - 28.0 mmol/L   TCO2 21 (L) 22 - 32 mmol/L   O2 Saturation 99.0 %   Acid-base deficit 5.0 (H) 0.0 - 2.0 mmol/L   Sodium 135 135 - 145 mmol/L   Potassium 4.3 3.5 - 5.1 mmol/L   Calcium, Ion 1.28 1.15 - 1.40 mmol/L   HCT 43.0 39 - 52 %   Hemoglobin 14.6 13.0 - 17.0 g/dL   Sample type ARTERIAL      ROS:    Physical Exam: Vitals:   12/05/19 1524 12/05/19 1739  BP:    Pulse:    Resp:    SpO2: 97% 95%     General exam: Agitated, intubated Respiratory system: Coarse breath sound bilateral Cardiovascular system: S1 & S2 heard,  tachycardic.  No pedal edema. Gastrointestinal system: Abdomen is nondistended, soft and nontender. Normal bowel sounds heard. Central nervous system: Confused and agitated. Extremities: No edema or cyanosis Skin: No rashes, lesions or ulcers Psychiatry: Unable to assess  Assessment/Plan:  #Acute kidney injury due to cardiogenic shock/contrast nephropathy in kidney transplant patient: Check urinalysis, pending kidney transplant US Doppler. Creatinine level elevated to 3.1 from 1.4 on admission. Exact urine output unknown today but I don't see much urine the bag. He is just out from the OR and currently unstable with tachycardia, agitation and concern of neurological changes.  Stroke team at bedside. Volume status seems acceptable.  Continue to monitor urine output and lab.  If no improvement he may need renal replacement therapy.  #History of ESRD status post DDRT in 2011: Currently on lower dose of Prograf sublingual.  The Prograf level was sent today from [facility].  He will probably need medication through the feeding tube while on mechanical ventilation.  Continue prednisone.  At the moment his blood pressure is high probably because of agitation.  Hold stress dose steroid today.  #STEMI/cardiogenic shock: On milrinone and pressors. TEE with LV apical thrombus.  Management including anticoagulation per cardiology team.  Maintain MAP >65.  #Acute systolic CHF: EF of 94%.  On milrinone.  Currently the volume status looks acceptable.  Continue to monitor.  #Altered mental status/unequal pupil: Stroke code was called and neurologist at bedside.  Getting  stat CT head.  Thank you for the consult.  We will follow with you.  Addendum 7:02 PM I spoke with the patient's wife over the phone and discussed about worsening kidney function and possible need for dialysis including CRRT.  She agreed for renal replacement therapy when needed.   Cass Vandermeulen Tanna Furry 12/05/2019, 6:14 PM  [facility].

## 2019-12-05 NOTE — Progress Notes (Signed)
ANTICOAGULATION CONSULT NOTE  Pharmacy Consult for heparin Indication: apical thrombus  No Known Allergies  Patient Measurements: Height: 6\' 1"  (185.4 cm) Weight: 94.5 kg (208 lb 5.4 oz) IBW/kg (Calculated) : 79.9 Heparin Dosing Weight: 92 kg  Vital Signs: Temp: 98.3 F (36.8 C) (10/19 0700) Temp Source: Axillary (10/19 0400) BP: 88/59 (10/19 0700) Pulse Rate: 111 (10/19 0700)  Labs: Recent Labs    12/03/19 1956 12/03/19 1956 12/03/19 2233 12/04/19 0034 12/04/19 0035 12/04/19 1245 12/04/19 2140 12/05/19 0725  HGB 17.4*   < >  --   --  15.3  --   --  14.4  HCT 52.1*  --   --   --  46.1  --   --  43.1  PLT 303  --   --   --  314  --   --  316  APTT 34  --   --   --   --   --   --   --   LABPROT 13.8  --   --   --   --   --   --   --   INR 1.1  --   --   --   --   --   --   --   HEPARINUNFRC  --   --   --   --   --   --  0.59 0.42  CREATININE 1.46*  --   --  1.53*  --  1.86*  --   --   CKTOTAL  --   --  2,183*  --   --   --   --   --   TROPONINIHS >27,000*  --  >27,000* >27,000*  --   --   --   --    < > = values in this interval not displayed.    Estimated Creatinine Clearance: 48.3 mL/min (A) (by C-G formula based on SCr of 1.86 mg/dL (H)).   Medical History: Past Medical History:  Diagnosis Date  . Chronic kidney disease 04/2009   Kidney Transplant  . Diabetes mellitus   . GERD (gastroesophageal reflux disease)    as needed reflux  . Hypertension      Assessment: 58 year old male presented with STEMI s/p PCI to mid LAD. Plan for staged revascularization of left circumflex pending stabilization of renal function given kidney transplant. 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 Also with early apical thrombus. Consult for heparin with plan to transition to warfarin after invasive procedures.  1018 2140 HL 0.59  1019 0725 HL 0.42  Goal of Therapy:  Heparin level 0.3-0.7 units/ml Monitor platelets by anticoagulation protocol:  Yes   Plan:  Heparin level is therapeutic. Will continue current rate of heparin at 1400 units/hr. CBC and HL with morning labs.   Tawnya Crook, PharmD 12/05/2019,8:39 AM

## 2019-12-05 NOTE — Treatment Plan (Signed)
Received page from radiology Dr. Nevada Crane, no bleeding in the CT head. Ok to restart heparin IV given his LV thrombus. Relayed to RN and also discussed with RN to monitor and document pupils size.   Rosalin Hawking, MD PhD Stroke Neurology 12/05/2019 7:31 PM

## 2019-12-05 NOTE — Significant Event (Signed)
Rapid Response Event Note   Reason for Call :  Left pupil dilated  Initial Focused Assessment:  Pt returned from CT surgery, primary RN noted pt left pupil to be 60mm, irregular. Right pupil 50mm, non-reactive. LKW 1642. Pt SBP 200, vasopressors stopped. Pt received muscular blockade and sedation during his procedure creating challenges in assessing for neurological deficits. Following receiving reversal agents, he woke up agitated- reaching for lines/tubes. NIH was completed- 3, see flowsheet. Left pupil remained irregular and unequal in size compared to the right. Dr. Erlinda Hong at bedside to evaluate.   Pt now in a-fib RVR and requiring additional interventions to stabilize before being transported to CT.   VS: T 97.31F, BP 104/62, HR 130, RR 24, SpO2 98% on vent 50% FiO2 CBG completed  Interventions:  -Code stroke CT head ordered -Q1H neuro checks including pupils  Event Summary:  MD Notified: Dr. Aundra Dubin Call Time: 7340 Arrival Time: 3709 End Time: 1830 - rapid response called to another emergency  Casimer Bilis, RN

## 2019-12-05 NOTE — Progress Notes (Signed)
Day of Surgery Procedure(s) (LRB): PLACEMENT OF IMPELLA 5.5 LEFT VENTRICULAR ASSIST DEVICE VIA RIGHT AXILLARY ARTERY (N/A) TRANSESOPHAGEAL ECHOCARDIOGRAM (TEE) (N/A) Subjective: Patient with nausea, weakness after STEMI, PCI RHC shows low PA pressures with CO 4.5 Objective: Vital signs in last 24 hours: Temp:  [97.2 F (36.2 C)-99 F (37.2 C)] 97.6 F (36.4 C) (10/19 1200) Pulse Rate:  [102-125] 125 (10/19 1445) Cardiac Rhythm: Sinus tachycardia (10/19 1100) Resp:  [16-55] 42 (10/19 1445) BP: (77-123)/(52-93) 84/61 (10/19 1445) SpO2:  [94 %-100 %] 97 % (10/19 1524) Arterial Line BP: (85-88)/(51-56) 85/56 (10/19 1445) Weight:  [94.5 kg] 94.5 kg (10/19 0330)  Hemodynamic pCO arameters for last 24 hours:  92/60 on Milrinone .25  Intake/Output from previous day: No intake/output data recorded. Intake/Output this shift: No intake/output data recorded.  Sedated in cath lab Abdomen distended soft Lab Results: Recent Labs    12/04/19 0035 12/05/19 0725  WBC 22.9* 24.0*  HGB 15.3 14.4  HCT 46.1 43.1  PLT 314 316   BMET:  Recent Labs    12/04/19 1245 12/05/19 0725  NA 135 132*  K 4.9 4.6  CL 103 99  CO2 21* 19*  GLUCOSE 207* 244*  BUN 30* 53*  CREATININE 1.86* 3.10*  CALCIUM 9.7 9.8    PT/INR:  Recent Labs    12/03/19 1956  LABPROT 13.8  INR 1.1   ABG No results found for: PHART, HCO3, TCO2, ACIDBASEDEF, O2SAT CBG (last 3)  Recent Labs    12/05/19 0328 12/05/19 0732 12/05/19 1122  GLUCAP 201* 235* 285*    Assessment/Plan: S/P Procedure(s) (LRB): PLACEMENT OF IMPELLA 5.5 LEFT VENTRICULAR ASSIST DEVICE VIA RIGHT AXILLARY ARTERY (N/A) TRANSESOPHAGEAL ECHOCARDIOGRAM (TEE) (N/A) STEMI with low cardiac output Ileus with WBC 24k after MI Hx renal transplant with rising creatinine  Patient d/w Dr Aundra Dubin for coordination of care- recommend placement  of Impella to optimize blood flow to end-organs   LOS: 0 days    Casey [name] 12/05/2019

## 2019-12-05 NOTE — Progress Notes (Signed)
Patient transported to CT and back without complications. RN at bedside.  

## 2019-12-05 NOTE — Progress Notes (Signed)
ANTICOAGULATION CONSULT NOTE  Pharmacy Consult for heparin Indication: apical thrombus  No Known Allergies  Patient Measurements:   Heparin Dosing Weight: 92 kg  Vital Signs: Temp: 97.6 F (36.4 C) (10/19 1200) Temp Source: Oral (10/19 1200) BP: 84/61 (10/19 1445) Pulse Rate: 125 (10/19 1445)  Labs: Recent Labs    12/03/19 1956 12/03/19 1956 12/03/19 2233 12/04/19 0034 12/04/19 0035 12/04/19 1245 12/04/19 2140 12/05/19 0725  HGB 17.4*   < >  --   --  15.3  --   --  14.4  HCT 52.1*  --   --   --  46.1  --   --  43.1  PLT 303  --   --   --  314  --   --  316  APTT 34  --   --   --   --   --   --   --   LABPROT 13.8  --   --   --   --   --   --   --   INR 1.1  --   --   --   --   --   --   --   HEPARINUNFRC  --   --   --   --   --   --  0.59 0.42  CREATININE 1.46*   < >  --  1.53*  --  1.86*  --  3.10*  CKTOTAL  --   --  2,183*  --   --   --   --   --   TROPONINIHS >27,000*  --  >27,000* >27,000*  --   --   --   --    < > = values in this interval not displayed.    Estimated Creatinine Clearance: 29 mL/min (A) (by C-G formula based on SCr of 3.1 mg/dL (H)).   Medical History: Past Medical History:  Diagnosis Date  . Chronic kidney disease 04/2009   Kidney Transplant  . Diabetes mellitus   . GERD (gastroesophageal reflux disease)    as needed reflux  . Hypertension      Assessment: 59 year old male presented with STEMI s/p PCI to mid LAD. Pt noted to have apical smoke on ECHO concerning for early thrombus. Pharmacy asked to continue heparin for now upon  transfer from [facility]. Heparin level therapeutic this morning.  Concern for stroke post RHC, head CT negative for bleeding. Heparin to be restarted at prior rate. LV thrombus noted on echo, impella cancelled.   Goal of Therapy:  Heparin level 0.3-0.7 units/ml Monitor platelets by anticoagulation protocol: Yes   Plan:  -restart heparin 1400 units/h -Daily heparin level and CBC   Erin Hearing PharmD.,  BCPS Clinical Pharmacist 12/05/2019 3:16 PM

## 2019-12-05 NOTE — Discharge Summary (Signed)
Casey Lieu Mccree Jr. DJM:[id number] DOB: 12-23-1960 DOA: 12/03/2019  PCP: Dion Body, MD  Admit date: 12/03/2019 Discharge date: 12/05/2019  Time spent: 35 minutes    Discharge Diagnoses:  Active Problems:   Acute ST elevation myocardial infarction (STEMI) due to occlusion of left anterior descending (LAD) coronary artery (HCC)   Type 2 diabetes mellitus with hyperlipidemia (HCC)   Hyperlipidemia   History of renal transplant   CKD (chronic kidney disease), stage III (HCC)   STEMI (ST elevation myocardial infarction) (HCC)   HTN (hypertension)   Acute systolic heart failure (HCC)   Acute kidney injury superimposed on chronic kidney disease ([place])   Discharge Condition: stable  Diet recommendation: heart healthy  Filed Weights   12/03/19 1955 12/05/19 0330  Weight: 92.5 kg 94.5 kg    History of present illness:  From cardiology's initial consult: Casey [name] a 61 year oldAfrican American male with history of kidney transplant in 2011 with stage IIIa chronic kidney disease, type 2 diabetes, hypertension, and hyperlipidemia. He is not a smoker and is not aware of any previous cardiac history. The patient reports that over the last 1 to 2 months he has experienced exertional chest pain and tightness that resolved with rest. He did not inform family his family members or his primary care physician. He had an episode of chest pain and tightness at rest about 1 month ago that lasted about 10 minutes. He started having substernal chest burning and tightness feeling on Friday which continued since then. The patient thought it was due to acid reflux but his symptoms did not improve. Today, he started having chest pain especially with taking a deep breath. He continued to have the same tightness feeling and thus he came to the emergency room for evaluation. An EKG was performed which showed extensive anterior and anteroseptal Q waves with persistent ST elevation. Given his  symptoms and EKG changes, code STEMI was called. At the time of my evaluation, the patient was having 5 out of 10 chest pain. Surprisingly, his vital signs were reasonable including normal blood pressure which if anything was on the high side. He was mildly tachycardic with a heart rate of 110 bpm. Given his symptoms and EKG changes, I recommended proceeding with emergent cardiac catheterization and possible PCI. I discussed this with the patient and his wife who was at the bedside.   Hospital Course:  # STEMI  Chest pain for 1 month prior to presentation. Taken emergently to cath lab given pain and ST elevations. Cath showed multi-vessel disease. One DES placed to mid LAD. Left distal circumflex will also need to be treated but cardiology opted not to intervene 10/17 given dye load and ckd - continue aspirin, brillinta, rosuvastatin - holding ARB, BB as below  # Apical thrombus - cont heparin  # Ileus Suspect early paralytic ileus 2/2 acute illness, as not passing flatus or BMs. No n/v but no appetite. KUB suggestive of ileus - NPO, monitor  # Acute systolic heart failure 2/2 ischemia. EF 20-25%. Does not appear fluid overloaded. Hypotensive with start of carvedilol yesterday, uop low 275 yesterday. Started on milrinone 10/18. Soft pressures this morning, MAPs in the 70s. Cardiology discussed with team at [facility], transferring there for advanced heart failure mgmt, possible RHC, hemodynamic support, etc. - cont milrinone - may need CV access for pressure support, monitoring, etc. - holding arb and bb  # AKI on CKD stage 3 Status post deceased donor kidney transplant. Dye load with cath. Baseline cr around  1.3, a.5 on admission, today to 3.1, oliguric.  - cont NS @ 50 - possible pressor support as above - maintain Foley - cont home tacrolimus and prednisone - nephrology following  # T2DM A1c 7.7 08/2019. Here sugars upper 100s - hold home glipizide, start SSI - lantus 5  qhs - consider eventual transition to sglt2i given ckd, CHF  # HTN Here bps wnl - holding home clonidine and amlodipine and labetalol  # Transaminitis LFTS wnl earlier this year at [facility], here LFTs mildly elevated, improving - trend, further w/u if elevation persists  Procedures:  Left heart cath 10/17   Consultations:  Cardiology, nephrology  Discharge Exam: Vitals:   12/05/19 0800 12/05/19 0900  BP: (!) 91/52 94/64  Pulse: (!) 121 (!) 121  Resp: 18 (!) 24  Temp:    SpO2: 96% 95%    General exam: Appears calm and comfortable  Respiratory system: Clear to auscultation. Respiratory effort normal. Cardiovascular system: S1 & S2 heard, mild systolic murmur, mild tachycardia Gastrointestinal system: Abdomen is distended, decreased BSs inferiorally, non tender Central nervous system: Alert and oriented. No focal neurological deficits. Extremities: Symmetric 5 x 5 power. Skin: No rashes, lesions or ulcers Psychiatry: Judgement and insight appear normal. Mood & affect appropriate.   Discharge Instructions   Discharge Instructions    AMB Referral to Cardiac Rehabilitation - Phase II   Complete by: As directed    Diagnosis:  STEMI Coronary Stents     After initial evaluation and assessments completed: Virtual Based Care may be provided alone or in conjunction with Phase 2 Cardiac Rehab based on patient barriers.: Yes   Diet - low sodium heart healthy   Complete by: As directed    Increase activity slowly   Complete by: As directed      Allergies as of 12/05/2019   No Known Allergies     Medication List    STOP taking these medications   amLODipine 5 MG tablet Commonly known as: NORVASC   cetirizine 5 MG tablet Commonly known as: ZYRTEC   Cholecalciferol 25 MCG (1000 UT) tablet   cloNIDine 0.1 MG tablet Commonly known as: CATAPRES   glipiZIDE 10 MG 24 hr tablet Commonly known as: GLUCOTROL XL   labetalol 300 MG tablet Commonly known as: NORMODYNE    lisinopril 20 MG tablet Commonly known as: ZESTRIL   rosuvastatin 20 MG tablet Commonly known as: CRESTOR     TAKE these medications   aspirin 81 MG chewable tablet Chew 1 tablet (81 mg total) by mouth daily. Start taking on: December 06, 2019   heparin 25000-0.45 UT/250ML-% infusion Inject 1,400 Units/hr into the vein continuous.   insulin aspart 100 UNIT/ML injection Commonly known as: novoLOG Inject 0-9 Units into the skin every 4 (four) hours.   milrinone 20 MG/100 ML Soln infusion Commonly known as: PRIMACOR Inject 0.0231 mg/min into the vein continuous.   predniSONE 5 MG tablet Commonly known as: DELTASONE Take 5 mg by mouth Daily.   tacrolimus 1 MG capsule Commonly known as: PROGRAF Take 5 mg by mouth 2 (two) times daily.   ticagrelor 90 MG Tabs tablet Commonly known as: BRILINTA Take 1 tablet (90 mg total) by mouth 2 (two) times daily.      No Known Allergies    The results of significant diagnostics from this hospitalization (including imaging, microbiology, ancillary and laboratory) are listed below for reference.    Significant Diagnostic Studies: DG Abd 1 View  Result Date: 12/05/2019 CLINICAL DATA:  Abdominal distension.  Admitted with STEMI. EXAM: ABDOMEN - 1 VIEW COMPARISON:  05/19/2015 FINDINGS: Diffuse gas dilated bowel primarily affecting stomach and small bowel with gas seen to the level of the ileum. Proximal colon is distended with remaining colon not well visualized. No rectal impaction or colonic volvulus appearance. No concerning mass effect or evidence of extraluminal gas, although supine. IMPRESSION: Diffuse gas dilated bowel. Although the distal colon is less affected, ileus is still favored. Electronically Signed   By: Monte Fantasia M.D.   On: 12/05/2019 09:32   CARDIAC CATHETERIZATION  Result Date: 12/03/2019  There is severe left ventricular systolic dysfunction.  LV end diastolic pressure is moderately elevated.  The left  ventricular ejection fraction is less than 25% by visual estimate.  Mid Cx to Dist Cx lesion is 95% stenosed.  3rd Mrg lesion is 80% stenosed.  A drug-eluting stent was successfully placed using a STENT RESOLUTE ONYX 2.5X15.  Prox Cx lesion is 30% stenosed.  Mid RCA to Dist RCA lesion is 99% stenosed.  Mid LAD-1 lesion is 100% stenosed.  Post intervention, there is a 0% residual stenosis.  Mid LAD-2 lesion is 40% stenosed.  Prox LAD lesion is 30% stenosed.  1.  Late presenting anterior ST elevation myocardial infarction.  The culprit is an occluded mid LAD.  However, the patient also has severe three-vessel coronary artery disease.  There is also significant myocardial bridge in the distal LAD. 2.  Severely reduced LV systolic function with an EF of 15% with mid to distal anterior, apical and distal inferior akinesis.  No evidence of LV thrombus. 3.  High normal systolic blood pressure and moderately elevated left ventricular end-diastolic pressure at 24 mmHg. 3.  Successful angioplasty and drug-eluting stent placement to the mid LAD. Recommendations: In spite of late presentation and significant underlying three-vessel coronary artery disease, the patient was not in cardiogenic shock.  I elected to revascularize the LAD in spite of late presentation given continued chest pain and EKG changes.  The patient reported resolution of chest pain by the end of the procedure. He continues to have significant disease involving the RCA and left circumflex.  The whole distal RCA is diffusely diseased and not revascularizable.  The left circumflex can be treated with staged PCI before hospital discharge.  I elected not to do that today given underlying chronic kidney disease and kidney transplant status. Continue dual antiplatelet therapy for at least 1 year. Aggressive treatment of risk factors. We will avoid hydration given significant reduced EF and at the same time will only diuresis if needed based on his  respiratory status to decrease the chance of contrast-induced nephropathy.   DG Chest Portable 1 View  Result Date: 12/03/2019 CLINICAL DATA:  Chest pain EXAM: PORTABLE CHEST 1 VIEW COMPARISON:  03/06/2011 FINDINGS: Low lung volumes. Heart and mediastinal contours are within normal limits. No focal opacities or effusions. No acute bony abnormality. IMPRESSION: No active disease. Electronically Signed   By: Rolm Baptise M.D.   On: 12/03/2019 20:30   ECHOCARDIOGRAM COMPLETE  Result Date: 12/04/2019    ECHOCARDIOGRAM REPORT   Patient Name:   Casey [name]. Date of Exam: 12/04/2019 Medical Rec #:  [id number]           Height:       73.0 in Accession #:    [id number]          Weight:       203.9 lb Date of Birth:  [birth year]  BSA:          2.169 m Patient Age:    39 years            BP:           92/75 mmHg Patient Gender: M                   HR:           90 bpm. Exam Location:  [facility] Procedure: 2D Echo, Color Doppler, Cardiac Doppler and Intracardiac            Opacification Agent Indications:     Acute myocardial infarction  History:         Patient has no prior history of Echocardiogram examinations.                  CKD; Risk Factors:Hypertension and Diabetes.  Sonographer:     Charmayne Sheer RDCS (AE) Referring Phys:  [place] Diagnosing Phys: Kathlyn Sacramento MD  Sonographer Comments: No subcostal window. IMPRESSIONS  1. Echo contrast was given. There is heavy smoke in the apex with possible early thrombus formation. . Left ventricular ejection fraction, by estimation, is 20 to 25%. The left ventricle has severely decreased function. The left ventricle demonstrates regional wall motion abnormalities (see scoring diagram/findings for description). The left ventricular internal cavity size was mildly dilated. There is mild left ventricular hypertrophy. Left ventricular diastolic parameters are consistent with Grade I  diastolic dysfunction (impaired relaxation). There is akinesis of the  left ventricular, mid-apical anteroseptal wall, anterolateral wall and anterior segment. There is dyskinesis of the left ventricular, apical segment.  2. Right ventricular systolic function is normal. The right ventricular size is normal.  3. The mitral valve is normal in structure. No evidence of mitral valve regurgitation. No evidence of mitral stenosis.  4. The aortic valve is normal in structure. Aortic valve regurgitation is not visualized. Mild aortic valve sclerosis is present, with no evidence of aortic valve stenosis.  5. The inferior vena cava is normal in size with greater than 50% respiratory variability, suggesting right atrial pressure of 3 mmHg. FINDINGS  Left Ventricle: Echo contrast was given. There is heavy smoke in the apex with possible early thrombus formation. Left ventricular ejection fraction, by estimation, is 20 to 25%. The left ventricle has severely decreased function. The left ventricle demonstrates regional wall motion abnormalities. Definity contrast agent was given IV to delineate the left ventricular endocardial borders. The left ventricular internal cavity size was mildly dilated. There is mild left ventricular hypertrophy. Left ventricular diastolic parameters are consistent with Grade I diastolic dysfunction (impaired relaxation). Right Ventricle: The right ventricular size is normal. No increase in right ventricular wall thickness. Right ventricular systolic function is normal. Left Atrium: Left atrial size was normal in size. Right Atrium: Right atrial size was normal in size. Pericardium: There is no evidence of pericardial effusion. Mitral Valve: The mitral valve is normal in structure. There is mild calcification of the mitral valve leaflet(s). Mild mitral annular calcification. No evidence of mitral valve regurgitation. No evidence of mitral valve stenosis. Tricuspid Valve: The tricuspid valve is normal in structure. Tricuspid valve regurgitation is not demonstrated. No  evidence of tricuspid stenosis. Aortic Valve: The aortic valve is normal in structure. Aortic valve regurgitation is not visualized. Mild aortic valve sclerosis is present, with no evidence of aortic valve stenosis. Aortic valve mean gradient measures 3.0 mmHg. Aortic valve peak gradient measures 5.9 mmHg. Aortic valve  area, by VTI measures 1.97 cm. Pulmonic Valve: The pulmonic valve was normal in structure. Pulmonic valve regurgitation is not visualized. No evidence of pulmonic stenosis. Aorta: The aortic root is normal in size and structure. Venous: The inferior vena cava is normal in size with greater than 50% respiratory variability, suggesting right atrial pressure of 3 mmHg. IAS/Shunts: No atrial level shunt detected by color flow Doppler.  LEFT VENTRICLE PLAX 2D LVIDd:         4.27 cm  Diastology LVIDs:         3.51 cm  LV e' medial:    3.92 cm/s LV PW:         1.38 cm  LV E/e' medial:  12.9 LV IVS:        0.98 cm  LV e' lateral:   4.68 cm/s LVOT diam:     1.80 cm  LV E/e' lateral: 10.8 LV SV:         31 LV SV Index:   14 LVOT Area:     2.54 cm  RIGHT VENTRICLE RV Basal diam:  2.35 cm LEFT ATRIUM             Index       RIGHT ATRIUM          Index LA diam:        2.80 cm 1.29 cm/m  RA Area:     9.31 cm LA Vol (A2C):   18.6 ml 8.57 ml/m  RA Volume:   15.80 ml 7.28 ml/m LA Vol (A4C):   29.8 ml 13.74 ml/m LA Biplane Vol: 25.0 ml 11.52 ml/m  AORTIC VALVE                   PULMONIC VALVE AV Area (Vmax):    1.72 cm    PV Vmax:       1.00 m/s AV Area (Vmean):   1.76 cm    PV Vmean:      68.800 cm/s AV Area (VTI):     1.97 cm    PV VTI:        0.136 m AV Vmax:           121.00 cm/s PV Peak grad:  4.0 mmHg AV Vmean:          83.200 cm/s PV Mean grad:  2.0 mmHg AV VTI:            0.155 m AV Peak Grad:      5.9 mmHg AV Mean Grad:      3.0 mmHg LVOT Vmax:         81.60 cm/s LVOT Vmean:        57.600 cm/s LVOT VTI:          0.120 m LVOT/AV VTI ratio: 0.77  AORTA Ao Root diam: 2.90 cm MITRAL VALVE MV Area (PHT):  5.20 cm    SHUNTS MV Decel Time: 146 msec    Systemic VTI:  0.12 m MV E velocity: 50.60 cm/s  Systemic Diam: 1.80 cm MV A velocity: 63.80 cm/s MV E/A ratio:  0.79 Kathlyn Sacramento MD Electronically signed by Kathlyn Sacramento MD Signature Date/Time: 12/04/2019/12:50:37 PM    Final     Microbiology: Recent Results (from the past 240 hour(s))  Respiratory Panel by RT PCR (Flu A&B, Covid) - Nasopharyngeal Swab     Status: None   Collection Time: 12/03/19  7:56 PM   Specimen: Nasopharyngeal Swab  Result Value Ref Range Status   SARS  Coronavirus 2 by RT PCR NEGATIVE NEGATIVE Final    Comment: (NOTE) SARS-CoV-2 target nucleic acids are NOT DETECTED.  The SARS-CoV-2 RNA is generally detectable in upper respiratoy specimens during the acute phase of infection. The lowest concentration of SARS-CoV-2 viral copies this assay can detect is 131 copies/mL. A negative result does not preclude SARS-Cov-2 infection and should not be used as the sole basis for treatment or other patient management decisions. A negative result may occur with  improper specimen collection/handling, submission of specimen other than nasopharyngeal swab, presence of viral mutation(s) within the areas targeted by this assay, and inadequate number of viral copies (<131 copies/mL). A negative result must be combined with clinical observations, patient history, and epidemiological information. The expected result is Negative.  Fact Sheet for Patients:  PinkCheek.be  Fact Sheet for Healthcare Providers:  GravelBags.it  This test is no t yet approved or cleared by the [place] FDA and  has been authorized for detection and/or diagnosis of SARS-CoV-2 by FDA under an Emergency Use Authorization (EUA). This EUA will remain  in effect (meaning this test can be used) for the duration of the COVID-19 declaration under Section 564(b)(1) of the Act, 21 U.S.C. section  360bbb-3(b)(1), unless the authorization is terminated or revoked sooner.     Influenza A by PCR NEGATIVE NEGATIVE Final   Influenza B by PCR NEGATIVE NEGATIVE Final    Comment: (NOTE) The Xpert Xpress SARS-CoV-2/FLU/RSV assay is intended as an aid in  the diagnosis of influenza from Nasopharyngeal swab specimens and  should not be used as a sole basis for treatment. Nasal washings and  aspirates are unacceptable for Xpert Xpress SARS-CoV-2/FLU/RSV  testing.  Fact Sheet for Patients: PinkCheek.be  Fact Sheet for Healthcare Providers: GravelBags.it  This test is not yet approved or cleared by the [place] FDA and  has been authorized for detection and/or diagnosis of SARS-CoV-2 by  FDA under an Emergency Use Authorization (EUA). This EUA will remain  in effect (meaning this test can be used) for the duration of the  Covid-19 declaration under Section 564(b)(1) of the Act, 21  U.S.C. section 360bbb-3(b)(1), unless the authorization is  terminated or revoked. Performed at [facility], [address] [place], [place] [address]   MRSA PCR Screening     Status: None   Collection Time: 12/04/19 12:03 AM   Specimen: Nasopharyngeal  Result Value Ref Range Status   MRSA by PCR NEGATIVE NEGATIVE Final    Comment:        The GeneXpert MRSA Assay (FDA approved for NASAL specimens only), is one component of a comprehensive MRSA colonization surveillance program. It is not intended to diagnose MRSA infection nor to guide or monitor treatment for MRSA infections. Performed at [facility], [address] [place], [place] [address]      Labs: Basic Metabolic Panel: Recent Labs  Lab 12/03/19 1956 12/04/19 0034 12/04/19 1245 12/05/19 0725  NA 136 134* 135 132*  K 4.7 4.8 4.9 4.6  CL 100 102 103 99  CO2 25 22 21* 19*  GLUCOSE 221* 228* 207* 244*  BUN 18 20 30* 53*  CREATININE 1.46* 1.53*  1.86* 3.10*  CALCIUM 10.3 9.5 9.7 9.8  MG  --  2.1  --   --   PHOS  --  2.9  --   --    Liver Function Tests: Recent Labs  Lab 12/03/19 1956 12/04/19 0034 12/05/19 0725  AST 267* 245* 79*  ALT 82*  67* 45*  ALKPHOS 89 75 78  BILITOT 1.4* 1.2 1.2  PROT 8.5* 6.7 7.1  ALBUMIN 4.4 3.6 3.2*   No results for input(s): LIPASE, AMYLASE in the last 168 hours. No results for input(s): AMMONIA in the last 168 hours. CBC: Recent Labs  Lab 12/03/19 1956 12/04/19 0035 12/05/19 0725  WBC 23.9* 22.9* 24.0*  NEUTROABS  --  18.5*  --   HGB 17.4* 15.3 14.4  HCT 52.1* 46.1 43.1  MCV 84.2 83.4 83.0  PLT 303 314 316   Cardiac Enzymes: Recent Labs  Lab 12/03/19 2233  CKTOTAL 2,183*   BNP: BNP (last 3 results) No results for input(s): BNP in the last 8760 hours.  ProBNP (last 3 results) No results for input(s): PROBNP in the last 8760 hours.  CBG: Recent Labs  Lab 12/04/19 1627 12/04/19 2012 12/04/19 2321 12/05/19 0328 12/05/19 0732  GLUCAP 190* 190* 198* 201* 235*       Signed:  Desma Maxim MD.  [facility] 12/05/2019, 10:25 AM

## 2019-12-05 NOTE — Progress Notes (Addendum)
Pharmacy Antibiotic Note  Casey [name]. is a 59 y.o. male admitted to [facility] on 10/17 with late stemi presentation. Patient now with believed sepsis.    Patient with tmax of 99, wbc up to 24, aki with scr up to 3.1. Orders to start zosyn empirically. Zinacef given in OR this afternoon, will start zosyn tonight.   Plan: Zosyn 3.375g IV q8 hours - infuse each dose over 4 hours     Temp (24hrs), Avg:98.2 F (36.8 C), Min:97.6 F (36.4 C), Max:99 F (37.2 C)  Recent Labs  Lab 12/03/19 1956 12/04/19 0034 12/04/19 0035 12/04/19 1245 12/05/19 0725 12/05/19 1453  WBC 23.9*  --  22.9*  --  24.0*  --   CREATININE 1.46* 1.53*  --  1.86* 3.10*  --   LATICACIDVEN  --   --   --   --   --  1.3    Estimated Creatinine Clearance: 29 mL/min (A) (by C-G formula based on SCr of 3.1 mg/dL (H)).    No Known Allergies  Thank you for allowing pharmacy to be a part of this patient's care.  Erin Hearing PharmD., BCPS Clinical Pharmacist 12/05/2019 6:11 PM

## 2019-12-05 NOTE — Consult Note (Addendum)
Stroke Neurology Consultation Note  Consult Requested by: Dr. Aundra Dubin  Reason for Consult: unequal pupils  Consult Date: 12/05/19   The history was obtained from the RN.  During history and examination, all items were able to obtain unless otherwise noted.  History of Present Illness:  Casey Linden. is a 59 y.o. African American male with PMH of renal transplant in 2011 at [place], HTN, T2DM and HLD was admitted at [facility] for STEMI s/p PCI and DES, CHF with EF 15-20%. Post cath developed cardiogenic shock with hypotension and AKI. Was put on milrinone drip, and transferred to [facility]. On arrival found to have ileus, s/p NG tube suctioning. He was put on heparin IV which was on hold before went to cath lab then OR for TEE and plan for LVED placement, however, during TEE, he was found to have LV thrombus. No LVED placed. He came back to room, still under anesthesia, he was found to have unequal pupils, left 15mm and right 91mm, not reactive to light. Code stroke called. He was last seen following commands at 16:42pm but did not check pupils.  With reversal of the muscular blockade and sedation, pt woke up and moving all extremities and following simple commands. Still intubated but gaging on the tube. Left pupil 57mm and right 1.73mm, not reactive to light. BP 180s and HR 140s. Put back on sedation and will do stat CT.   LSN: 16:42 following commands but did not check pupils tPA Given: No: recent STEMI with LV thrombus, nondisabling symptoms, recent heparin IV  Past Medical History:  Diagnosis Date  . Chronic kidney disease 04/2009   Kidney Transplant  . Diabetes mellitus   . GERD (gastroesophageal reflux disease)    as needed reflux  . Hypertension     Past Surgical History:  Procedure Laterality Date  . 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 CORONARY/GRAFT ACUTE MI REVASCULARIZATION N/A 12/03/2019   Procedure: Coronary/Graft Acute MI Revascularization;  Surgeon: Wellington Hampshire, MD;  Location: [place] [facility];  Service: Cardiovascular;  Laterality: N/A;  . DIALYSIS FISTULA CREATION     last used 04/2009  . INSERTION OF DIALYSIS CATHETER     cordis dialysis catheter placement  . KIDNEY TRANSPLANT  2011  . LEFT HEART CATH AND CORONARY ANGIOGRAPHY N/A 12/03/2019   Procedure: LEFT HEART CATH AND CORONARY ANGIOGRAPHY;  Surgeon: Wellington Hampshire, MD;  Location: [place] [facility];  Service: Cardiovascular;  Laterality: N/A;    Family History  Problem Relation Age of Onset  . Diabetes Mother   . Hyperlipidemia Mother   . Cancer Father     Social History:  reports that he has never smoked. He has never used smokeless tobacco. He reports current alcohol use. He reports that he does not use drugs.  Allergies: No Known Allergies  No current facility-administered medications on file prior to encounter.   Current Outpatient Medications on File Prior to Encounter  Medication Sig Dispense Refill  . [START ON 12/06/2019] aspirin 81 MG chewable tablet Chew 1 tablet (81 mg total) by mouth daily.    . heparin 25000-0.45 UT/250ML-% infusion Inject 1,400 Units/hr into the vein continuous.    . insulin aspart (NOVOLOG) 100 UNIT/ML injection Inject 0-9 Units into the skin every 4 (four) hours. 10 mL  11  . milrinone (PRIMACOR) 20 MG/100 ML SOLN infusion Inject 0.0231 mg/min into the vein continuous.    . predniSONE (DELTASONE) 5 MG tablet Take 5 mg by mouth Daily.    . tacrolimus (PROGRAF) 1 MG capsule Take 5 mg by mouth 2 (two) times daily.     . ticagrelor (BRILINTA) 90 MG TABS tablet Take 1 tablet (90 mg total) by mouth 2 (two) times daily. 60 tablet     Review of Systems: A full ROS was attempted today and was not able to be performed as pt intubated and sedated.  Physical Examination: Temp:  [97.6  F (36.4 C)-99 F (37.2 C)] 97.6 F (36.4 C) (10/19 1200) Pulse Rate:  [106-125] 125 (10/19 1445) Resp:  [16-55] 42 (10/19 1445) BP: (77-123)/(52-83) 84/61 (10/19 1445) SpO2:  [94 %-99 %] 95 % (10/19 1739) Arterial Line BP: (85-88)/(51-56) 85/56 (10/19 1445) FiO2 (%):  [50 %] 50 % (10/19 1739) Weight:  [94.5 kg] 94.5 kg (10/19 0330)   Temp:  [97.6 F (36.4 C)-99 F (37.2 C)] 97.6 F (36.4 C) (10/19 1200) Pulse Rate:  [106-125] 125 (10/19 1445) Resp:  [16-55] 42 (10/19 1445) BP: (77-123)/(52-83) 84/61 (10/19 1445) SpO2:  [94 %-99 %] 95 % (10/19 1739) Arterial Line BP: (85-88)/(51-56) 85/56 (10/19 1445) FiO2 (%):  [50 %] 50 % (10/19 1739) Weight:  [94.5 kg] 94.5 kg (10/19 0330)  General - Well nourished, well developed, intubated off sedation and sedation reversed.  Ophthalmologic - fundi not visualized due to noncooperation. Left pupil 65mm and right pupil 1.37mm, both not reactive to light  Cardiovascular - tachycardia 140s.  Neuro - intubated off sedation, eyes open, following all simple commands. Eyes moving bilaterally on commands, tracking bilaterally, blinking to visual threat bilaterally, left pupil 2mm and right 1.55mm both not reactive to light. No ptosis or eyes disconjugate. gag and cough present. Facial symmetry not able to test due to ET tube.  Tongue protrusion midline. BUEs lift up without drift within 10 sec. BLEs able to put on knee flexion and foot on bed position without drift, symmetrical. DTR 1+ and no babinski. Sensation, coordination and gait not tested.   NIH Stroke Scale  Level Of Consciousness 0=Alert; keenly responsive 1=Arouse to minor stimulation 2=Requires repeated stimulation to arouse or movements to pain 3=postures or unresponsive 0  LOC Questions to Month and Age 46=Answers both questions correctly 1=Answers one question correctly or dysarthria/intubated/trauma/language barrier 2=Answers neither question correctly or aphasia 1 with ET tube  LOC  Commands      -Open/Close eyes     -Open/close grip     -Pantomime commands if communication barrier 0=Performs both tasks correctly 1=Performs one task correctly 2=Performs neighter task correctly 0  Best Gaze     -Only assess horizontal gaze 0=Normal 1=Partial gaze palsy 2=Forced deviation, or total gaze paresis 0  Visual 0=No visual loss 1=Partial hemianopia 2=Complete hemianopia 3=Bilateral hemianopia (blind including cortical blindness) 0  Facial Palsy     -Use grimace if obtunded 0=Normal symmetrical movement 1=Minor paralysis (asymmetry) 2=Partial paralysis (lower face) 3=Complete paralysis (upper and lower face) 0  Motor  0=No drift for 10/5 seconds 1=Drift, but does not hit bed 2=Some antigravity effort, hits  bed 3=No effort against gravity, limb falls 4=No movement 0=Amputation/joint fusion Right Arm 0     Leg 1    Left Arm 0     Leg 1  Limb Ataxia     - FNT/HTS 0=Absent or does not understand or paralyzed or amputation/joint  fusion 1=Present in one limb 2=Present in two limbs 0  Sensory 0=Normal 1=Mild to moderate sensory loss 2=Severe to total sensory loss or coma/unresponsive 0  Best Language 0=No aphasia, normal 1=Mild to moderate aphasia 2=Severe aphasia 3=Mute, global aphasia, or coma/unresponsive ET tube did not check writing though  Dysarthria 0=Normal 1=Mild to moderate 2=Severe, unintelligible or mute/anarthric 0=intubated/unable to test UN due to ET tube  Extinction/Neglect 0=No abnormality 1=visual/tactile/auditory/spatia/personal inattention/Extinction to bilateral simultaneous stimulation 2=Profound neglect/extinction more than 1 modality  0  Total   3     Data Reviewed: DG Abd 1 View  Result Date: 12/05/2019 CLINICAL DATA:  Abdominal distension.  Admitted with STEMI. EXAM: ABDOMEN - 1 VIEW COMPARISON:  05/19/2015 FINDINGS: Diffuse gas dilated bowel primarily affecting stomach and small bowel with gas seen to the level of the ileum.  Proximal colon is distended with remaining colon not well visualized. No rectal impaction or colonic volvulus appearance. No concerning mass effect or evidence of extraluminal gas, although supine. IMPRESSION: Diffuse gas dilated bowel. Although the distal colon is less affected, ileus is still favored. Electronically Signed   By: Monte Fantasia M.D.   On: 12/05/2019 09:32   CARDIAC CATHETERIZATION  Result Date: 12/05/2019 1. Filling pressures normal to low. 2. Cardiac output is low at 2.06 by Fick and thermodilution on milrinone 0.25 mcg/kg/min. Discussed findings with Dr. Prescott Gum.  Patient is tachycardic and mildly hypotension with SBP upper 80s/90s on milrinone 0.25 mcg/kg/min.  While his cardiac output on milrinone is not markedly low, his hemodynamics are marginal.  He is on inotrope now s/p large MI with residual severe coronary disease.  We decided that the best plan for him would be Impella 5.5 to allow unloading of LV and to limit the need for inotropes/pressors. He has an ileus, will cover with Zosyn IV for now and will get CT abdomen/pelvis at some point post-Impella.   CARDIAC CATHETERIZATION  Result Date: 12/03/2019  There is severe left ventricular systolic dysfunction.  LV end diastolic pressure is moderately elevated.  The left ventricular ejection fraction is less than 25% by visual estimate.  Mid Cx to Dist Cx lesion is 95% stenosed.  3rd Mrg lesion is 80% stenosed.  A drug-eluting stent was successfully placed using a STENT RESOLUTE ONYX 2.5X15.  Prox Cx lesion is 30% stenosed.  Mid RCA to Dist RCA lesion is 99% stenosed.  Mid LAD-1 lesion is 100% stenosed.  Post intervention, there is a 0% residual stenosis.  Mid LAD-2 lesion is 40% stenosed.  Prox LAD lesion is 30% stenosed.  1.  Late presenting anterior ST elevation myocardial infarction.  The culprit is an occluded mid LAD.  However, the patient also has severe three-vessel coronary artery disease.  There is also  significant myocardial bridge in the distal LAD. 2.  Severely reduced LV systolic function with an EF of 15% with mid to distal anterior, apical and distal inferior akinesis.  No evidence of LV thrombus. 3.  High normal systolic blood pressure and moderately elevated left ventricular end-diastolic pressure at 24 mmHg. 3.  Successful angioplasty and drug-eluting stent placement to the mid LAD. Recommendations: In spite of late presentation and significant underlying three-vessel coronary artery disease, the patient was not in cardiogenic shock.  I elected to revascularize the LAD in spite of late presentation given continued chest pain and EKG changes.  The patient reported resolution of chest pain by the end of the procedure. He continues to have significant disease involving the RCA and  left circumflex.  The whole distal RCA is diffusely diseased and not revascularizable.  The left circumflex can be treated with staged PCI before hospital discharge.  I elected not to do that today given underlying chronic kidney disease and kidney transplant status. Continue dual antiplatelet therapy for at least 1 year. Aggressive treatment of risk factors. We will avoid hydration given significant reduced EF and at the same time will only diuresis if needed based on his respiratory status to decrease the chance of contrast-induced nephropathy.   DG Chest Portable 1 View  Result Date: 12/03/2019 CLINICAL DATA:  Chest pain EXAM: PORTABLE CHEST 1 VIEW COMPARISON:  03/06/2011 FINDINGS: Low lung volumes. Heart and mediastinal contours are within normal limits. No focal opacities or effusions. No acute bony abnormality. IMPRESSION: No active disease. Electronically Signed   By: Rolm Baptise M.D.   On: 12/03/2019 20:30   ECHOCARDIOGRAM COMPLETE  Result Date: 12/04/2019    ECHOCARDIOGRAM REPORT   Patient Name:   Casey Lamp. Date of Exam: 12/04/2019 Medical Rec #:  [id number]           Height:       73.0 in Accession #:     [id number]          Weight:       203.9 lb Date of Birth:  1960-07-20            BSA:          2.169 m Patient Age:    16 years            BP:           92/75 mmHg Patient Gender: M                   HR:           90 bpm. Exam Location:  [facility] Procedure: 2D Echo, Color Doppler, Cardiac Doppler and Intracardiac            Opacification Agent Indications:     Acute myocardial infarction  History:         Patient has no prior history of Echocardiogram examinations.                  CKD; Risk Factors:Hypertension and Diabetes.  Sonographer:     Charmayne Sheer RDCS (AE) Referring Phys:  Barrera Diagnosing Phys: Kathlyn Sacramento MD  Sonographer Comments: No subcostal window. IMPRESSIONS  1. Echo contrast was given. There is heavy smoke in the apex with possible early thrombus formation. . Left ventricular ejection fraction, by estimation, is 20 to 25%. The left ventricle has severely decreased function. The left ventricle demonstrates regional wall motion abnormalities (see scoring diagram/findings for description). The left ventricular internal cavity size was mildly dilated. There is mild left ventricular hypertrophy. Left ventricular diastolic parameters are consistent with Grade I  diastolic dysfunction (impaired relaxation). There is akinesis of the left ventricular, mid-apical anteroseptal wall, anterolateral wall and anterior segment. There is dyskinesis of the left ventricular, apical segment.  2. Right ventricular systolic function is normal. The right ventricular size is normal.  3. The mitral valve is normal in structure. No evidence of mitral valve regurgitation. No evidence of mitral stenosis.  4. The aortic valve is normal in structure. Aortic valve regurgitation is not visualized. Mild aortic valve sclerosis is present, with no evidence of aortic valve stenosis.  5. The inferior vena cava is normal in size with greater than 50%  respiratory variability, suggesting right atrial pressure of 3 mmHg.  FINDINGS  Left Ventricle: Echo contrast was given. There is heavy smoke in the apex with possible early thrombus formation. Left ventricular ejection fraction, by estimation, is 20 to 25%. The left ventricle has severely decreased function. The left ventricle demonstrates regional wall motion abnormalities. Definity contrast agent was given IV to delineate the left ventricular endocardial borders. The left ventricular internal cavity size was mildly dilated. There is mild left ventricular hypertrophy. Left ventricular diastolic parameters are consistent with Grade I diastolic dysfunction (impaired relaxation). Right Ventricle: The right ventricular size is normal. No increase in right ventricular wall thickness. Right ventricular systolic function is normal. Left Atrium: Left atrial size was normal in size. Right Atrium: Right atrial size was normal in size. Pericardium: There is no evidence of pericardial effusion. Mitral Valve: The mitral valve is normal in structure. There is mild calcification of the mitral valve leaflet(s). Mild mitral annular calcification. No evidence of mitral valve regurgitation. No evidence of mitral valve stenosis. Tricuspid Valve: The tricuspid valve is normal in structure. Tricuspid valve regurgitation is not demonstrated. No evidence of tricuspid stenosis. Aortic Valve: The aortic valve is normal in structure. Aortic valve regurgitation is not visualized. Mild aortic valve sclerosis is present, with no evidence of aortic valve stenosis. Aortic valve mean gradient measures 3.0 mmHg. Aortic valve peak gradient measures 5.9 mmHg. Aortic valve area, by VTI measures 1.97 cm. Pulmonic Valve: The pulmonic valve was normal in structure. Pulmonic valve regurgitation is not visualized. No evidence of pulmonic stenosis. Aorta: The aortic root is normal in size and structure. Venous: The inferior vena cava is normal in size with greater than 50% respiratory variability, suggesting right atrial  pressure of 3 mmHg. IAS/Shunts: No atrial level shunt detected by color flow Doppler.  LEFT VENTRICLE PLAX 2D LVIDd:         4.27 cm  Diastology LVIDs:         3.51 cm  LV e' medial:    3.92 cm/s LV PW:         1.38 cm  LV E/e' medial:  12.9 LV IVS:        0.98 cm  LV e' lateral:   4.68 cm/s LVOT diam:     1.80 cm  LV E/e' lateral: 10.8 LV SV:         31 LV SV Index:   14 LVOT Area:     2.54 cm  RIGHT VENTRICLE RV Basal diam:  2.35 cm LEFT ATRIUM             Index       RIGHT ATRIUM          Index LA diam:        2.80 cm 1.29 cm/m  RA Area:     9.31 cm LA Vol (A2C):   18.6 ml 8.57 ml/m  RA Volume:   15.80 ml 7.28 ml/m LA Vol (A4C):   29.8 ml 13.74 ml/m LA Biplane Vol: 25.0 ml 11.52 ml/m  AORTIC VALVE                   PULMONIC VALVE AV Area (Vmax):    1.72 cm    PV Vmax:       1.00 m/s AV Area (Vmean):   1.76 cm    PV Vmean:      68.800 cm/s AV Area (VTI):     1.97 cm    PV VTI:  0.136 m AV Vmax:           121.00 cm/s PV Peak grad:  4.0 mmHg AV Vmean:          83.200 cm/s PV Mean grad:  2.0 mmHg AV VTI:            0.155 m AV Peak Grad:      5.9 mmHg AV Mean Grad:      3.0 mmHg LVOT Vmax:         81.60 cm/s LVOT Vmean:        57.600 cm/s LVOT VTI:          0.120 m LVOT/AV VTI ratio: 0.77  AORTA Ao Root diam: 2.90 cm MITRAL VALVE MV Area (PHT): 5.20 cm    SHUNTS MV Decel Time: 146 msec    Systemic VTI:  0.12 m MV E velocity: 50.60 cm/s  Systemic Diam: 1.80 cm MV A velocity: 63.80 cm/s MV E/A ratio:  0.79 Kathlyn Sacramento MD Electronically signed by Kathlyn Sacramento MD Signature Date/Time: 12/04/2019/12:50:37 PM    Final     Assessment: 59 y.o. male with PMH of renal transplant in 2011 at [facility], HTN, T2DM and HLD was admitted at [facility] for recent STEMI s/p PCI and DES, CHF with EF 15-20%, cardiogenic shock with hypotension, AKI, ileus. On heparin IV prior to cath lab then OR. TEE found to have LV thrombus. No LVED placed. Last seen following commands at 16:42pm, but did not check pupils. He came back to [place]  found to have unequal pupils. Code stroke called. With reversal of muscular blockade, he was able to follow all simple commands, moving all extremities, but still has left pupil 63mm and right pupil 1.74mm, not reactive to light. Pt not tPA candidate due to STEMI with LV thrombus, recent heparin IV and nondisabling symptoms. Not IR candidate due to no focal deficit as well as AKI. Will do stat CT head to rule out bleeding. If negative, OK to continue heparin IV.   etiology for his anisocoria is not quite clear, still most likely due to anesthesia reagent, will continue to monitor to see if resolving as sedation wears off.   Plan: - CT head stat to rule out bleeding, if neg, OK to continue heparin IV - continue monitor pupils to see if any changes with sedation wears off. - will follow  Thank you for this consultation and allowing [place] to participate in the care of this patient.  Rosalin Hawking, MD PhD Stroke Neurology 12/05/2019 6:49 PM

## 2019-12-05 NOTE — Transfer of Care (Signed)
Immediate Anesthesia Transfer of Care Note  Patient: Casey [name].  Procedure(s) Performed: TRANSESOPHAGEAL ECHOCARDIOGRAM (TEE) (N/A )  Patient Location: SICU  Anesthesia Type:General  Level of Consciousness: awake  Airway & Oxygen Therapy: Patient remains intubated per anesthesia plan  Post-op Assessment: Report given to RN  Post vital signs: Reviewed  Last Vitals:  Vitals Value Taken Time  BP    Temp    Pulse    Resp    SpO2      Last Pain:  Vitals:   12/05/19 1622  PainSc: 0-No pain         Complications: see quick notes

## 2019-12-05 NOTE — Progress Notes (Signed)
Progress Note  Patient Name: Casey [name]. Date of Encounter: 12/05/2019  [facility] Cardiologist: Fletcher Anon  Subjective   Patient complains of abdominal distention and poor appetite.  No chest pain or shortness of breath.  Inpatient Medications    Scheduled Meds: . aspirin  81 mg Oral Daily  . Chlorhexidine Gluconate Cloth  6 each Topical Daily  . insulin aspart  0-9 Units Subcutaneous Q4H  . insulin glargine  10 Units Subcutaneous Daily  . predniSONE  5 mg Oral Q breakfast  . rosuvastatin  20 mg Oral q1800  . sodium chloride flush  3 mL Intravenous Q12H  . tacrolimus  3 mg Oral BID  . ticagrelor  90 mg Oral BID   Continuous Infusions: . sodium chloride Stopped (12/04/19 1500)  . sodium chloride Stopped (12/04/19 1500)  . sodium chloride 50 mL/hr at 12/05/19 0400  . heparin 1,400 Units/hr (12/05/19 0400)  . milrinone 0.25 mcg/kg/min (12/05/19 0400)   PRN Meds: sodium chloride, acetaminophen, ALPRAZolam, HYDROcodone-acetaminophen, ondansetron (ZOFRAN) IV, sodium chloride flush   Vital Signs    Vitals:   12/05/19 0400 12/05/19 0700 12/05/19 0800 12/05/19 0900  BP: (!) 89/62 (!) 88/59 (!) 91/52 94/64  Pulse: (!) 120 (!) 111 (!) 121 (!) 121  Resp: (!) 35 18 18 (!) 24  Temp: 97.8 F (36.6 C) 98.3 F (36.8 C)    TempSrc: Axillary     SpO2: 96% 95% 96% 95%  Weight:      Height:        Intake/Output Summary (Last 24 hours) at 12/05/2019 0956 Last data filed at 12/05/2019 0400 Gross per 24 hour  Intake 1719.37 ml  Output 275 ml  Net 1444.37 ml   Last 3 Weights 12/05/2019 12/03/2019 05/19/2015  Weight (lbs) 208 lb 5.4 oz 203 lb 14.8 oz 204 lb  Weight (kg) 94.5 kg 92.5 kg 92.534 kg  Some encounter information is confidential and restricted. Go to Review Flowsheets activity to see all data.      Telemetry    Sinus tachycardia - Personally Reviewed  ECG    No new tracing. - Personally Reviewed  Physical Exam   GEN: No acute distress.   Neck: JVP  ~10 cm. Cardiac: Tachycardic but regular.  No murmurs. Respiratory: Faint bibasilar crackles. GI: Soft, mildly distended.  Non-tender. MS: No edema; No deformity.  Warm extremities. Neuro:  Nonfocal  Psych: Normal affect   Labs    High Sensitivity Troponin:   Recent Labs  Lab 12/03/19 1956 12/03/19 2233 12/04/19 0034  TROPONINIHS >27,000* >27,000* >27,000*      Chemistry Recent Labs  Lab 12/03/19 1956 12/03/19 1956 12/04/19 0034 12/04/19 1245 12/05/19 0725  NA 136   < > 134* 135 132*  K 4.7   < > 4.8 4.9 4.6  CL 100   < > 102 103 99  CO2 25   < > 22 21* 19*  GLUCOSE 221*   < > 228* 207* 244*  BUN 18   < > 20 30* 53*  CREATININE 1.46*   < > 1.53* 1.86* 3.10*  CALCIUM 10.3   < > 9.5 9.7 9.8  PROT 8.5*  --  6.7  --  7.1  ALBUMIN 4.4  --  3.6  --  3.2*  AST 267*  --  245*  --  79*  ALT 82*  --  67*  --  45*  ALKPHOS 89  --  75  --  78  BILITOT 1.4*  --  1.2  --  1.2  GFRNONAA 52*   < > 49* 39* 21*  ANIONGAP 11   < > 10 11 14    < > = values in this interval not displayed.     Hematology Recent Labs  Lab 12/03/19 1956 12/04/19 0035 12/05/19 0725  WBC 23.9* 22.9* 24.0*  RBC 6.19* 5.53 5.19  HGB 17.4* 15.3 14.4  HCT 52.1* 46.1 43.1  MCV 84.2 83.4 83.0  MCH 28.1 27.7 27.7  MCHC 33.4 33.2 33.4  RDW 14.6 14.6 14.6  PLT 303 314 316    BNPNo results for input(s): BNP, PROBNP in the last 168 hours.   DDimer No results for input(s): DDIMER in the last 168 hours.   Radiology    DG Abd 1 View  Result Date: 12/05/2019 CLINICAL DATA:  Abdominal distension.  Admitted with STEMI. EXAM: ABDOMEN - 1 VIEW COMPARISON:  05/19/2015 FINDINGS: Diffuse gas dilated bowel primarily affecting stomach and small bowel with gas seen to the level of the ileum. Proximal colon is distended with remaining colon not well visualized. No rectal impaction or colonic volvulus appearance. No concerning mass effect or evidence of extraluminal gas, although supine. IMPRESSION: Diffuse gas  dilated bowel. Although the distal colon is less affected, ileus is still favored. Electronically Signed   By: Monte Fantasia M.D.   On: 12/05/2019 09:32   CARDIAC CATHETERIZATION  Result Date: 12/03/2019  There is severe left ventricular systolic dysfunction.  LV Tieshia Rettinger diastolic pressure is moderately elevated.  The left ventricular ejection fraction is less than 25% by visual estimate.  Mid Cx to Dist Cx lesion is 95% stenosed.  3rd Mrg lesion is 80% stenosed.  A drug-eluting stent was successfully placed using a STENT RESOLUTE ONYX 2.5X15.  Prox Cx lesion is 30% stenosed.  Mid RCA to Dist RCA lesion is 99% stenosed.  Mid LAD-1 lesion is 100% stenosed.  Post intervention, there is a 0% residual stenosis.  Mid LAD-2 lesion is 40% stenosed.  Prox LAD lesion is 30% stenosed.  1.  Late presenting anterior ST elevation myocardial infarction.  The culprit is an occluded mid LAD.  However, the patient also has severe three-vessel coronary artery disease.  There is also significant myocardial bridge in the distal LAD. 2.  Severely reduced LV systolic function with an EF of 15% with mid to distal anterior, apical and distal inferior akinesis.  No evidence of LV thrombus. 3.  High normal systolic blood pressure and moderately elevated left ventricular Haylyn Halberg-diastolic pressure at 24 mmHg. 3.  Successful angioplasty and drug-eluting stent placement to the mid LAD. Recommendations: In spite of late presentation and significant underlying three-vessel coronary artery disease, the patient was not in cardiogenic shock.  I elected to revascularize the LAD in spite of late presentation given continued chest pain and EKG changes.  The patient reported resolution of chest pain by the Cristan Hout of the procedure. He continues to have significant disease involving the RCA and left circumflex.  The whole distal RCA is diffusely diseased and not revascularizable.  The left circumflex can be treated with staged PCI before hospital  discharge.  I elected not to do that today given underlying chronic kidney disease and kidney transplant status. Continue dual antiplatelet therapy for at least 1 year. Aggressive treatment of risk factors. We will avoid hydration given significant reduced EF and at the same time will only diuresis if needed based on his respiratory status to decrease the chance of contrast-induced nephropathy.   DG Chest Portable  1 View  Result Date: 12/03/2019 CLINICAL DATA:  Chest pain EXAM: PORTABLE CHEST 1 VIEW COMPARISON:  03/06/2011 FINDINGS: Low lung volumes. Heart and mediastinal contours are within normal limits. No focal opacities or effusions. No acute bony abnormality. IMPRESSION: No active disease. Electronically Signed   By: Rolm Baptise M.D.   On: 12/03/2019 20:30   ECHOCARDIOGRAM COMPLETE  Result Date: 12/04/2019    ECHOCARDIOGRAM REPORT   Patient Name:   Casey [name]. Date of Exam: 12/04/2019 Medical Rec #:  [id number]           Height:       73.0 in Accession #:    [id number]          Weight:       203.9 lb Date of Birth:  [birth year]            BSA:          2.169 m Patient Age:    59 years            BP:           92/75 mmHg Patient Gender: M                   HR:           90 bpm. Exam Location:  [facility] Procedure: 2D Echo, Color Doppler, Cardiac Doppler and Intracardiac            Opacification Agent Indications:     Acute myocardial infarction  History:         Patient has no prior history of Echocardiogram examinations.                  CKD; Risk Factors:Hypertension and Diabetes.  Sonographer:     Charmayne Sheer RDCS (AE) Referring Phys:  [place] Diagnosing Phys: Kathlyn Sacramento MD  Sonographer Comments: No subcostal window. IMPRESSIONS  1. Echo contrast was given. There is heavy smoke in the apex with possible early thrombus formation. . Left ventricular ejection fraction, by estimation, is 20 to 25%. The left ventricle has severely decreased function. The left ventricle demonstrates  regional wall motion abnormalities (see scoring diagram/findings for description). The left ventricular internal cavity size was mildly dilated. There is mild left ventricular hypertrophy. Left ventricular diastolic parameters are consistent with Grade I  diastolic dysfunction (impaired relaxation). There is akinesis of the left ventricular, mid-apical anteroseptal wall, anterolateral wall and anterior segment. There is dyskinesis of the left ventricular, apical segment.  2. Right ventricular systolic function is normal. The right ventricular size is normal.  3. The mitral valve is normal in structure. No evidence of mitral valve regurgitation. No evidence of mitral stenosis.  4. The aortic valve is normal in structure. Aortic valve regurgitation is not visualized. Mild aortic valve sclerosis is present, with no evidence of aortic valve stenosis.  5. The inferior vena cava is normal in size with greater than 50% respiratory variability, suggesting right atrial pressure of 3 mmHg. FINDINGS  Left Ventricle: Echo contrast was given. There is heavy smoke in the apex with possible early thrombus formation. Left ventricular ejection fraction, by estimation, is 20 to 25%. The left ventricle has severely decreased function. The left ventricle demonstrates regional wall motion abnormalities. Definity contrast agent was given IV to delineate the left ventricular endocardial borders. The left ventricular internal cavity size was mildly dilated. There is mild left ventricular hypertrophy. Left ventricular diastolic parameters are consistent with Grade I diastolic  dysfunction (impaired relaxation). Right Ventricle: The right ventricular size is normal. No increase in right ventricular wall thickness. Right ventricular systolic function is normal. Left Atrium: Left atrial size was normal in size. Right Atrium: Right atrial size was normal in size. Pericardium: There is no evidence of pericardial effusion. Mitral Valve: The mitral  valve is normal in structure. There is mild calcification of the mitral valve leaflet(s). Mild mitral annular calcification. No evidence of mitral valve regurgitation. No evidence of mitral valve stenosis. Tricuspid Valve: The tricuspid valve is normal in structure. Tricuspid valve regurgitation is not demonstrated. No evidence of tricuspid stenosis. Aortic Valve: The aortic valve is normal in structure. Aortic valve regurgitation is not visualized. Mild aortic valve sclerosis is present, with no evidence of aortic valve stenosis. Aortic valve mean gradient measures 3.0 mmHg. Aortic valve peak gradient measures 5.9 mmHg. Aortic valve area, by VTI measures 1.97 cm. Pulmonic Valve: The pulmonic valve was normal in structure. Pulmonic valve regurgitation is not visualized. No evidence of pulmonic stenosis. Aorta: The aortic root is normal in size and structure. Venous: The inferior vena cava is normal in size with greater than 50% respiratory variability, suggesting right atrial pressure of 3 mmHg. IAS/Shunts: No atrial level shunt detected by color flow Doppler.  LEFT VENTRICLE PLAX 2D LVIDd:         4.27 cm  Diastology LVIDs:         3.51 cm  LV e' medial:    3.92 cm/s LV PW:         1.38 cm  LV E/e' medial:  12.9 LV IVS:        0.98 cm  LV e' lateral:   4.68 cm/s LVOT diam:     1.80 cm  LV E/e' lateral: 10.8 LV SV:         31 LV SV Index:   14 LVOT Area:     2.54 cm  RIGHT VENTRICLE RV Basal diam:  2.35 cm LEFT ATRIUM             Index       RIGHT ATRIUM          Index LA diam:        2.80 cm 1.29 cm/m  RA Area:     9.31 cm LA Vol (A2C):   18.6 ml 8.57 ml/m  RA Volume:   15.80 ml 7.28 ml/m LA Vol (A4C):   29.8 ml 13.74 ml/m LA Biplane Vol: 25.0 ml 11.52 ml/m  AORTIC VALVE                   PULMONIC VALVE AV Area (Vmax):    1.72 cm    PV Vmax:       1.00 m/s AV Area (Vmean):   1.76 cm    PV Vmean:      68.800 cm/s AV Area (VTI):     1.97 cm    PV VTI:        0.136 m AV Vmax:           121.00 cm/s PV Peak  grad:  4.0 mmHg AV Vmean:          83.200 cm/s PV Mean grad:  2.0 mmHg AV VTI:            0.155 m AV Peak Grad:      5.9 mmHg AV Mean Grad:      3.0 mmHg LVOT Vmax:         81.60  cm/s LVOT Vmean:        57.600 cm/s LVOT VTI:          0.120 m LVOT/AV VTI ratio: 0.77  AORTA Ao Root diam: 2.90 cm MITRAL VALVE MV Area (PHT): 5.20 cm    SHUNTS MV Decel Time: 146 msec    Systemic VTI:  0.12 m MV E velocity: 50.60 cm/s  Systemic Diam: 1.80 cm MV A velocity: 63.80 cm/s MV E/A ratio:  0.79 Kathlyn Sacramento MD Electronically signed by Kathlyn Sacramento MD Signature Date/Time: 12/04/2019/12:50:37 PM    Final     Cardiac Studies   See imaging above.  Patient Profile     59 y.o. male with history of kidney transplant, type 2 diabetes, essential hypertension and hyperlipidemia who presented yesterday with chest pain of 48-hour duration and was found to have extensive anterior Q waves with persistent ST elevation.  Assessment & Plan    Late presenting anterior STEMI: No chest pain reported, s/p PCI to mid LAD.  Severe LCx RCA disease was not intervened upon.  RCA felt to be a poor target for PCI.  Continue aspirin and ticagrelor.  Continue rosuvastatin.  Ideally, would proceed with PCI to LCx in the near future.  However, given oliguric renal failure with history of kidney transplant, we will defer for now.  If he becomes more hemodynamically unstable, intervention may need to be undertaken regardless of renal function.  Acute systolic heart failure: Patient with worsening renal function.  Also complaining of feeling bloated, concerning for hypoperfusion.  He remains on milrinone 0.25 mcg/kg/min.  Continue milrinone at current dose.  Check lactic acid level.  Discussed with Dr. Haroldine Laws; plan to transfer to [facility] for advanced heart failure management, including RHC and possible hemodynamic support.  If it does not appear that transfer will happen soon, we may need to establish central venous access  here to facilitate CVP/SvO2 monitoring and additional vasopressor support.  Acute kidney injury superimposed on chronic kidney disease: Patient s/p renal transplant.  Poor urine output noted with rising creatinine.  Suspect this this is a combination of CIN, ATN from transient hypotension and low cardiac output.  No improvement with gentle hydration yesterday.  Avoid nephrotoxic drugs.  Evidence of mild volume overload today, but given that patient is breathing comfortably, I will defer diuresis at this time.  However, we may need to consider this if volume status worsens.  Management of transplant medications per nephrology.  For questions or updates, please contact [place] Please consult [email or web address] for contact info under [facility] Cardiology.   CRITICAL CARE Performed by: Nelva Bush, MD  Total critical care time: 40 minutes  Critical care time was exclusive of separately billable procedures and treating other patients.  Critical care was necessary to treat or prevent imminent or life-threatening deterioration.  Critical care was time spent personally by me (independent of midlevel providers or residents) on the following activities: development of treatment plan with patient and/or surrogate as well as nursing, discussions with consultants, evaluation of patient's response to treatment, examination of patient, obtaining history from patient or surrogate, ordering and performing treatments and interventions, ordering and review of laboratory studies, ordering and review of radiographic studies, pulse oximetry and re-evaluation of patient's condition.  Signed, Nelva Bush, MD  12/05/2019, 9:56 AM

## 2019-12-05 NOTE — Brief Op Note (Signed)
12/05/2019  6:05 PM  PATIENT:  Casey [name].  59 y.o. male  PRE-OPERATIVE DIAGNOSIS:  CARGIOGENIC SHOCK  POST-OPERATIVE DIAGNOSIS:  TEE, LV apical thrombus  PROCEDURE:  Procedure(s): TRANSESOPHAGEAL ECHOCARDIOGRAM (TEE) (N/A)  SURGEON:  Surgeon(s) and Role:    Ivin Poot, MD - Primary  PHYSICIAN ASSISTANT:   ASSISTANTS: none   ANESTHESIA:   general  EBL:  none  BLOOD ADMINISTERED:none  DRAINS: none   LOCAL MEDICATIONS USED:  NONE  SPECIMEN:  No Specimen  DISPOSITION OF SPECIMEN:  N/A  COUNTS:  YES  TOURNIQUET:  * No tourniquets in log *  DICTATION: .Note written in paper chart  PLAN OF CARE: return to ICU intubated  PATIENT DISPOSITION:  ICU - intubated and hemodynamically stable.   Delay start of Pharmacological VTE agent (>24hrs) due to surgical blood loss or risk of bleeding: no

## 2019-12-05 NOTE — Progress Notes (Signed)
ANTICOAGULATION CONSULT NOTE  Pharmacy Consult for heparin Indication: apical thrombus  No Known Allergies  Patient Measurements:   Heparin Dosing Weight: 92 kg  Vital Signs: Temp: 97.6 F (36.4 C) (10/19 1200) Temp Source: Oral (10/19 1200) BP: 86/66 (10/19 1200) Pulse Rate: 120 (10/19 1200)  Labs: Recent Labs    12/03/19 1956 12/03/19 1956 12/03/19 2233 12/04/19 0034 12/04/19 0035 12/04/19 1245 12/04/19 2140 12/05/19 0725  HGB 17.4*   < >  --   --  15.3  --   --  14.4  HCT 52.1*  --   --   --  46.1  --   --  43.1  PLT 303  --   --   --  314  --   --  316  APTT 34  --   --   --   --   --   --   --   LABPROT 13.8  --   --   --   --   --   --   --   INR 1.1  --   --   --   --   --   --   --   HEPARINUNFRC  --   --   --   --   --   --  0.59 0.42  CREATININE 1.46*   < >  --  1.53*  --  1.86*  --  3.10*  CKTOTAL  --   --  2,183*  --   --   --   --   --   TROPONINIHS >27,000*  --  >27,000* >27,000*  --   --   --   --    < > = values in this interval not displayed.    Estimated Creatinine Clearance: 29 mL/min (A) (by C-G formula based on SCr of 3.1 mg/dL (H)).   Medical History: Past Medical History:  Diagnosis Date  . Chronic kidney disease 04/2009   Kidney Transplant  . Diabetes mellitus   . GERD (gastroesophageal reflux disease)    as needed reflux  . Hypertension      Assessment: 59 year old male presented with STEMI s/p PCI to mid LAD. Pt noted to have apical smoke on ECHO concerning for early thrombus. Pharmacy asked to continue heparin for now upon  transfer from [facility]. Heparin level therapeutic this morning.  Goal of Therapy:  Heparin level 0.3-0.7 units/ml Monitor platelets by anticoagulation protocol: Yes   Plan:  -Continue heparin 1400 units/h -Daily heparin level and CBC   Arrie Senate, PharmD, BCPS Clinical Pharmacist [phone] Please check AMION for all [facility] numbers 12/05/2019

## 2019-12-05 NOTE — Procedures (Signed)
Arterial Catheter Insertion Procedure Note  Casey [name]  [id number]  17-Dec-1960  Date:12/05/19  Time:2:11 PM    Provider Performing: Makynlie Rossini Rodman Pickle    Procedure: Insertion of Arterial Line [phone]) with US guidance ([id number])   Indication(s) Blood pressure monitoring and/or need for frequent ABGs  Consent Risks of the procedure as well as the alternatives and risks of each were explained to the patient and/or caregiver.  Consent for the procedure was obtained and is signed in the bedside chart  Anesthesia None   Time Out Verified patient identification, verified procedure, site/side was marked, verified correct patient position, special equipment/implants available, medications/allergies/relevant history reviewed, required imaging and test results available.   Sterile Technique Maximal sterile technique including full sterile barrier drape, hand hygiene, sterile gown, sterile gloves, mask, hair covering, sterile ultrasound probe cover (if used).   Procedure Description Area of catheter insertion was cleaned with chlorhexidine and draped in sterile fashion. With real-time ultrasound guidance an arterial catheter was placed into the right radial artery.  Appropriate arterial tracings confirmed on monitor.     Complications/Tolerance None; patient tolerated the procedure well.   EBL Minimal   Specimen(s) None  Rodman Pickle, M.D. [facility] Pulmonary/Critical Care Medicine 12/05/2019 2:12 PM

## 2019-12-05 NOTE — Anesthesia Preprocedure Evaluation (Addendum)
Anesthesia Evaluation  Patient identified by MRN, date of birth, ID band  Reviewed: Allergy & Precautions, Patient's Chart, lab work & pertinent test results  Airway Mallampati: II  TM Distance: >3 FB Neck ROM: Full    Dental  (+) Teeth Intact   Pulmonary neg pulmonary ROS,    Pulmonary exam normal        Cardiovascular hypertension, + CAD, + Past MI and + Cardiac Stents   Rhythm:Regular Rate:Tachycardia  ECHO: 1. Echo contrast was given. There is heavy smoke in the apex with possible early thrombus formation. . Left ventricular ejection fraction, by estimation, is 20 to 25%. The left ventricle has severely decreased function. The left ventricle demonstrates regional wall motion abnormalities (see scoring diagram/findings for description). The left ventricular internal cavity size was mildly dilated. There is mild left ventricular hypertrophy. Left ventricular diastolic parameters are consistent with Grade I diastolic dysfunction (impaired relaxation). There is akinesis of the left ventricular, mid-apical anteroseptal wall, anterolateral wall and anterior segment. There is dyskinesis of the left ventricular, apical segment. 2. Right ventricular systolic function is normal. The right ventricular size is normal. 3. The mitral valve is normal in structure. No evidence of mitral valve regurgitation. No evidence of mitral stenosis. 4. The aortic valve is normal in structure. Aortic valve regurgitation is not visualized. Mild aortic valve sclerosis is present, with no evidence of aortic valve stenosis. 5. The inferior vena cava is normal in size with greater than 50% respiratory variability, suggesting right atrial pressure of 3 mmHg.  CATH:  There is severe left ventricular systolic dysfunction.  LV end diastolic pressure is moderately elevated.  The left ventricular ejection fraction is less than 25% by visual estimate.  Mid Cx to  Dist Cx lesion is 95% stenosed.  3rd Mrg lesion is 80% stenosed.  A drug-eluting stent was successfully placed using a STENT RESOLUTE ONYX  2.5X15.  Prox Cx lesion is 30% stenosed.  Mid RCA to Dist RCA lesion is 99% stenosed.  Mid LAD-1 lesion is 100% stenosed.  Post intervention, there is a 0% residual stenosis.  Mid LAD-2 lesion is 40% stenosed.  Prox LAD lesion is 30% stenosed.  1. Late presenting anterior ST elevation myocardial infarction. The culprit is an occluded mid LAD. However, the patient also has severe three-vessel coronary artery disease. There is also significant  myocardial bridge in the distal LAD. 2. Severely reduced LV systolic function with an EF of 15% with mid to distal anterior, apical and distal inferior akinesis. No evidence of LV  thrombus. 3. High normal systolic blood pressure and moderately elevated left ventricular end-diastolic pressure at 24 mmHg. 3. Successful angioplasty and drug-eluting stent placement to the mid LAD.   Neuro/Psych negative neurological ROS     GI/Hepatic Neg liver ROS, GERD  ,  Endo/Other  diabetes, Insulin Dependent  Renal/GU ARFRenal diseaseKidney Transplant  negative genitourinary   Musculoskeletal negative musculoskeletal ROS (+)   Abdominal (+)  Abdomen: soft. Bowel sounds: normal.  Peds  Hematology negative hematology ROS (+)   Anesthesia Other Findings CARGIOGENIC SHOCK  Reproductive/Obstetrics                           Anesthesia Physical Anesthesia Plan  ASA: V and emergent  Anesthesia Plan: General   Post-op Pain Management:    Induction: Intravenous  PONV Risk Score and Plan: 2 and Ondansetron, Dexamethasone and Treatment may vary due to age or medical condition  Airway Management  Planned: Oral ETT and Mask  Additional Equipment: Arterial line, CVP, PA Cath, TEE, 3D TEE and Ultrasound Guidance Line Placement  Intra-op Plan:   Post-operative Plan:  Post-operative intubation/ventilation  Informed Consent: I have reviewed the patients History and Physical, chart, labs and discussed the procedure including the risks, benefits and alternatives for the proposed anesthesia with the patient or authorized representative who has indicated his/her understanding and acceptance.     Dental advisory given  Plan Discussed with: CRNA  Anesthesia Plan Comments: (Per H/P 10/19: "59 y/o, nonsmoker, AAM s/p remote renal transplant in 2011 at [facility] + h/o HTN, T2DM and HLD, initially admitted to [facility] on 10/17 with late presentation anterior STEMI. HS trop > 27,000. LHC showed severe 3VCAD w/ culprit lesion being an occluded mid LAD, that was treated w/ PCI +DES.  Also w/ significant disease involving the RCA and left circumflex.  The whole distal RCA is diffusely diseased and not revascularizable.  The left circumflex can be treated with staged PCI (defered at time of initial cath due to underlying CKD). LVG demonstrated severely reduced LVEF ~15%. 2D Echo showed EF ~20-25%, ?early thrombus formation at apex. RV ok."   12/04/19 ECHO: 1. Echo contrast was given. There is heavy smoke in the apex with possible early thrombus formation. . Left ventricular ejection fraction, by estimation, is 20 to 25%. The left ventricle has severely decreased function. The left ventricle demonstrates regional wall motion abnormalities (see scoring diagram/findings for description). The left ventricular internal cavity size was mildly dilated. There is mild left ventricular hypertrophy. Left ventricular diastolic parameters are consistent with Grade I diastolic dysfunction (impaired relaxation). There is akinesis of the left ventricular, mid-apical anteroseptal wall, anterolateral wall and anterior segment. There is dyskinesis of the left ventricular, apical segment. 2. Right ventricular systolic function is normal. The right ventricular size is normal. 3. The mitral valve is  normal in structure. No evidence of mitral valve regurgitation. No evidence of mitral stenosis. 4. The aortic valve is normal in structure. Aortic valve regurgitation is not visualized. Mild aortic valve sclerosis is present, with no evidence of aortic valve stenosis. 5. The inferior vena cava is normal in size with greater than 50% respiratory variability, suggesting right atrial pressure of 3 mmHg. Cath 12/04/19:  There is severe left ventricular systolic dysfunction.  LV end diastolic pressure is moderately elevated.  The left ventricular ejection fraction is less than 25% by visual  estimate.  Mid Cx to Dist Cx lesion is 95% stenosed.  3rd Mrg lesion is 80% stenosed.  A drug-eluting stent was successfully placed using a STENT RESOLUTE ONYX  2.5X15.  Prox Cx lesion is 30% stenosed.  Mid RCA to Dist RCA lesion is 99% stenosed.  Mid LAD-1 lesion is 100% stenosed.  Post intervention, there is a 0% residual stenosis.  Mid LAD-2 lesion is 40% stenosed.  Prox LAD lesion is 30% stenosed. 1. Late presenting anterior ST elevation myocardial infarction. The culprit is an occluded mid LAD. However, the patient also has severe three-vessel coronary artery disease. There is also significant myocardial bridge in the distal LAD. 2. Severely reduced LV systolic function with an EF of 15% with mid to distal anterior, apical and distal inferior akinesis. No evidence of LV thrombus. 3. High normal systolic blood pressure and moderately elevated left ventricular end-diastolic pressure at 24 mmHg. 3. Successful angioplasty and drug-eluting stent placement to the mid LAD.)       Anesthesia Quick Evaluation

## 2019-12-05 NOTE — [place] Notes (Signed)
Patient accepted at  [facility] [place]. Room number [phone]. Patient alert and oriented. No complaints of chest pain or shortness of breath at this time. Patient is on room air. Foley intact with minimal urine output. Patients wife updated.

## 2019-12-05 NOTE — Consult Note (Signed)
NAME:  Casey Fauver., MRN:  [id number], DOB:  [birth year], LOS: 0 ADMISSION DATE:  12/05/2019, CONSULTATION DATE:  12/05/19 REFERRING MD:  Loralie Champagne, MD CHIEF COMPLAINT:  Vent and CCM management  Brief History   59 year old male initially admitted to [facility] for anterior STEMI. Underwent cardiac cath and had DES to LAD complicated by post-cath cardiogenic shock and acute on chronic kidney injury. He was transferred to [facility] for intervention. On arrival he was hypotensive with SBP in 80s. He went to the cath lab for Swan-Ganz and then TEE in the OR. Initially planned for impella however procedure cancelled on finding an LV thrombus on TEE. On return to ICU room he remained in cardiogenic shock on multiple pressors. He was also noted to have unequal pupils which prompted code stroke. Patient remained intubated post-op due to critical condition. PCCM consulted for assistance  History of present illness   As above  Past Medical History  S/p renal transplant in 2011, HTN, DM@, HLD, chronic systolic heart failure  Significant Hospital Events   10/19 Admitted to [facility]  Consults:  PCCM Neuro  Procedures:  10/17 Cardiac cath with DES to LAD 10/19 Swan ganz placment, TEE  Significant Diagnostic Tests:    Micro Data:    Antimicrobials:    Interim history/subjective:  As above  Objective   Blood pressure (!) 84/61, pulse (!) 125, resp. rate (!) 42, SpO2 95 %.    Vent Mode: SIMV;PSV;PRVC FiO2 (%):  [50 %] 50 % Set Rate:  [12 bmp] 12 bmp Vt Set:  [630 mL] 630 mL PEEP:  [5 cmH20] 5 cmH20 Pressure Support:  [10 cmH20] 10 cmH20 Plateau Pressure:  [21 cmH20] 21 cmH20   Intake/Output Summary (Last 24 hours) at 12/05/2019 1831 Last data filed at 12/05/2019 1600 Gross per 24 hour  Intake 30 ml  Output 20 ml  Net 10 ml   There were no vitals filed for this visit.  Physical Exam: General: Well-appearing, no acute distress HENT: [place], AT, ETT in place Eyes: EOMI, no  scleral icterus, right pinpoint pupil and left 21mm pupil, minimally reactive Respiratory: Clear to auscultation bilaterally.  No crackles, wheezing or rales Cardiovascular: RRR, -M/R/G, no JVD, Swanz-ganz in place, R arterial line  GI: distended, hypoactive BS, non-tender Extremities:-Edema,-tenderness Neuro: Awake, does not follow commands, moves extremities spontaneously x 4 Skin: Intact, no rashes or bruising Psych: Normal mood, normal affect GU: Foley in place  Resolved Hospital Problem list     Assessment & Plan:   Acute encephalopathy, unclear - may be sedation related. Eye exam abnormal otherwise no focal deficits --Neuro consulted --F/u STAT head CT  Acute hypoxemic respiratory failure --Full vent support --Daily SBT/WUA as appropriate --PAD protocol: Precedex, Fentanyl gtt, PRN Versed  Cardiogenic shock: Not a candidate for impella or IABP due to thrombus Anterior STEMI s/p PCI and DES to LAD EF 15-20% --Continue levophed, neo and milrinone --Start vasopressin --MAP goal >65 --Daily Co-ox --Continue ASA and Brilinta --Obtain blood cultures --Trend LA --Trend troponin 27K>>  LV thrombus --Will need to restart heparin if CT head is negative  Atrial fibrillation --Amio gtt --Holding heparin  Ileus --Hold TF --Primary team started Zosyn   S/p renal transplant AoCKD IV --Renal transplant doppler --Continue prednisone and prograf --Obtain prograf level --Monitor UOP/Cr --Consider Nephro consult tomorrow  DM2 --CBG q4h --SSI  Best practice:  Diet: NPO Pain/Anxiety/Delirium protocol (if indicated): As above VAP protocol (if indicated): Yes DVT prophylaxis: Hold for now.  Resume heparin gtt if CT head neg GI prophylaxis: PPI Glucose control: CBG q4h Mobility: BR Code Status: Full Family Communication: Per primary Disposition: Remain in ICU  Labs   CBC: Recent Labs  Lab 12/03/19 1956 12/03/19 1956 12/04/19 0035 12/05/19 0725 12/05/19 1548  12/05/19 1549 12/05/19 1714  WBC 23.9*  --  22.9* 24.0*  --   --   --   NEUTROABS  --   --  18.5*  --   --   --   --   HGB 17.4*   < > 15.3 14.4 13.9 14.3 14.6  HCT 52.1*   < > 46.1 43.1 41.0 42.0 43.0  MCV 84.2  --  83.4 83.0  --   --   --   PLT 303  --  314 316  --   --   --    < > = values in this interval not displayed.    Basic Metabolic Panel: Recent Labs  Lab 12/03/19 1956 12/03/19 1956 12/04/19 0034 12/04/19 0034 12/04/19 1245 12/05/19 0725 12/05/19 1548 12/05/19 1549 12/05/19 1714  NA 136   < > 134*   < > 135 132* 135 133* 135  K 4.7   < > 4.8   < > 4.9 4.6 4.1 4.3 4.3  CL 100  --  102  --  103 99  --   --   --   CO2 25  --  22  --  21* 19*  --   --   --   GLUCOSE 221*  --  228*  --  207* 244*  --   --   --   BUN 18  --  20  --  30* 53*  --   --   --   CREATININE 1.46*  --  1.53*  --  1.86* 3.10*  --   --   --   CALCIUM 10.3  --  9.5  --  9.7 9.8  --   --   --   MG  --   --  2.1  --   --   --   --   --   --   PHOS  --   --  2.9  --   --   --   --   --   --    < > = values in this interval not displayed.   GFR: Estimated Creatinine Clearance: 29 mL/min (A) (by C-G formula based on SCr of 3.1 mg/dL (H)). Recent Labs  Lab 12/03/19 1956 12/04/19 0035 12/05/19 0725 12/05/19 1453  WBC 23.9* 22.9* 24.0*  --   LATICACIDVEN  --   --   --  1.3    Liver Function Tests: Recent Labs  Lab 12/03/19 1956 12/04/19 0034 12/05/19 0725  AST 267* 245* 79*  ALT 82* 67* 45*  ALKPHOS 89 75 78  BILITOT 1.4* 1.2 1.2  PROT 8.5* 6.7 7.1  ALBUMIN 4.4 3.6 3.2*   No results for input(s): LIPASE, AMYLASE in the last 168 hours. No results for input(s): AMMONIA in the last 168 hours.  ABG    Component Value Date/Time   PHART 7.351 12/05/2019 1714   PCO2ART 36.6 12/05/2019 1714   PO2ART 142 (H) 12/05/2019 1714   HCO3 20.2 12/05/2019 1714   TCO2 21 (L) 12/05/2019 1714   ACIDBASEDEF 5.0 (H) 12/05/2019 1714   O2SAT 99.0 12/05/2019 1714     Coagulation Profile: Recent  Labs  Lab 12/03/19 1956  INR 1.1  Cardiac Enzymes: Recent Labs  Lab 12/03/19 2233  CKTOTAL 2,183*    HbA1C: Hgb A1c MFr Bld  Date/Time Value Ref Range Status  12/03/2019 07:56 PM 7.8 (H) 4.8 - 5.6 % Final    Comment:    (NOTE)         Prediabetes: 5.7 - 6.4         Diabetes: >6.4         Glycemic control for adults with diabetes: <7.0     CBG: Recent Labs  Lab 12/04/19 2012 12/04/19 2321 12/05/19 0328 12/05/19 0732 12/05/19 1122  GLUCAP 190* 198* 201* 235* 285*    Review of Systems:   Unable to obtain due to intubated status  Past Medical History  He,  has a past medical history of Chronic kidney disease (04/2009), Diabetes mellitus, GERD (gastroesophageal reflux disease), and Hypertension.   Surgical History    Past Surgical History:  Procedure Laterality Date  . 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 CORONARY/GRAFT ACUTE MI REVASCULARIZATION N/A 12/03/2019   Procedure: Coronary/Graft Acute MI Revascularization;  Surgeon: Wellington Hampshire, MD;  Location: [facility];  Service: Cardiovascular;  Laterality: N/A;  . DIALYSIS FISTULA CREATION     last used 04/2009  . INSERTION OF DIALYSIS CATHETER     cordis dialysis catheter placement  . KIDNEY TRANSPLANT  2011  . LEFT HEART CATH AND CORONARY ANGIOGRAPHY N/A 12/03/2019   Procedure: LEFT HEART CATH AND CORONARY ANGIOGRAPHY;  Surgeon: Wellington Hampshire, MD;  Location: [place] [facility];  Service: Cardiovascular;  Laterality: N/A;     Social History   reports that he has never smoked. He has never used smokeless tobacco. He reports current alcohol use. He reports that he does not use drugs.   Family History   His family history includes Cancer in his father; Diabetes in his mother; Hyperlipidemia in his mother.    Allergies No Known Allergies   Home Medications  Prior to Admission medications   Medication Sig Start Date End Date Taking? Authorizing Provider  aspirin 81 MG chewable tablet Chew 1 tablet (81 mg total) by mouth daily. 12/06/19   Wouk, Ailene Rud, MD  heparin 25000-0.45 UT/250ML-% infusion Inject 1,400 Units/hr into the vein continuous. 12/05/19   Wouk, Ailene Rud, MD  insulin aspart (NOVOLOG) 100 UNIT/ML injection Inject 0-9 Units into the skin every 4 (four) hours. 12/05/19   Wouk, Ailene Rud, MD  milrinone [facility]) 20 MG/100 ML SOLN infusion Inject 0.0231 mg/min into the vein continuous. 12/05/19   Wouk, Ailene Rud, MD  predniSONE (DELTASONE) 5 MG tablet Take 5 mg by mouth Daily. 01/18/11   [provider]  tacrolimus (PROGRAF) 1 MG capsule Take 5 mg by mouth 2 (two) times daily.     [provider]  ticagrelor (BRILINTA) 90 MG TABS tablet Take 1 tablet (90 mg total) by mouth 2 (two) times daily. 12/05/19   Wouk, Ailene Rud, MD     Critical care time: 40 min     The patient is critically ill with multiple organ systems failure and requires high complexity decision making for assessment and support, frequent evaluation and titration of therapies, application of advanced monitoring technologies and extensive interpretation of multiple databases.  Independent Critical Care Time: 40 Minutes.   Rodman Pickle, M.D. [facility] Heckler  Pulmonary/Critical Care Medicine 12/05/2019 6:31 PM   Please see Amion for pager number to reach on-call Pulmonary and Critical Care Team.

## 2019-12-05 NOTE — Progress Notes (Signed)
PROGRESS NOTE    Jenelle Mages.  BHA:[id number] DOB: [birth year] DOA: 12/03/2019 PCP: Dion Body, MD  Outpatient Specialists: [facility]    Brief Narrative:   From cardiology's initial consult: Mr. Resurreccion is a 59 year old African American male with history of kidney transplant in 2011 with stage IIIa chronic kidney disease, type 2 diabetes, hypertension, and hyperlipidemia.  He is not a smoker and is not aware of any previous cardiac history.  The patient reports that over the last 1 to 2 months he has experienced exertional chest pain and tightness that resolved with rest.  He did not inform family his family members or his primary care physician.  He had an episode of chest pain and tightness at rest about 1 month ago that lasted about 10 minutes.  He started having substernal chest burning and tightness feeling on Friday which continued since then.  The patient thought it was due to acid reflux but his symptoms did not improve.  Today, he started having chest pain especially with taking a deep breath.  He continued to have the same tightness feeling and thus he came to the emergency room for evaluation.  An EKG was performed which showed extensive anterior and anteroseptal Q waves with persistent ST elevation.  Given his symptoms and EKG changes, code STEMI was called.  At the time of my evaluation, the patient was having 5 out of 10 chest pain.  Surprisingly, his vital signs were reasonable including normal blood pressure which if anything was on the high side.  He was mildly tachycardic with a heart rate of 110 bpm.  Given his symptoms and EKG changes, I recommended proceeding with emergent cardiac catheterization and possible PCI.  I discussed this with the patient and his wife who was at the bedside.   Assessment & Plan:   Active Problems:   Acute ST elevation myocardial infarction (STEMI) due to occlusion of left anterior descending (LAD) coronary artery (HCC)    Type 2 diabetes mellitus with hyperlipidemia (HCC)   Hyperlipidemia   History of renal transplant   CKD (chronic kidney disease), stage III (HCC)   STEMI (ST elevation myocardial infarction) ([place])   HTN (hypertension)   Acute systolic heart failure ([place])   # STEMI  Chest pain for 1 month prior to presentation. Taken emergently to cath lab given pain and ST elevations. Cath showed multi-vessel disease. One DES placed to mid LAD. Left distal circumflex will also need to be treated but cardiology opted not to intervene 10/17 given dye load and ckd - ICU for now - continue aspirin, brillinta, rosuvastatin - holding ARB, BB as below  # Apical thrombus - cont heparin  # Abdominal distention Suspect early paralytic ileus 2/2 acute illness, as not passing flatus or BMs. No n/v but no appetite.  - check KUB - NPO  # Acute systolic heart failure 2/2 ischemia. EF 20-25%. Does not appear fluid overloaded. Hypotensive with start of carvedilol yesterday, uop low 275 yesterday. Started on milrinone 10/18. Soft pressures this morning, MAPs in the 70s - cont milrinone, cardiology to eval, may need pressor support, they will be discussing w/ their heart failure team today - holding arb and bb  # AKI on CKD stage 3 Status post deceased donor kidney transplant. Dye load with cath. Baseline cr around 1.3, a.5 on admission, today to 3.1, oliguric.  - cont NS @ 50 - possible pressor support as above - maintain Foley - cont home tacrolimus and prednisone -  nephrology consulted  # T2DM A1c 7.7 08/2019. Here sugars upper 100s - hold home glipizide, start SSI - lantus 5 qhs - consider eventual transition to sglt2i given ckd, CHF  # HTN Here bps wnl - hold home clonidine and amlodipine and labetalol - cont carvedilol, losartan  # Transaminitis LFTS wnl earlier this year at [facility], here ast 200s alt under 100. Likely 2/2 hypoperfusion - trend, further w/u if elevation persists   DVT prophylaxis:  lovenox Code Status: full Family Communication: none at bedside  Status is: Inpatient  Remains inpatient appropriate because:Inpatient level of care appropriate due to severity of illness   Dispo: The patient is from: Home              Anticipated d/c is to: TBD (pt/ot consults ordered)              Anticipated d/c date is: >3 days              Patient currently is not medically stable to d/c.        Consultants:  Cardiology, nephrology  Procedures: Left heart cath 12/03/19  Antimicrobials:  none    Subjective: This morning no chest pain or sob. Abd is distended and feels uncomfortable. No appetite, no flatus or BM.   Objective: Vitals:   12/05/19 0000 12/05/19 0330 12/05/19 0400 12/05/19 0700  BP: (!) 108/58 97/62 (!) 89/62 (!) 88/59  Pulse: (!) 117 (!) 115 (!) 120 (!) 111  Resp: 17 (!) 24 (!) 35 (!) 40  Temp: 98.2 F (36.8 C)  97.8 F (36.6 C) 98.3 F (36.8 C)  TempSrc: Oral  Axillary   SpO2: 97% 95% 96% 95%  Weight:  94.5 kg    Height:        Intake/Output Summary (Last 24 hours) at 12/05/2019 0910 Last data filed at 12/05/2019 0400 Gross per 24 hour  Intake 1719.37 ml  Output 275 ml  Net 1444.37 ml   Filed Weights   12/03/19 1955 12/05/19 0330  Weight: 92.5 kg 94.5 kg    Examination:  General exam: Appears calm and comfortable  Respiratory system: Clear to auscultation. Respiratory effort normal. Cardiovascular system: S1 & S2 heard, mild systolic murmur, mild tachycardia Gastrointestinal system: Abdomen is distended, decreased BSs inferiorally, non tender Central nervous system: Alert and oriented. No focal neurological deficits. Extremities: Symmetric 5 x 5 power. Skin: No rashes, lesions or ulcers Psychiatry: Judgement and insight appear normal. Mood & affect appropriate.     Data Reviewed: I have personally reviewed following labs and imaging studies  CBC: Recent Labs  Lab 12/03/19 1956 12/04/19 0035 12/05/19 0725  WBC 23.9*  22.9* 24.0*  NEUTROABS  --  18.5*  --   HGB 17.4* 15.3 14.4  HCT 52.1* 46.1 43.1  MCV 84.2 83.4 83.0  PLT 303 314 277   Basic Metabolic Panel: Recent Labs  Lab 12/03/19 1956 12/04/19 0034 12/04/19 1245 12/05/19 0725  NA 136 134* 135 132*  K 4.7 4.8 4.9 4.6  CL 100 102 103 99  CO2 25 22 21* 19*  GLUCOSE 221* 228* 207* 244*  BUN 18 20 30* 53*  CREATININE 1.46* 1.53* 1.86* 3.10*  CALCIUM 10.3 9.5 9.7 9.8  MG  --  2.1  --   --   PHOS  --  2.9  --   --    GFR: Estimated Creatinine Clearance: 29 mL/min (A) (by C-G formula based on SCr of 3.1 mg/dL (H)). Liver Function Tests: Recent Labs  Lab 12/03/19 1956 12/04/19 0034 12/05/19 0725  AST 267* 245* 79*  ALT 82* 67* 45*  ALKPHOS 89 75 78  BILITOT 1.4* 1.2 1.2  PROT 8.5* 6.7 7.1  ALBUMIN 4.4 3.6 3.2*   No results for input(s): LIPASE, AMYLASE in the last 168 hours. No results for input(s): AMMONIA in the last 168 hours. Coagulation Profile: Recent Labs  Lab 12/03/19 1956  INR 1.1   Cardiac Enzymes: Recent Labs  Lab 12/03/19 2233  CKTOTAL 2,183*   BNP (last 3 results) No results for input(s): PROBNP in the last 8760 hours. HbA1C: Recent Labs    12/03/19 1956  HGBA1C 7.8*   CBG: Recent Labs  Lab 12/04/19 1627 12/04/19 2012 12/04/19 2321 12/05/19 0328 12/05/19 0732  GLUCAP 190* 190* 198* 201* 235*   Lipid Profile: Recent Labs    12/03/19 1956  CHOL 284*  HDL 75  LDLCALC 185*  TRIG 121  CHOLHDL 3.8   Thyroid Function Tests: Recent Labs    12/04/19 0034  TSH 0.630   Anemia Panel: No results for input(s): VITAMINB12, FOLATE, FERRITIN, TIBC, IRON, RETICCTPCT in the last 72 hours. Urine analysis:    Component Value Date/Time   COLORURINE YELLOW (A) 05/19/2015 1533   APPEARANCEUR CLOUDY (A) 05/19/2015 1533   LABSPEC 1.013 05/19/2015 1533   PHURINE 6.0 05/19/2015 1533   GLUCOSEU NEGATIVE 05/19/2015 1533   HGBUR 2+ (A) 05/19/2015 1533   BILIRUBINUR NEGATIVE 05/19/2015 1533   KETONESUR 1+  (A) 05/19/2015 1533   PROTEINUR 100 (A) 05/19/2015 1533   NITRITE POSITIVE (A) 05/19/2015 1533   LEUKOCYTESUR 3+ (A) 05/19/2015 1533   Sepsis Labs: @LABRCNTIP (procalcitonin:4,lacticidven:4)  ) Recent Results (from the past 240 hour(s))  Respiratory Panel by RT PCR (Flu A&B, Covid) - Nasopharyngeal Swab     Status: None   Collection Time: 12/03/19  7:56 PM   Specimen: Nasopharyngeal Swab  Result Value Ref Range Status   SARS Coronavirus 2 by RT PCR NEGATIVE NEGATIVE Final    Comment: (NOTE) SARS-CoV-2 target nucleic acids are NOT DETECTED.  The SARS-CoV-2 RNA is generally detectable in upper respiratoy specimens during the acute phase of infection. The lowest concentration of SARS-CoV-2 viral copies this assay can detect is 131 copies/mL. A negative result does not preclude SARS-Cov-2 infection and should not be used as the sole basis for treatment or other patient management decisions. A negative result may occur with  improper specimen collection/handling, submission of specimen other than nasopharyngeal swab, presence of viral mutation(s) within the areas targeted by this assay, and inadequate number of viral copies (<131 copies/mL). A negative result must be combined with clinical observations, patient history, and epidemiological information. The expected result is Negative.  Fact Sheet for Patients:  PinkCheek.be  Fact Sheet for Healthcare Providers:  GravelBags.it  This test is no t yet approved or cleared by the [place] FDA and  has been authorized for detection and/or diagnosis of SARS-CoV-2 by FDA under an Emergency Use Authorization (EUA). This EUA will remain  in effect (meaning this test can be used) for the duration of the COVID-19 declaration under Section 564(b)(1) of the Act, 21 U.S.C. section 360bbb-3(b)(1), unless the authorization is terminated or revoked sooner.     Influenza A by PCR  NEGATIVE NEGATIVE Final   Influenza B by PCR NEGATIVE NEGATIVE Final    Comment: (NOTE) The Xpert Xpress SARS-CoV-2/FLU/RSV assay is intended as an aid in  the diagnosis of influenza from Nasopharyngeal swab specimens and  should not be used  as a sole basis for treatment. Nasal washings and  aspirates are unacceptable for Xpert Xpress SARS-CoV-2/FLU/RSV  testing.  Fact Sheet for Patients: PinkCheek.be  Fact Sheet for Healthcare Providers: GravelBags.it  This test is not yet approved or cleared by the [place] FDA and  has been authorized for detection and/or diagnosis of SARS-CoV-2 by  FDA under an Emergency Use Authorization (EUA). This EUA will remain  in effect (meaning this test can be used) for the duration of the  Covid-19 declaration under Section 564(b)(1) of the Act, 21  U.S.C. section 360bbb-3(b)(1), unless the authorization is  terminated or revoked. Performed at [facility], [address] [place], [place] [address]   MRSA PCR Screening     Status: None   Collection Time: 12/04/19 12:03 AM   Specimen: Nasopharyngeal  Result Value Ref Range Status   MRSA by PCR NEGATIVE NEGATIVE Final    Comment:        The GeneXpert MRSA Assay (FDA approved for NASAL specimens only), is one component of a comprehensive MRSA colonization surveillance program. It is not intended to diagnose MRSA infection nor to guide or monitor treatment for MRSA infections. Performed at [facility], [address] [place], [place] [address]          Radiology Studies: CARDIAC CATHETERIZATION  Result Date: 12/03/2019  There is severe left ventricular systolic dysfunction.  LV end diastolic pressure is moderately elevated.  The left ventricular ejection fraction is less than 25% by visual estimate.  Mid Cx to Dist Cx lesion is 95% stenosed.  3rd Mrg lesion is 80% stenosed.  A drug-eluting stent was  successfully placed using a STENT RESOLUTE ONYX 2.5X15.  Prox Cx lesion is 30% stenosed.  Mid RCA to Dist RCA lesion is 99% stenosed.  Mid LAD-1 lesion is 100% stenosed.  Post intervention, there is a 0% residual stenosis.  Mid LAD-2 lesion is 40% stenosed.  Prox LAD lesion is 30% stenosed.  1.  Late presenting anterior ST elevation myocardial infarction.  The culprit is an occluded mid LAD.  However, the patient also has severe three-vessel coronary artery disease.  There is also significant myocardial bridge in the distal LAD. 2.  Severely reduced LV systolic function with an EF of 15% with mid to distal anterior, apical and distal inferior akinesis.  No evidence of LV thrombus. 3.  High normal systolic blood pressure and moderately elevated left ventricular end-diastolic pressure at 24 mmHg. 3.  Successful angioplasty and drug-eluting stent placement to the mid LAD. Recommendations: In spite of late presentation and significant underlying three-vessel coronary artery disease, the patient was not in cardiogenic shock.  I elected to revascularize the LAD in spite of late presentation given continued chest pain and EKG changes.  The patient reported resolution of chest pain by the end of the procedure. He continues to have significant disease involving the RCA and left circumflex.  The whole distal RCA is diffusely diseased and not revascularizable.  The left circumflex can be treated with staged PCI before hospital discharge.  I elected not to do that today given underlying chronic kidney disease and kidney transplant status. Continue dual antiplatelet therapy for at least 1 year. Aggressive treatment of risk factors. We will avoid hydration given significant reduced EF and at the same time will only diuresis if needed based on his respiratory status to decrease the chance of contrast-induced nephropathy.   DG Chest Portable 1 View  Result Date: 12/03/2019 CLINICAL DATA:  Chest  pain EXAM: PORTABLE CHEST  1 VIEW COMPARISON:  03/06/2011 FINDINGS: Low lung volumes. Heart and mediastinal contours are within normal limits. No focal opacities or effusions. No acute bony abnormality. IMPRESSION: No active disease. Electronically Signed   By: Rolm Baptise M.D.   On: 12/03/2019 20:30   ECHOCARDIOGRAM COMPLETE  Result Date: 12/04/2019    ECHOCARDIOGRAM REPORT   Patient Name:   Karol Liendo. Date of Exam: 12/04/2019 Medical Rec #:  [id number]           Height:       73.0 in Accession #:    [id number]          Weight:       203.9 lb Date of Birth:  1960/09/18            BSA:          2.169 m Patient Age:    72 years            BP:           92/75 mmHg Patient Gender: M                   HR:           90 bpm. Exam Location:  [facility] Procedure: 2D Echo, Color Doppler, Cardiac Doppler and Intracardiac            Opacification Agent Indications:     Acute myocardial infarction  History:         Patient has no prior history of Echocardiogram examinations.                  CKD; Risk Factors:Hypertension and Diabetes.  Sonographer:     Charmayne Sheer RDCS (AE) Referring Phys:  [place] Diagnosing Phys: Kathlyn Sacramento MD  Sonographer Comments: No subcostal window. IMPRESSIONS  1. Echo contrast was given. There is heavy smoke in the apex with possible early thrombus formation. . Left ventricular ejection fraction, by estimation, is 20 to 25%. The left ventricle has severely decreased function. The left ventricle demonstrates regional wall motion abnormalities (see scoring diagram/findings for description). The left ventricular internal cavity size was mildly dilated. There is mild left ventricular hypertrophy. Left ventricular diastolic parameters are consistent with Grade I  diastolic dysfunction (impaired relaxation). There is akinesis of the left ventricular, mid-apical anteroseptal wall, anterolateral wall and anterior segment. There is dyskinesis of the left ventricular, apical segment.  2. Right ventricular systolic  function is normal. The right ventricular size is normal.  3. The mitral valve is normal in structure. No evidence of mitral valve regurgitation. No evidence of mitral stenosis.  4. The aortic valve is normal in structure. Aortic valve regurgitation is not visualized. Mild aortic valve sclerosis is present, with no evidence of aortic valve stenosis.  5. The inferior vena cava is normal in size with greater than 50% respiratory variability, suggesting right atrial pressure of 3 mmHg. FINDINGS  Left Ventricle: Echo contrast was given. There is heavy smoke in the apex with possible early thrombus formation. Left ventricular ejection fraction, by estimation, is 20 to 25%. The left ventricle has severely decreased function. The left ventricle demonstrates regional wall motion abnormalities. Definity contrast agent was given IV to delineate the left ventricular endocardial borders. The left ventricular internal cavity size was mildly dilated. There is mild left ventricular hypertrophy. Left ventricular diastolic parameters are consistent with Grade I diastolic dysfunction (impaired relaxation). Right Ventricle: The right ventricular size  is normal. No increase in right ventricular wall thickness. Right ventricular systolic function is normal. Left Atrium: Left atrial size was normal in size. Right Atrium: Right atrial size was normal in size. Pericardium: There is no evidence of pericardial effusion. Mitral Valve: The mitral valve is normal in structure. There is mild calcification of the mitral valve leaflet(s). Mild mitral annular calcification. No evidence of mitral valve regurgitation. No evidence of mitral valve stenosis. Tricuspid Valve: The tricuspid valve is normal in structure. Tricuspid valve regurgitation is not demonstrated. No evidence of tricuspid stenosis. Aortic Valve: The aortic valve is normal in structure. Aortic valve regurgitation is not visualized. Mild aortic valve sclerosis is present, with no  evidence of aortic valve stenosis. Aortic valve mean gradient measures 3.0 mmHg. Aortic valve peak gradient measures 5.9 mmHg. Aortic valve area, by VTI measures 1.97 cm. Pulmonic Valve: The pulmonic valve was normal in structure. Pulmonic valve regurgitation is not visualized. No evidence of pulmonic stenosis. Aorta: The aortic root is normal in size and structure. Venous: The inferior vena cava is normal in size with greater than 50% respiratory variability, suggesting right atrial pressure of 3 mmHg. IAS/Shunts: No atrial level shunt detected by color flow Doppler.  LEFT VENTRICLE PLAX 2D LVIDd:         4.27 cm  Diastology LVIDs:         3.51 cm  LV e' medial:    3.92 cm/s LV PW:         1.38 cm  LV E/e' medial:  12.9 LV IVS:        0.98 cm  LV e' lateral:   4.68 cm/s LVOT diam:     1.80 cm  LV E/e' lateral: 10.8 LV SV:         31 LV SV Index:   14 LVOT Area:     2.54 cm  RIGHT VENTRICLE RV Basal diam:  2.35 cm LEFT ATRIUM             Index       RIGHT ATRIUM          Index LA diam:        2.80 cm 1.29 cm/m  RA Area:     9.31 cm LA Vol (A2C):   18.6 ml 8.57 ml/m  RA Volume:   15.80 ml 7.28 ml/m LA Vol (A4C):   29.8 ml 13.74 ml/m LA Biplane Vol: 25.0 ml 11.52 ml/m  AORTIC VALVE                   PULMONIC VALVE AV Area (Vmax):    1.72 cm    PV Vmax:       1.00 m/s AV Area (Vmean):   1.76 cm    PV Vmean:      68.800 cm/s AV Area (VTI):     1.97 cm    PV VTI:        0.136 m AV Vmax:           121.00 cm/s PV Peak grad:  4.0 mmHg AV Vmean:          83.200 cm/s PV Mean grad:  2.0 mmHg AV VTI:            0.155 m AV Peak Grad:      5.9 mmHg AV Mean Grad:      3.0 mmHg LVOT Vmax:         81.60 cm/s LVOT Vmean:  57.600 cm/s LVOT VTI:          0.120 m LVOT/AV VTI ratio: 0.77  AORTA Ao Root diam: 2.90 cm MITRAL VALVE MV Area (PHT): 5.20 cm    SHUNTS MV Decel Time: 146 msec    Systemic VTI:  0.12 m MV E velocity: 50.60 cm/s  Systemic Diam: 1.80 cm MV A velocity: 63.80 cm/s MV E/A ratio:  0.79 Kathlyn Sacramento  MD Electronically signed by Kathlyn Sacramento MD Signature Date/Time: 12/04/2019/12:50:37 PM    Final         Scheduled Meds: . aspirin  81 mg Oral Daily  . Chlorhexidine Gluconate Cloth  6 each Topical Daily  . insulin aspart  0-9 Units Subcutaneous Q4H  . insulin glargine  10 Units Subcutaneous Daily  . predniSONE  5 mg Oral Q breakfast  . rosuvastatin  20 mg Oral q1800  . sodium chloride flush  3 mL Intravenous Q12H  . tacrolimus  5 mg Oral BID  . ticagrelor  90 mg Oral BID   Continuous Infusions: . sodium chloride Stopped (12/04/19 1500)  . sodium chloride Stopped (12/04/19 1500)  . sodium chloride 50 mL/hr at 12/05/19 0400  . heparin 1,400 Units/hr (12/05/19 0400)  . milrinone 0.25 mcg/kg/min (12/05/19 0400)     LOS: 2 days    Time spent: 30 min    Desma Maxim, MD [facility]   If 7PM-7AM, please contact night-coverage [email or web address] Password [email or web address] 12/05/2019, 9:10 AM

## 2019-12-05 NOTE — Progress Notes (Signed)
CT Surgery  Patient was given general anesthesia for preparation of Impella 5.5 LVAD placement.  A TEE was performed by the anesthesia team and also reviewed by Dr. Aundra Dubin cardiology.  It was felt that the patient's TEE showed probable LV apical thrombus which would make placement of a Impella LVAD unsafe and contraindicated.  The patient was then transferred directly back to the ICU without any surgical incisions or new lines being placed.

## 2019-12-06 ENCOUNTER — Inpatient Hospital Stay (HOSPITAL_COMMUNITY): Payer: BC Managed Care – PPO

## 2019-12-06 ENCOUNTER — Encounter (HOSPITAL_COMMUNITY): Payer: Self-pay | Admitting: Cardiology

## 2019-12-06 DIAGNOSIS — N189 Chronic kidney disease, unspecified: Secondary | ICD-10-CM

## 2019-12-06 DIAGNOSIS — I5021 Acute systolic (congestive) heart failure: Secondary | ICD-10-CM | POA: Diagnosis not present

## 2019-12-06 DIAGNOSIS — R570 Cardiogenic shock: Secondary | ICD-10-CM | POA: Diagnosis not present

## 2019-12-06 DIAGNOSIS — N179 Acute kidney failure, unspecified: Secondary | ICD-10-CM

## 2019-12-06 LAB — URINALYSIS, ROUTINE W REFLEX MICROSCOPIC
Bilirubin Urine: NEGATIVE
Glucose, UA: NEGATIVE mg/dL
Ketones, ur: NEGATIVE mg/dL
Nitrite: NEGATIVE
Protein, ur: 100 mg/dL — AB
RBC / HPF: >50 RBC/hpf — ABNORMAL HIGH (ref 0–5)
Specific Gravity, Urine: 1.017 (ref 1.005–1.030)
WBC, UA: >50 WBC/hpf — ABNORMAL HIGH (ref 0–5)
pH: 5 (ref 5.0–8.0)

## 2019-12-06 LAB — COMPREHENSIVE METABOLIC PANEL
ALT: 31 U/L (ref 0–44)
AST: 52 U/L — ABNORMAL HIGH (ref 15–41)
Albumin: 2.4 g/dL — ABNORMAL LOW (ref 3.5–5.0)
Alkaline Phosphatase: 79 U/L (ref 38–126)
Anion gap: 13 (ref 5–15)
BUN: 70 mg/dL — ABNORMAL HIGH (ref 6–20)
CO2: 18 mmol/L — ABNORMAL LOW (ref 22–32)
Calcium: 8.6 mg/dL — ABNORMAL LOW (ref 8.9–10.3)
Chloride: 100 mmol/L (ref 98–111)
Creatinine, Ser: 4.48 mg/dL — ABNORMAL HIGH (ref 0.61–1.24)
GFR, Estimated: 13 mL/min — ABNORMAL LOW (ref >60)
Glucose, Bld: 354 mg/dL — ABNORMAL HIGH (ref 70–99)
Potassium: 4.3 mmol/L (ref 3.5–5.1)
Sodium: 131 mmol/L — ABNORMAL LOW (ref 135–145)
Total Bilirubin: 0.6 mg/dL (ref 0.3–1.2)
Total Protein: 5.8 g/dL — ABNORMAL LOW (ref 6.5–8.1)

## 2019-12-06 LAB — GLUCOSE, CAPILLARY
Glucose-Capillary: 176 mg/dL — ABNORMAL HIGH (ref 70–99)
Glucose-Capillary: 206 mg/dL — ABNORMAL HIGH (ref 70–99)
Glucose-Capillary: 232 mg/dL — ABNORMAL HIGH (ref 70–99)
Glucose-Capillary: 255 mg/dL — ABNORMAL HIGH (ref 70–99)
Glucose-Capillary: 293 mg/dL — ABNORMAL HIGH (ref 70–99)
Glucose-Capillary: 329 mg/dL — ABNORMAL HIGH (ref 70–99)
Glucose-Capillary: 366 mg/dL — ABNORMAL HIGH (ref 70–99)

## 2019-12-06 LAB — ECHO INTRAOPERATIVE TEE
AR max vel: 2.38 cm2
AV Area VTI: 2.86 cm2
AV Area mean vel: 2.49 cm2
AV Mean grad: 4 mmHg
AV Peak grad: 6.9 mmHg
Ao pk vel: 1.31 m/s
S' Lateral: 3.76 cm
Weight: 3333.36 oz

## 2019-12-06 LAB — CBC
HCT: 35.1 % — ABNORMAL LOW (ref 39.0–52.0)
Hemoglobin: 11.5 g/dL — ABNORMAL LOW (ref 13.0–17.0)
MCH: 27.9 pg (ref 26.0–34.0)
MCHC: 32.8 g/dL (ref 30.0–36.0)
MCV: 85.2 fL (ref 80.0–100.0)
Platelets: 346 10*3/uL (ref 150–400)
RBC: 4.12 MIL/uL — ABNORMAL LOW (ref 4.22–5.81)
RDW: 14.2 % (ref 11.5–15.5)
WBC: 17.9 10*3/uL — ABNORMAL HIGH (ref 4.0–10.5)
nRBC: 0 % (ref 0.0–0.2)

## 2019-12-06 LAB — COOXEMETRY PANEL
Carboxyhemoglobin: 0.5 % (ref 0.5–1.5)
Methemoglobin: 1.1 % (ref 0.0–1.5)
O2 Saturation: 72.7 %
Total hemoglobin: 10.7 g/dL — ABNORMAL LOW (ref 12.0–16.0)

## 2019-12-06 LAB — HEPARIN LEVEL (UNFRACTIONATED)
Heparin Unfractionated: <0.1 IU/mL — ABNORMAL LOW (ref 0.30–0.70)
Heparin Unfractionated: 0.18 IU/mL — ABNORMAL LOW (ref 0.30–0.70)

## 2019-12-06 LAB — PROCALCITONIN: Procalcitonin: 61.83 ng/mL

## 2019-12-06 MED ORDER — LACTATED RINGERS IV BOLUS: 500.0000 mL | Freq: Once | INTRAVENOUS | Status: DC

## 2019-12-06 MED ORDER — LACTATED RINGERS IV BOLUS
250.0000 mL | Freq: Once | INTRAVENOUS | Status: AC
  Administered 2019-12-06: 250 mL via INTRAVENOUS

## 2019-12-06 MED ORDER — SODIUM CHLORIDE 0.9 % IV SOLN: INTRAVENOUS | Status: DC

## 2019-12-06 MED ORDER — METOCLOPRAMIDE HCL 5 MG/ML IJ SOLN
5.0000 mg | Freq: Once | INTRAMUSCULAR | Status: AC
  Administered 2019-12-06: 5 mg via INTRAVENOUS
  Filled 2019-12-06: qty 2

## 2019-12-06 MED ORDER — IOHEXOL 9 MG/ML PO SOLN
500.0000 mL | ORAL | Status: AC
  Administered 2019-12-06 (×2): 500 mL via ORAL

## 2019-12-06 MED ORDER — SODIUM CHLORIDE 0.9% IV SOLUTION: INTRAVENOUS | Status: DC | PRN

## 2019-12-06 MED ORDER — INSULIN DETEMIR 100 UNIT/ML ~~LOC~~ SOLN
10.0000 [IU] | Freq: Every day | SUBCUTANEOUS | Status: DC
  Administered 2019-12-06 – 2019-12-15 (×10): 10 [IU] via SUBCUTANEOUS
  Filled 2019-12-06 (×11): qty 0.1

## 2019-12-06 MED ORDER — CHLORHEXIDINE GLUCONATE CLOTH 2 % EX PADS
6.0000 | MEDICATED_PAD | Freq: Every day | CUTANEOUS | Status: DC
  Administered 2019-12-06 – 2019-12-15 (×10): 6 via TOPICAL

## 2019-12-06 MED ORDER — SODIUM CHLORIDE 0.9% IV SOLUTION: INTRAVENOUS | Status: DC

## 2019-12-06 MED FILL — Magnesium Sulfate Inj 50%: INTRAMUSCULAR | Qty: 10 | Status: CN

## 2019-12-06 MED FILL — Heparin Sodium (Porcine) Inj 1000 Unit/ML: INTRAMUSCULAR | Qty: 30 | Status: CN

## 2019-12-06 MED FILL — Heparin Sodium (Porcine) Inj 1000 Unit/ML: INTRAMUSCULAR | Qty: 2500 | Status: CN

## 2019-12-06 MED FILL — Potassium Chloride Inj 2 mEq/ML: INTRAVENOUS | Qty: 40 | Status: CN

## 2019-12-06 NOTE — Progress Notes (Signed)
Pt answers "no" to the question "are your pupils equal to each other at home?" Pt answers "Yes" to the question, "are your pupils different- even at home?"

## 2019-12-06 NOTE — Progress Notes (Signed)
0030- On call physician called at this time by care nurse for hypotension, BP 80/50 map in low 60's. Discussed Hemocalcs, I/O, and current vitals. Dr. Jonne Ply stated he would come eval patient in person.  59- Dr. Kalman Shan at bedside. New order to decrease Amio drip, decrease levo drip to 50 mcg, and give 241ml bolus of LR.  0140-Notified physician of completed bolus, updated on current vitals/Hemocalcs. New order to given second 250 ml bolus of LR.  0215- Dr. Kalman Shan notified of completed bolus. At bedside to eval patient. Wedge and completed hemo calcs per order at this time. New order for 530ml bolus.  59- Called Dr. Kalman Shan with hemocalcs after bolus completed. Dr. Kalman Shan stated that he was okay with current BP (map from art line in upper 50's lower 60's, SBP in 80's) if other hemodynamics and VS are stable.   Current calcs & VS:  CI 2.44 CO 5.34 Pa 32/13 Wedge 11 CVP 8 Bp 90/51 map 63

## 2019-12-06 NOTE — Progress Notes (Addendum)
Inpatient Diabetes Program Recommendations  AACE/ADA: New Consensus Statement on Inpatient Glycemic Control (2015)  Target Ranges:  Prepandial:   less than 140 mg/dL      Peak postprandial:   less than 180 mg/dL (1-2 hours)      Critically ill patients:  140 - 180 mg/dL   Lab Results  Component Value Date   GLUCAP 293 (H) 12/06/2019   HGBA1C 7.8 (H) 12/03/2019    Review of Glycemic Control Results for Casey [name], Casey [name] (MRN [id number]) as of 12/06/2019 11:46  Ref. Range 12/05/2019 20:06 12/05/2019 20:40 12/06/2019 00:38 12/06/2019 04:26 12/06/2019 08:04  Glucose-Capillary Latest Ref Range: 70 - 99 mg/dL 295 (H) 298 (H) 366 (H) 329 (H) 293 (H)   Diabetes history: DM 2 Outpatient Diabetes medications: Glucotrol XL 10 mg q AM, Novolog 0-9 units tid Current orders for Inpatient glycemic control:  Levemir 10 units daily, Novolog sensitive q 4 hours  Inpatient Diabetes Program Recommendations:    Note blood sugars>goal.  Patient extubated.  Agree with start of basal insulin.  May need to increase Levemir to 10 units bid.   Thanks,  Adah Perl, RN, BC-ADM Inpatient Diabetes Coordinator Pager [phone] (8a-5p)

## 2019-12-06 NOTE — Progress Notes (Signed)
[facility] Progress Note    Assessment/ Plan:   1. Acute kidney Injury (oliguric) secondary to cardiogenic shock and diminished perfusion from hypotension (ATN picture) with possible contrast induced injury -at this junction, there is no absolute indication for renal replacement therapy. His volume status/lack of response to diuretics would have me favor starting CRRT sooner rather than later -maintain MAP >65 to ensure adequate perfusion -Avoid nephrotoxic medications including NSAIDs and iodinated intravenous contrast exposure unless the latter is absolutely indicated.  Preferred narcotic agents for pain control are hydromorphone, fentanyl, and methadone. Morphine should not be used. Avoid Baclofen and avoid oral sodium phosphate and magnesium citrate based laxatives / bowel preps. Continue strict Input and Output monitoring. 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 Marval Regal), underlying disease=HTN and DM. Continue with tacrolimus and home prednisone dose. 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 If needed can use stress dose steroids if persistently hypotensive. Baseline creatinine ~1.3. Tacrolimus trough pending, sent 10/19 (goal tacrolimus trough 5-7). Transplant ultrasound reviewed 3. AGMA, likely related to AKI, if worsening repeat lactate levels. Can start sodium bicarb 1300mg  TID if no improvement 4. Shock, cardiogenic +/- septic shock: on zosyn, receiving support with levophed, vaso, and milrinone 5. Ischemic cardiomyopathy, EF 20-25%. Diurese as needed. Not a candidate for impella given LV thrombus 6. Anterior STEMI, s/p LHC 10/17 7. LV thrombus: on hep gtt 8. Hyponatremia: corrected Na 135 9. Abdominal pain: ileus? Will be having a noncon ct today 10. Leukocytosis: on zosyn   Subjective:   Still has minimal urine output,  creatinine up to 4.5. Was found to have unequal pupils postanesthesia yesterday however patient reported to the nurse that apparently this is chronic (CT head negative). Started on Zosyn.   Objective:   BP (!) 143/82   Pulse 82   Temp 98.4 F (36.9 C)   Resp 19   Wt 96.1 kg   SpO2 100%   BMI 27.95 kg/m   Intake/Output Summary (Last 24 hours) at 12/06/2019 1032 Last data filed at 12/06/2019 1000 Gross per 24 hour  Intake 2955.81 ml  Output 185 ml  Net 2770.81 ml   Weight change:   Physical Exam: Gen:nad, intubated CVS:s1s2, no m/r/g, rrr Resp:cta bl, intubated, bl chest expansion LYY:[id number], nontender, soft Ext:no edema Neuro: awake, alert, following commands, answering questions  Imaging: DG Abd 1 View  Result Date: 12/05/2019 CLINICAL DATA:  Abdominal distension.  Admitted with STEMI. EXAM: ABDOMEN - 1 VIEW COMPARISON:  05/19/2015 FINDINGS: Diffuse gas dilated bowel primarily affecting stomach and small bowel with gas seen to the level of the ileum. Proximal colon is distended with remaining colon not well visualized. No rectal impaction or colonic volvulus appearance. No concerning mass effect or evidence of extraluminal gas, although supine. IMPRESSION: Diffuse gas dilated bowel. Although the distal colon is less affected, ileus is still favored. Electronically Signed   By: Monte Fantasia M.D.   On: 12/05/2019 09:32   US Renal Transplant w/Doppler  Result Date: 12/06/2019 CLINICAL DATA:  59 year old male with acute renal insufficiency. Right lower quadrant transplant kidney. EXAM: ULTRASOUND OF RENAL TRANSPLANT WITH RENAL DOPPLER ULTRASOUND TECHNIQUE: Ultrasound examination of the renal transplant was performed with gray-scale, color and duplex doppler evaluation. COMPARISON:  Ultrasound dated 07/05/2017. FINDINGS: Transplant kidney location: Right lower quadrant Transplant Kidney: Renal measurements: 11.3 x 8.2 x 6.3 = volume: 376mL. Normal  in size and parenchymal  echogenicity. No evidence of mass or hydronephrosis. No peri-transplant fluid collection seen. Color flow in the main renal artery:  Yes Color flow in the main renal vein:  Yes Duplex Doppler Evaluation: Main Renal Artery Velocity: 99 cm/sec Main Renal Artery Resistive Index: 0.79 (previously 0.79). Venous waveform in main renal vein:  Present Intrarenal resistive index in upper pole:  0.83 (previously 0.64) (normal 0.6-0.8; equivocal 0.8-0.9; abnormal >= 0.9) Intrarenal resistive index in lower pole: 0.69 (previously 0.66) (normal 0.6-0.8; equivocal 0.8-0.9; abnormal >= 0.9) Bladder: Decompressed around a Foley catheter. Other findings:  None. IMPRESSION: 1. No hydronephrosis or peritransplant collection. 2. Borderline elevated resistive index in the upper pole of the transplant kidney. 3. The transplant main renal artery and vein are patent. Electronically Signed   By: Anner Crete M.D.   On: 12/06/2019 02:08   CARDIAC CATHETERIZATION  Result Date: 12/05/2019 1. Filling pressures normal to low. 2. Cardiac output is low at 2.06 by Fick and thermodilution on milrinone 0.25 mcg/kg/min. Discussed findings with Dr. Prescott Gum.  Patient is tachycardic and mildly hypotension with SBP upper 80s/90s on milrinone 0.25 mcg/kg/min.  While his cardiac output on milrinone is not markedly low, his hemodynamics are marginal.  He is on inotrope now s/p large MI with residual severe coronary disease.  We decided that the best plan for him would be Impella 5.5 to allow unloading of LV and to limit the need for inotropes/pressors. He has an ileus, will cover with Zosyn IV for now and will get CT abdomen/pelvis at some point post-Impella.   Port CXR  Result Date: 12/06/2019 CLINICAL DATA:  Intubation.  Swan-Ganz catheter EXAM: PORTABLE CHEST 1 VIEW COMPARISON:  12/05/2019. FINDINGS: Endotracheal tube, NG tube, Swan-Ganz catheter in stable position. Stable cardiomegaly. No pulmonary venous congestion. Low lung volumes with  basilar atelectasis. No focal infiltrate. No pleural effusion or pneumothorax. Left subclavian stents again noted. These are in an angled position and appear unchanged from 12/05/1999. IMPRESSION: 1. Lines and tubes in stable position. 2. Stable cardiomegaly. 3. Low lung volumes with bibasilar atelectasis. Electronically Signed   By: Marcello Moores  Register   On: 12/06/2019 07:21   DG Chest Port 1 View  Result Date: 12/05/2019 CLINICAL DATA:  STEMI EXAM: PORTABLE CHEST 1 VIEW COMPARISON:  12/03/2019 FINDINGS: Swan-Ganz catheter is in the central right pulmonary artery. NG tube is in the stomach. Endotracheal tube is 5 cm above the carina. Heart is normal size. Lungs clear. No effusions or pneumothorax. IMPRESSION: Support devices in expected position as above. No acute cardiopulmonary disease. Electronically Signed   By: Rolm Baptise M.D.   On: 12/05/2019 19:16   ECHOCARDIOGRAM COMPLETE  Result Date: 12/04/2019    ECHOCARDIOGRAM REPORT   Patient Name:   Casey [name]. Date of Exam: 12/04/2019 Medical Rec #:  [id number]           Height:       73.0 in Accession #:    [id number]          Weight:       203.9 lb Date of Birth:  04-17-1960            BSA:          2.169 m Patient Age:    7 years            BP:           92/75 mmHg Patient Gender: M  HR:           90 bpm. Exam Location:  [facility] Procedure: 2D Echo, Color Doppler, Cardiac Doppler and Intracardiac            Opacification Agent Indications:     Acute myocardial infarction  History:         Patient has no prior history of Echocardiogram examinations.                  CKD; Risk Factors:Hypertension and Diabetes.  Sonographer:     Charmayne Sheer RDCS (AE) Referring Phys:  [place] Diagnosing Phys: Kathlyn Sacramento MD  Sonographer Comments: No subcostal window. IMPRESSIONS  1. Echo contrast was given. There is heavy smoke in the apex with possible early thrombus formation. . Left ventricular ejection fraction, by estimation, is 20 to  25%. The left ventricle has severely decreased function. The left ventricle demonstrates regional wall motion abnormalities (see scoring diagram/findings for description). The left ventricular internal cavity size was mildly dilated. There is mild left ventricular hypertrophy. Left ventricular diastolic parameters are consistent with Grade I  diastolic dysfunction (impaired relaxation). There is akinesis of the left ventricular, mid-apical anteroseptal wall, anterolateral wall and anterior segment. There is dyskinesis of the left ventricular, apical segment.  2. Right ventricular systolic function is normal. The right ventricular size is normal.  3. The mitral valve is normal in structure. No evidence of mitral valve regurgitation. No evidence of mitral stenosis.  4. The aortic valve is normal in structure. Aortic valve regurgitation is not visualized. Mild aortic valve sclerosis is present, with no evidence of aortic valve stenosis.  5. The inferior vena cava is normal in size with greater than 50% respiratory variability, suggesting right atrial pressure of 3 mmHg. FINDINGS  Left Ventricle: Echo contrast was given. There is heavy smoke in the apex with possible early thrombus formation. Left ventricular ejection fraction, by estimation, is 20 to 25%. The left ventricle has severely decreased function. The left ventricle demonstrates regional wall motion abnormalities. Definity contrast agent was given IV to delineate the left ventricular endocardial borders. The left ventricular internal cavity size was mildly dilated. There is mild left ventricular hypertrophy. Left ventricular diastolic parameters are consistent with Grade I diastolic dysfunction (impaired relaxation). Right Ventricle: The right ventricular size is normal. No increase in right ventricular wall thickness. Right ventricular systolic function is normal. Left Atrium: Left atrial size was normal in size. Right Atrium: Right atrial size was normal in  size. Pericardium: There is no evidence of pericardial effusion. Mitral Valve: The mitral valve is normal in structure. There is mild calcification of the mitral valve leaflet(s). Mild mitral annular calcification. No evidence of mitral valve regurgitation. No evidence of mitral valve stenosis. Tricuspid Valve: The tricuspid valve is normal in structure. Tricuspid valve regurgitation is not demonstrated. No evidence of tricuspid stenosis. Aortic Valve: The aortic valve is normal in structure. Aortic valve regurgitation is not visualized. Mild aortic valve sclerosis is present, with no evidence of aortic valve stenosis. Aortic valve mean gradient measures 3.0 mmHg. Aortic valve peak gradient measures 5.9 mmHg. Aortic valve area, by VTI measures 1.97 cm. Pulmonic Valve: The pulmonic valve was normal in structure. Pulmonic valve regurgitation is not visualized. No evidence of pulmonic stenosis. Aorta: The aortic root is normal in size and structure. Venous: The inferior vena cava is normal in size with greater than 50% respiratory variability, suggesting right atrial pressure of 3 mmHg. IAS/Shunts: No atrial level shunt detected  by color flow Doppler.  LEFT VENTRICLE PLAX 2D LVIDd:         4.27 cm  Diastology LVIDs:         3.51 cm  LV e' medial:    3.92 cm/s LV PW:         1.38 cm  LV E/e' medial:  12.9 LV IVS:        0.98 cm  LV e' lateral:   4.68 cm/s LVOT diam:     1.80 cm  LV E/e' lateral: 10.8 LV SV:         31 LV SV Index:   14 LVOT Area:     2.54 cm  RIGHT VENTRICLE RV Basal diam:  2.35 cm LEFT ATRIUM             Index       RIGHT ATRIUM          Index LA diam:        2.80 cm 1.29 cm/m  RA Area:     9.31 cm LA Vol (A2C):   18.6 ml 8.57 ml/m  RA Volume:   15.80 ml 7.28 ml/m LA Vol (A4C):   29.8 ml 13.74 ml/m LA Biplane Vol: 25.0 ml 11.52 ml/m  AORTIC VALVE                   PULMONIC VALVE AV Area (Vmax):    1.72 cm    PV Vmax:       1.00 m/s AV Area (Vmean):   1.76 cm    PV Vmean:      68.800 cm/s AV  Area (VTI):     1.97 cm    PV VTI:        0.136 m AV Vmax:           121.00 cm/s PV Peak grad:  4.0 mmHg AV Vmean:          83.200 cm/s PV Mean grad:  2.0 mmHg AV VTI:            0.155 m AV Peak Grad:      5.9 mmHg AV Mean Grad:      3.0 mmHg LVOT Vmax:         81.60 cm/s LVOT Vmean:        57.600 cm/s LVOT VTI:          0.120 m LVOT/AV VTI ratio: 0.77  AORTA Ao Root diam: 2.90 cm MITRAL VALVE MV Area (PHT): 5.20 cm    SHUNTS MV Decel Time: 146 msec    Systemic VTI:  0.12 m MV E velocity: 50.60 cm/s  Systemic Diam: 1.80 cm MV A velocity: 63.80 cm/s MV E/A ratio:  0.79 Kathlyn Sacramento MD Electronically signed by Kathlyn Sacramento MD Signature Date/Time: 12/04/2019/12:50:37 PM    Final    ECHO INTRAOPERATIVE TEE  Result Date: 12/06/2019  *INTRAOPERATIVE TRANSESOPHAGEAL REPORT *  Patient Name:   Perseus Westall. Date of Exam: 12/05/2019 Medical Rec #:  [id number]           Height:       73.0 in Accession #:    [id number]          Weight:       208.3 lb Date of Birth:  1960/10/23            BSA:          2.19 m Patient Age:    59 years  BP:           106/89 mmHg Patient Gender: M                   HR:           120 bpm. Exam Location:  Inpatient Transesophogeal exam was perform intraoperatively during surgical procedure. Patient was closely monitored under general anesthesia during the entirety of examination. Indications:     impella placement Performing Phys: Economy TRIGT Diagnosing Phys: Belenda Cruise Stoltzfus Complications: No known complications during this procedure. SUMMARY  Diffuse smoke in LV making elucidation of endocardial borders difficult (definity contrast used in pre-op study). Early hyperechoic poorly organized likely apical thrombus noted. Surgeon aware and cardiology reviewed intraop images with agreement of likely findings. Planned placement of impella device aborted at that time.  PRE-OP FINDINGS  Left Ventricle: The left ventricle has severely reduced systolic function, with an  ejection fraction of 20-25%. The cavity size was mildly dilated. Left ventrical global hypokinesis with regonal wall motion abnormalities. Left ventricular diffuse hypokinesis. There is a small, mobile, apical left ventricular thrombus. The thrombus appears irregular in shape and hyperechoic in texture. Right Ventricle: The right ventricle has normal systolic function. The cavity was normal. There is no increase in right ventricular wall thickness. Right ventricular systolic pressure is normal. Left Atrium: Left atrial size was normal in size. The left atrial appendage is well visualized and there is no evidence of thrombus present. Left atrial appendage velocity is normal at greater than 40 cm/s. Right Atrium: Right atrial size was normal in size. Interatrial Septum: No atrial level shunt detected by color flow Doppler. Pericardium: A small pericardial effusion is present. The pericardial effusion is circumferential. There is no pleural effusion. Mitral Valve: The mitral valve is normal in structure. Mitral valve regurgitation is not visualized by color flow Doppler. There is no evidence of mitral valve vegetation. There is No evidence of mitral stenosis. Tricuspid Valve: The tricuspid valve was normal in structure. Tricuspid valve regurgitation is trivial by color flow Doppler. The jet is directed centrally. There is no evidence of tricuspid valve vegetation. Aortic Valve: The aortic valve is tricuspid Aortic valve regurgitation was not visualized by color flow Doppler. There is no stenosis of the aortic valve, with a calculated valve area of 2.86 cm. There is no evidence of a vegetation on the aortic valve. Pulmonic Valve: The pulmonic valve was normal in structure No evidence of pumonic stenosis. Pulmonic valve regurgitation is not visualized by color flow Doppler. Aorta: The ascending aorta are normal in size and structure. Pulmonary Artery: The pulmonary artery is of normal size. Venous: The inferior vena  cava is normal in size with greater than 50% respiratory variability, suggesting right atrial pressure of 3 mmHg. Shunts: No residual ventricular septal defect is detected by 2D or color Doppler. +--------------+--------++ LEFT VENTRICLE         +----------------+---------++ +--------------+--------++ Diastology                PLAX 2D                +----------------+---------++ +--------------+--------++ LV e' lateral:  7.31 cm/s LVIDd:        4.12 cm  +----------------+---------++ +--------------+--------++ LV E/e' lateral:5.0       LVIDs:        3.76 cm  +----------------+---------++ +--------------+--------++ LV e' medial:   7.51 cm/s LV PW:        1.01 cm  +----------------+---------++ +--------------+--------++ LV  E/e' medial: 4.9       LV IVS:       1.34 cm  +----------------+---------++ +--------------+--------++ LVOT diam:    1.90 cm  +--------------+--------++ LV SV:        15 ml    +--------------+--------++ LV SV Index:  6.61     +--------------+--------++ LVOT Area:    2.84 cm +--------------+--------++                        +--------------+--------++ +------------------+-----------++ AORTIC VALVE                  +------------------+-----------++ AV Area (Vmax):   2.38 cm    +------------------+-----------++ AV Area (Vmean):  2.49 cm    +------------------+-----------++ AV Area (VTI):    2.86 cm    +------------------+-----------++ AV Vmax:          131.00 cm/s +------------------+-----------++ AV Vmean:         86.900 cm/s +------------------+-----------++ AV VTI:           0.142 m     +------------------+-----------++ AV Peak Grad:     6.9 mmHg    +------------------+-----------++ AV Mean Grad:     4.0 mmHg    +------------------+-----------++ LVOT Vmax:        110.00 cm/s +------------------+-----------++ LVOT Vmean:       76.200 cm/s +------------------+-----------++ LVOT VTI:          0.143 m     +------------------+-----------++ LVOT/AV VTI ratio:1.01        +------------------+-----------++  +--------------+-------++ AORTA                 +--------------+-------++ Ao Sinus diam:2.88 cm +--------------+-------++ Ao STJ diam:  2.4 cm  +--------------+-------++ Ao Asc diam:  2.74 cm +--------------+-------++ +-------------+---------++ MITRAL VALVE             +--------------+-------+ +-------------+---------++   SHUNTS                MV Peak grad:2.5 mmHg    +--------------+-------+ +-------------+---------++   Systemic VTI: 0.14 m  MV Mean grad:1.0 mmHg    +--------------+-------+ +-------------+---------++   Systemic Diam:1.90 cm MV Vmax:     0.78 m/s    +--------------+-------+ +-------------+---------++ MV Vmean:    51.5 cm/s +-------------+---------++ MV VTI:      0.12 m    +-------------+---------++ +--------------+----------++ MV E velocity:36.80 cm/s +--------------+----------++ MV A velocity:63.40 cm/s +--------------+----------++ MV E/A ratio: 0.58       +--------------+----------++  Rochele Pages Electronically signed by Rochele Pages Signature Date/Time: 12/06/2019/10:31:01 AM    Final    CT HEAD CODE STROKE WO CONTRAST  Result Date: 12/05/2019 CLINICAL DATA:  Code stroke.  59 year old male with unequal pupils. EXAM: CT HEAD WITHOUT CONTRAST TECHNIQUE: Contiguous axial images were obtained from the base of the skull through the vertex without intravenous contrast. COMPARISON:  Head CT 03/15/2013. FINDINGS: Brain: Stable cerebral volume since 2015, within normal limits for age. Age indeterminate hypodensities in the lateral right thalamus and the right caudate (series 3, image 16), new from 60. Gray-white matter differentiation elsewhere is stable and within normal limits. No midline shift, ventriculomegaly, mass effect, evidence of mass lesion, intracranial hemorrhage or evidence of cortically based  acute infarction. Vascular: Calcified atherosclerosis at the skull base. No suspicious intracranial vascular hyperdensity. Skull: Stable, negative. Sinuses/Orbits: Visualized paranasal sinuses and mastoids are clear. Other: Visualized orbits and scalp soft tissues are within normal limits. ASPECTS [facility] Stroke Program Early CT Score) Total score (0-10  with 10 being normal): 10 IMPRESSION: 1. Age indeterminate lacunar infarcts in the right thalamus and right caudate, new since 2015. 2. No acute cortically based infarct or intracranial hemorrhage identified. ASPECTS 10. 3. These results were communicated to Dr. Erlinda Hong At 7:25 pm on 12/05/2019 by text page via the [facility] messaging system. Electronically Signed   By: Genevie Ann M.D.   On: 12/05/2019 19:25    Labs: BMET Recent Labs  Lab 12/03/19 1956 12/03/19 1956 12/04/19 0034 12/04/19 1245 12/05/19 0725 12/05/19 1548 12/05/19 1549 12/05/19 1714 12/06/19 0246  NA 136   < > 134* 135 132* 135 133* 135 131*  K 4.7   < > 4.8 4.9 4.6 4.1 4.3 4.3 4.3  CL 100  --  102 103 99  --   --   --  100  CO2 25  --  22 21* 19*  --   --   --  18*  GLUCOSE 221*  --  228* 207* 244*  --   --   --  354*  BUN 18  --  20 30* 53*  --   --   --  70*  CREATININE 1.46*  --  1.53* 1.86* 3.10*  --   --   --  4.48*  CALCIUM 10.3  --  9.5 9.7 9.8  --   --   --  8.6*  PHOS  --   --  2.9  --   --   --   --   --   --    < > = values in this interval not displayed.   CBC Recent Labs  Lab 12/03/19 1956 12/03/19 1956 12/04/19 0035 12/04/19 0035 12/05/19 0725 12/05/19 0725 12/05/19 1548 12/05/19 1549 12/05/19 1714 12/06/19 0246  WBC 23.9*  --  22.9*  --  24.0*  --   --   --   --  17.9*  NEUTROABS  --   --  18.5*  --   --   --   --   --   --   --   HGB 17.4*   < > 15.3   < > 14.4   < > 13.9 14.3 14.6 11.5*  HCT 52.1*   < > 46.1   < > 43.1   < > 41.0 42.0 43.0 35.1*  MCV 84.2  --  83.4  --  83.0  --   --   --   --  85.2  PLT 303  --  314  --  316  --   --   --   --  346    < > = values in this interval not displayed.    Medications:    . aspirin  81 mg Per Tube Daily  . Chlorhexidine Gluconate Cloth  6 each Topical Daily  . insulin aspart  0-9 Units Subcutaneous Q4H  . insulin detemir  10 Units Subcutaneous Daily  . metoCLOPramide (REGLAN) injection  5 mg Intravenous Once  . pantoprazole (PROTONIX) IV  40 mg Intravenous Q24H  . predniSONE  5 mg Per Tube Q breakfast  . rosuvastatin  40 mg Per Tube Daily  . sodium chloride flush  3 mL Intravenous Q12H  . tacrolimus  3 mg Per Tube BID  . ticagrelor  90 mg Per Tube BID      Gean Quint, MD [facility] 12/06/2019, 10:32 AM

## 2019-12-06 NOTE — Procedures (Signed)
Extubation Procedure Note  Patient Details:   Name: Casey [name]. DOB: [birth year] MRN: [id number]   Airway Documentation:    Vent end date: 12/06/19 Vent end time: 0902   Evaluation  O2 sats: stable throughout Complications: No apparent complications Patient did tolerate procedure well. Bilateral Breath Sounds: Clear, Diminished   Yes   Patient extubated per order to 2L  with no apparent complications. Pt was already on wean PS/CPAP when RT entered room. Positive cuff leak was noted prior to extubation. Patient is alert and oriented and is able to speak. Pt has strong cough. Vitals are stable. RT will continue to monitor.   Arda Keadle Clyda Greener 12/06/2019, 9:06 AM

## 2019-12-06 NOTE — Progress Notes (Signed)
NAME:  Casey Darko., MRN:  [id number], DOB:  November 11, 1960, LOS: 1 ADMISSION DATE:  12/05/2019, CONSULTATION DATE:  12/05/19 REFERRING MD:  Loralie Champagne, MD CHIEF COMPLAINT:  Vent and CCM management  Brief History   59 year old male initially admitted to [facility] for anterior STEMI. Underwent cardiac cath and had DES to LAD complicated by post-cath cardiogenic shock and acute on chronic kidney injury. He was transferred to [facility] for intervention. On arrival he was hypotensive with SBP in 80s. He went to the cath lab for Swan-Ganz and then TEE in the OR. Initially planned for impella however procedure cancelled on finding an LV thrombus on TEE. On return to ICU room he remained in cardiogenic shock on multiple pressors. He was also noted to have unequal pupils which prompted code stroke. Patient remained intubated post-op due to critical condition. PCCM consulted for assistance  History of present illness   As above  Past Medical History  S/p renal transplant in 2011, HTN, DM@, HLD, chronic systolic heart failure  Significant Hospital Events   10/19 Admitted to [facility]  Consults:  PCCM Neuro  Procedures:  10/17 Cardiac cath with DES to LAD 10/19 Swan ganz placment, TEE  Significant Diagnostic Tests:  CT Head Stroke 10/19 - Age indeterminate lacunar infarcts in the right thalamus and right caudate  Micro Data:    Antimicrobials:    Interim history/subjective:  Tolerating PS. Following commands. +flatus  Objective   Blood pressure 139/68, pulse 84, temperature 98.6 F (37 C), resp. rate 19, weight 96.1 kg, SpO2 100 %. PAP: (20-38)/(5-22) 31/15 CVP:  [5 mmHg-20 mmHg] 20 mmHg PCWP:  [10 mmHg-11 mmHg] 11 mmHg CO:  [5.3 L/min-5.8 L/min] 5.3 L/min CI:  [2.4 L/min/m2-2.7 L/min/m2] 2.4 L/min/m2  Vent Mode: SIMV;PRVC;PSV FiO2 (%):  [50 %] 50 % Set Rate:  [12 bmp] 12 bmp Vt Set:  [630 mL] 630 mL PEEP:  [5 cmH20] 5 cmH20 Pressure Support:  [10 cmH20] 10 cmH20 Plateau  Pressure:  [17 cmH20-21 cmH20] 17 cmH20   Intake/Output Summary (Last 24 hours) at 12/06/2019 0816 Last data filed at 12/06/2019 0600 Gross per 24 hour  Intake 2574.63 ml  Output 110 ml  Net 2464.63 ml   Filed Weights   12/06/19 0430  Weight: 96.1 kg   Physical Exam: General: Well-appearing, no acute distress HENT: [place], AT, ETT in place Eyes: Aniscoria L pupil 28mm, R pupil 27mm minimally responsive on sedation, no scleral icterus Respiratory: Clear to auscultation bilaterally.  No crackles, wheezing or rales Cardiovascular: RRR, -M/R/G, no JVD GI: Hypoactive BS+, soft, nontender Extremities:-Edema,-tenderness Neuro: Awake alert and following commands, 5/5 strength in extremities x 4 Skin: Intact, no rashes or bruising GU: Foley in place  Resolved Hospital Problem list     Assessment & Plan:   Acute encephalopathy, unclear - Resolved Age-indeterminate lacunar infarcts in right thalamus and caudate - no focal deficit --Neuro following --DC sedation  Acute hypoxemic respiratory failure for airway protection --Tolerating SBT --Extubate --IS  Cardiogenic shock: Not a candidate for impella or IABP due to thrombus Anterior STEMI s/p PCI and DES to LAD EF 15-20% --Continue levophed, vaso and milrinone --DC neo gtt --MAP goal >65 --Daily Co-ox --Continue ASA and Brilinta --Obtain blood cultures --Trend troponin 27K>27K  LV thrombus --Continue heparin   Atrial fibrillation --Amio gtt --Holding heparin  Ileus --Hold TF --Primary team started Zosyn and CT Abdomen/Pelvis  S/p renal transplant AoCKD IV Renal transplant doppler with patent artery and vein --Continue prednisone  and prograf. Will discuss with renal --Obtain prograf level --Monitor UOP/Cr, foley in place --Appreciate Nephrology input. May need dialysis  DM2 --CBG q4h --Lantus --SSI  Best practice:  Diet: NPO Pain/Anxiety/Delirium protocol (if indicated): As above VAP protocol (if indicated):  Yes DVT prophylaxis: Heparin GI prophylaxis: PPI Glucose control: CBG q4h Mobility: BR Code Status: Full Family Communication: Per primary Disposition: Remain in ICU  Labs   CBC: Recent Labs  Lab 12/03/19 1956 12/03/19 1956 12/04/19 0035 12/04/19 0035 12/05/19 0725 12/05/19 1548 12/05/19 1549 12/05/19 1714 12/06/19 0246  WBC 23.9*  --  22.9*  --  24.0*  --   --   --  17.9*  NEUTROABS  --   --  18.5*  --   --   --   --   --   --   HGB 17.4*   < > 15.3   < > 14.4 13.9 14.3 14.6 11.5*  HCT 52.1*   < > 46.1   < > 43.1 41.0 42.0 43.0 35.1*  MCV 84.2  --  83.4  --  83.0  --   --   --  85.2  PLT 303  --  314  --  316  --   --   --  346   < > = values in this interval not displayed.    Basic Metabolic Panel: Recent Labs  Lab 12/03/19 1956 12/03/19 1956 12/04/19 0034 12/04/19 0034 12/04/19 1245 12/04/19 1245 12/05/19 0725 12/05/19 1548 12/05/19 1549 12/05/19 1714 12/06/19 0246  NA 136   < > 134*   < > 135   < > 132* 135 133* 135 131*  K 4.7   < > 4.8   < > 4.9   < > 4.6 4.1 4.3 4.3 4.3  CL 100  --  102  --  103  --  99  --   --   --  100  CO2 25  --  22  --  21*  --  19*  --   --   --  18*  GLUCOSE 221*  --  228*  --  207*  --  244*  --   --   --  354*  BUN 18  --  20  --  30*  --  53*  --   --   --  70*  CREATININE 1.46*  --  1.53*  --  1.86*  --  3.10*  --   --   --  4.48*  CALCIUM 10.3  --  9.5  --  9.7  --  9.8  --   --   --  8.6*  MG  --   --  2.1  --   --   --   --   --   --   --   --   PHOS  --   --  2.9  --   --   --   --   --   --   --   --    < > = values in this interval not displayed.   GFR: Estimated Creatinine Clearance: 21.7 mL/min (A) (by C-G formula based on SCr of 4.48 mg/dL (H)). Recent Labs  Lab 12/03/19 1956 12/04/19 0035 12/05/19 0725 12/05/19 1453 12/05/19 1955 12/06/19 0246  WBC 23.9* 22.9* 24.0*  --   --  17.9*  LATICACIDVEN  --   --   --  1.3 1.4  --     Liver Function  Tests: Recent Labs  Lab 12/03/19 1956 12/04/19 0034  12/05/19 0725 12/06/19 0246  AST 267* 245* 79* 52*  ALT 82* 67* 45* 31  ALKPHOS 89 75 78 79  BILITOT 1.4* 1.2 1.2 0.6  PROT 8.5* 6.7 7.1 5.8*  ALBUMIN 4.4 3.6 3.2* 2.4*   No results for input(s): LIPASE, AMYLASE in the last 168 hours. No results for input(s): AMMONIA in the last 168 hours.  ABG    Component Value Date/Time   PHART 7.351 12/05/2019 1714   PCO2ART 36.6 12/05/2019 1714   PO2ART 142 (H) 12/05/2019 1714   HCO3 20.2 12/05/2019 1714   TCO2 21 (L) 12/05/2019 1714   ACIDBASEDEF 5.0 (H) 12/05/2019 1714   O2SAT 72.7 12/06/2019 0247     Coagulation Profile: Recent Labs  Lab 12/03/19 1956 12/05/19 1955  INR 1.1 1.3*    Cardiac Enzymes: Recent Labs  Lab 12/03/19 2233  CKTOTAL 2,183*    HbA1C: Hgb A1c MFr Bld  Date/Time Value Ref Range Status  12/03/2019 07:56 PM 7.8 (H) 4.8 - 5.6 % Final    Comment:    (NOTE)         Prediabetes: 5.7 - 6.4         Diabetes: >6.4         Glycemic control for adults with diabetes: <7.0     CBG: Recent Labs  Lab 12/05/19 2006 12/05/19 2040 12/06/19 0038 12/06/19 0426 12/06/19 0804  GLUCAP 295* 298* 366* 329* 293*   Critical care time: 41 min    The patient is critically ill with multiple organ systems failure and requires high complexity decision making for assessment and support, frequent evaluation and titration of therapies, application of advanced monitoring technologies and extensive interpretation of multiple databases.  Independent Critical Care Time: 41 Minutes.   Rodman Pickle, M.D. [facility] Pulmonary/Critical Care Medicine 12/06/2019 8:16 AM   Please see Amion for pager number to reach on-call Pulmonary and Critical Care Team.
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
## 2019-12-06 NOTE — Progress Notes (Signed)
ANTICOAGULATION CONSULT NOTE  Pharmacy Consult for heparin Indication: apical thrombus  No Known Allergies  Patient Measurements: Weight: 96.1 kg (211 lb 13.8 oz) Heparin Dosing Weight: 92 kg  Vital Signs: Temp: 99 F (37.2 C) (10/20 1530) BP: 143/54 (10/20 1530) Pulse Rate: 90 (10/20 1530)  Labs: Recent Labs    12/03/19 1956 12/03/19 1956 12/03/19 2233 12/04/19 0034 12/04/19 0034 12/04/19 0035 12/04/19 1245 12/04/19 2140 12/05/19 0725 12/05/19 1342 12/05/19 1548 12/05/19 1549 12/05/19 1549 12/05/19 1714 12/05/19 1955 12/06/19 0246 12/06/19 0858  HGB 17.4*   < >  --   --    < > 15.3  --   --  14.4  --    < > 14.3   < > 14.6  --  11.5*  --   HCT 52.1*   < >  --   --    < > 46.1  --   --  43.1  --    < > 42.0  --  43.0  --  35.1*  --   PLT 303   < >  --   --   --  314  --   --  316  --   --   --   --   --   --  346  --   APTT 34  --   --   --   --   --   --   --   --   --   --   --   --   --   --   --   --   LABPROT 13.8  --   --   --   --   --   --   --   --   --   --   --   --   --  15.3*  --   --   INR 1.1  --   --   --   --   --   --   --   --   --   --   --   --   --  1.3*  --   --   HEPARINUNFRC  --   --   --   --   --   --   --    < > 0.42  --   --   --   --   --   --  <0.10* 0.18*  CREATININE 1.46*   < >  --  1.53*   < >  --  1.86*  --  3.10*  --   --   --   --   --   --  4.48*  --   CKTOTAL  --   --  2,183*  --   --   --   --   --   --   --   --   --   --   --   --   --   --   TROPONINIHS >27,000*   < > >27,000* >27,000*  --   --   --   --   --  >27,000*  --   --   --   --  >27,000*  --   --    < > = values in this interval not displayed.    Estimated Creatinine Clearance: 21.7 mL/min (A) (by C-G formula based on SCr of 4.48 mg/dL (H)).   Medical History: Past Medical History:  Diagnosis Date  . Chronic kidney disease 04/2009   Kidney Transplant  . Diabetes mellitus   . GERD (gastroesophageal reflux disease)    as needed reflux  . Hypertension       Assessment: 59 year old male presented with STEMI s/p PCI to mid LAD. Pt noted to have apical smoke on ECHO concerning for early thrombus. Pharmacy asked to continue heparin for now upon  transfer from [facility]. 10/19 Concern for stroke post RHC d/t unequal pupil size - per patient this is old, head CT negative for bleeding. Heparin to be restarted at prior rate. LV thrombus noted on echo, impella cancelled.   Heparin drip rate 1400 uts/hr HL 0.18 < goal, no bleeding noted, h/h stable   Goal of Therapy:  Heparin level 0.3-0.7 units/ml Monitor platelets by anticoagulation protocol: Yes   Plan:  Increase heparin drip rate 1700 units/h -Daily heparin level and CBC   Bonnita Nasuti Pharm.D. CPP, BCPS Clinical Pharmacist [phone] 12/06/2019 3:46 PM

## 2019-12-06 NOTE — Progress Notes (Signed)
Patient ID: Casey Mages., male   DOB: 03-14-1960, 59 y.o.   MRN: [id number]     Advanced Heart Failure Rounding Note  PCP-Cardiologist: No primary care provider on file.   Subjective:    Patient taken on OR 10/19 for Impella 5.5, unable to place due to sludge/early thrombus present at LV apex.  Subsequently returned [place], developed AF/RVR and started on amiodarone.  Fall in MAP, now on milrinone 0.25 + NE 40 + vasopressin 0.04.  MAP currently excellent in 80s.    Remains intubated, CXR unremarkable.   He was noted to have anisocoria post-anesthesia yesterday.  Head CT did not show acute findings.  He is back on heparin gtt.   He was started on Zosyn yesterday to cover pathogens from gut given abdominal pain/ileus.  This morning, he indicates no abdominal pain/tenderness though abdomen still distended. Afebrile, cultures sent.   Minimal UOP, creatinine up to 4.48.   Swan numbers: CVP 10 PA 30/13 PCWP 11 CI 2.4 SVR 824 Co-ox 73%  RHC Procedural Findings (on milrinone 0.25 mcg/kg/min): Hemodynamics (mmHg) RA mean 4 RV 20/5 PA 22/4, mean 14 PCWP mean 5 Oxygen saturations: PA 65% AO 98% Cardiac Output (Fick) 4.5  Cardiac Index (Fick) 2.06 Cardiac Output (Thermo) 4.5 Cardiac Index (Thermo) 2.06  Objective:   Weight Range: 96.1 kg Body mass index is 27.95 kg/m.   Vital Signs:   Temp:  [97 F (36.1 C)-98.8 F (37.1 C)] 98.4 F (36.9 C) (10/20 0700) Pulse Rate:  [79-141] 79 (10/20 0700) Resp:  [12-56] 13 (10/20 0700) BP: (82-172)/(46-149) 113/61 (10/20 0700) SpO2:  [93 %-100 %] 100 % (10/20 0700) Arterial Line BP: (73-120)/(40-67) 90/50 (10/20 0700) FiO2 (%):  [50 %] 50 % (10/20 0324) Weight:  [96.1 kg] 96.1 kg (10/20 0430) Last BM Date:  (PTA)  Weight change: Filed Weights   12/06/19 0430  Weight: 96.1 kg    Intake/Output:   Intake/Output Summary (Last 24 hours) at 12/06/2019 0801 Last data filed at 12/06/2019 0600 Gross per 24 hour  Intake 2574.63 ml   Output 110 ml  Net 2464.63 ml      Physical Exam    General:  Well appearing. No resp difficulty HEENT: Normal Neck: Supple. JVP 8 cm. Carotids 2+ bilat; no bruits. No lymphadenopathy or thyromegaly appreciated. Cor: PMI nondisplaced. Regular rate & rhythm. No rubs, gallops or murmurs. Lungs: Clear Abdomen: Soft, nontender, moderately distended. No hepatosplenomegaly. No bruits or masses. Good bowel sounds. Extremities: No cyanosis, clubbing, rash, edema Neuro: Alert on vent, cranial nerves grossly intact. moves all 4 extremities w/o difficulty. Affect pleasant   Telemetry   NSR in 80s (personally reviewed)  Labs    CBC Recent Labs    12/04/19 0035 12/04/19 0035 12/05/19 0725 12/05/19 1548 12/05/19 1714 12/06/19 0246  WBC 22.9*   < > 24.0*  --   --  17.9*  NEUTROABS 18.5*  --   --   --   --   --   HGB 15.3   < > 14.4   < > 14.6 11.5*  HCT 46.1   < > 43.1   < > 43.0 35.1*  MCV 83.4   < > 83.0  --   --  85.2  PLT 314   < > 316  --   --  346   < > = values in this interval not displayed.   Basic Metabolic Panel Recent Labs    12/04/19 0034 12/04/19 1245 12/05/19 0725 12/05/19 1548 12/05/19  1714 12/06/19 0246  NA 134*   < > 132*   < > 135 131*  K 4.8   < > 4.6   < > 4.3 4.3  CL 102   < > 99  --   --  100  CO2 22   < > 19*  --   --  18*  GLUCOSE 228*   < > 244*  --   --  354*  BUN 20   < > 53*  --   --  70*  CREATININE 1.53*   < > 3.10*  --   --  4.48*  CALCIUM 9.5   < > 9.8  --   --  8.6*  MG 2.1  --   --   --   --   --   PHOS 2.9  --   --   --   --   --    < > = values in this interval not displayed.   Liver Function Tests Recent Labs    12/05/19 0725 12/06/19 0246  AST 79* 52*  ALT 45* 31  ALKPHOS 78 79  BILITOT 1.2 0.6  PROT 7.1 5.8*  ALBUMIN 3.2* 2.4*   No results for input(s): LIPASE, AMYLASE in the last 72 hours. Cardiac Enzymes Recent Labs    12/03/19 2233  CKTOTAL 2,183*    BNP: BNP (last 3 results) No results for input(s): BNP  in the last 8760 hours.  ProBNP (last 3 results) No results for input(s): PROBNP in the last 8760 hours.   D-Dimer No results for input(s): DDIMER in the last 72 hours. Hemoglobin A1C Recent Labs    12/03/19 1956  HGBA1C 7.8*   Fasting Lipid Panel Recent Labs    12/03/19 1956  CHOL 284*  HDL 75  LDLCALC 185*  TRIG 121  CHOLHDL 3.8   Thyroid Function Tests Recent Labs    12/04/19 0034  TSH 0.630    Other results:   Imaging    DG Abd 1 View  Result Date: 12/05/2019 CLINICAL DATA:  Abdominal distension.  Admitted with STEMI. EXAM: ABDOMEN - 1 VIEW COMPARISON:  05/19/2015 FINDINGS: Diffuse gas dilated bowel primarily affecting stomach and small bowel with gas seen to the level of the ileum. Proximal colon is distended with remaining colon not well visualized. No rectal impaction or colonic volvulus appearance. No concerning mass effect or evidence of extraluminal gas, although supine. IMPRESSION: Diffuse gas dilated bowel. Although the distal colon is less affected, ileus is still favored. Electronically Signed   By: Monte Fantasia M.D.   On: 12/05/2019 09:32   US Renal Transplant w/Doppler  Result Date: 12/06/2019 CLINICAL DATA:  59 year old male with acute renal insufficiency. Right lower quadrant transplant kidney. EXAM: ULTRASOUND OF RENAL TRANSPLANT WITH RENAL DOPPLER ULTRASOUND TECHNIQUE: Ultrasound examination of the renal transplant was performed with gray-scale, color and duplex doppler evaluation. COMPARISON:  Ultrasound dated 07/05/2017. FINDINGS: Transplant kidney location: Right lower quadrant Transplant Kidney: Renal measurements: 11.3 x 8.2 x 6.3 = volume: 361mL. Normal in size and parenchymal echogenicity. No evidence of mass or hydronephrosis. No peri-transplant fluid collection seen. Color flow in the main renal artery:  Yes Color flow in the main renal vein:  Yes Duplex Doppler Evaluation: Main Renal Artery Velocity: 99 cm/sec Main Renal Artery Resistive  Index: 0.79 (previously 0.79). Venous waveform in main renal vein:  Present Intrarenal resistive index in upper pole:  0.83 (previously 0.64) (normal 0.6-0.8; equivocal 0.8-0.9; abnormal >= 0.9) Intrarenal resistive index  in lower pole: 0.69 (previously 0.66) (normal 0.6-0.8; equivocal 0.8-0.9; abnormal >= 0.9) Bladder: Decompressed around a Foley catheter. Other findings:  None. IMPRESSION: 1. No hydronephrosis or peritransplant collection. 2. Borderline elevated resistive index in the upper pole of the transplant kidney. 3. The transplant main renal artery and vein are patent. Electronically Signed   By: Anner Crete M.D.   On: 12/06/2019 02:08   CARDIAC CATHETERIZATION  Result Date: 12/05/2019 1. Filling pressures normal to low. 2. Cardiac output is low at 2.06 by Fick and thermodilution on milrinone 0.25 mcg/kg/min. Discussed findings with Dr. Prescott Gum.  Patient is tachycardic and mildly hypotension with SBP upper 80s/90s on milrinone 0.25 mcg/kg/min.  While his cardiac output on milrinone is not markedly low, his hemodynamics are marginal.  He is on inotrope now s/p large MI with residual severe coronary disease.  We decided that the best plan for him would be Impella 5.5 to allow unloading of LV and to limit the need for inotropes/pressors. He has an ileus, will cover with Zosyn IV for now and will get CT abdomen/pelvis at some point post-Impella.   Port CXR  Result Date: 12/06/2019 CLINICAL DATA:  Intubation.  Swan-Ganz catheter EXAM: PORTABLE CHEST 1 VIEW COMPARISON:  12/05/2019. FINDINGS: Endotracheal tube, NG tube, Swan-Ganz catheter in stable position. Stable cardiomegaly. No pulmonary venous congestion. Low lung volumes with basilar atelectasis. No focal infiltrate. No pleural effusion or pneumothorax. Left subclavian stents again noted. These are in an angled position and appear unchanged from 12/05/1999. IMPRESSION: 1. Lines and tubes in stable position. 2. Stable cardiomegaly. 3. Low  lung volumes with bibasilar atelectasis. Electronically Signed   By: Marcello Moores  Register   On: 12/06/2019 07:21   DG Chest Port 1 View  Result Date: 12/05/2019 CLINICAL DATA:  STEMI EXAM: PORTABLE CHEST 1 VIEW COMPARISON:  12/03/2019 FINDINGS: Swan-Ganz catheter is in the central right pulmonary artery. NG tube is in the stomach. Endotracheal tube is 5 cm above the carina. Heart is normal size. Lungs clear. No effusions or pneumothorax. IMPRESSION: Support devices in expected position as above. No acute cardiopulmonary disease. Electronically Signed   By: Rolm Baptise M.D.   On: 12/05/2019 19:16   CT HEAD CODE STROKE WO CONTRAST  Result Date: 12/05/2019 CLINICAL DATA:  Code stroke.  59 year old male with unequal pupils. EXAM: CT HEAD WITHOUT CONTRAST TECHNIQUE: Contiguous axial images were obtained from the base of the skull through the vertex without intravenous contrast. COMPARISON:  Head CT 03/15/2013. FINDINGS: Brain: Stable cerebral volume since 2015, within normal limits for age. Age indeterminate hypodensities in the lateral right thalamus and the right caudate (series 3, image 16), new from 30. Gray-white matter differentiation elsewhere is stable and within normal limits. No midline shift, ventriculomegaly, mass effect, evidence of mass lesion, intracranial hemorrhage or evidence of cortically based acute infarction. Vascular: Calcified atherosclerosis at the skull base. No suspicious intracranial vascular hyperdensity. Skull: Stable, negative. Sinuses/Orbits: Visualized paranasal sinuses and mastoids are clear. Other: Visualized orbits and scalp soft tissues are within normal limits. ASPECTS [facility] Stroke Program Early CT Score) Total score (0-10 with 10 being normal): 10 IMPRESSION: 1. Age indeterminate lacunar infarcts in the right thalamus and right caudate, new since 2015. 2. No acute cortically based infarct or intracranial hemorrhage identified. ASPECTS 10. 3. These results were  communicated to Dr. Erlinda Hong At 7:25 pm on 12/05/2019 by text page via the [facility] messaging system. Electronically Signed   By: Genevie Ann M.D.   On: 12/05/2019 19:25  Medications:     Scheduled Medications: . aspirin  81 mg Per Tube Daily  . Chlorhexidine Gluconate Cloth  6 each Topical Daily  . docusate  100 mg Per Tube BID  . fentaNYL (SUBLIMAZE) injection  50 mcg Intravenous Once  . insulin aspart  0-9 Units Subcutaneous Q4H  . pantoprazole (PROTONIX) IV  40 mg Intravenous Q24H  . polyethylene glycol  17 g Per Tube Daily  . predniSONE  5 mg Per Tube Q breakfast  . rosuvastatin  40 mg Per Tube Daily  . sodium chloride flush  3 mL Intravenous Q12H  . tacrolimus  3 mg Per Tube BID  . ticagrelor  90 mg Per Tube BID     Infusions: . sodium chloride    . sodium chloride    . sodium chloride    . sodium chloride    . albumin human    . amiodarone 30 mg/hr (12/06/19 0600)  . epinephrine    . fentaNYL infusion INTRAVENOUS 100 mcg/hr (12/06/19 0600)  . heparin 1,400 Units/hr (12/06/19 0600)  . lactated ringers    . milrinone 0.25 mcg/kg/min (12/06/19 0600)  . norepinephrine (LEVOPHED) Adult infusion 40 mcg/min (12/06/19 0600)  . phenylephrine (NEO-SYNEPHRINE) Adult infusion    . piperacillin-tazobactam (ZOSYN)  IV 12.5 mL/hr at 12/06/19 0600  . vasopressin 0.04 Units/min (12/06/19 0600)     PRN Medications:  sodium chloride, midazolam, midazolam  Assessment/Plan   1. Shock: Ischemic cardiomyopathy with EF 20-25% post-late presentation anterior MI.  ?Component of septic shock as well (gut source).  SVR 824 today on vasopressin 0.04, NE 40, milrinone 0.25.  MAP now stable with room to wean.  Excellent cardiac output by thermodilution and co-ox.  CVP about 10 today with PCWP 11. Lactate normal.  - Send procalcitonin, as above ?septic shock component.  Continue Zosyn coverage. - Continue milrinone 0.25.  Begin to titrate down NE.   - Not candidate for Impella at this point with  developing LV thrombus on TEE. - If stabilizes and creatinine comes down, consider PCI to LCx system.  2. CAD: Delayed presentation anterior MI (CP began 10/15, PCI on 10/17).  He had DES to LAD, has residual severe up to 99% mid to distal RCA stenosis that is not revascularizable.  He has 95% mid-distal LCx stenosis and 80% OM3 stenosis that could be intervened upon. No chest pain.  - He is on Brilinta 90 bid + ASA 81 + statin post-MI.  - As above, will ideally have eventual PCI to LCx system.  3. AKI on CKD stage 3: Patient has history of renal transplant at [facility] in 2011.  Baseline creatinine 1.4, up to 4.48 today.  Suspect combination of contrast-induced nephropathy and cardiorenal syndrome in setting of cardiogenic shock. Only 110 cc UOP documented yesterday.  - Tacrolimus dose has been decreased, level sent today from [facility].  Will follow.  - Nephrology now following.  - Support cardiac output and MAP.  4. Elevated LFTs: Mild elevation, suspect shock liver with hypotension/cardiogenic shock. Trending down.  5. DM2: SSI.  6. Abdominal pain: Diffuse mild tenderness on 10/19.  Plain films with ileus.  May be related to low output.   - Will send for noncontrast abdominal CT.  - Will cover empirically with antibiotics for now, Zosyn.  7. Sludge LV apex/early thrombus: He is on heparin gtt.  8. ID: WBCs elevated but coming down, afebrile.  ?Component of septic shock and covering for now with Zosyn (?abdominal source).  - As  above, plan abdominal CT.  - Continue Zosyn.  - Send PCT.   CRITICAL CARE Performed by: Loralie Champagne  Total critical care time: 40 minutes  Critical care time was exclusive of separately billable procedures and treating other patients.  Critical care was necessary to treat or prevent imminent or life-threatening deterioration.  Critical care was time spent personally by me on the following activities: development of treatment plan with patient and/or surrogate as well  as nursing, discussions with consultants, evaluation of patient's response to treatment, examination of patient, obtaining history from patient or surrogate, ordering and performing treatments and interventions, ordering and review of laboratory studies, ordering and review of radiographic studies, pulse oximetry and re-evaluation of patient's condition.   Length of Stay: 1  Loralie Champagne, MD  12/06/2019, 8:01 AM  Advanced Heart Failure Team Pager [phone] (M-F; 7a - 4p)  Please contact [facility] for night-coverage after hours (4p -7a ) and weekends on [email or web address]

## 2019-12-06 NOTE — Progress Notes (Signed)
ANTICOAGULATION CONSULT NOTE - Follow Up Consult  Pharmacy Consult for heparin Indication: apical thrombus  Labs: Recent Labs    12/03/19 1956 12/03/19 1956 12/03/19 2233 12/04/19 0034 12/04/19 0034 12/04/19 0035 12/04/19 0035 12/04/19 1245 12/04/19 2140 12/05/19 0725 12/05/19 1342 12/05/19 1548 12/05/19 1549 12/05/19 1549 12/05/19 1714 12/05/19 1955 12/06/19 0246  HGB 17.4*   < >  --   --   --  15.3   < >  --   --  14.4  --    < > 14.3   < > 14.6  --  11.5*  HCT 52.1*   < >  --   --   --  46.1   < >  --   --  43.1  --    < > 42.0  --  43.0  --  35.1*  PLT 303   < >  --   --   --  314  --   --   --  316  --   --   --   --   --   --  346  APTT 34  --   --   --   --   --   --   --   --   --   --   --   --   --   --   --   --   LABPROT 13.8  --   --   --   --   --   --   --   --   --   --   --   --   --   --  15.3*  --   INR 1.1  --   --   --   --   --   --   --   --   --   --   --   --   --   --  1.3*  --   HEPARINUNFRC  --   --   --   --   --   --   --   --  0.59 0.42  --   --   --   --   --   --  <0.10*  CREATININE 1.46*   < >  --  1.53*   < >  --   --  1.86*  --  3.10*  --   --   --   --   --   --  4.48*  CKTOTAL  --   --  2,183*  --   --   --   --   --   --   --   --   --   --   --   --   --   --   TROPONINIHS >27,000*   < > >27,000* >27,000*  --   --   --   --   --   --  >27,000*  --   --   --   --  >27,000*  --    < > = values in this interval not displayed.    Assessment/Plan:  59yo male subtherapeutic on heparin after resumed but was previously at goal at this rate and likely needs more time to accumulate. Will continue gtt at current rate and check additional level.   Wynona Neat, PharmD, BCPS  12/06/2019,6:45 AM

## 2019-12-07 ENCOUNTER — Encounter (HOSPITAL_COMMUNITY): Payer: Self-pay | Admitting: *Deleted

## 2019-12-07 ENCOUNTER — Encounter (HOSPITAL_COMMUNITY): Payer: Self-pay | Admitting: Cardiology

## 2019-12-07 ENCOUNTER — Other Ambulatory Visit: Payer: Self-pay

## 2019-12-07 DIAGNOSIS — I5021 Acute systolic (congestive) heart failure: Secondary | ICD-10-CM | POA: Diagnosis not present

## 2019-12-07 DIAGNOSIS — N179 Acute kidney failure, unspecified: Secondary | ICD-10-CM | POA: Diagnosis not present

## 2019-12-07 DIAGNOSIS — N189 Chronic kidney disease, unspecified: Secondary | ICD-10-CM | POA: Diagnosis not present

## 2019-12-07 LAB — BASIC METABOLIC PANEL
Anion gap: 14 (ref 5–15)
BUN: 58 mg/dL — ABNORMAL HIGH (ref 6–20)
CO2: 20 mmol/L — ABNORMAL LOW (ref 22–32)
Calcium: 9.6 mg/dL (ref 8.9–10.3)
Chloride: 103 mmol/L (ref 98–111)
Creatinine, Ser: 3.83 mg/dL — ABNORMAL HIGH (ref 0.61–1.24)
GFR, Estimated: 17 mL/min — ABNORMAL LOW (ref >60)
Glucose, Bld: 217 mg/dL — ABNORMAL HIGH (ref 70–99)
Potassium: 3.8 mmol/L (ref 3.5–5.1)
Sodium: 137 mmol/L (ref 135–145)

## 2019-12-07 LAB — COMPREHENSIVE METABOLIC PANEL
ALT: 29 U/L (ref 0–44)
AST: 33 U/L (ref 15–41)
Albumin: 2.4 g/dL — ABNORMAL LOW (ref 3.5–5.0)
Alkaline Phosphatase: 75 U/L (ref 38–126)
Anion gap: 13 (ref 5–15)
BUN: 58 mg/dL — ABNORMAL HIGH (ref 6–20)
CO2: 17 mmol/L — ABNORMAL LOW (ref 22–32)
Calcium: 9.3 mg/dL (ref 8.9–10.3)
Chloride: 104 mmol/L (ref 98–111)
Creatinine, Ser: 3.62 mg/dL — ABNORMAL HIGH (ref 0.61–1.24)
GFR, Estimated: 17 mL/min — ABNORMAL LOW (ref >60)
Glucose, Bld: 174 mg/dL — ABNORMAL HIGH (ref 70–99)
Potassium: 4.1 mmol/L (ref 3.5–5.1)
Sodium: 134 mmol/L — ABNORMAL LOW (ref 135–145)
Total Bilirubin: 0.6 mg/dL (ref 0.3–1.2)
Total Protein: 6.1 g/dL — ABNORMAL LOW (ref 6.5–8.1)

## 2019-12-07 LAB — GLUCOSE, CAPILLARY
Glucose-Capillary: 162 mg/dL — ABNORMAL HIGH (ref 70–99)
Glucose-Capillary: 163 mg/dL — ABNORMAL HIGH (ref 70–99)
Glucose-Capillary: 157 mg/dL — ABNORMAL HIGH (ref 70–99)
Glucose-Capillary: 194 mg/dL — ABNORMAL HIGH (ref 70–99)
Glucose-Capillary: 217 mg/dL — ABNORMAL HIGH (ref 70–99)
Glucose-Capillary: 217 mg/dL — ABNORMAL HIGH (ref 70–99)

## 2019-12-07 LAB — COOXEMETRY PANEL
Carboxyhemoglobin: 0.9 % (ref 0.5–1.5)
Methemoglobin: 0.9 % (ref 0.0–1.5)
O2 Saturation: 64.8 %
Total hemoglobin: 11.7 g/dL — ABNORMAL LOW (ref 12.0–16.0)

## 2019-12-07 LAB — CBC
HCT: 31.9 % — ABNORMAL LOW (ref 39.0–52.0)
Hemoglobin: 10.8 g/dL — ABNORMAL LOW (ref 13.0–17.0)
MCH: 28.1 pg (ref 26.0–34.0)
MCHC: 33.9 g/dL (ref 30.0–36.0)
MCV: 82.9 fL (ref 80.0–100.0)
Platelets: 220 10*3/uL (ref 150–400)
RBC: 3.85 MIL/uL — ABNORMAL LOW (ref 4.22–5.81)
RDW: 14.1 % (ref 11.5–15.5)
WBC: 9.6 10*3/uL (ref 4.0–10.5)
nRBC: 0 % (ref 0.0–0.2)

## 2019-12-07 LAB — MAGNESIUM: Magnesium: 2.8 mg/dL — ABNORMAL HIGH (ref 1.7–2.4)

## 2019-12-07 LAB — PROCALCITONIN: Procalcitonin: 37.58 ng/mL

## 2019-12-07 LAB — HEPARIN LEVEL (UNFRACTIONATED): Heparin Unfractionated: 0.42 IU/mL (ref 0.30–0.70)

## 2019-12-07 MED ORDER — SORBITOL 70 % SOLN
30.0000 mL | Freq: Once | Status: AC
  Administered 2019-12-07: 30 mL
  Filled 2019-12-07: qty 30

## 2019-12-07 MED ORDER — ALPRAZOLAM 0.5 MG PO TABS
0.5000 mg | ORAL_TABLET | Freq: Once | ORAL | Status: AC
  Administered 2019-12-07: 0.5 mg via NASOGASTRIC
  Filled 2019-12-07: qty 1

## 2019-12-07 MED ORDER — ACETAMINOPHEN 650 MG RE SUPP
650.0000 mg | RECTAL | Status: DC | PRN
  Administered 2019-12-07: 650 mg via RECTAL
  Filled 2019-12-07: qty 1

## 2019-12-07 MED ORDER — METOCLOPRAMIDE HCL 5 MG/ML IJ SOLN
5.0000 mg | Freq: Three times a day (TID) | INTRAMUSCULAR | Status: DC
  Administered 2019-12-07: 5 mg via INTRAVENOUS
  Filled 2019-12-07: qty 2

## 2019-12-07 MED ORDER — SODIUM BICARBONATE 650 MG PO TABS
1300.0000 mg | ORAL_TABLET | Freq: Three times a day (TID) | ORAL | Status: DC
  Administered 2019-12-07 – 2019-12-15 (×24): 1300 mg via ORAL
  Filled 2019-12-07 (×24): qty 2

## 2019-12-07 MED ORDER — AMIODARONE IV BOLUS ONLY 150 MG/100ML
150.0000 mg | Freq: Once | INTRAVENOUS | Status: AC
  Administered 2019-12-07: 150 mg via INTRAVENOUS
  Filled 2019-12-07: qty 100

## 2019-12-07 MED ORDER — FUROSEMIDE 10 MG/ML IJ SOLN
40.0000 mg | Freq: Two times a day (BID) | INTRAMUSCULAR | Status: DC
  Administered 2019-12-07 – 2019-12-08 (×3): 40 mg via INTRAVENOUS
  Filled 2019-12-07 (×3): qty 4

## 2019-12-07 MED ORDER — METOCLOPRAMIDE HCL 5 MG/ML IJ SOLN
5.0000 mg | Freq: Three times a day (TID) | INTRAMUSCULAR | Status: DC
  Administered 2019-12-07 – 2019-12-14 (×23): 5 mg via INTRAVENOUS
  Filled 2019-12-07 (×24): qty 2

## 2019-12-07 NOTE — Progress Notes (Signed)
NAME:  Casey Saefong., MRN:  [id number], DOB:  September 05, 1960, LOS: 2 ADMISSION DATE:  12/05/2019, CONSULTATION DATE:  12/05/19 REFERRING MD:  Loralie Champagne, MD CHIEF COMPLAINT:  Vent and CCM management  Brief History   59 year old male initially admitted to [facility] for anterior STEMI. Underwent cardiac cath and had DES to LAD complicated by post-cath cardiogenic shock and acute on chronic kidney injury. He was transferred to [facility] for intervention. On arrival he was hypotensive with SBP in 80s. He went to the cath lab for Swan-Ganz and then TEE in the OR. Initially planned for impella however procedure cancelled on finding an LV thrombus on TEE. On return to ICU room he remained in cardiogenic shock on multiple pressors. He was also noted to have unequal pupils which prompted code stroke. Patient remained intubated post-op due to critical condition. PCCM consulted for assistance  History of present illness   As above  Past Medical History  S/p renal transplant in 2011, HTN, DM@, HLD, chronic systolic heart failure  Significant Hospital Events   10/19 Admitted to [facility]  Consults:  PCCM Neuro  Procedures:  10/17 Cardiac cath with DES to LAD 10/19 Swan ganz placment, TEE 10/20 extubated  Significant Diagnostic Tests:  CT Head Stroke 10/19 - Age indeterminate lacunar infarcts in the right thalamus and right caudate  CT ABD 10/20 > Mild diffuse bowel dilatation most consistent with an ileus. Nonobstructing 3 mm calculus in the lower pole of the right iliac fossa renal transplant. No evidence of hydronephrosis, Dependent opacities in both lower lobes, probably atelectasis, although potentially aspiration.  Micro Data:  Blood cultures 10/19 >  Antimicrobials:    Interim history/subjective:  Patient is lying in bed with continued complaints of abdominal tenderness and fullness He reports he has been passing some flatus RN reports difficulty resting overnight due to abdominal  discomfort  Objective   Blood pressure (!) 155/65, pulse 91, temperature 98.2 F (36.8 C), resp. rate (!) 27, height 6\' 1"  (1.854 m), weight 96.1 kg, SpO2 98 %. PAP: (23-47)/(13-24) 47/16 CVP:  [4 mmHg-33 mmHg] 6 mmHg PCWP:  [13 mmHg-15 mmHg] 15 mmHg CO:  [5.4 L/min-6.9 L/min] 5.4 L/min CI:  [2.5 L/min/m2-3.2 L/min/m2] 2.5 L/min/m2  FiO2 (%):  [28 %-40 %] 28 %   Intake/Output Summary (Last 24 hours) at 12/07/2019 0731 Last data filed at 12/07/2019 0500 Gross per 24 hour  Intake 1544.71 ml  Output 2785 ml  Net -1240.29 ml   Filed Weights   12/06/19 0430 12/07/19 0200  Weight: 96.1 kg 96.1 kg   Physical Exam: General: Acute ill-appearing elderly gentleman lying in bed on mechanical ventilation in no acute distress  HEENT: [place]/AT, MM pink/moist, PERRL, NGtube in place Neuro: Alert and oriented x3, nonfocal CV: s1s2 regular rate and rhythm, no murmur, rubs, or gallops,  PULM: Clear to auscultation bilaterally, no increased work of breathing GI: soft, bowel sounds active in all 4 quadrants, non-tender, non-distended, NG tube in place to suction and gastric decompression Extremities: warm/dry, no edema  Skin: no rashes or lesions  Resolved Hospital Problem list   Acute encephalopathy, unclear  Acute hypoxemic respiratory failure for airway protection  Assessment & Plan:   Age-indeterminate lacunar infarcts in right thalamus and caudate  - no focal deficit Anisocoria, chronic due to prior trauma  P: Management per neurology, appreciate assistance Maintain neuro protective measures Nutrition and bowel regiment  Minimize sedation  Cardiogenic shock:  -Not a candidate for impella or IABP due  to thrombus Anterior STEMI s/p PCI and DES to LAD -Delayed presentation for anterior MI, chest pain initially began 10/15 underwent PCI 10/17 Ischemic cardiomyopathy  -EF 15-20% P: Cardiology and heart failure following, appreciate assistance Continue pressor support currently  requiring milrinone and vasopressin Map goal greater than 65 Follow daily Co-ox Continue aspirin and Brilinta Continue to trend troponin Continue statin May need further PCI interventions once stabilized  LV thrombus -Seen on TEE 10/19 P: Continue heparin drip  Atrial fibrillation -Patient seen with A. fib RVR post Cath Lab P: Continue heparin drip  Continuous telemetry Continue IV amiodarone  Ileus -Seen on CT abdomen on 10/20 P: Continue to hold tube feeds NG tube in place to low intermittent wall suction for gastric decompression Limit use of opioids Inability to utilize NSAIDs given renal dysfunction Given elevated procalcitonin concern for intra-abdominal infection primary team initiated Zosyn 10/20  S/p renal transplant AoCKD IV -Renal transplant doppler with patent artery and vein P: Nephrology following, appreciate assistance Continue prednisone and Prograf MAP goal greater than 65 Avoid nephrotoxins Defer initiation of CRRT to nephrology Closely monitor urine output, Foley in place  DM2 P: Continue CBG every 4 Continue SSI Continue Lantus  Best practice:  Diet: NPO Pain/Anxiety/Delirium protocol (if indicated): As above VAP protocol (if indicated): Yes DVT prophylaxis: Heparin GI prophylaxis: PPI Glucose control: CBG q4h Mobility: BR Code Status: Full Family Communication: Per primary Disposition: Remain in ICU  Labs   CBC: Recent Labs  Lab 12/03/19 1956 12/03/19 1956 12/04/19 0035 12/04/19 0035 12/05/19 0725 12/05/19 0725 12/05/19 1548 12/05/19 1549 12/05/19 1714 12/06/19 0246 12/07/19 0338  WBC 23.9*  --  22.9*  --  24.0*  --   --   --   --  17.9* 9.6  NEUTROABS  --   --  18.5*  --   --   --   --   --   --   --   --   HGB 17.4*   < > 15.3   < > 14.4   < > 13.9 14.3 14.6 11.5* 10.8*  HCT 52.1*   < > 46.1   < > 43.1   < > 41.0 42.0 43.0 35.1* 31.9*  MCV 84.2  --  83.4  --  83.0  --   --   --   --  85.2 82.9  PLT 303  --  314  --   316  --   --   --   --  346 220   < > = values in this interval not displayed.    Basic Metabolic Panel: Recent Labs  Lab 12/04/19 0034 12/04/19 0034 12/04/19 1245 12/04/19 1245 12/05/19 0725 12/05/19 0725 12/05/19 1548 12/05/19 1549 12/05/19 1714 12/06/19 0246 12/07/19 0338  NA 134*   < > 135   < > 132*   < > 135 133* 135 131* 134*  K 4.8   < > 4.9   < > 4.6   < > 4.1 4.3 4.3 4.3 4.1  CL 102  --  103  --  99  --   --   --   --  100 104  CO2 22  --  21*  --  19*  --   --   --   --  18* 17*  GLUCOSE 228*  --  207*  --  244*  --   --   --   --  354* 174*  BUN 20  --  30*  --  53*  --   --   --   --  70* 58*  CREATININE 1.53*  --  1.86*  --  3.10*  --   --   --   --  4.48* 3.62*  CALCIUM 9.5  --  9.7  --  9.8  --   --   --   --  8.6* 9.3  MG 2.1  --   --   --   --   --   --   --   --   --   --   PHOS 2.9  --   --   --   --   --   --   --   --   --   --    < > = values in this interval not displayed.   GFR: Estimated Creatinine Clearance: 26.9 mL/min (A) (by C-G formula based on SCr of 3.62 mg/dL (H)). Recent Labs  Lab 12/04/19 0035 12/05/19 0725 12/05/19 1453 12/05/19 1955 12/06/19 0246 12/06/19 0757 12/07/19 0338  PROCALCITON  --   --   --   --   --  61.83 37.58  WBC 22.9* 24.0*  --   --  17.9*  --  9.6  LATICACIDVEN  --   --  1.3 1.4  --   --   --     Liver Function Tests: Recent Labs  Lab 12/03/19 1956 12/04/19 0034 12/05/19 0725 12/06/19 0246 12/07/19 0338  AST 267* 245* 79* 52* 33  ALT 82* 67* 45* 31 29  ALKPHOS 89 75 78 79 75  BILITOT 1.4* 1.2 1.2 0.6 0.6  PROT 8.5* 6.7 7.1 5.8* 6.1*  ALBUMIN 4.4 3.6 3.2* 2.4* 2.4*   No results for input(s): LIPASE, AMYLASE in the last 168 hours. No results for input(s): AMMONIA in the last 168 hours.  ABG    Component Value Date/Time   PHART 7.351 12/05/2019 1714   PCO2ART 36.6 12/05/2019 1714   PO2ART 142 (H) 12/05/2019 1714   HCO3 20.2 12/05/2019 1714   TCO2 21 (L) 12/05/2019 1714   ACIDBASEDEF 5.0 (H)  12/05/2019 1714   O2SAT 64.8 12/07/2019 0338     Coagulation Profile: Recent Labs  Lab 12/03/19 1956 12/05/19 1955  INR 1.1 1.3*    Cardiac Enzymes: Recent Labs  Lab 12/03/19 2233  CKTOTAL 2,183*    HbA1C: Hgb A1c MFr Bld  Date/Time Value Ref Range Status  12/03/2019 07:56 PM 7.8 (H) 4.8 - 5.6 % Final    Comment:    (NOTE)         Prediabetes: 5.7 - 6.4         Diabetes: >6.4         Glycemic control for adults with diabetes: <7.0     CBG: Recent Labs  Lab 12/06/19 1228 12/06/19 1611 12/06/19 2012 12/07/19 0054 12/07/19 0352  GLUCAP 255* 206* 176* 162* 163*   Critical care time:    Performed by: Johnsie Cancel  Total critical care time: 38 minutes  Critical care time was exclusive of separately billable procedures and treating other patients.  Critical care was necessary to treat or prevent imminent or life-threatening deterioration.  Critical care was time spent personally by me on the following activities: development of treatment plan with patient and/or surrogate as well as nursing, discussions with consultants, evaluation of patient's response to treatment, examination of patient, obtaining history from patient or surrogate, ordering and performing treatments and interventions, ordering and review of laboratory  studies, ordering and review of radiographic studies, pulse oximetry and re-evaluation of patient's condition.  Johnsie Cancel, NP-C [facility] Contact / Pager information can be found on Amion  12/07/2019, 7:52 AM

## 2019-12-07 NOTE — Progress Notes (Signed)
Patient ID: Casey [name]., male   DOB: 07-Nov-1960, 59 y.o.   MRN: [id number]     Advanced Heart Failure Rounding Note  PCP-Cardiologist: No primary care provider on file.   Subjective:    Patient taken on OR 10/19 for Impella 5.5, unable to place due to sludge/early thrombus present at LV apex.  Subsequently returned [place], developed AF/RVR and started on amiodarone.    Now extubated, doing better. Off pressors, remains on milrinone 0.25. Co-ox 65%.    Still with abdominal discomfort, no BM.  Abdominal CT showed ileus but no other acute findings.  Tm 99.1 but PCT up to 38. WBCs 9.6. Started on Zosyn to cover gut source for sepsis. Cultures NGTD.   He is in NSR on amiodarone.    Improving UOP, creatinine 4.48 => 3.62.   Swan numbers: CVP 11 PA 37/14 CI 2.5 Co-ox 65%  RHC Procedural Findings (on milrinone 0.25 mcg/kg/min): Hemodynamics (mmHg) RA mean 4 RV 20/5 PA 22/4, mean 14 PCWP mean 5 Oxygen saturations: PA 65% AO 98% Cardiac Output (Fick) 4.5  Cardiac Index (Fick) 2.06 Cardiac Output (Thermo) 4.5 Cardiac Index (Thermo) 2.06  Objective:   Weight Range: 96.1 kg Body mass index is 27.95 kg/m.   Vital Signs:   Temp:  [98.2 F (36.8 C)-99.1 F (37.3 C)] 98.4 F (36.9 C) (10/21 0800) Pulse Rate:  [80-98] 96 (10/21 0800) Resp:  [16-38] 33 (10/21 0800) BP: (108-168)/(47-134) 149/73 (10/21 0800) SpO2:  [93 %-100 %] 96 % (10/21 0800) Arterial Line BP: (102-160)/(54-83) 149/71 (10/21 0800) FiO2 (%):  [28 %-40 %] 28 % (10/20 0904) Weight:  [96.1 kg] 96.1 kg (10/21 0200) Last BM Date:  (PTA)  Weight change: Filed Weights   12/06/19 0430 12/07/19 0200  Weight: 96.1 kg 96.1 kg    Intake/Output:   Intake/Output Summary (Last 24 hours) at 12/07/2019 0836 Last data filed at 12/07/2019 0700 Gross per 24 hour  Intake 1636.55 ml  Output 2785 ml  Net -1148.45 ml      Physical Exam    General: NAD Neck: JVP 10 -12 cm, no thyromegaly or thyroid nodule.    Lungs: Decreased at bases.  CV: Nondisplaced PMI.  Heart regular S1/S2, no S3/S4, 2/6 SEM RUSB.  No peripheral edema.   Abdomen: Soft, nontender, no hepatosplenomegaly, moderate distention. Hypoactive bowel sounds.  Skin: Intact without lesions or rashes.  Neurologic: Alert and oriented x 3.  Psych: Normal affect. Extremities: No clubbing or cyanosis.  HEENT: Normal.    Telemetry   NSR in 80s (personally reviewed)  Labs    CBC Recent Labs    12/06/19 0246 12/07/19 0338  WBC 17.9* 9.6  HGB 11.5* 10.8*  HCT 35.1* 31.9*  MCV 85.2 82.9  PLT 346 767   Basic Metabolic Panel Recent Labs    12/06/19 0246 12/07/19 0338  NA 131* 134*  K 4.3 4.1  CL 100 104  CO2 18* 17*  GLUCOSE 354* 174*  BUN 70* 58*  CREATININE 4.48* 3.62*  CALCIUM 8.6* 9.3   Liver Function Tests Recent Labs    12/06/19 0246 12/07/19 0338  AST 52* 33  ALT 31 29  ALKPHOS 79 75  BILITOT 0.6 0.6  PROT 5.8* 6.1*  ALBUMIN 2.4* 2.4*   No results for input(s): LIPASE, AMYLASE in the last 72 hours. Cardiac Enzymes No results for input(s): CKTOTAL, CKMB, CKMBINDEX, TROPONINI in the last 72 hours.  BNP: BNP (last 3 results) No results for input(s): BNP in the last 8760  hours.  ProBNP (last 3 results) No results for input(s): PROBNP in the last 8760 hours.   D-Dimer No results for input(s): DDIMER in the last 72 hours. Hemoglobin A1C No results for input(s): HGBA1C in the last 72 hours. Fasting Lipid Panel No results for input(s): CHOL, HDL, LDLCALC, TRIG, CHOLHDL, LDLDIRECT in the last 72 hours. Thyroid Function Tests No results for input(s): TSH, T4TOTAL, T3FREE, THYROIDAB in the last 72 hours.  Invalid input(s): FREET3  Other results:   Imaging    CT ABDOMEN PELVIS WO CONTRAST  Result Date: 12/06/2019 CLINICAL DATA:  Bowel obstruction suspected. Question bowel obstruction. History of diabetes and chronic kidney disease. EXAM: CT ABDOMEN AND PELVIS WITHOUT CONTRAST TECHNIQUE:  Multidetector CT imaging of the abdomen and pelvis was performed following the standard protocol without IV contrast. COMPARISON:  Renal ultrasound and portable abdominal radiographs same date. FINDINGS: Lower chest: Swan-Ganz catheter and enteric tube are in place. The heart is mildly enlarged. There are trace bilateral pleural effusions with dependent opacities in both lower lobes, probably atelectasis, although potentially aspiration. Hepatobiliary: The liver appears unremarkable without focal abnormality on noncontrast imaging. There is high density material within the gallbladder lumen, likely vicarious excretion of contrast. No evidence of gallstones, gallbladder wall thickening or biliary dilatation. Pancreas: Unremarkable. No pancreatic ductal dilatation or surrounding inflammatory changes. Spleen: Normal in size without focal abnormality. Adrenals/Urinary Tract: Both adrenal glands appear normal. Both native kidneys demonstrate marked atrophy and cortical thinning. Renal transplant in the right iliac fossa measures up to 12.7 cm in length and demonstrates a 3 mm nonobstructing calculus in its lower pole. There is no evidence of hydronephrosis, ureteral calculus or perinephric fluid collection. The bladder is decompressed by a Foley catheter. Stomach/Bowel: Enteric contrast was administered and has passed into the mid small bowel. Nasogastric to terminates in the mid stomach. The stomach is mildly distended. The proximal small bowel is decompressed. There is mild diffuse mid to distal small bowel dilatation most consistent with an ileus. There is no focal bowel wall thickening or surrounding inflammatory change. Gas and stool are present throughout the colon, extending into rectum. There is prominent stool in the rectum. The appendix is not visualized. Vascular/Lymphatic: There are no enlarged abdominal or pelvic lymph nodes. Mild aortic and branch vessel atherosclerosis. Reproductive: The prostate gland and  seminal vesicles appear normal. Other: Intact abdominal wall. Mildly prominent asymmetric fat in the left inguinal canal. No ascites, free air or focal extraluminal fluid collection. Musculoskeletal: No acute or significant osseous findings. Moderate degenerative changes at the hips, right greater than left. IMPRESSION: 1. Mild diffuse bowel dilatation most consistent with an ileus. No evidence of bowel obstruction or perforation. 2. Nonobstructing 3 mm calculus in the lower pole of the right iliac fossa renal transplant. No evidence of hydronephrosis, ureteral calculus or perinephric fluid collection. Markedly atrophied native kidneys. 3. Dependent opacities in both lower lobes, probably atelectasis, although potentially aspiration. 4. Aortic Atherosclerosis (ICD10-I70.0). Electronically Signed   By: Richardean Sale M.D.   On: 12/06/2019 15:45   DG Abd Portable 1V  Result Date: 12/06/2019 CLINICAL DATA:  NG tube placement EXAM: PORTABLE ABDOMEN - 1 VIEW COMPARISON:  None. FINDINGS: Enteric tube tip is within the stomach. Side port appears near the gastroesophageal junction. Partially imaged air-filled loops of bowel. IMPRESSION: Enteric tube tip is within the stomach, side port near the gastroesophageal junction. Could be advanced for optimal positioning. Electronically Signed   By: Macy Mis M.D.   On: 12/06/2019 11:16  Medications:     Scheduled Medications: . aspirin  81 mg Per Tube Daily  . Chlorhexidine Gluconate Cloth  6 each Topical Daily  . furosemide  40 mg Intravenous BID  . insulin aspart  0-9 Units Subcutaneous Q4H  . insulin detemir  10 Units Subcutaneous Daily  . metoCLOPramide (REGLAN) injection  5 mg Intravenous TID  . pantoprazole (PROTONIX) IV  40 mg Intravenous Q24H  . predniSONE  5 mg Per Tube Q breakfast  . rosuvastatin  40 mg Per Tube Daily  . sodium chloride flush  3 mL Intravenous Q12H  . tacrolimus  3 mg Per Tube BID  . ticagrelor  90 mg Per Tube BID     Infusions: . sodium chloride    . sodium chloride    . sodium chloride    . sodium chloride    . albumin human    . amiodarone 30 mg/hr (12/07/19 0700)  . epinephrine    . fentaNYL infusion INTRAVENOUS Stopped (12/06/19 0750)  . heparin 1,700 Units/hr (12/07/19 0700)  . lactated ringers    . milrinone 0.25 mcg/kg/min (12/07/19 0700)  . norepinephrine (LEVOPHED) Adult infusion Stopped (12/06/19 1448)  . piperacillin-tazobactam (ZOSYN)  IV 12.5 mL/hr at 12/07/19 0700  . vasopressin Stopped (12/07/19 0510)    PRN Medications: sodium chloride  Assessment/Plan   1. Shock: Ischemic cardiomyopathy with EF 20-25% post-late presentation anterior MI.  concern for cardiogenic shock but possible component of septic shock as well (gut source).  Excellent cardiac output by thermodilution and co-ox.  CVP about 11 today.  He is now off pressors, remains on milrinone 0.25.   - Continue milrinone 0.25 for now.  - Lasix 40 mg IV bid today.  - Not candidate for Impella at this point with developing LV thrombus on TEE. - If stabilizes and creatinine comes down, consider PCI to LCx system.  2. CAD: Delayed presentation anterior MI (CP began 10/15, PCI on 10/17).  He had DES to LAD, has residual severe up to 99% mid to distal RCA stenosis that is not revascularizable.  He has 95% mid-distal LCx stenosis and 80% OM3 stenosis that could be intervened upon. No chest pain.  - He is on Brilinta 90 bid + ASA 81 + statin post-MI.  - As above, will ideally have eventual PCI to LCx system.  3. AKI on CKD stage 3: Patient has history of renal transplant at [facility] in 2011.  Baseline creatinine 1.4, up to 4.48 this admission.  Now trending down, 3.62 today with improving UOP.  Suspect combination of contrast-induced nephropathy and cardiorenal syndrome in setting of cardiogenic shock.   - Tacrolimus dose has been decreased, level sent today from [facility].  Pharmacy to call and try to locate result.   - Nephrology now  following.  - Support cardiac output and MAP.  - Lasix today as above.  4. Elevated LFTs: Mild elevation, suspect shock liver with hypotension/cardiogenic shock. Trending down.  5. DM2: SSI.  6. Abdominal pain: Diffuse mild tenderness.  Plain films with ileus. No BM since last week.  Abdominal CT with ileus.  - Starting IV Reglan 3 doses today.  - Mobilize out of bed.  - Will cover empirically with antibiotics for now, Zosyn.  7. Sludge LV apex/early thrombus: He is on heparin gtt.  8. ID: Tm 99.1 and WBCs down to 9.6.  PCT high at 38.  ?Component of septic shock from gut source given ileus/symptoms, covering for now with Zosyn.  - Continue Zosyn.  CRITICAL CARE Performed by: Loralie Champagne  Total critical care time: 40 minutes  Critical care time was exclusive of separately billable procedures and treating other patients.  Critical care was necessary to treat or prevent imminent or life-threatening deterioration.  Critical care was time spent personally by me on the following activities: development of treatment plan with patient and/or surrogate as well as nursing, discussions with consultants, evaluation of patient's response to treatment, examination of patient, obtaining history from patient or surrogate, ordering and performing treatments and interventions, ordering and review of laboratory studies, ordering and review of radiographic studies, pulse oximetry and re-evaluation of patient's condition.   Length of Stay: 2  Loralie Champagne, MD  12/07/2019, 8:36 AM  Advanced Heart Failure Team Pager [phone] (M-F; 7a - 4p)  Please contact [facility] for night-coverage after hours (4p -7a ) and weekends on [email or web address]

## 2019-12-07 NOTE — Progress Notes (Signed)
Pt currently hospitalized but has 3 different forms that need to be completed:   1. FMLA forms 2. [employer] Critical illness form 3. [employer] Disability form  All 3 completed and signed by Dr Aundra Dubin, pt's wife is aware and will pick them up at the front desk.

## 2019-12-07 NOTE — Progress Notes (Signed)
ANTICOAGULATION CONSULT NOTE  Pharmacy Consult for heparin Indication: apical thrombus  No Known Allergies  Patient Measurements: Height: 6\' 1"  (185.4 cm) Weight: 96.1 kg (211 lb 13.8 oz) IBW/kg (Calculated) : 79.9 Heparin Dosing Weight: 92 kg  Vital Signs: Temp: 98.8 F (37.1 C) (10/21 1100) Temp Source: Core (10/21 0600) BP: 158/55 (10/21 0900) Pulse Rate: 96 (10/21 1100)  Labs: Recent Labs    12/05/19 0725 12/05/19 0725 12/05/19 1342 12/05/19 1548 12/05/19 1714 12/05/19 1955 12/06/19 0246 12/06/19 0858 12/07/19 0338 12/07/19 0359  HGB 14.4   < >  --    < > 14.6  --  11.5*  --  10.8*  --   HCT 43.1  --   --    < > 43.0  --  35.1*  --  31.9*  --   PLT 316  --   --   --   --   --  346  --  220  --   LABPROT  --   --   --   --   --  15.3*  --   --   --   --   INR  --   --   --   --   --  1.3*  --   --   --   --   HEPARINUNFRC 0.42   < >  --   --   --   --  <0.10* 0.18*  --  0.42  CREATININE 3.10*  --   --   --   --   --  4.48*  --  3.62*  --   TROPONINIHS  --   --  >27,000*  --   --  >27,000*  --   --   --   --    < > = values in this interval not displayed.    Estimated Creatinine Clearance: 26.9 mL/min (A) (by C-G formula based on SCr of 3.62 mg/dL (H)).   Medical History: Past Medical History:  Diagnosis Date  . Chronic kidney disease 04/2009   Kidney Transplant  . Diabetes mellitus   . GERD (gastroesophageal reflux disease)    as needed reflux  . Hypertension   . 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     Assessment: 59 year old male presented with STEMI s/p PCI to mid LAD. Pt noted to have apical smoke on ECHO concerning for early thrombus. Pharmacy asked to continue heparin for now upon  transfer from [facility]. 10/19 Concern for stroke post RHC d/t unequal pupil size - per patient this is old, head CT negative for bleeding. Heparin restarted at prior rate. LV thrombus noted on echo, impella cancelled.   Heparin drip rate  increased 10/20 to  1700 uts/hr heaprin level this am therapeutic at 0.4, no bleeding noted, h/h stable   Goal of Therapy:  Heparin level 0.3-0.7 units/ml Monitor platelets by anticoagulation protocol: Yes   Plan:  Continue heparin drip rate 1700 units/h -Daily heparin level and CBC   Bonnita Nasuti Pharm.D. CPP, BCPS Clinical Pharmacist [phone] 12/07/2019 11:40 AM

## 2019-12-07 NOTE — Progress Notes (Signed)
[facility] Progress Note    Assessment/ Plan:   1. Acute kidney Injury (oliguric) secondary to cardiogenic shock and diminished perfusion from hypotension (ATN picture) with possible contrast induced injury -at this junction, there is no absolute indication for renal replacement therapy.  Renal function is improving and urine output is better.  Diuresis per cardiology -maintain MAP >65 to ensure adequate perfusion -Avoid nephrotoxic medications including NSAIDs and iodinated intravenous contrast exposure unless the latter is absolutely indicated.  Preferred narcotic agents for pain control are hydromorphone, fentanyl, and methadone. Morphine should not be used. Avoid Baclofen and avoid oral sodium phosphate and magnesium citrate based laxatives / bowel preps. Continue strict Input and Output monitoring. 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 Marval Regal), underlying disease=HTN and DM. Continue with tacrolimus and home prednisone dose. 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 If needed can use stress dose steroids if persistently hypotensive. Baseline creatinine ~1.3. Tacrolimus trough pending, sent 10/19 (goal tacrolimus trough 5-7). Transplant ultrasound reviewed 3. AGMA, likely related to AKI, if worsening repeat lactate levels. Starting sodium bicarb 1300mg  TID 4. Shock, cardiogenic +/- septic shock: on zosyn, improved now off all pressors, only on milrinone 5. Ischemic cardiomyopathy, EF 20-25%. Diurese as needed. Not a candidate for impella given LV thrombus.  To be receiving Lasix today 6. Anterior STEMI, s/p LHC 10/17 7. LV thrombus: on hep gtt 8. Hyponatremia: corrected Na 135 9. Abdominal pain: ileus?  Noncon CT revealed ileus and nonobstructing 3 mm calculus in the lower pole of the right iliac fossa renal transplant 10.  Leukocytosis: on zosyn   Subjective:   Extubated, urine output improving (2.4L), cr down to 3.6. off pressors. Feels much better, no complaints currently   Objective:   BP (!) 149/73   Pulse 96   Temp 98.4 F (36.9 C)   Resp (!) 33   Ht 6\' 1"  (1.854 m)   Wt 96.1 kg   SpO2 96%   BMI 27.95 kg/m   Intake/Output Summary (Last 24 hours) at 12/07/2019 1003 Last data filed at 12/07/2019 0700 Gross per 24 hour  Intake 1255.37 ml  Output 2595 ml  Net -1339.63 ml   Weight change: 0 kg  Physical Exam: Gen:nad [id number], + systolic murmur, no r/g, rrr Resp:cta bl, bl chest expansion Abd: Slightly distended, nontender, soft Ext:no edema Neuro: awake, alert, following commands, answering questions  Imaging: CT ABDOMEN PELVIS WO CONTRAST  Result Date: 12/06/2019 CLINICAL DATA:  Bowel obstruction suspected. Question bowel obstruction. History of diabetes and chronic kidney disease. EXAM: CT ABDOMEN AND PELVIS WITHOUT CONTRAST TECHNIQUE: Multidetector CT imaging of the abdomen and pelvis was performed following the standard protocol without IV contrast. COMPARISON:  Renal ultrasound and portable abdominal radiographs same date. FINDINGS: Lower chest: Swan-Ganz catheter and enteric tube are in place. The heart is mildly enlarged. There are trace bilateral pleural effusions with dependent opacities in both lower lobes, probably atelectasis, although potentially aspiration. Hepatobiliary: The liver appears unremarkable without focal abnormality on noncontrast imaging. There is high density material within the gallbladder lumen, likely vicarious excretion of contrast. No evidence of gallstones, gallbladder wall thickening or biliary dilatation. Pancreas: Unremarkable. No pancreatic ductal dilatation or surrounding inflammatory changes. Spleen: Normal in size without focal abnormality. Adrenals/Urinary Tract: Both adrenal glands appear normal. Both native kidneys demonstrate marked atrophy and  cortical thinning. Renal transplant in the right  iliac fossa measures up to 12.7 cm in length and demonstrates a 3 mm nonobstructing calculus in its lower pole. There is no evidence of hydronephrosis, ureteral calculus or perinephric fluid collection. The bladder is decompressed by a Foley catheter. Stomach/Bowel: Enteric contrast was administered and has passed into the mid small bowel. Nasogastric to terminates in the mid stomach. The stomach is mildly distended. The proximal small bowel is decompressed. There is mild diffuse mid to distal small bowel dilatation most consistent with an ileus. There is no focal bowel wall thickening or surrounding inflammatory change. Gas and stool are present throughout the colon, extending into rectum. There is prominent stool in the rectum. The appendix is not visualized. Vascular/Lymphatic: There are no enlarged abdominal or pelvic lymph nodes. Mild aortic and branch vessel atherosclerosis. Reproductive: The prostate gland and seminal vesicles appear normal. Other: Intact abdominal wall. Mildly prominent asymmetric fat in the left inguinal canal. No ascites, free air or focal extraluminal fluid collection. Musculoskeletal: No acute or significant osseous findings. Moderate degenerative changes at the hips, right greater than left. IMPRESSION: 1. Mild diffuse bowel dilatation most consistent with an ileus. No evidence of bowel obstruction or perforation. 2. Nonobstructing 3 mm calculus in the lower pole of the right iliac fossa renal transplant. No evidence of hydronephrosis, ureteral calculus or perinephric fluid collection. Markedly atrophied native kidneys. 3. Dependent opacities in both lower lobes, probably atelectasis, although potentially aspiration. 4. Aortic Atherosclerosis (ICD10-I70.0). Electronically Signed   By: Richardean Sale M.D.   On: 12/06/2019 15:45   US Renal Transplant w/Doppler  Result Date: 12/06/2019 CLINICAL DATA:  59 year old male with acute renal  insufficiency. Right lower quadrant transplant kidney. EXAM: ULTRASOUND OF RENAL TRANSPLANT WITH RENAL DOPPLER ULTRASOUND TECHNIQUE: Ultrasound examination of the renal transplant was performed with gray-scale, color and duplex doppler evaluation. COMPARISON:  Ultrasound dated 07/05/2017. FINDINGS: Transplant kidney location: Right lower quadrant Transplant Kidney: Renal measurements: 11.3 x 8.2 x 6.3 = volume: 375mL. Normal in size and parenchymal echogenicity. No evidence of mass or hydronephrosis. No peri-transplant fluid collection seen. Color flow in the main renal artery:  Yes Color flow in the main renal vein:  Yes Duplex Doppler Evaluation: Main Renal Artery Velocity: 99 cm/sec Main Renal Artery Resistive Index: 0.79 (previously 0.79). Venous waveform in main renal vein:  Present Intrarenal resistive index in upper pole:  0.83 (previously 0.64) (normal 0.6-0.8; equivocal 0.8-0.9; abnormal >= 0.9) Intrarenal resistive index in lower pole: 0.69 (previously 0.66) (normal 0.6-0.8; equivocal 0.8-0.9; abnormal >= 0.9) Bladder: Decompressed around a Foley catheter. Other findings:  None. IMPRESSION: 1. No hydronephrosis or peritransplant collection. 2. Borderline elevated resistive index in the upper pole of the transplant kidney. 3. The transplant main renal artery and vein are patent. Electronically Signed   By: Anner Crete M.D.   On: 12/06/2019 02:08   CARDIAC CATHETERIZATION  Result Date: 12/05/2019 1. Filling pressures normal to low. 2. Cardiac output is low at 2.06 by Fick and thermodilution on milrinone 0.25 mcg/kg/min. Discussed findings with Dr. Prescott Gum.  Patient is tachycardic and mildly hypotension with SBP upper 80s/90s on milrinone 0.25 mcg/kg/min.  While his cardiac output on milrinone is not markedly low, his hemodynamics are marginal.  He is on inotrope now s/p large MI with residual severe coronary disease.  We decided that the best plan for him would be Impella 5.5 to allow unloading  of LV and to limit the need for inotropes/pressors. He has an ileus, will cover with Zosyn IV for now and  will get CT abdomen/pelvis at some point post-Impella.   Port CXR  Result Date: 12/06/2019 CLINICAL DATA:  Intubation.  Swan-Ganz catheter EXAM: PORTABLE CHEST 1 VIEW COMPARISON:  12/05/2019. FINDINGS: Endotracheal tube, NG tube, Swan-Ganz catheter in stable position. Stable cardiomegaly. No pulmonary venous congestion. Low lung volumes with basilar atelectasis. No focal infiltrate. No pleural effusion or pneumothorax. Left subclavian stents again noted. These are in an angled position and appear unchanged from 12/05/1999. IMPRESSION: 1. Lines and tubes in stable position. 2. Stable cardiomegaly. 3. Low lung volumes with bibasilar atelectasis. Electronically Signed   By: Marcello Moores  Register   On: 12/06/2019 07:21   DG Chest Port 1 View  Result Date: 12/05/2019 CLINICAL DATA:  STEMI EXAM: PORTABLE CHEST 1 VIEW COMPARISON:  12/03/2019 FINDINGS: Swan-Ganz catheter is in the central right pulmonary artery. NG tube is in the stomach. Endotracheal tube is 5 cm above the carina. Heart is normal size. Lungs clear. No effusions or pneumothorax. IMPRESSION: Support devices in expected position as above. No acute cardiopulmonary disease. Electronically Signed   By: Rolm Baptise M.D.   On: 12/05/2019 19:16   DG Abd Portable 1V  Result Date: 12/06/2019 CLINICAL DATA:  NG tube placement EXAM: PORTABLE ABDOMEN - 1 VIEW COMPARISON:  None. FINDINGS: Enteric tube tip is within the stomach. Side port appears near the gastroesophageal junction. Partially imaged air-filled loops of bowel. IMPRESSION: Enteric tube tip is within the stomach, side port near the gastroesophageal junction. Could be advanced for optimal positioning. Electronically Signed   By: Macy Mis M.D.   On: 12/06/2019 11:16   ECHO INTRAOPERATIVE TEE  Result Date: 12/06/2019  *INTRAOPERATIVE TRANSESOPHAGEAL REPORT *  Patient Name:   Casey [name]. Date of Exam: 12/05/2019 Medical Rec #:  [id number]           Height:       73.0 in Accession #:    [id number]          Weight:       208.3 lb Date of Birth:  26-Dec-1960            BSA:          2.19 m Patient Age:    37 years            BP:           106/89 mmHg Patient Gender: M                   HR:           120 bpm. Exam Location:  Inpatient Transesophogeal exam was perform intraoperatively during surgical procedure. Patient was closely monitored under general anesthesia during the entirety of examination. Indications:     impella placement Performing Phys: [place] TRIGT Diagnosing Phys: Belenda Cruise Stoltzfus Complications: No known complications during this procedure. SUMMARY  Diffuse smoke in LV making elucidation of endocardial borders difficult (definity contrast used in pre-op study). Early hyperechoic poorly organized likely apical thrombus noted. Surgeon aware and cardiology reviewed intraop images with agreement of likely findings. Planned placement of impella device aborted at that time.  PRE-OP FINDINGS  Left Ventricle: The left ventricle has severely reduced systolic function, with an ejection fraction of 20-25%. The cavity size was mildly dilated. Left ventrical global hypokinesis with regonal wall motion abnormalities. Left ventricular diffuse hypokinesis. There is a small, mobile, apical left ventricular thrombus. The thrombus appears irregular in shape and hyperechoic in texture. Right Ventricle: The right ventricle has normal  systolic function. The cavity was normal. There is no increase in right ventricular wall thickness. Right ventricular systolic pressure is normal. Left Atrium: Left atrial size was normal in size. The left atrial appendage is well visualized and there is no evidence of thrombus present. Left atrial appendage velocity is normal at greater than 40 cm/s. Right Atrium: Right atrial size was normal in size. Interatrial Septum: No atrial level shunt detected by color  flow Doppler. Pericardium: A small pericardial effusion is present. The pericardial effusion is circumferential. There is no pleural effusion. Mitral Valve: The mitral valve is normal in structure. Mitral valve regurgitation is not visualized by color flow Doppler. There is no evidence of mitral valve vegetation. There is No evidence of mitral stenosis. Tricuspid Valve: The tricuspid valve was normal in structure. Tricuspid valve regurgitation is trivial by color flow Doppler. The jet is directed centrally. There is no evidence of tricuspid valve vegetation. Aortic Valve: The aortic valve is tricuspid Aortic valve regurgitation was not visualized by color flow Doppler. There is no stenosis of the aortic valve, with a calculated valve area of 2.86 cm. There is no evidence of a vegetation on the aortic valve. Pulmonic Valve: The pulmonic valve was normal in structure No evidence of pumonic stenosis. Pulmonic valve regurgitation is not visualized by color flow Doppler. Aorta: The ascending aorta are normal in size and structure. Pulmonary Artery: The pulmonary artery is of normal size. Venous: The inferior vena cava is normal in size with greater than 50% respiratory variability, suggesting right atrial pressure of 3 mmHg. Shunts: No residual ventricular septal defect is detected by 2D or color Doppler. +--------------+--------++ LEFT VENTRICLE         +----------------+---------++ +--------------+--------++ Diastology                PLAX 2D                +----------------+---------++ +--------------+--------++ LV e' lateral:  7.31 cm/s LVIDd:        4.12 cm  +----------------+---------++ +--------------+--------++ LV E/e' lateral:5.0       LVIDs:        3.76 cm  +----------------+---------++ +--------------+--------++ LV e' medial:   7.51 cm/s LV PW:        1.01 cm  +----------------+---------++ +--------------+--------++ LV E/e' medial: 4.9       LV IVS:       1.34 cm   +----------------+---------++ +--------------+--------++ LVOT diam:    1.90 cm  +--------------+--------++ LV SV:        15 ml    +--------------+--------++ LV SV Index:  6.61     +--------------+--------++ LVOT Area:    2.84 cm +--------------+--------++                        +--------------+--------++ +------------------+-----------++ AORTIC VALVE                  +------------------+-----------++ AV Area (Vmax):   2.38 cm    +------------------+-----------++ AV Area (Vmean):  2.49 cm    +------------------+-----------++ AV Area (VTI):    2.86 cm    +------------------+-----------++ AV Vmax:          131.00 cm/s +------------------+-----------++ AV Vmean:         86.900 cm/s +------------------+-----------++ AV VTI:           0.142 m     +------------------+-----------++ AV Peak Grad:     6.9 mmHg    +------------------+-----------++ AV Mean Grad:  4.0 mmHg    +------------------+-----------++ LVOT Vmax:        110.00 cm/s +------------------+-----------++ LVOT Vmean:       76.200 cm/s +------------------+-----------++ LVOT VTI:         0.143 m     +------------------+-----------++ LVOT/AV VTI ratio:1.01        +------------------+-----------++  +--------------+-------++ AORTA                 +--------------+-------++ Ao Sinus diam:2.88 cm +--------------+-------++ Ao STJ diam:  2.4 cm  +--------------+-------++ Ao Asc diam:  2.74 cm +--------------+-------++ +-------------+---------++ MITRAL VALVE             +--------------+-------+ +-------------+---------++   SHUNTS                MV Peak grad:2.5 mmHg    +--------------+-------+ +-------------+---------++   Systemic VTI: 0.14 m  MV Mean grad:1.0 mmHg    +--------------+-------+ +-------------+---------++   Systemic Diam:1.90 cm MV Vmax:     0.78 m/s    +--------------+-------+ +-------------+---------++ MV Vmean:    51.5 cm/s  +-------------+---------++ MV VTI:      0.12 m    +-------------+---------++ +--------------+----------++ MV E velocity:36.80 cm/s +--------------+----------++ MV A velocity:63.40 cm/s +--------------+----------++ MV E/A ratio: 0.58       +--------------+----------++  Rochele Pages Electronically signed by Rochele Pages Signature Date/Time: 12/06/2019/10:31:01 AM    Final    CT HEAD CODE STROKE WO CONTRAST  Result Date: 12/05/2019 CLINICAL DATA:  Code stroke.  59 year old male with unequal pupils. EXAM: CT HEAD WITHOUT CONTRAST TECHNIQUE: Contiguous axial images were obtained from the base of the skull through the vertex without intravenous contrast. COMPARISON:  Head CT 03/15/2013. FINDINGS: Brain: Stable cerebral volume since 2015, within normal limits for age. Age indeterminate hypodensities in the lateral right thalamus and the right caudate (series 3, image 16), new from 50. Gray-white matter differentiation elsewhere is stable and within normal limits. No midline shift, ventriculomegaly, mass effect, evidence of mass lesion, intracranial hemorrhage or evidence of cortically based acute infarction. Vascular: Calcified atherosclerosis at the skull base. No suspicious intracranial vascular hyperdensity. Skull: Stable, negative. Sinuses/Orbits: Visualized paranasal sinuses and mastoids are clear. Other: Visualized orbits and scalp soft tissues are within normal limits. ASPECTS [facility] Stroke Program Early CT Score) Total score (0-10 with 10 being normal): 10 IMPRESSION: 1. Age indeterminate lacunar infarcts in the right thalamus and right caudate, new since 2015. 2. No acute cortically based infarct or intracranial hemorrhage identified. ASPECTS 10. 3. These results were communicated to Dr. Erlinda Hong At 7:25 pm on 12/05/2019 by text page via the [facility] messaging system. Electronically Signed   By: Genevie Ann M.D.   On: 12/05/2019 19:25    Labs: BMET Recent Labs  Lab 12/03/19 1956  12/03/19 1956 12/04/19 0034 12/04/19 0034 12/04/19 1245 12/05/19 0725 12/05/19 1548 12/05/19 1549 12/05/19 1714 12/06/19 0246 12/07/19 0338  NA 136   < > 134*   < > 135 132* 135 133* 135 131* 134*  K 4.7   < > 4.8   < > 4.9 4.6 4.1 4.3 4.3 4.3 4.1  CL 100  --  102  --  103 99  --   --   --  100 104  CO2 25  --  22  --  21* 19*  --   --   --  18* 17*  GLUCOSE 221*  --  228*  --  207* 244*  --   --   --  354* 174*  BUN  18  --  20  --  30* 53*  --   --   --  70* 58*  CREATININE 1.46*  --  1.53*  --  1.86* 3.10*  --   --   --  4.48* 3.62*  CALCIUM 10.3  --  9.5  --  9.7 9.8  --   --   --  8.6* 9.3  PHOS  --   --  2.9  --   --   --   --   --   --   --   --    < > = values in this interval not displayed.   CBC Recent Labs  Lab 12/04/19 0035 12/04/19 0035 12/05/19 0725 12/05/19 1548 12/05/19 1549 12/05/19 1714 12/06/19 0246 12/07/19 0338  WBC 22.9*  --  24.0*  --   --   --  17.9* 9.6  NEUTROABS 18.5*  --   --   --   --   --   --   --   HGB 15.3   < > 14.4   < > 14.3 14.6 11.5* 10.8*  HCT 46.1   < > 43.1   < > 42.0 43.0 35.1* 31.9*  MCV 83.4  --  83.0  --   --   --  85.2 82.9  PLT 314  --  316  --   --   --  346 220   < > = values in this interval not displayed.    Medications:    . aspirin  81 mg Per Tube Daily  . Chlorhexidine Gluconate Cloth  6 each Topical Daily  . furosemide  40 mg Intravenous BID  . insulin aspart  0-9 Units Subcutaneous Q4H  . insulin detemir  10 Units Subcutaneous Daily  . metoCLOPramide (REGLAN) injection  5 mg Intravenous TID  . pantoprazole (PROTONIX) IV  40 mg Intravenous Q24H  . predniSONE  5 mg Per Tube Q breakfast  . rosuvastatin  40 mg Per Tube Daily  . sodium chloride flush  3 mL Intravenous Q12H  . tacrolimus  3 mg Per Tube BID  . ticagrelor  90 mg Per Tube BID      Gean Quint, MD [facility] 12/07/2019, 10:03 AM

## 2019-12-08 LAB — CBC
HCT: 37.6 % — ABNORMAL LOW (ref 39.0–52.0)
Hemoglobin: 12.6 g/dL — ABNORMAL LOW (ref 13.0–17.0)
MCH: 27.6 pg (ref 26.0–34.0)
MCHC: 33.5 g/dL (ref 30.0–36.0)
MCV: 82.5 fL (ref 80.0–100.0)
Platelets: 304 K/uL (ref 150–400)
RBC: 4.56 MIL/uL (ref 4.22–5.81)
RDW: 14.1 % (ref 11.5–15.5)
WBC: 12.2 K/uL — ABNORMAL HIGH (ref 4.0–10.5)
nRBC: 0 % (ref 0.0–0.2)

## 2019-12-08 LAB — COOXEMETRY PANEL
Carboxyhemoglobin: 0.7 % (ref 0.5–1.5)
Methemoglobin: 0.9 % (ref 0.0–1.5)
O2 Saturation: 62.2 %
Total hemoglobin: 18.6 g/dL — ABNORMAL HIGH (ref 12.0–16.0)

## 2019-12-08 LAB — GLUCOSE, CAPILLARY
Glucose-Capillary: 166 mg/dL — ABNORMAL HIGH (ref 70–99)
Glucose-Capillary: 172 mg/dL — ABNORMAL HIGH (ref 70–99)
Glucose-Capillary: 193 mg/dL — ABNORMAL HIGH (ref 70–99)
Glucose-Capillary: 206 mg/dL — ABNORMAL HIGH (ref 70–99)
Glucose-Capillary: 208 mg/dL — ABNORMAL HIGH (ref 70–99)
Glucose-Capillary: 221 mg/dL — ABNORMAL HIGH (ref 70–99)

## 2019-12-08 LAB — PROCALCITONIN: Procalcitonin: 33.19 ng/mL

## 2019-12-08 LAB — HEPARIN LEVEL (UNFRACTIONATED): Heparin Unfractionated: 0.36 [IU]/mL (ref 0.30–0.70)

## 2019-12-08 LAB — COMPREHENSIVE METABOLIC PANEL WITH GFR
ALT: 26 U/L (ref 0–44)
AST: 24 U/L (ref 15–41)
Albumin: 2.4 g/dL — ABNORMAL LOW (ref 3.5–5.0)
Alkaline Phosphatase: 106 U/L (ref 38–126)
Anion gap: 14 (ref 5–15)
BUN: 58 mg/dL — ABNORMAL HIGH (ref 6–20)
CO2: 21 mmol/L — ABNORMAL LOW (ref 22–32)
Calcium: 9.7 mg/dL (ref 8.9–10.3)
Chloride: 104 mmol/L (ref 98–111)
Creatinine, Ser: 4.09 mg/dL — ABNORMAL HIGH (ref 0.61–1.24)
GFR, Estimated: 16 mL/min — ABNORMAL LOW
Glucose, Bld: 173 mg/dL — ABNORMAL HIGH (ref 70–99)
Potassium: 3.7 mmol/L (ref 3.5–5.1)
Sodium: 139 mmol/L (ref 135–145)
Total Bilirubin: 0.9 mg/dL (ref 0.3–1.2)
Total Protein: 6.9 g/dL (ref 6.5–8.1)

## 2019-12-08 MED ORDER — NEOSTIGMINE METHYLSULFATE 3 MG/3ML IV SOSY
0.2500 mg | PREFILLED_SYRINGE | Freq: Four times a day (QID) | INTRAVENOUS | Status: AC
  Administered 2019-12-08 – 2019-12-09 (×5): 0.25 mg via SUBCUTANEOUS
  Filled 2019-12-08 (×13): qty 3

## 2019-12-08 MED ORDER — ALPRAZOLAM 0.5 MG PO TABS
0.5000 mg | ORAL_TABLET | Freq: Every evening | ORAL | Status: DC | PRN
  Administered 2019-12-08 – 2019-12-13 (×2): 0.5 mg via NASOGASTRIC
  Filled 2019-12-08 (×3): qty 1

## 2019-12-08 MED ORDER — SORBITOL 70 % SOLN
30.0000 mL | Freq: Once | Status: AC
  Administered 2019-12-08: 30 mL
  Filled 2019-12-08: qty 30

## 2019-12-08 MED ORDER — POTASSIUM CHLORIDE 10 MEQ/100ML IV SOLN
10.0000 meq | INTRAVENOUS | Status: AC
  Administered 2019-12-08 (×2): 10 meq via INTRAVENOUS
  Filled 2019-12-08 (×2): qty 100

## 2019-12-08 MED ORDER — NEOSTIGMINE METHYLSULFATE 10 MG/10ML IV SOLN
0.2500 mg | Freq: Four times a day (QID) | INTRAVENOUS | Status: DC
  Filled 2019-12-08 (×2): qty 0.25

## 2019-12-08 MED ORDER — PHENOL 1.4 % MT LIQD
1.0000 | OROMUCOSAL | Status: DC | PRN
  Administered 2019-12-08: 1 via OROMUCOSAL
  Filled 2019-12-08: qty 177

## 2019-12-08 NOTE — Progress Notes (Signed)
ANTICOAGULATION CONSULT NOTE  Pharmacy Consult for heparin Indication: apical thrombus  No Known Allergies  Patient Measurements: Height: 6\' 1"  (185.4 cm) Weight: 89.8 kg (197 lb 15.6 oz) IBW/kg (Calculated) : 79.9 Heparin Dosing Weight: 92 kg  Vital Signs: Temp: 98.8 F (37.1 C) (10/22 1100) Temp Source: Core (10/22 0600) BP: 111/89 (10/22 1000) Pulse Rate: 101 (10/22 1100)  Labs: Recent Labs    12/05/19 1342 12/05/19 1548 12/05/19 1714 12/05/19 1955 12/06/19 0246 12/06/19 0246 12/06/19 0858 12/07/19 0338 12/07/19 0359 12/07/19 2217 12/08/19 0545  HGB  --    < >   < >  --  11.5*   < >  --  10.8*  --   --  12.6*  HCT  --    < >   < >  --  35.1*  --   --  31.9*  --   --  37.6*  PLT  --   --   --   --  346  --   --  220  --   --  304  LABPROT  --   --   --  15.3*  --   --   --   --   --   --   --   INR  --   --   --  1.3*  --   --   --   --   --   --   --   HEPARINUNFRC  --   --   --   --  <0.10*   < > 0.18*  --  0.42  --  0.36  CREATININE  --   --   --   --  4.48*   < >  --  3.62*  --  3.83* 4.09*  TROPONINIHS >27,000*  --   --  >27,000*  --   --   --   --   --   --   --    < > = values in this interval not displayed.    Estimated Creatinine Clearance: 22 mL/min (A) (by C-G formula based on SCr of 4.09 mg/dL (H)).   Medical History: Past Medical History:  Diagnosis Date  . Chronic kidney disease 04/2009   Kidney Transplant  . Diabetes mellitus   . GERD (gastroesophageal reflux disease)    as needed reflux  . Hypertension   . 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     Assessment: 59 year old male presented with STEMI s/p PCI to mid LAD. Pt noted to have apical smoke on ECHO concerning for early thrombus. Pharmacy asked to continue heparin for now upon  transfer from [facility]. 10/19 Concern for stroke post RHC d/t unequal pupil size - per patient this is old, head CT negative for bleeding. Heparin restarted at prior rate. LV  thrombus noted on echo, impella cancelled.   Heparin drip rate increased 10/20 to 1700 uts/hr heaprin level this am therapeutic at 0.36, no bleeding noted, h/h stable. When GI issues resolve and procedures completed - will f/u for oral anticoagulation  Goal of Therapy:  Heparin level 0.3-0.7 units/ml Monitor platelets by anticoagulation protocol: Yes   Plan:  Continue heparin drip rate 1700 units/h -Daily heparin level and CBC   Bonnita Nasuti Pharm.D. CPP, BCPS Clinical Pharmacist [phone] 12/08/2019 11:46 AM

## 2019-12-08 NOTE — Progress Notes (Signed)
[facility] Progress Note    Assessment/ Plan:   1. Acute kidney Injury (oliguric) secondary to cardiogenic shock and diminished perfusion from hypotension (ATN picture) with possible contrast induced injury -at this junction, there is no absolute indication for renal replacement therapy.  Renal function is stable/slightly worse than yesterday, likely related to diuresis (5.1L urine output).  Diuresis per cardiology, can likely hold on further doses today -maintain MAP >65 to ensure adequate perfusion -Avoid nephrotoxic medications including NSAIDs and iodinated intravenous contrast exposure unless the latter is absolutely indicated.  Preferred narcotic agents for pain control are hydromorphone, fentanyl, and methadone. Morphine should not be used. Avoid Baclofen and avoid oral sodium phosphate and magnesium citrate based laxatives / bowel preps. Continue strict Input and Output monitoring. 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 Marval Regal), underlying disease=HTN and DM. Continue with tacrolimus and home prednisone dose. 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 If needed can use stress dose steroids if persistently hypotensive. Baseline creatinine ~1.3. Tacrolimus trough pending, sent 10/19 (goal tacrolimus trough 5-7). Transplant ultrasound reviewed. Agree with lower tac dosing as compared to home (drug interaction with amio) 3. AGMA, likely related to AKI, if worsening repeat lactate levels. sodium bicarb 1300mg  TID 4. Shock, cardiogenic +/- septic shock: on zosyn, improved now off all pressors, only on milrinone 5. Ischemic cardiomyopathy, EF 20-25%. Diurese as needed. Not a candidate for impella given LV thrombus.  To be receiving Lasix today 6. Anterior STEMI, s/p LHC 10/17 7. LV thrombus: on hep gtt 8. Abdominal pain: ileus?  Noncon CT  revealed ileus and nonobstructing 3 mm calculus in the lower pole of the right iliac fossa renal transplant. Mgmt per primary 9. Leukocytosis: on zosyn   Subjective:   5.3L urine output, net neg ~5.2L, still having issues with ileus, no bowel movement. Receiving meds thru [facility], not sure how much is being absorbed (does have to get suctioned)   Objective:   BP 133/81   Pulse 98   Temp 98.6 F (37 C)   Resp (!) 27   Ht 6\' 1"  (1.854 m)   Wt 89.8 kg   SpO2 98%   BMI 26.12 kg/m   Intake/Output Summary (Last 24 hours) at 12/08/2019 1003 Last data filed at 12/08/2019 0700 Gross per 24 hour  Intake 1505.06 ml  Output 6240 ml  Net -4734.94 ml   Weight change: -6.3 kg  Physical Exam: Gen:nad, drowsy NTZ:[id number], + systolic murmur, no r/g, rrr Resp:cta bl, bl chest expansion Abd: distended, nontender, soft Ext:no edema Neuro: drowsy but awake, alert, following commands, answering questions  Imaging: CT ABDOMEN PELVIS WO CONTRAST  Result Date: 12/06/2019 CLINICAL DATA:  Bowel obstruction suspected. Question bowel obstruction. History of diabetes and chronic kidney disease. EXAM: CT ABDOMEN AND PELVIS WITHOUT CONTRAST TECHNIQUE: Multidetector CT imaging of the abdomen and pelvis was performed following the standard protocol without IV contrast. COMPARISON:  Renal ultrasound and portable abdominal radiographs same date. FINDINGS: Lower chest: Swan-Ganz catheter and enteric tube are in place. The heart is mildly enlarged. There are trace bilateral pleural effusions with dependent opacities in both lower lobes, probably atelectasis, although potentially aspiration. Hepatobiliary: The liver appears unremarkable without focal abnormality on noncontrast imaging. There is high density material within the gallbladder lumen, likely vicarious excretion of contrast. No evidence of gallstones, gallbladder wall thickening or biliary dilatation. Pancreas: Unremarkable. No  pancreatic ductal dilatation or  surrounding inflammatory changes. Spleen: Normal in size without focal abnormality. Adrenals/Urinary Tract: Both adrenal glands appear normal. Both native kidneys demonstrate marked atrophy and cortical thinning. Renal transplant in the right iliac fossa measures up to 12.7 cm in length and demonstrates a 3 mm nonobstructing calculus in its lower pole. There is no evidence of hydronephrosis, ureteral calculus or perinephric fluid collection. The bladder is decompressed by a Foley catheter. Stomach/Bowel: Enteric contrast was administered and has passed into the mid small bowel. Nasogastric to terminates in the mid stomach. The stomach is mildly distended. The proximal small bowel is decompressed. There is mild diffuse mid to distal small bowel dilatation most consistent with an ileus. There is no focal bowel wall thickening or surrounding inflammatory change. Gas and stool are present throughout the colon, extending into rectum. There is prominent stool in the rectum. The appendix is not visualized. Vascular/Lymphatic: There are no enlarged abdominal or pelvic lymph nodes. Mild aortic and branch vessel atherosclerosis. Reproductive: The prostate gland and seminal vesicles appear normal. Other: Intact abdominal wall. Mildly prominent asymmetric fat in the left inguinal canal. No ascites, free air or focal extraluminal fluid collection. Musculoskeletal: No acute or significant osseous findings. Moderate degenerative changes at the hips, right greater than left. IMPRESSION: 1. Mild diffuse bowel dilatation most consistent with an ileus. No evidence of bowel obstruction or perforation. 2. Nonobstructing 3 mm calculus in the lower pole of the right iliac fossa renal transplant. No evidence of hydronephrosis, ureteral calculus or perinephric fluid collection. Markedly atrophied native kidneys. 3. Dependent opacities in both lower lobes, probably atelectasis, although potentially aspiration. 4. Aortic Atherosclerosis  (ICD10-I70.0). Electronically Signed   By: Richardean Sale M.D.   On: 12/06/2019 15:45   DG Abd Portable 1V  Result Date: 12/06/2019 CLINICAL DATA:  NG tube placement EXAM: PORTABLE ABDOMEN - 1 VIEW COMPARISON:  None. FINDINGS: Enteric tube tip is within the stomach. Side port appears near the gastroesophageal junction. Partially imaged air-filled loops of bowel. IMPRESSION: Enteric tube tip is within the stomach, side port near the gastroesophageal junction. Could be advanced for optimal positioning. Electronically Signed   By: Macy Mis M.D.   On: 12/06/2019 11:16    Labs: BMET Recent Labs  Lab 12/04/19 0034 12/04/19 0034 12/04/19 1245 12/04/19 1245 12/05/19 0725 12/05/19 0725 12/05/19 1548 12/05/19 1549 12/05/19 1714 12/06/19 0246 12/07/19 0338 12/07/19 2217 12/08/19 0545  NA 134*   < > 135   < > 132*   < > 135 133* 135 131* 134* 137 139  K 4.8   < > 4.9   < > 4.6   < > 4.1 4.3 4.3 4.3 4.1 3.8 3.7  CL 102  --  103  --  99  --   --   --   --  100 104 103 104  CO2 22  --  21*  --  19*  --   --   --   --  18* 17* 20* 21*  GLUCOSE 228*  --  207*  --  244*  --   --   --   --  354* 174* 217* 173*  BUN 20  --  30*  --  53*  --   --   --   --  70* 58* 58* 58*  CREATININE 1.53*  --  1.86*  --  3.10*  --   --   --   --  4.48* 3.62* 3.83* 4.09*  CALCIUM 9.5  --  9.7  --  9.8  --   --   --   --  8.6* 9.3 9.6 9.7  PHOS 2.9  --   --   --   --   --   --   --   --   --   --   --   --    < > = values in this interval not displayed.   CBC Recent Labs  Lab 12/04/19 0035 12/04/19 0035 12/05/19 0725 12/05/19 1548 12/05/19 1714 12/06/19 0246 12/07/19 0338 12/08/19 0545  WBC 22.9*   < > 24.0*  --   --  17.9* 9.6 12.2*  NEUTROABS 18.5*  --   --   --   --   --   --   --   HGB 15.3   < > 14.4   < > 14.6 11.5* 10.8* 12.6*  HCT 46.1   < > 43.1   < > 43.0 35.1* 31.9* 37.6*  MCV 83.4   < > 83.0  --   --  85.2 82.9 82.5  PLT 314   < > 316  --   --  346 220 304   < > = values in this  interval not displayed.    Medications:    . aspirin  81 mg Per Tube Daily  . Chlorhexidine Gluconate Cloth  6 each Topical Daily  . insulin aspart  0-9 Units Subcutaneous Q4H  . insulin detemir  10 Units Subcutaneous Daily  . metoCLOPramide (REGLAN) injection  5 mg Intravenous TID  . pantoprazole (PROTONIX) IV  40 mg Intravenous Q24H  . predniSONE  5 mg Per Tube Q breakfast  . rosuvastatin  40 mg Per Tube Daily  . sodium bicarbonate  1,300 mg Oral TID  . sodium chloride flush  3 mL Intravenous Q12H  . tacrolimus  3 mg Per Tube BID  . ticagrelor  90 mg Per Tube BID      Gean Quint, MD [facility] 12/08/2019, 10:03 AM

## 2019-12-08 NOTE — Evaluation (Addendum)
Physical Therapy Evaluation Patient Details Name: Luster Hechler. MRN: [id number] DOB: 1960/07/11 Today's Date: 12/08/2019   History of Present Illness  Pt is 59 yo male admitted with anterior STEMI.  Underwent cardiac cath on 10/17 and had DES to LAD complicated by post-cath cardiogenic shock and acute on chronic kidney injury. He was transferred to [facility] for intervention.  He went to the cath lab for Swan-Ganz and then TEE in the OR on 10/19. Initially planned for impella however procedure cancelled on finding an LV thrombus on TEE. 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 Intubated 10/19-10/20.  Clinical Impression  Pt admitted with above diagnosis. Pt did well with therapy today and was able to ambulate 210' in hall with EVA walker and min A.  Required cues for safety and posture.  RN was present and assisted with line/lead management.  Pt with VSS.  Pt reports 24 hr supervision at home but does have a flight of stairs to his bedroom.  Is normally independent, works, and drives a motorcycle.  Pt expected to progress well with therapy. At this time recommending RW and HHPT , but may progress past this level. Pt currently with functional limitations due to the deficits listed below (see PT Problem List). Pt will benefit from skilled PT to increase their independence and safety with mobility to allow discharge to the venue listed below.       Follow Up Recommendations Supervision/Assistance - 24 hour;Home health PT;Other (comment) (may progress to no PT needs)    Equipment Recommendations  Rolling walker with 5" wheels (may progress to no need for RW)    Recommendations for Other Services       Precautions / Restrictions Precautions Precautions: Fall Precaution Comments: Gordy Councilman with orders to ambulate in hall     Mobility  Bed Mobility Overal bed mobility: Needs Assistance Bed Mobility: Sit to Supine       Sit to supine: Min  assist;+2 for safety/equipment   General bed mobility comments: in chair at arrival    Transfers Overall transfer level: Needs assistance   Transfers: Sit to/from Stand Sit to Stand: Min assist;+2 safety/equipment         General transfer comment: Cues for safe hand placement and min A of 2 to boost  Ambulation/Gait Ambulation/Gait assistance: Min assist Gait Distance (Feet): 210 Feet Assistive device: 4-wheeled walker (EVA walker) Gait Pattern/deviations: Step-through pattern Gait velocity: normal - cues for controlled speed with turns and managing lines/leads   General Gait Details: Cues for posture and to look up; pt with normal gait speed and with turns required assist to control upright walker; mild unsteadiness requiring min A  Stairs            Wheelchair Mobility    Modified Rankin (Stroke Patients Only)       Balance Overall balance assessment: Needs assistance Sitting-balance support: No upper extremity supported Sitting balance-Leahy Scale: Good     Standing balance support: Bilateral upper extremity supported Standing balance-Leahy Scale: Poor Standing balance comment: Used EVA walker                             Pertinent Vitals/Pain Pain Assessment: No/denies pain    Home Living Family/patient expects to be discharged to:: Private residence Living Arrangements: Spouse/significant other Available Help at Discharge: Family;Available 24 hours/day Type of Home: House Home Access: Stairs to enter Entrance Stairs-Rails: None  Entrance Stairs-Number of Steps: 1 Home Layout: Two level;Bed/bath upstairs Home Equipment: Grab bars - tub/shower      Prior Function Level of Independence: Independent         Comments: Pt completely independent and works.  Likes to ride motorcycle.     Hand Dominance        Extremity/Trunk Assessment   Upper Extremity Assessment Upper Extremity Assessment: LUE deficits/detail;RUE  deficits/detail RUE Deficits / Details: Limited testing due to Gordy Councilman - pt reports no deficits in R UE LUE Deficits / Details: Hand/wrist/elbow: WFL ROM and strength.  L shoulder with limitations in strength and ROM - pt reports injured ~3 months ago when his dog jerked on the leash, reports he was going to see MD about it but had not yet.  Pt with very limited AROM of L shoulder and PROM to ~80 degrees.    Lower Extremity Assessment Lower Extremity Assessment: LLE deficits/detail;RLE deficits/detail RLE Deficits / Details: ROM WFL: MMT grossly 5/5 LLE Deficits / Details: ROM WFL: MMT grossly 5/5    Cervical / Trunk Assessment Cervical / Trunk Assessment: Normal  Communication   Communication: No difficulties  Cognition Arousal/Alertness: Awake/alert Behavior During Therapy: WFL for tasks assessed/performed Overall Cognitive Status: Within Functional Limits for tasks assessed                                        General Comments General comments (skin integrity, edema, etc.): Spoke with RN prior to therapy and pt had already ambulated 1 lap with them this morning.  RN assisted in line/lead management and securing Gordy Councilman Catheter.  Pt was on RA with HR 100-112 bpm throughout session.  BP and O2 sats stable.    Exercises     Assessment/Plan    PT Assessment Patient needs continued PT services  PT Problem List Decreased strength;Decreased mobility;Decreased safety awareness;Decreased range of motion;Decreased activity tolerance;Cardiopulmonary status limiting activity;Decreased balance;Decreased knowledge of use of DME       PT Treatment Interventions DME instruction;Therapeutic activities;Gait training;Therapeutic exercise;Patient/family education;Stair training;Functional mobility training;Balance training    PT Goals (Current goals can be found in the Care Plan section)  Acute Rehab PT Goals Patient Stated Goal: return home PT Goal Formulation: With  patient Time For Goal Achievement: 12/22/19 Potential to Achieve Goals: Good    Frequency Min 3X/week   Barriers to discharge Inaccessible home environment flight of steps to bedroom    Co-evaluation               AM-PAC PT "6 Clicks" Mobility  Outcome Measure Help needed turning from your back to your side while in a flat bed without using bedrails?: A Little Help needed moving from lying on your back to sitting on the side of a flat bed without using bedrails?: A Little Help needed moving to and from a bed to a chair (including a wheelchair)?: A Little Help needed standing up from a chair using your arms (e.g., wheelchair or bedside chair)?: A Little Help needed to walk in hospital room?: A Little Help needed climbing 3-5 steps with a railing? : A Lot 6 Click Score: 17    End of Session Equipment Utilized During Treatment: Gait belt Activity Tolerance: Patient tolerated treatment well Patient left: in bed;with call bell/phone within reach;with SCD's reapplied Nurse Communication: Mobility status PT Visit Diagnosis: Unsteadiness on feet (R26.81);Muscle weakness (generalized) (M62.81)  Time: 1207-1228 PT Time Calculation (min) (ACUTE ONLY): 21 min   Charges:   PT Evaluation $PT Eval Moderate Complexity: 1 Melina Schools, PT Acute Rehab Services Pager [phone] [facility] 12/08/2019, 1:19 PM

## 2019-12-08 NOTE — Progress Notes (Addendum)
Patient ID: Casey Mages., male   DOB: 10-Apr-1960, 59 y.o.   MRN: [id number]     Advanced Heart Failure Rounding Note  PCP-Cardiologist: No primary care provider on file.   Subjective:    Patient taken on OR 10/19 for Impella 5.5, unable to place due to sludge/early thrombus present at LV apex.  Subsequently returned [place], developed AF/RVR and started on amiodarone.    Remains on milrinone 0.25 mcg + amio drip. CO-OX 62%.   Diuresing with IV lasix. Negative 5.2 liters. Weight down another 13 pounds.   Yesterday started on reglan and received dose of sorbitol.    Says he has passed some gas. Denies SOB. Denies chest pain.    PAP: (16-39)/(6-22) 29/16 CVP:  [0 mmHg-29 mmHg] 8 mmHg PCWP:  [13 mmHg-14 mmHg] 14 mmHg CO:  [4.5 L/min-6.3 L/min] 4.5 L/min CI:  [2.1 L/min/m2-2.9 L/min/m2] 2.1 L/min/m2  RHC Procedural Findings (on milrinone 0.25 mcg/kg/min): Hemodynamics (mmHg) RA mean 4 RV 20/5 PA 22/4, mean 14 PCWP mean 5 Oxygen saturations: PA 65% AO 98% Cardiac Output (Fick) 4.5  Cardiac Index (Fick) 2.06 Cardiac Output (Thermo) 4.5 Cardiac Index (Thermo) 2.06  Objective:   Weight Range: 89.8 kg Body mass index is 26.12 kg/m.   Vital Signs:   Temp:  [98.2 F (36.8 C)-99.9 F (37.7 C)] 99 F (37.2 C) (10/22 0700) Pulse Rate:  [71-114] 91 (10/22 0700) Resp:  [16-42] 16 (10/22 0700) BP: (109-183)/(55-91) 133/81 (10/22 0700) SpO2:  [92 %-100 %] 99 % (10/22 0700) Arterial Line BP: (117-175)/(59-83) 119/83 (10/21 2100) Weight:  [89.8 kg] 89.8 kg (10/22 0500) Last BM Date:  (PTA)  Weight change: Filed Weights   12/06/19 0430 12/07/19 0200 12/08/19 0500  Weight: 96.1 kg 96.1 kg 89.8 kg    Intake/Output:   Intake/Output Summary (Last 24 hours) at 12/08/2019 0832 Last data filed at 12/08/2019 0700 Gross per 24 hour  Intake 1607.46 ml  Output 6505 ml  Net -4897.54 ml      Physical Exam   CVP 8 General: Sitting in the chair.  HEENT: NG tube  Neck:  supple. JVP 7-8. Carotids 2+ bilat; no bruits. No lymphadenopathy or thryomegaly appreciated. RIJ swan.  Cor: PMI nondisplaced. Regular rate & rhythm. No rubs, gallops or murmurs. Lungs: clear Abdomen:  nontender, distended. No hepatosplenomegaly. No bruits or masses. Sluggish bowel sounds.  Extremities: no cyanosis, clubbing, rash, edema Neuro: alert & orientedx3, cranial nerves grossly intact. moves all 4 extremities w/o difficulty. Affect flat   Telemetry   SR-ST 90-100s   Labs    CBC Recent Labs    12/07/19 0338 12/08/19 0545  WBC 9.6 12.2*  HGB 10.8* 12.6*  HCT 31.9* 37.6*  MCV 82.9 82.5  PLT 220 665   Basic Metabolic Panel Recent Labs    12/07/19 2217 12/08/19 0545  NA 137 139  K 3.8 3.7  CL 103 104  CO2 20* 21*  GLUCOSE 217* 173*  BUN 58* 58*  CREATININE 3.83* 4.09*  CALCIUM 9.6 9.7  MG 2.8*  --    Liver Function Tests Recent Labs    12/07/19 0338 12/08/19 0545  AST 33 24  ALT 29 26  ALKPHOS 75 106  BILITOT 0.6 0.9  PROT 6.1* 6.9  ALBUMIN 2.4* 2.4*   No results for input(s): LIPASE, AMYLASE in the last 72 hours. Cardiac Enzymes No results for input(s): CKTOTAL, CKMB, CKMBINDEX, TROPONINI in the last 72 hours.  BNP: BNP (last 3 results) No results for input(s): BNP  in the last 8760 hours.  ProBNP (last 3 results) No results for input(s): PROBNP in the last 8760 hours.   D-Dimer No results for input(s): DDIMER in the last 72 hours. Hemoglobin A1C No results for input(s): HGBA1C in the last 72 hours. Fasting Lipid Panel No results for input(s): CHOL, HDL, LDLCALC, TRIG, CHOLHDL, LDLDIRECT in the last 72 hours. Thyroid Function Tests No results for input(s): TSH, T4TOTAL, T3FREE, THYROIDAB in the last 72 hours.  Invalid input(s): FREET3  Other results:   Imaging    No results found.   Medications:     Scheduled Medications: . aspirin  81 mg Per Tube Daily  . Chlorhexidine Gluconate Cloth  6 each Topical Daily  . furosemide   40 mg Intravenous BID  . insulin aspart  0-9 Units Subcutaneous Q4H  . insulin detemir  10 Units Subcutaneous Daily  . metoCLOPramide (REGLAN) injection  5 mg Intravenous TID  . pantoprazole (PROTONIX) IV  40 mg Intravenous Q24H  . predniSONE  5 mg Per Tube Q breakfast  . rosuvastatin  40 mg Per Tube Daily  . sodium bicarbonate  1,300 mg Oral TID  . sodium chloride flush  3 mL Intravenous Q12H  . tacrolimus  3 mg Per Tube BID  . ticagrelor  90 mg Per Tube BID    Infusions: . sodium chloride    . sodium chloride    . sodium chloride    . sodium chloride    . albumin human    . amiodarone 30 mg/hr (12/08/19 0700)  . heparin 1,700 Units/hr (12/08/19 0700)  . lactated ringers    . milrinone 0.25 mcg/kg/min (12/08/19 0700)  . piperacillin-tazobactam (ZOSYN)  IV 12.5 mL/hr at 12/08/19 0700    PRN Medications: sodium chloride, acetaminophen  Assessment/Plan   1. Shock: Ischemic cardiomyopathy with EF 20-25% post-late presentation anterior MI.  concern for cardiogenic shock but possible component of septic shock as well (gut source).  Excellent cardiac output by thermodilution and co-ox.  He is now off pressors, remains on milrinone 0.25. Hemodynamics ok today.  Brisk diuresis noted. CVP 9 today. Give one more dose of IV lasix then stop. Creatinine trending up.  - Continue milrinone 0.25 for now. CO-OX 62%.  - Not candidate for Impella at this point with developing LV thrombus on TEE. - If stabilizes and creatinine comes down, consider PCI to LCx system.  2. CAD: Delayed presentation anterior MI (CP began 10/15, PCI on 10/17).  He had DES to LAD, has residual severe up to 99% mid to distal RCA stenosis that is not revascularizable.  He has 95% mid-distal LCx stenosis and 80% OM3 stenosis that could be intervened upon.   - He is on Brilinta 90 bid + ASA 81 + statin post-MI.  - As above, will ideally have eventual PCI to LCx system.  - No chest pain.  3. AKI on CKD stage 3: Patient has  history of renal transplant at [facility] in 2011.  Baseline creatinine 1.4, up to 4.48 this admission.  Now trending down, 4.1 today.  Suspect combination of contrast-induced nephropathy and cardiorenal syndrome in setting of cardiogenic shock.   - Tacrolimus dose has been decreased, level sent today from  [facility].  Called for results. Not available.  Pharmacy to call and try to locate result.   - Nephrology now following.  4. Elevated LFTs: Mild elevation, suspect shock liver with hypotension/cardiogenic shock. LFts normalized. 5. DM2: SSI.  6. Abdominal pain: Abdominal CT with ileus.  -  Continue IV Reglan 3 doses today. Passing gas today.  Continue to mobilize.  -Will cover empirically with antibiotics for now, Zosyn.  7. Sludge LV apex/early thrombus: He is on heparin gtt.  8. ID: Tm 99.9 and WBCs down to 9.6.  PCT 61-->33.  ?Component of septic shock from gut source given ileus/symptoms, covering for now with Zosyn.  - Continue Zosyn.  9. Atrial fibrillation: Paroxysmal.  Run overnight, now on amiodarone gtt in NSR.   Length of Stay: 3  Amy Clegg, NP  12/08/2019, 8:32 AM  Advanced Heart Failure Team Pager [phone] (M-F; 7a - 4p)  Please contact [facility] for night-coverage after hours (4p -7a ) and weekends on [email or web address]  Patient seen with NP, agree with the above note.   Still with distended abdomen and hypoactive bowel sounds.  He says that he has passed flatus.  Getting Reglan.   CVP 9 today with co-ox 62%, CI 2.1.  MAP stable.  On milrinone 0.25. Creatinine trended back up to 4. Excellent UOP.   Atrial fibrillation with RVR overnight, now on amiodarone gtt in NSR.  He has been on heparin gtt.   General: NAD Neck: JVP 8-9 cm, no thyromegaly or thyroid nodule.  Lungs: Clear to auscultation bilaterally with normal respiratory effort. CV: Nondisplaced PMI.  Heart regular S1/S2, no S3/S4, no murmur.  No peripheral edema.  Abdomen: Soft, nontender, no hepatosplenomegaly, moderate  distention.  Skin: Intact without lesions or rashes.  Neurologic: Alert and oriented x 3.  Psych: Normal affect. Extremities: No clubbing or cyanosis.  HEENT: Normal.   Continue milrinone today.  With uptrending creatinine, stop IV Lasix (CVP 9, got 1 dose of IV Lasix this morning).  Still waiting for prior tacrolimus level, renal following.   He remains on heparin gtt for early LV thrombus and atrial fibrillation.   Now in NSR on IV amiodarone.  Continue while on milrinone.   Remains on ticagrelor + ASA + statin for LAD PCI.  Eventually will need to transition to Plavix (will be on warfarin long-term).  Ideally would have PCI to LCx system but cannot right now with AKI.   Still with ileus.  Mobilize today, continue IV Reglan and sorbitol.   CRITICAL CARE Performed by: Loralie Champagne  Total critical care time: 35 minutes  Critical care time was exclusive of separately billable procedures and treating other patients.  Critical care was necessary to treat or prevent imminent or life-threatening deterioration.  Critical care was time spent personally by me on the following activities: development of treatment plan with patient and/or surrogate as well as nursing, discussions with consultants, evaluation of patient's response to treatment, examination of patient, obtaining history from patient or surrogate, ordering and performing treatments and interventions, ordering and review of laboratory studies, ordering and review of radiographic studies, pulse oximetry and re-evaluation of patient's condition.  Loralie Champagne 12/08/2019 9:38 AM

## 2019-12-09 LAB — COMPREHENSIVE METABOLIC PANEL
ALT: 22 U/L (ref 0–44)
AST: 22 U/L (ref 15–41)
Albumin: 2.3 g/dL — ABNORMAL LOW (ref 3.5–5.0)
Alkaline Phosphatase: 116 U/L (ref 38–126)
Anion gap: 14 (ref 5–15)
BUN: 60 mg/dL — ABNORMAL HIGH (ref 6–20)
CO2: 22 mmol/L (ref 22–32)
Calcium: 9.2 mg/dL (ref 8.9–10.3)
Chloride: 106 mmol/L (ref 98–111)
Creatinine, Ser: 4.65 mg/dL — ABNORMAL HIGH (ref 0.61–1.24)
GFR, Estimated: 14 mL/min — ABNORMAL LOW (ref >60)
Glucose, Bld: 161 mg/dL — ABNORMAL HIGH (ref 70–99)
Potassium: 3.3 mmol/L — ABNORMAL LOW (ref 3.5–5.1)
Sodium: 142 mmol/L (ref 135–145)
Total Bilirubin: 1 mg/dL (ref 0.3–1.2)
Total Protein: 6.4 g/dL — ABNORMAL LOW (ref 6.5–8.1)

## 2019-12-09 LAB — TYPE AND SCREEN
ABO/RH(D): A POS
Antibody Screen: NEGATIVE
Unit division: 0
Unit division: 0

## 2019-12-09 LAB — BPAM RBC
Blood Product Expiration Date: 202111052359
Blood Product Expiration Date: 202111052359
ISSUE DATE / TIME: 202110191658
ISSUE DATE / TIME: 202110191658
Unit Type and Rh: 6200
Unit Type and Rh: 6200

## 2019-12-09 LAB — COOXEMETRY PANEL
Carboxyhemoglobin: 0.8 % (ref 0.5–1.5)
Methemoglobin: 0.8 % (ref 0.0–1.5)
O2 Saturation: 63.1 %
Total hemoglobin: 12.6 g/dL (ref 12.0–16.0)

## 2019-12-09 LAB — URINALYSIS, ROUTINE W REFLEX MICROSCOPIC
Bilirubin Urine: NEGATIVE
Glucose, UA: NEGATIVE mg/dL
Ketones, ur: NEGATIVE mg/dL
Leukocytes,Ua: NEGATIVE
Nitrite: NEGATIVE
Protein, ur: 100 mg/dL — AB
Specific Gravity, Urine: 1.017 (ref 1.005–1.030)
pH: 5 (ref 5.0–8.0)

## 2019-12-09 LAB — CBC
HCT: 36.9 % — ABNORMAL LOW (ref 39.0–52.0)
Hemoglobin: 12.3 g/dL — ABNORMAL LOW (ref 13.0–17.0)
MCH: 27.5 pg (ref 26.0–34.0)
MCHC: 33.3 g/dL (ref 30.0–36.0)
MCV: 82.4 fL (ref 80.0–100.0)
Platelets: 309 10*3/uL (ref 150–400)
RBC: 4.48 MIL/uL (ref 4.22–5.81)
RDW: 14.2 % (ref 11.5–15.5)
WBC: 12.9 10*3/uL — ABNORMAL HIGH (ref 4.0–10.5)
nRBC: 0 % (ref 0.0–0.2)

## 2019-12-09 LAB — GLUCOSE, CAPILLARY
Glucose-Capillary: 157 mg/dL — ABNORMAL HIGH (ref 70–99)
Glucose-Capillary: 164 mg/dL — ABNORMAL HIGH (ref 70–99)
Glucose-Capillary: 165 mg/dL — ABNORMAL HIGH (ref 70–99)
Glucose-Capillary: 176 mg/dL — ABNORMAL HIGH (ref 70–99)
Glucose-Capillary: 180 mg/dL — ABNORMAL HIGH (ref 70–99)
Glucose-Capillary: 210 mg/dL — ABNORMAL HIGH (ref 70–99)
Glucose-Capillary: 297 mg/dL — ABNORMAL HIGH (ref 70–99)

## 2019-12-09 LAB — HEPARIN LEVEL (UNFRACTIONATED): Heparin Unfractionated: 0.35 IU/mL (ref 0.30–0.70)

## 2019-12-09 MED ORDER — PIPERACILLIN-TAZOBACTAM IN DEX 2-0.25 GM/50ML IV SOLN
2.2500 g | Freq: Three times a day (TID) | INTRAVENOUS | Status: AC
  Administered 2019-12-09 – 2019-12-11 (×8): 2.25 g via INTRAVENOUS
  Filled 2019-12-09 (×9): qty 50

## 2019-12-09 MED ORDER — POTASSIUM CHLORIDE CRYS ER 20 MEQ PO TBCR
20.0000 meq | EXTENDED_RELEASE_TABLET | Freq: Once | ORAL | Status: AC
  Administered 2019-12-09: 20 meq via ORAL
  Filled 2019-12-09: qty 1

## 2019-12-09 MED ORDER — ROSUVASTATIN CALCIUM 20 MG PO TABS
40.0000 mg | ORAL_TABLET | Freq: Every day | ORAL | Status: DC
  Administered 2019-12-09 – 2019-12-15 (×7): 40 mg via ORAL
  Filled 2019-12-09 (×7): qty 2

## 2019-12-09 MED ORDER — SODIUM CHLORIDE 0.9 % IV SOLN: INTRAVENOUS | Status: AC

## 2019-12-09 MED ORDER — PREDNISONE 5 MG PO TABS
5.0000 mg | ORAL_TABLET | Freq: Every day | ORAL | Status: DC
  Administered 2019-12-10 – 2019-12-14 (×5): 5 mg via ORAL
  Filled 2019-12-09 (×5): qty 1

## 2019-12-09 MED ORDER — ASPIRIN 81 MG PO CHEW
81.0000 mg | CHEWABLE_TABLET | Freq: Every day | ORAL | Status: DC
  Administered 2019-12-09 – 2019-12-15 (×7): 81 mg via ORAL
  Filled 2019-12-09 (×7): qty 1

## 2019-12-09 MED ORDER — TICAGRELOR 90 MG PO TABS
90.0000 mg | ORAL_TABLET | Freq: Two times a day (BID) | ORAL | Status: DC
  Administered 2019-12-09 – 2019-12-12 (×7): 90 mg via ORAL
  Filled 2019-12-09 (×7): qty 1

## 2019-12-09 NOTE — Progress Notes (Signed)
[facility] Progress Note    Assessment/ Plan:   1. Acute kidney Injury (oliguric) secondary to cardiogenic shock and diminished perfusion from hypotension (ATN picture) with possible contrast induced injury -at this junction, there is no indication for renal replacement therapy.  Renal function is worse than yesterday, likely related to diuresis (5.1L urine output), suspecting this is a pre-renal injury in the setting of no PO intake and auto-diuresis.  Would favor gentle IV hydration especially given that he is not significantly hypervolemic on exam -maintain MAP >65 to ensure adequate perfusion -Avoid nephrotoxic medications including NSAIDs and iodinated intravenous contrast exposure unless the latter is absolutely indicated.  Preferred narcotic agents for pain control are hydromorphone, fentanyl, and methadone. Morphine should not be used. Avoid Baclofen and avoid oral sodium phosphate and magnesium citrate based laxatives / bowel preps. Continue strict Input and Output monitoring. 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 Marval Regal), underlying disease=HTN and DM. Continue with tacrolimus and home prednisone dose. 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 If needed can use stress dose steroids if persistently hypotensive. Baseline creatinine ~1.3. Tacrolimus trough pending, sent 10/19 (goal tacrolimus trough 5-7). Transplant ultrasound reviewed. Agree with lower tac dosing as compared to home (drug interaction with amio). If renal function is worsening would highly consider transfer to his transplant center to expedite work up for possible rejection 3. AGMA, likely related to AKI, if worsening repeat lactate levels. sodium bicarb 1300mg  TID 4. Shock, cardiogenic +/- septic shock: on zosyn, improved now off all pressors, only on milrinone 5.  Ischemic cardiomyopathy, EF 20-25%. Diurese as needed. Not a candidate for impella given LV thrombus. Off lasix 6. Anterior STEMI, s/p LHC 10/17 7. LV thrombus: on hep gtt 8. Abdominal pain: ileus?  Noncon CT revealed ileus and nonobstructing 3 mm calculus in the lower pole of the right iliac fossa renal transplant. Mgmt per primary 9. Leukocytosis: on zosyn 10. Ileus: seems to be improving, neostigmine   Subjective:   Feels well, no complaints. Had two bowel movements, eager to eat. Denies fevers, chest pain, SOB, swelling, orthopnea.   Objective:   BP 125/74   Pulse (!) 101   Temp 98.8 F (37.1 C)   Resp (!) 31   Ht 6\' 1"  (1.854 m)   Wt 88.1 kg   SpO2 96%   BMI 25.62 kg/m   Intake/Output Summary (Last 24 hours) at 12/09/2019 1230 Last data filed at 12/09/2019 1200 Gross per 24 hour  Intake 1605.7 ml  Output 3170 ml  Net -1564.3 ml   Weight change: -1.7 kg  Physical Exam: Gen:nad, drowsy HEENT: dry mucosal membranes GEX:B2W4, + systolic murmur, no r/g, rrr Resp:cta bl, bl chest expansion Abd: distended, nontender, soft Ext:no edema Neuro: drowsy but awake, alert, following commands, answering questions  Imaging: No results found.  Labs: BMET Recent Labs  Lab 12/04/19 0034 12/04/19 0034 12/04/19 1245 12/04/19 1245 12/05/19 0725 12/05/19 1548 12/05/19 1549 12/05/19 1714 12/06/19 0246 12/07/19 0338 12/07/19 2217 12/08/19 0545 12/09/19 0407  NA 134*   < > 135   < > 132*   < > 133* 135 131* 134* 137 139 142  K 4.8   < > 4.9   < > 4.6   < > 4.3 4.3 4.3 4.1 3.8 3.7 3.3*  CL 102   < > 103  --  99  --   --   --  100 104 103 104 106  CO2 22   < > 21*  --  19*  --   --   --  18* 17* 20* 21* 22  GLUCOSE 228*   < > 207*  --  244*  --   --   --  354* 174* 217* 173* 161*  BUN 20   < > 30*  --  53*  --   --   --  70* 58* 58* 58* 60*  CREATININE 1.53*   < > 1.86*  --  3.10*  --   --   --  4.48* 3.62* 3.83* 4.09* 4.65*  CALCIUM 9.5   < > 9.7  --  9.8  --   --   --   8.6* 9.3 9.6 9.7 9.2  PHOS 2.9  --   --   --   --   --   --   --   --   --   --   --   --    < > = values in this interval not displayed.   CBC Recent Labs  Lab 12/04/19 0035 12/05/19 0725 12/06/19 0246 12/07/19 0338 12/08/19 0545 12/09/19 0921  WBC 22.9*   < > 17.9* 9.6 12.2* 12.9*  NEUTROABS 18.5*  --   --   --   --   --   HGB 15.3   < > 11.5* 10.8* 12.6* 12.3*  HCT 46.1   < > 35.1* 31.9* 37.6* 36.9*  MCV 83.4   < > 85.2 82.9 82.5 82.4  PLT 314   < > 346 220 304 309   < > = values in this interval not displayed.    Medications:    . aspirin  81 mg Oral Daily  . Chlorhexidine Gluconate Cloth  6 each Topical Daily  . insulin aspart  0-9 Units Subcutaneous Q4H  . insulin detemir  10 Units Subcutaneous Daily  . metoCLOPramide (REGLAN) injection  5 mg Intravenous TID  . neostigmine methylsulfate  0.25 mg Subcutaneous Q6H  . pantoprazole (PROTONIX) IV  40 mg Intravenous Q24H  . potassium chloride  20 mEq Oral Once  . [START ON 12/10/2019] predniSONE  5 mg Oral Q breakfast  . rosuvastatin  40 mg Oral Daily  . sodium bicarbonate  1,300 mg Oral TID  . sodium chloride flush  3 mL Intravenous Q12H  . tacrolimus  3 mg Per Tube BID  . ticagrelor  90 mg Oral BID      Gean Quint, MD [facility] 12/09/2019, 12:30 PM

## 2019-12-09 NOTE — Progress Notes (Addendum)
[place] for heparin + Zosyn Indication: apical thrombus  No Known Allergies  Patient Measurements: Height: 6\' 1"  (185.4 cm) Weight: 88.1 kg (194 lb 3.6 oz) IBW/kg (Calculated) : 79.9 Heparin Dosing Weight: 92 kg  Vital Signs: Temp: 98.8 F (37.1 C) (10/23 0829) BP: 94/75 (10/23 0800) Pulse Rate: 99 (10/23 0829)  Labs: Recent Labs    12/07/19 0338 12/07/19 0338 12/07/19 0359 12/07/19 2217 12/08/19 0545 12/09/19 0407  HGB 10.8*  --   --   --  12.6*  --   HCT 31.9*  --   --   --  37.6*  --   PLT 220  --   --   --  304  --   HEPARINUNFRC  --   --  0.42  --  0.36 0.35  CREATININE 3.62*   < >  --  3.83* 4.09* 4.65*   < > = values in this interval not displayed.    Estimated Creatinine Clearance: 19.3 mL/min (A) (by C-G formula based on SCr of 4.65 mg/dL (H)).   Medical History: Past Medical History:  Diagnosis Date  . Chronic kidney disease 04/2009   Kidney Transplant  . Diabetes mellitus   . GERD (gastroesophageal reflux disease)    as needed reflux  . Hypertension   . 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     Assessment: 59 year old male presented with STEMI s/p PCI to mid LAD. Pt noted to have apical smoke on ECHO concerning for early thrombus, confirmed on TEE. Pharmacy asked to continue heparin.   Heparin remains therapeutic this morning. Cr up slightly, CrCl now just <20 ml/min - will adjust Zosyn.  Goal of Therapy:  Heparin level 0.3-0.7 units/ml Monitor platelets by anticoagulation protocol: Yes   Plan:  -Continue heparin drip rate 1700 units/h -Daily heparin level and CBC -Reduce Zosyn to 2.25g IV q8h   Arrie Senate, PharmD, BCPS Clinical Pharmacist [phone] Please check AMION for all [place] numbers 12/09/2019

## 2019-12-09 NOTE — Progress Notes (Signed)
Patient ID: Jenelle Mages., male   DOB: [birth year], 59 y.o.   MRN: [id number]      Advanced Heart Failure Rounding Note  PCP-Cardiologist: No primary care provider on file.   Subjective:    Patient taken on OR 10/19 for Impella 5.5, unable to place due to sludge/early thrombus present at LV apex.  Subsequently returned [place], developed AF/RVR and started on amiodarone, converted to NSR.   Remains on milrinone 0.25 mcg + amio drip.   I/Os negative yesterday, creatinine up to 4.6.  Not on diuretics.   Started on neostigmine with ileus, had BM and decreased abdominal pain.    PCT 33, afebrile.  He is on Zosyn.   [place]: CVP 4 PA 20/11 PCWP 13 CI 2.5 Co-ox 63%  RHC Procedural Findings (on milrinone 0.25 mcg/kg/min): Hemodynamics (mmHg) RA mean 4 RV 20/5 PA 22/4, mean 14 PCWP mean 5 Oxygen saturations: PA 65% AO 98% Cardiac Output (Fick) 4.5  Cardiac Index (Fick) 2.06 Cardiac Output (Thermo) 4.5 Cardiac Index (Thermo) 2.06  Objective:   Weight Range: 88.1 kg Body mass index is 25.62 kg/m.   Vital Signs:   Temp:  [98.4 F (36.9 C)-99.7 F (37.6 C)] 98.8 F (37.1 C) (10/23 0829) Pulse Rate:  [54-113] 99 (10/23 0829) Resp:  [13-38] 25 (10/23 0829) BP: (94-135)/(64-98) 94/75 (10/23 0800) SpO2:  [83 %-97 %] 93 % (10/23 0829) Weight:  [88.1 kg] 88.1 kg (10/23 0630) Last BM Date: 12/09/19  Weight change: Filed Weights   12/07/19 0200 12/08/19 0500 12/09/19 0630  Weight: 96.1 kg 89.8 kg 88.1 kg    Intake/Output:   Intake/Output Summary (Last 24 hours) at 12/09/2019 0905 Last data filed at 12/09/2019 0800 Gross per 24 hour  Intake 1080.69 ml  Output 3505 ml  Net -2424.31 ml      Physical Exam   CVP 4 General: NAD Neck: No JVD, no thyromegaly or thyroid nodule.  Lungs: Clear to auscultation bilaterally with normal respiratory effort. CV: Nondisplaced PMI.  Heart regular S1/S2, no S3/S4, no murmur.  No peripheral edema.   Abdomen: Soft, nontender, no  hepatosplenomegaly, moderate distention, hypoactive BS.  Skin: Intact without lesions or rashes.  Neurologic: Alert and oriented x 3.  Psych: Normal affect. Extremities: No clubbing or cyanosis.  HEENT: Normal.    Telemetry   NSR 90s (personally reviewed)  Labs    CBC Recent Labs    12/07/19 0338 12/08/19 0545  WBC 9.6 12.2*  HGB 10.8* 12.6*  HCT 31.9* 37.6*  MCV 82.9 82.5  PLT 220 528   Basic Metabolic Panel Recent Labs    12/07/19 2217 12/07/19 2217 12/08/19 0545 12/09/19 0407  NA 137   < > 139 142  K 3.8   < > 3.7 3.3*  CL 103   < > 104 106  CO2 20*   < > 21* 22  GLUCOSE 217*   < > 173* 161*  BUN 58*   < > 58* 60*  CREATININE 3.83*   < > 4.09* 4.65*  CALCIUM 9.6   < > 9.7 9.2  MG 2.8*  --   --   --    < > = values in this interval not displayed.   Liver Function Tests Recent Labs    12/08/19 0545 12/09/19 0407  AST 24 22  ALT 26 22  ALKPHOS 106 116  BILITOT 0.9 1.0  PROT 6.9 6.4*  ALBUMIN 2.4* 2.3*   No results for input(s): LIPASE, AMYLASE in the last  72 hours. Cardiac Enzymes No results for input(s): CKTOTAL, CKMB, CKMBINDEX, TROPONINI in the last 72 hours.  BNP: BNP (last 3 results) No results for input(s): BNP in the last 8760 hours.  ProBNP (last 3 results) No results for input(s): PROBNP in the last 8760 hours.   D-Dimer No results for input(s): DDIMER in the last 72 hours. Hemoglobin A1C No results for input(s): HGBA1C in the last 72 hours. Fasting Lipid Panel No results for input(s): CHOL, HDL, LDLCALC, TRIG, CHOLHDL, LDLDIRECT in the last 72 hours. Thyroid Function Tests No results for input(s): TSH, T4TOTAL, T3FREE, THYROIDAB in the last 72 hours.  Invalid input(s): FREET3  Other results:   Imaging    No results found.   Medications:     Scheduled Medications: . aspirin  81 mg Per Tube Daily  . Chlorhexidine Gluconate Cloth  6 each Topical Daily  . insulin aspart  0-9 Units Subcutaneous Q4H  . insulin detemir   10 Units Subcutaneous Daily  . metoCLOPramide (REGLAN) injection  5 mg Intravenous TID  . neostigmine methylsulfate  0.25 mg Subcutaneous Q6H  . pantoprazole (PROTONIX) IV  40 mg Intravenous Q24H  . predniSONE  5 mg Per Tube Q breakfast  . rosuvastatin  40 mg Per Tube Daily  . sodium bicarbonate  1,300 mg Oral TID  . sodium chloride flush  3 mL Intravenous Q12H  . tacrolimus  3 mg Per Tube BID  . ticagrelor  90 mg Per Tube BID    Infusions: . sodium chloride    . sodium chloride    . sodium chloride    . sodium chloride    . albumin human    . amiodarone 30 mg/hr (12/09/19 0800)  . heparin 1,700 Units/hr (12/09/19 0800)  . lactated ringers    . milrinone 0.25 mcg/kg/min (12/09/19 0800)  . piperacillin-tazobactam (ZOSYN)  IV 12.5 mL/hr at 12/09/19 0800    PRN Medications: sodium chloride, acetaminophen, ALPRAZolam, phenol  Assessment/Plan   1. Shock: Ischemic cardiomyopathy with EF 20-25% post-late presentation anterior MI.  concern for cardiogenic shock but possible component of septic shock as well (gut source).  Excellent cardiac output by thermodilution and co-ox.  He is now off pressors, remains on milrinone 0.25. CVP down to 4, creatinine higher.  - Continue milrinone 0.25 for now.   - Not candidate for Impella at this point with developing LV thrombus on TEE. - No diuretic, will give NS 100 cc/hr x 5 hrs.  - If stabilizes and creatinine comes down, consider PCI to LCx system.  2. CAD: Delayed presentation anterior MI (CP began 10/15, PCI on 10/17).  He had DES to LAD, has residual severe up to 99% mid to distal RCA stenosis that is not revascularizable.  He has 95% mid-distal LCx stenosis and 80% OM3 stenosis that could be intervened upon.  He is on Brilinta 90 bid + ASA 81 + statin post-MI. No chest pain.  - Will eventually need transition to Eliquis for atrial fibrillation, will also transition from Brilinta to Plavix at that time.  - As above, will ideally have eventual  PCI to LCx system.  3. AKI on CKD stage 3: Patient has history of renal transplant at [facility] in 2011.  Baseline creatinine 1.4, now 4.6.  Suspect combination of contrast-induced nephropathy and cardiorenal syndrome in setting of cardiogenic shock.  CVP 4.  - Tacrolimus dose has been decreased, level pending (hopefully will be available this weekend).  - As above, no diuretics and will give  gentle IVF.  - Nephrology now following.  4. Elevated LFTs: Mild elevation, suspect shock liver with hypotension/cardiogenic shock. LFTs normalized. 5. DM2: SSI.  6. Ileus: Abdominal CT confirmed no obstruction.  Started on Reglan and neostigmine, had BM overnight.  - Continue to mobilize. - Continue neostigmine today then stop.  - Will start clear liquids and advance diet as tolerated.   7. Sludge LV apex/early thrombus: He is on heparin gtt. Eventually to warfarin.   8. ID: Afebrile.  PCT 61-->33.  ?Component of septic shock from gut source given ileus/symptoms, covering for now with Zosyn.  - Continue Zosyn.  9. Atrial fibrillation: Paroxysmal.  Now back in NSR on amiodarone gtt.  - Continue amiodarone gtt while on milrinone.  - Continue heparin gtt, eventually to warfarin given LV thrombus.     CRITICAL CARE Performed by: Loralie Champagne  Total critical care time: 35 minutes  Critical care time was exclusive of separately billable procedures and treating other patients.  Critical care was necessary to treat or prevent imminent or life-threatening deterioration.  Critical care was time spent personally by me on the following activities: development of treatment plan with patient and/or surrogate as well as nursing, discussions with consultants, evaluation of patient's response to treatment, examination of patient, obtaining history from patient or surrogate, ordering and performing treatments and interventions, ordering and review of laboratory studies, ordering and review of radiographic studies, pulse  oximetry and re-evaluation of patient's condition.  Loralie Champagne 12/09/2019 9:05 AM

## 2019-12-10 ENCOUNTER — Inpatient Hospital Stay (HOSPITAL_COMMUNITY): Payer: BC Managed Care – PPO

## 2019-12-10 LAB — CULTURE, BLOOD (ROUTINE X 2)
Culture: NO GROWTH
Culture: NO GROWTH
Special Requests: ADEQUATE
Special Requests: ADEQUATE

## 2019-12-10 LAB — COMPREHENSIVE METABOLIC PANEL
ALT: 22 U/L (ref 0–44)
AST: 21 U/L (ref 15–41)
Albumin: 2.3 g/dL — ABNORMAL LOW (ref 3.5–5.0)
Alkaline Phosphatase: 151 U/L — ABNORMAL HIGH (ref 38–126)
Anion gap: 13 (ref 5–15)
BUN: 55 mg/dL — ABNORMAL HIGH (ref 6–20)
CO2: 21 mmol/L — ABNORMAL LOW (ref 22–32)
Calcium: 9.6 mg/dL (ref 8.9–10.3)
Chloride: 107 mmol/L (ref 98–111)
Creatinine, Ser: 5.18 mg/dL — ABNORMAL HIGH (ref 0.61–1.24)
GFR, Estimated: 12 mL/min — ABNORMAL LOW (ref >60)
Glucose, Bld: 164 mg/dL — ABNORMAL HIGH (ref 70–99)
Potassium: 3.6 mmol/L (ref 3.5–5.1)
Sodium: 141 mmol/L (ref 135–145)
Total Bilirubin: 1.3 mg/dL — ABNORMAL HIGH (ref 0.3–1.2)
Total Protein: 6.7 g/dL (ref 6.5–8.1)

## 2019-12-10 LAB — HEPARIN LEVEL (UNFRACTIONATED): Heparin Unfractionated: 0.41 IU/mL (ref 0.30–0.70)

## 2019-12-10 LAB — GLUCOSE, CAPILLARY
Glucose-Capillary: 145 mg/dL — ABNORMAL HIGH (ref 70–99)
Glucose-Capillary: 183 mg/dL — ABNORMAL HIGH (ref 70–99)
Glucose-Capillary: 219 mg/dL — ABNORMAL HIGH (ref 70–99)
Glucose-Capillary: 231 mg/dL — ABNORMAL HIGH (ref 70–99)
Glucose-Capillary: 237 mg/dL — ABNORMAL HIGH (ref 70–99)
Glucose-Capillary: 240 mg/dL — ABNORMAL HIGH (ref 70–99)

## 2019-12-10 LAB — CBC
HCT: 35.9 % — ABNORMAL LOW (ref 39.0–52.0)
Hemoglobin: 11.8 g/dL — ABNORMAL LOW (ref 13.0–17.0)
MCH: 27.6 pg (ref 26.0–34.0)
MCHC: 32.9 g/dL (ref 30.0–36.0)
MCV: 84.1 fL (ref 80.0–100.0)
Platelets: 309 10*3/uL (ref 150–400)
RBC: 4.27 MIL/uL (ref 4.22–5.81)
RDW: 14.7 % (ref 11.5–15.5)
WBC: 10.9 10*3/uL — ABNORMAL HIGH (ref 4.0–10.5)
nRBC: 0 % (ref 0.0–0.2)

## 2019-12-10 LAB — COOXEMETRY PANEL
Carboxyhemoglobin: 0.9 % (ref 0.5–1.5)
Methemoglobin: 1 % (ref 0.0–1.5)
O2 Saturation: 69.2 %
Total hemoglobin: 11.4 g/dL — ABNORMAL LOW (ref 12.0–16.0)

## 2019-12-10 MED ORDER — TACROLIMUS 1 MG/ML ORAL SUSPENSION
3.0000 mg | Freq: Two times a day (BID) | ORAL | Status: DC
  Administered 2019-12-10 (×2): 3 mg via ORAL
  Filled 2019-12-10 (×4): qty 3

## 2019-12-10 MED ORDER — SODIUM CHLORIDE 0.9 % IV SOLN: INTRAVENOUS | Status: AC

## 2019-12-10 NOTE — Progress Notes (Signed)
ANTICOAGULATION CONSULT NOTE  Pharmacy Consult for heparin Indication: apical thrombus  No Known Allergies  Patient Measurements: Height: 6\' 1"  (185.4 cm) Weight: 89 kg (196 lb 3.4 oz) (standing scale) IBW/kg (Calculated) : 79.9 Heparin Dosing Weight: 92 kg  Vital Signs: Temp: 99 F (37.2 C) (10/24 0800) BP: 103/69 (10/24 0800) Pulse Rate: 101 (10/24 0800)  Labs: Recent Labs    12/08/19 0545 12/08/19 0545 12/09/19 0407 12/09/19 0921 12/10/19 0426  HGB 12.6*   < >  --  12.3* 11.8*  HCT 37.6*  --   --  36.9* 35.9*  PLT 304  --   --  309 309  HEPARINUNFRC 0.36  --  0.35  --  0.41  CREATININE 4.09*  --  4.65*  --  5.18*   < > = values in this interval not displayed.    Estimated Creatinine Clearance: 17.4 mL/min (A) (by C-G formula based on SCr of 5.18 mg/dL (H)).   Medical History: Past Medical History:  Diagnosis Date  . Chronic kidney disease 04/2009   Kidney Transplant  . Diabetes mellitus   . GERD (gastroesophageal reflux disease)    as needed reflux  . Hypertension   . 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     Assessment: 59 year old male presented with STEMI s/p PCI to mid LAD. Pt noted to have apical smoke on ECHO concerning for early thrombus, confirmed on TEE. Pharmacy asked to continue heparin.   Heparin level remains therapeutic at 0.41, H/H stable, pltc wnl.   Goal of Therapy:  Heparin level 0.3-0.7 units/ml Monitor platelets by anticoagulation protocol: Yes   Plan:  -Continue heparin at 1700 units/h -Daily heparin level and CBC   Arrie Senate, PharmD, BCPS Clinical Pharmacist [phone] Please check AMION for all [place] numbers 12/10/2019

## 2019-12-10 NOTE — Procedures (Signed)
Central Venous Catheter Insertion Procedure Note  Casey [name]  [id number]  1960-06-13  Date:12/10/19  Time:12:16 PM   Provider Performing:Casey [name] Casey [name]   Procedure: Insertion of Non-tunneled Central Venous Catheter(36556) without US guidance  Indication(s) Medication administration  Consent Risks of the procedure as well as the alternatives and risks of each were explained to the patient and/or caregiver.  Consent for the procedure was obtained and is signed in the bedside chart  Anesthesia Topical only with 1% lidocaine   Timeout Verified patient identification, verified procedure, site/side was marked, verified correct patient position, special equipment/implants available, medications/allergies/relevant history reviewed, required imaging and test results available.  Sterile Technique Maximal sterile technique including full sterile barrier drape, hand hygiene, sterile gown, sterile gloves, mask, hair covering, sterile ultrasound probe cover (if used).  Procedure Description Area of catheter insertion was cleaned with chlorhexidine and draped in sterile fashion.  Without real-time ultrasound guidance a central venous catheter was placed into the right internal jugular veinthrough the patient pre-existing introducer sheath following sterile removal of the patients indwelling swan-ganz catheter. Nonpulsatile blood flow and easy flushing noted in all ports.  The catheter was sutured in place and sterile dressing applied.  Complications/Tolerance None; patient tolerated the procedure well. Chest X-ray is ordered to verify placement for internal jugular or subclavian cannulation.   Chest x-ray is not ordered for femoral cannulation.  EBL Minimal, <1cc  Specimen(s) None  Casey Nash, DO [facility] 12/10/2019 12:18 PM

## 2019-12-10 NOTE — Progress Notes (Signed)
Patient ID: Casey [name]., male   DOB: 10-13-1960, 59 y.o.   MRN: [id number]      Advanced Heart Failure Rounding Note  PCP-Cardiologist: No primary care provider on file.   Subjective:    Patient taken on OR 10/19 for Impella 5.5, unable to place due to sludge/early thrombus present at LV apex.  Subsequently returned [place], developed AF/RVR and started on amiodarone, converted to NSR.   Remains on milrinone 0.25 mcg + amio drip.   Off diuretics and given IV fluid yesterday, creatinine up to 5.18.     Ileus resolving, now eating.   PCT 33, afebrile.  He is on Zosyn.   Swan: CVP 4 PA 19/7 CI 2.7 Co-ox 69%  RHC Procedural Findings (on milrinone 0.25 mcg/kg/min): Hemodynamics (mmHg) RA mean 4 RV 20/5 PA 22/4, mean 14 PCWP mean 5 Oxygen saturations: PA 65% AO 98% Cardiac Output (Fick) 4.5  Cardiac Index (Fick) 2.06 Cardiac Output (Thermo) 4.5 Cardiac Index (Thermo) 2.06  Objective:   Weight Range: 89 kg Body mass index is 25.89 kg/m.   Vital Signs:   Temp:  [98.2 F (36.8 C)-99.3 F (37.4 C)] 99.1 F (37.3 C) (10/24 0900) Pulse Rate:  [77-105] 99 (10/24 0900) Resp:  [14-37] 20 (10/24 0900) BP: (90-136)/(62-101) 117/76 (10/24 0900) SpO2:  [91 %-98 %] 95 % (10/24 0900) Weight:  [89 kg] 89 kg (10/24 0500) Last BM Date: 12/09/19  Weight change: Filed Weights   12/08/19 0500 12/09/19 0630 12/10/19 0500  Weight: 89.8 kg 88.1 kg 89 kg    Intake/Output:   Intake/Output Summary (Last 24 hours) at 12/10/2019 0934 Last data filed at 12/10/2019 0900 Gross per 24 hour  Intake 3127.61 ml  Output 1475 ml  Net 1652.61 ml      Physical Exam   CVP 4 General: NAD Neck: No JVD, no thyromegaly or thyroid nodule.  Lungs: Clear to auscultation bilaterally with normal respiratory effort. CV: Nondisplaced PMI.  Heart regular S1/S2, no S3/S4, no murmur.  No peripheral edema.   Abdomen: Soft, nontender, no hepatosplenomegaly, no distention.  Skin: Intact without  lesions or rashes.  Neurologic: Alert and oriented x 3.  Psych: Normal affect. Extremities: No clubbing or cyanosis.  HEENT: Normal.    Telemetry   NSR 90s (personally reviewed)  Labs    CBC Recent Labs    12/09/19 0921 12/10/19 0426  WBC 12.9* 10.9*  HGB 12.3* 11.8*  HCT 36.9* 35.9*  MCV 82.4 84.1  PLT 309 951   Basic Metabolic Panel Recent Labs    12/07/19 2217 12/08/19 0545 12/09/19 0407 12/10/19 0426  NA 137   < > 142 141  K 3.8   < > 3.3* 3.6  CL 103   < > 106 107  CO2 20*   < > 22 21*  GLUCOSE 217*   < > 161* 164*  BUN 58*   < > 60* 55*  CREATININE 3.83*   < > 4.65* 5.18*  CALCIUM 9.6   < > 9.2 9.6  MG 2.8*  --   --   --    < > = values in this interval not displayed.   Liver Function Tests Recent Labs    12/09/19 0407 12/10/19 0426  AST 22 21  ALT 22 22  ALKPHOS 116 151*  BILITOT 1.0 1.3*  PROT 6.4* 6.7  ALBUMIN 2.3* 2.3*   No results for input(s): LIPASE, AMYLASE in the last 72 hours. Cardiac Enzymes No results for input(s): CKTOTAL, CKMB,  CKMBINDEX, TROPONINI in the last 72 hours.  BNP: BNP (last 3 results) No results for input(s): BNP in the last 8760 hours.  ProBNP (last 3 results) No results for input(s): PROBNP in the last 8760 hours.   D-Dimer No results for input(s): DDIMER in the last 72 hours. Hemoglobin A1C No results for input(s): HGBA1C in the last 72 hours. Fasting Lipid Panel No results for input(s): CHOL, HDL, LDLCALC, TRIG, CHOLHDL, LDLDIRECT in the last 72 hours. Thyroid Function Tests No results for input(s): TSH, T4TOTAL, T3FREE, THYROIDAB in the last 72 hours.  Invalid input(s): FREET3  Other results:   Imaging    No results found.   Medications:     Scheduled Medications: . aspirin  81 mg Oral Daily  . Chlorhexidine Gluconate Cloth  6 each Topical Daily  . insulin aspart  0-9 Units Subcutaneous Q4H  . insulin detemir  10 Units Subcutaneous Daily  . metoCLOPramide (REGLAN) injection  5 mg  Intravenous TID  . pantoprazole (PROTONIX) IV  40 mg Intravenous Q24H  . predniSONE  5 mg Oral Q breakfast  . rosuvastatin  40 mg Oral Daily  . sodium bicarbonate  1,300 mg Oral TID  . sodium chloride flush  3 mL Intravenous Q12H  . tacrolimus  3 mg Oral BID  . ticagrelor  90 mg Oral BID    Infusions: . sodium chloride    . sodium chloride 10 mL/hr at 12/09/19 1900  . sodium chloride    . sodium chloride    . sodium chloride    . albumin human    . amiodarone 30 mg/hr (12/10/19 0900)  . heparin 1,700 Units/hr (12/10/19 0900)  . milrinone 0.25 mcg/kg/min (12/10/19 0900)  . piperacillin-tazobactam (ZOSYN)  IV Stopped (12/10/19 0601)    PRN Medications: sodium chloride, acetaminophen, ALPRAZolam, phenol  Assessment/Plan   1. Shock: Ischemic cardiomyopathy with EF 20-25% post-late presentation anterior MI.  concern for cardiogenic shock but possible component of septic shock as well (gut source).  Excellent cardiac output by thermodilution and co-ox.  He is now off pressors, remains on milrinone 0.25. CVP down to 4, creatinine higher.  - Continue milrinone 0.25 for now.   - Not candidate for Impella at this point with developing LV thrombus on TEE. - No diuretic, will give NS 75 cc/hr x 5 hrs.  - If stabilizes and creatinine comes down, consider PCI to LCx system down the road.  - Remove Swan, place central line for CVP and co-ox monitoring and milrinone.  2. CAD: Delayed presentation anterior MI (CP began 10/15, PCI on 10/17).  He had DES to LAD, has residual severe up to 99% mid to distal RCA stenosis that is not revascularizable.  He has 95% mid-distal LCx stenosis and 80% OM3 stenosis that could be intervened upon.  He is on Brilinta 90 bid + ASA 81 + statin post-MI. No chest pain.  - Will eventually need transition to Eliquis for atrial fibrillation, will also transition from Brilinta to Plavix at that time.  - As above, will ideally have eventual PCI to LCx system.  3. AKI on  CKD stage 3: Patient has history of renal transplant at [facility] in 2011.  Baseline creatinine 1.4, now 4.6.  Suspect combination of contrast-induced nephropathy and cardiorenal syndrome in setting of cardiogenic shock.  CVP 4.  - Tacrolimus dose has been decreased, level pending (hopefully will be available this weekend).  - As above, no diuretics and will give gentle IVF.  - Nephrology now  following.  4. Elevated LFTs: Mild elevation, suspect shock liver with hypotension/cardiogenic shock. LFTs normalized. 5. DM2: SSI.  6. Ileus: Abdominal CT confirmed no obstruction.  - Improved, diet advanced.    7. Sludge LV apex/early thrombus: He is on heparin gtt. Eventually to warfarin.   8. ID: Afebrile.  PCT 61-->33.  ?Component of septic shock from gut source given ileus/symptoms, covering for now with Zosyn.  - Continue Zosyn.  9. Atrial fibrillation: Paroxysmal.  Now back in NSR on amiodarone gtt.  - Continue amiodarone gtt while on milrinone.  - Continue heparin gtt, eventually to warfarin given LV thrombus.     CRITICAL CARE Performed by: Loralie Champagne  Total critical care time: 35 minutes  Critical care time was exclusive of separately billable procedures and treating other patients.  Critical care was necessary to treat or prevent imminent or life-threatening deterioration.  Critical care was time spent personally by me on the following activities: development of treatment plan with patient and/or surrogate as well as nursing, discussions with consultants, evaluation of patient's response to treatment, examination of patient, obtaining history from patient or surrogate, ordering and performing treatments and interventions, ordering and review of laboratory studies, ordering and review of radiographic studies, pulse oximetry and re-evaluation of patient's condition.  Loralie Champagne 12/10/2019 9:34 AM

## 2019-12-10 NOTE — Progress Notes (Signed)
[facility] Progress Note    Assessment/ Plan:   1. Acute kidney Injury (oliguric) secondary to cardiogenic shock and diminished perfusion from hypotension (ATN picture) with possible contrast induced injury -at this junction, there is no indication for renal replacement therapy.  Renal function continues to worsen despite gentle IV fluids yesterday.  Agree with more fluids today given low CVP's and looks relatively dry on exam. -maintain MAP >65 to ensure adequate perfusion -Avoid nephrotoxic medications including NSAIDs and iodinated intravenous contrast exposure unless the latter is absolutely indicated.  Preferred narcotic agents for pain control are hydromorphone, fentanyl, and methadone. Morphine should not be used. Avoid Baclofen and avoid oral sodium phosphate and magnesium citrate based laxatives / bowel preps. Continue strict Input and Output monitoring. 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 Marval Regal), underlying disease=HTN and DM. Continue with tacrolimus and home prednisone dose. 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 If needed can use stress dose steroids if persistently hypotensive. Baseline creatinine ~1.3. Transplant ultrasound reviewed. Agree with lower tac dosing as compared to home (drug interaction with amio).  -If renal function is would recommend transfer to his transplant center to expedite work up for possible rejection -Tacrolimus trough still pending, sent 10/19 (goal tacrolimus trough 5-7).  3. AGMA, likely related to AKI, if worsening repeat lactate levels. sodium bicarb 1300mg  TID 4. Shock, cardiogenic +/- septic shock: on zosyn, improved now off all pressors, only on milrinone 5. Ischemic cardiomyopathy, EF 20-25%. Diurese as needed. Not a candidate for impella given LV thrombus. Off lasix 6. Anterior STEMI,  s/p LHC 10/17 7. LV thrombus: on hep gtt 8. Ileus, improving:  Noncon CT revealed ileus and nonobstructing 3 mm calculus in the lower pole of the right iliac fossa renal transplant. Mgmt per primary 9. Leukocytosis: on zosyn   Subjective:   Feels well, no complaints.  No acute events.  Not having bowel movements, ileus seems to be getting better.  Urine output 1.8 L.  Received around 500 cc of isotonic fluids yesterday.   Objective:   BP 112/84   Pulse 99   Temp 99 F (37.2 C)   Resp (!) 29   Ht 6\' 1"  (1.854 m)   Wt 89 kg Comment: standing scale  SpO2 96%   BMI 25.89 kg/m   Intake/Output Summary (Last 24 hours) at 12/10/2019 1019 Last data filed at 12/10/2019 1000 Gross per 24 hour  Intake 3117.66 ml  Output 1475 ml  Net 1642.66 ml   Weight change: 0.9 kg  Physical Exam: Gen:nad, resting comfortably HEENT: dry mucosal membranes [id number], + systolic murmur, no r/g, rrr Resp:cta bl, bl chest expansion Abd: distended, nontender, soft Ext:no edema Neuro: awake, alert, following commands, moves all ext spontaneously  Imaging: No results found.  Labs: BMET Recent Labs  Lab 12/04/19 0034 12/04/19 1245 12/05/19 0725 12/05/19 1548 12/05/19 1714 12/06/19 0246 12/07/19 8144 12/07/19 2217 12/08/19 0545 12/09/19 0407 12/10/19 0426  NA 134*   < > 132*   < > 135 131* 134* 137 139 142 141  K 4.8   < > 4.6   < > 4.3 4.3 4.1 3.8 3.7 3.3* 3.6  CL 102   < > 99  --   --  100 104 103 104 106 107  CO2 22   < > 19*  --   --  18* 17* 20*  21* 22 21*  GLUCOSE 228*   < > 244*  --   --  354* 174* 217* 173* 161* 164*  BUN 20   < > 53*  --   --  70* 58* 58* 58* 60* 55*  CREATININE 1.53*   < > 3.10*  --   --  4.48* 3.62* 3.83* 4.09* 4.65* 5.18*  CALCIUM 9.5   < > 9.8  --   --  8.6* 9.3 9.6 9.7 9.2 9.6  PHOS 2.9  --   --   --   --   --   --   --   --   --   --    < > = values in this interval not displayed.   CBC Recent Labs  Lab 12/04/19 0035 12/05/19 0725 12/07/19 0338  12/08/19 0545 12/09/19 0921 12/10/19 0426  WBC 22.9*   < > 9.6 12.2* 12.9* 10.9*  NEUTROABS 18.5*  --   --   --   --   --   HGB 15.3   < > 10.8* 12.6* 12.3* 11.8*  HCT 46.1   < > 31.9* 37.6* 36.9* 35.9*  MCV 83.4   < > 82.9 82.5 82.4 84.1  PLT 314   < > 220 304 309 309   < > = values in this interval not displayed.    Medications:    . aspirin  81 mg Oral Daily  . Chlorhexidine Gluconate Cloth  6 each Topical Daily  . insulin aspart  0-9 Units Subcutaneous Q4H  . insulin detemir  10 Units Subcutaneous Daily  . metoCLOPramide (REGLAN) injection  5 mg Intravenous TID  . pantoprazole (PROTONIX) IV  40 mg Intravenous Q24H  . predniSONE  5 mg Oral Q breakfast  . rosuvastatin  40 mg Oral Daily  . sodium bicarbonate  1,300 mg Oral TID  . sodium chloride flush  3 mL Intravenous Q12H  . tacrolimus  3 mg Oral BID  . ticagrelor  90 mg Oral BID      Gean Quint, MD [facility] 12/10/2019, 10:19 AM

## 2019-12-11 ENCOUNTER — Encounter (HOSPITAL_COMMUNITY): Payer: Self-pay | Admitting: Cardiology

## 2019-12-11 LAB — COMPREHENSIVE METABOLIC PANEL
ALT: 26 U/L (ref 0–44)
AST: 27 U/L (ref 15–41)
Albumin: 2.3 g/dL — ABNORMAL LOW (ref 3.5–5.0)
Alkaline Phosphatase: 168 U/L — ABNORMAL HIGH (ref 38–126)
Anion gap: 13 (ref 5–15)
BUN: 51 mg/dL — ABNORMAL HIGH (ref 6–20)
CO2: 21 mmol/L — ABNORMAL LOW (ref 22–32)
Calcium: 9.7 mg/dL (ref 8.9–10.3)
Chloride: 106 mmol/L (ref 98–111)
Creatinine, Ser: 5 mg/dL — ABNORMAL HIGH (ref 0.61–1.24)
GFR, Estimated: 13 mL/min — ABNORMAL LOW (ref >60)
Glucose, Bld: 173 mg/dL — ABNORMAL HIGH (ref 70–99)
Potassium: 3.5 mmol/L (ref 3.5–5.1)
Sodium: 140 mmol/L (ref 135–145)
Total Bilirubin: 1 mg/dL (ref 0.3–1.2)
Total Protein: 6.5 g/dL (ref 6.5–8.1)

## 2019-12-11 LAB — GLUCOSE, CAPILLARY
Glucose-Capillary: 158 mg/dL — ABNORMAL HIGH (ref 70–99)
Glucose-Capillary: 165 mg/dL — ABNORMAL HIGH (ref 70–99)
Glucose-Capillary: 243 mg/dL — ABNORMAL HIGH (ref 70–99)
Glucose-Capillary: 281 mg/dL — ABNORMAL HIGH (ref 70–99)
Glucose-Capillary: 210 mg/dL — ABNORMAL HIGH (ref 70–99)

## 2019-12-11 LAB — COOXEMETRY PANEL
Carboxyhemoglobin: 0.7 % (ref 0.5–1.5)
Methemoglobin: 0.8 % (ref 0.0–1.5)
O2 Saturation: 64.2 %
Total hemoglobin: 16.1 g/dL — ABNORMAL HIGH (ref 12.0–16.0)

## 2019-12-11 LAB — CBC
HCT: 35 % — ABNORMAL LOW (ref 39.0–52.0)
Hemoglobin: 11.3 g/dL — ABNORMAL LOW (ref 13.0–17.0)
MCH: 27 pg (ref 26.0–34.0)
MCHC: 32.3 g/dL (ref 30.0–36.0)
MCV: 83.7 fL (ref 80.0–100.0)
Platelets: 305 10*3/uL (ref 150–400)
RBC: 4.18 MIL/uL — ABNORMAL LOW (ref 4.22–5.81)
RDW: 14.6 % (ref 11.5–15.5)
WBC: 9.9 10*3/uL (ref 4.0–10.5)
nRBC: 0 % (ref 0.0–0.2)

## 2019-12-11 LAB — HEPARIN LEVEL (UNFRACTIONATED): Heparin Unfractionated: 0.46 IU/mL (ref 0.30–0.70)

## 2019-12-11 MED ORDER — SODIUM CHLORIDE 0.9% FLUSH
10.0000 mL | Freq: Two times a day (BID) | INTRAVENOUS | Status: DC
  Administered 2019-12-11 – 2019-12-15 (×6): 10 mL

## 2019-12-11 MED ORDER — PANTOPRAZOLE SODIUM 40 MG PO TBEC
40.0000 mg | DELAYED_RELEASE_TABLET | Freq: Every day | ORAL | Status: DC
  Administered 2019-12-11 – 2019-12-15 (×5): 40 mg via ORAL
  Filled 2019-12-11 (×5): qty 1

## 2019-12-11 MED ORDER — TACROLIMUS 1 MG PO CAPS
3.0000 mg | ORAL_CAPSULE | Freq: Two times a day (BID) | ORAL | Status: DC
  Administered 2019-12-11 – 2019-12-15 (×9): 3 mg via ORAL
  Filled 2019-12-11 (×10): qty 3

## 2019-12-11 MED ORDER — SODIUM CHLORIDE 0.9% FLUSH: 10.0000 mL | INTRAVENOUS | Status: DC | PRN

## 2019-12-11 NOTE — Progress Notes (Signed)
Patient ID: Casey Mages., male   DOB: 1960/10/19, 59 y.o.   MRN: [id number]      Advanced Heart Failure Rounding Note  PCP-Cardiologist: No primary care provider on file.   Subjective:    Patient taken on OR 10/19 for Impella 5.5, unable to place due to sludge/early thrombus present at LV apex.  Subsequently returned [place], developed AF/RVR and started on amiodarone, converted to NSR.   Remains on milrinone 0.25 mcg + amio drip, in NSR. CVP 8-9, co-ox 64%.   Off diuretics and given IV fluid again yesterday, creatinine lower at 5.0.  UOP 1025 cc.     Ileus resolved, now eating.   Afebrile, remains on Zosyn.   [facility] Procedural Findings (on milrinone 0.25 mcg/kg/min): Hemodynamics (mmHg) RA mean 4 RV 20/5 PA 22/4, mean 14 PCWP mean 5 Oxygen saturations: PA 65% AO 98% Cardiac Output (Fick) 4.5  Cardiac Index (Fick) 2.06 Cardiac Output (Thermo) 4.5 Cardiac Index (Thermo) 2.06  Objective:   Weight Range: 89.9 kg Body mass index is 26.15 kg/m.   Vital Signs:   Temp:  [98.3 F (36.8 C)-99.3 F (37.4 C)] 98.3 F (36.8 C) (10/25 0700) Pulse Rate:  [93-111] 96 (10/25 0800) Resp:  [19-33] 33 (10/24 1200) BP: (104-140)/(75-91) 121/87 (10/25 0800) SpO2:  [93 %-99 %] 96 % (10/25 0800) Weight:  [89.9 kg] 89.9 kg (10/25 0400) Last BM Date: 12/10/19  Weight change: Filed Weights   12/09/19 0630 12/10/19 0500 12/11/19 0400  Weight: 88.1 kg 89 kg 89.9 kg    Intake/Output:   Intake/Output Summary (Last 24 hours) at 12/11/2019 0926 Last data filed at 12/11/2019 0800 Gross per 24 hour  Intake 1497.09 ml  Output 1025 ml  Net 472.09 ml      Physical Exam   CVP 8-9 General: NAD Neck: JVP 8 cm, no thyromegaly or thyroid nodule.  Lungs: Clear to auscultation bilaterally with normal respiratory effort. CV: Nondisplaced PMI.  Heart regular S1/S2, no S3/S4, no murmur.  No peripheral edema.    Abdomen: Soft, nontender, no hepatosplenomegaly, no distention.  Skin: Intact  without lesions or rashes.  Neurologic: Alert and oriented x 3.  Psych: Normal affect. Extremities: No clubbing or cyanosis.  HEENT: Normal.   Telemetry   NSR 90s (personally reviewed)  Labs    CBC Recent Labs    12/10/19 0426 12/11/19 0403  WBC 10.9* 9.9  HGB 11.8* 11.3*  HCT 35.9* 35.0*  MCV 84.1 83.7  PLT 309 956   Basic Metabolic Panel Recent Labs    12/10/19 0426 12/11/19 0403  NA 141 140  K 3.6 3.5  CL 107 106  CO2 21* 21*  GLUCOSE 164* 173*  BUN 55* 51*  CREATININE 5.18* 5.00*  CALCIUM 9.6 9.7   Liver Function Tests Recent Labs    12/10/19 0426 12/11/19 0403  AST 21 27  ALT 22 26  ALKPHOS 151* 168*  BILITOT 1.3* 1.0  PROT 6.7 6.5  ALBUMIN 2.3* 2.3*   No results for input(s): LIPASE, AMYLASE in the last 72 hours. Cardiac Enzymes No results for input(s): CKTOTAL, CKMB, CKMBINDEX, TROPONINI in the last 72 hours.  BNP: BNP (last 3 results) No results for input(s): BNP in the last 8760 hours.  ProBNP (last 3 results) No results for input(s): PROBNP in the last 8760 hours.   D-Dimer No results for input(s): DDIMER in the last 72 hours. Hemoglobin A1C No results for input(s): HGBA1C in the last 72 hours. Fasting Lipid Panel No results for  input(s): CHOL, HDL, LDLCALC, TRIG, CHOLHDL, LDLDIRECT in the last 72 hours. Thyroid Function Tests No results for input(s): TSH, T4TOTAL, T3FREE, THYROIDAB in the last 72 hours.  Invalid input(s): FREET3  Other results:   Imaging    US Renal Transplant w/Doppler  Result Date: 12/10/2019 CLINICAL DATA:  Acute kidney injury EXAM: ULTRASOUND OF RENAL TRANSPLANT WITH RENAL DOPPLER ULTRASOUND TECHNIQUE: Ultrasound examination of the renal transplant was performed with gray-scale, color and duplex doppler evaluation. COMPARISON:  Abdomen and pelvis CT from 4 days ago. Transplant ultrasound 07/05/2017 FINDINGS: Transplant kidney location: Right lower quadrant Transplant Kidney: Renal measurements: 12.2 x 6.6  x 8.4 cm = volume: 312mL. Stable in size and parenchymal echogenicity. No evidence of mass or hydronephrosis. No peri-transplant fluid collection seen. Color flow in the main renal artery:  Present Color flow in the main renal vein:  Present Duplex Doppler Evaluation: Main Renal Artery Velocity: 80 cm/sec Main Renal Artery Resistive Index: 0.75 Venous waveform in main renal vein:  Present Intrarenal resistive index in upper pole:  0.6 (normal 0.6-0.8; equivocal 0.8-0.9; abnormal >= 0.9) Intrarenal resistive index in lower pole: 0.6 (normal 0.6-0.8; equivocal 0.8-0.9; abnormal >= 0.9) Bladder: Normal for degree of bladder distention. Other findings: No acceleration or dephasing in the main renal artery to suggest stenosis. Mild collecting system fullness at the lower pole, stable from prior CT. A small stone was seen on the comparison CT from 4 days ago. Doppler evaluation has not significantly changed from 07/05/2017. IMPRESSION: No change from 2019 to explain the history of kidney injury. Electronically Signed   By: Monte Fantasia M.D.   On: 12/10/2019 10:44   DG CHEST PORT 1 VIEW  Result Date: 12/10/2019 CLINICAL DATA:  Central line placement EXAM: PORTABLE CHEST 1 VIEW COMPARISON:  12/06/2019 FINDINGS: Interval extubation and removal of NG tube. Swan-Ganz catheter retracted nd. RIGHT IJ sheath remains with tip in the SVC. No pulmonary edema.  No pneumothorax.  LEFT subclavian stent noted. IMPRESSION: No pneumothorax or pulmonary edema. RIGHT central venous line remains. Electronically Signed   By: Suzy Bouchard M.D.   On: 12/10/2019 12:52     Medications:     Scheduled Medications: . aspirin  81 mg Oral Daily  . Chlorhexidine Gluconate Cloth  6 each Topical Daily  . insulin aspart  0-9 Units Subcutaneous Q4H  . insulin detemir  10 Units Subcutaneous Daily  . metoCLOPramide (REGLAN) injection  5 mg Intravenous TID  . pantoprazole (PROTONIX) IV  40 mg Intravenous Q24H  . predniSONE  5 mg Oral  Q breakfast  . rosuvastatin  40 mg Oral Daily  . sodium bicarbonate  1,300 mg Oral TID  . sodium chloride flush  10-40 mL Intracatheter Q12H  . sodium chloride flush  3 mL Intravenous Q12H  . tacrolimus  3 mg Oral BID  . ticagrelor  90 mg Oral BID    Infusions: . sodium chloride 10 mL/hr at 12/09/19 1900  . albumin human    . amiodarone 30 mg/hr (12/11/19 0800)  . heparin 1,700 Units/hr (12/11/19 0800)  . milrinone 0.25 mcg/kg/min (12/11/19 0800)  . piperacillin-tazobactam (ZOSYN)  IV Stopped (12/11/19 0620)    PRN Medications: sodium chloride, acetaminophen, ALPRAZolam, phenol, sodium chloride flush  Assessment/Plan   1. Shock: Ischemic cardiomyopathy with EF 20-25% post-late presentation anterior MI.  concern for cardiogenic shock but possible component of septic shock as well (gut source).  Excellent cardiac output by thermodilution and co-ox.  He is now off pressors, remains on milrinone  0.25. CVP 8-9, creatinine lower at 5.0 today. Co-ox 64%.  - Continue milrinone 0.25 for now as we wait for renal recovery.   - Hold diuretics today, will not give IV fluid with CVP trending up a bit.   - If stabilizes and creatinine comes down, consider PCI to LCx system down the road.  2. CAD: Delayed presentation anterior MI (CP began 10/15, PCI on 10/17).  He had DES to LAD, has residual severe up to 99% mid to distal RCA stenosis that is not revascularizable.  He has 95% mid-distal LCx stenosis and 80% OM3 stenosis that could be intervened upon.  He is on Brilinta 90 bid + ASA 81 + statin post-MI. No chest pain.  - Will eventually need transition to warfarin with LV thrombus, will also transition from Brilinta to Plavix at that time.  - As above, will ideally have eventual PCI to LCx system.  3. AKI on CKD stage 3: Patient has history of renal transplant at [facility] in 2011.  Baseline creatinine 1.4, now 5.0 but starting to trend down.  Suspect combination of contrast-induced nephropathy and  cardiorenal syndrome in setting of cardiogenic shock.  CVP 8-9.  - Tacrolimus dose has been decreased, level pending (hopefully will be available soon).  - As above, no diuretics.  - Nephrology now following.  4. Elevated LFTs: Mild elevation, suspect shock liver with hypotension/cardiogenic shock. LFTs normalized. 5. DM2: SSI.  6. Ileus: Abdominal CT confirmed no obstruction.  - Improved, diet advanced.    7. Sludge LV apex/early thrombus: He is on heparin gtt. Eventually to warfarin.   8. ID: Afebrile.  PCT 61-->33.  ?Component of septic shock from gut source given ileus/symptoms, covering for now with Zosyn, today is day 6.  Afebrile and cultures negative.  - Stop Zosyn after today.  9. Atrial fibrillation: Paroxysmal.  Now back in NSR on amiodarone gtt.  - Continue amiodarone gtt while on milrinone.  - Continue heparin gtt, eventually to warfarin given LV thrombus.    Loralie Champagne 12/11/2019 9:26 AM

## 2019-12-11 NOTE — Progress Notes (Signed)
CARDIAC REHAB PHASE I   PRE:  Rate/Rhythm: 99 SR    BP: sitting 124/82    SaO2: 98 RA  MODE:  Ambulation: 410 ft   POST:  Rate/Rhythm: 112 ST    BP: sitting 130/82     SaO2: 96 RA  Pt ambulated with RW. Weak but motivated, no major c/o. Slow and steady pace. To recliner, VSS. Encouraged IS. [place], ACSM 12/11/2019 2:46 PM

## 2019-12-11 NOTE — Progress Notes (Signed)
ANTICOAGULATION CONSULT NOTE  Pharmacy Consult for heparin Indication: apical thrombus  No Known Allergies  Patient Measurements: Height: 6\' 1"  (185.4 cm) Weight: 89.9 kg (198 lb 3.1 oz) IBW/kg (Calculated) : 79.9 Heparin Dosing Weight: 92 kg  Vital Signs: Temp: 98.3 F (36.8 C) (10/25 0700) Temp Source: Oral (10/25 0358) BP: 121/87 (10/25 0800) Pulse Rate: 96 (10/25 0800)  Labs: Recent Labs    12/09/19 0407 12/09/19 0921 12/09/19 0921 12/10/19 0426 12/11/19 0403  HGB  --  12.3*   < > 11.8* 11.3*  HCT  --  36.9*  --  35.9* 35.0*  PLT  --  309  --  309 305  HEPARINUNFRC 0.35  --   --  0.41 0.46  CREATININE 4.65*  --   --  5.18* 5.00*   < > = values in this interval not displayed.    Estimated Creatinine Clearance: 18 mL/min (A) (by C-G formula based on SCr of 5 mg/dL (H)).   Medical History: Past Medical History:  Diagnosis Date  . Chronic kidney disease 04/2009   Kidney Transplant  . Diabetes mellitus   . GERD (gastroesophageal reflux disease)    as needed reflux  . Hypertension   . 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     Assessment: 59 year old male presented with STEMI s/p PCI to mid LAD. Pt noted to have apical smoke on ECHO concerning for early thrombus, confirmed on TEE. Pharmacy asked to continue heparin.   Heparin level remains therapeutic at 0.4, H/H stable, pltc wnl. No bleeding issues noted.  Goal of Therapy:  Heparin level 0.3-0.7 units/ml Monitor platelets by anticoagulation protocol: Yes   Plan:  -Continue heparin at 1700 units/h -Daily heparin level and CBC  Erin Hearing PharmD., BCPS Clinical Pharmacist 12/11/2019 9:30 AM

## 2019-12-11 NOTE — Progress Notes (Signed)
Physical Therapy Treatment Patient Details Name: Casey [name]. MRN: [id number] DOB: [birth year] Today's Date: 12/11/2019    History of Present Illness Pt is 59 yo male admitted with anterior STEMI.  Underwent cardiac cath on 10/17 and had DES to LAD complicated by post-cath cardiogenic shock and acute on chronic kidney injury. He was transferred to [facility] for intervention.  He went to the cath lab for Swan-Ganz and then TEE in the OR on 10/19. Initially planned for impella however procedure cancelled on finding an LV thrombus on TEE. 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 Intubated 10/19-10/20.    PT Comments    Pt admitted with above diagnosis. Pt was able to ambulate on unit with RW with min guard assist.  Incr distance.  Pt steady overall with RW.  Continue PT as able.  Pt currently with functional limitations due to balance and endurance deficits. Pt will benefit from skilled PT to increase their independence and safety with mobility to allow discharge to the venue listed below.     Follow Up Recommendations  Supervision/Assistance - 24 hour;Home health PT;Other (comment) (may progress to no PT needs)     Equipment Recommendations  Rolling walker with 5" wheels (may progress to no need for RW)    Recommendations for Other Services       Precautions / Restrictions Precautions Precautions: Fall Restrictions Weight Bearing Restrictions: No    Mobility  Bed Mobility Overal bed mobility: Needs Assistance Bed Mobility: Sit to Supine       Sit to supine: Min assist;+2 for safety/equipment   General bed mobility comments: Assist to get LEs back into bed.   Transfers Overall transfer level: Needs assistance   Transfers: Sit to/from Stand Sit to Stand: Min guard         General transfer comment: Pt standing up in bathroom on arrival. Had pt ambulate to the bed and sit down to obtain RW to use and untangle lines.    Ambulation/Gait Ambulation/Gait assistance: Min guard Gait Distance (Feet): 390 Feet Assistive device: Rolling walker (2 wheeled) Gait Pattern/deviations: Step-through pattern;Decreased stride length   Gait velocity interpretation: <1.31 ft/sec, indicative of household ambulator General Gait Details: Cues for posture and to look up; pt with normal gait speed and with turns required assist to control upright walker; mild unsteadiness requiring min A   Stairs             Wheelchair Mobility    Modified Rankin (Stroke Patients Only)       Balance Overall balance assessment: Needs assistance Sitting-balance support: No upper extremity supported Sitting balance-Leahy Scale: Good     Standing balance support: Bilateral upper extremity supported Standing balance-Leahy Scale: Poor Standing balance comment: Relies on UE support and steadying assist by therapist                            Cognition Arousal/Alertness: Awake/alert Behavior During Therapy: WFL for tasks assessed/performed Overall Cognitive Status: Within Functional Limits for tasks assessed                                        Exercises General Exercises - Lower Extremity Ankle Circles/Pumps: AROM;Both;10 reps;Seated Long Arc Quad: AROM;Both;10 reps;Seated    General Comments General comments (skin integrity, edema, etc.): VSS with pt on RA.  Pertinent Vitals/Pain Pain Assessment: No/denies pain    Home Living                      Prior Function            PT Goals (current goals can now be found in the care plan section) Acute Rehab PT Goals Patient Stated Goal: return home Progress towards PT goals: Progressing toward goals    Frequency    Min 3X/week      PT Plan Current plan remains appropriate    Co-evaluation              AM-PAC PT "6 Clicks" Mobility   Outcome Measure  Help needed turning from your back to your side while  in a flat bed without using bedrails?: A Little Help needed moving from lying on your back to sitting on the side of a flat bed without using bedrails?: A Little Help needed moving to and from a bed to a chair (including a wheelchair)?: A Little Help needed standing up from a chair using your arms (e.g., wheelchair or bedside chair)?: A Little Help needed to walk in hospital room?: A Little Help needed climbing 3-5 steps with a railing? : A Lot 6 Click Score: 17    End of Session Equipment Utilized During Treatment: Gait belt Activity Tolerance: Patient tolerated treatment well Patient left: in bed;with call bell/phone within reach;with SCD's reapplied;with family/visitor present;with bed alarm set Nurse Communication: Mobility status PT Visit Diagnosis: Unsteadiness on feet (R26.81);Muscle weakness (generalized) (M62.81)     Time: 2426-8341 PT Time Calculation (min) (ACUTE ONLY): 25 min  Charges:  $Gait Training: 23-37 mins                     Tydarius Yawn W,PT [place] Pager:  [phone]  Office:  [place] 12/11/2019, 11:53 AM

## 2019-12-11 NOTE — Progress Notes (Signed)
[facility] Progress Note    Assessment/ Plan:   1. Acute kidney Injury secondary to cardiogenic shock and diminished perfusion from hypotension (ATN picture) with possible contrast induced injury -at this junction, there is no indication for renal replacement therapy. In fact, crt trended down today-  More recent U/A less RBC-  Stable protein-  I think low chance of actual rejection -maintain MAP >65 to ensure adequate perfusion -Avoid nephrotoxic medications including NSAIDs and iodinated intravenous contrast exposure unless the latter is absolutely indicated.  Preferred narcotic agents for pain control are hydromorphone, fentanyl, and methadone. Morphine should not be used. Avoid Baclofen and avoid oral sodium phosphate and magnesium citrate based laxatives / bowel preps. Continue strict Input and Output monitoring. 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 Marval Regal), underlying disease=HTN and DM. Continue with tacrolimus and home prednisone dose. 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 Transplant ultrasound reviewed. Agree with lower tac dosing as compared to home (drug interaction with amio).  -Tacrolimus trough still pending, sent 10/19 (goal tacrolimus trough 5-7). No changes today - have low suspicion of rejection 3. AGMA, likely related to AKI, if worsening repeat lactate levels. sodium bicarb 1300mg  TID- overall improved 4. Shock, cardiogenic +/- septic shock: on zosyn, improved now off all pressors, only on milrinone 5. Ischemic cardiomyopathy, EF 20-25%. Diurese as needed. Not a candidate for impella given LV thrombus. Off lasix 6. Anterior STEMI, s/p LHC 10/17 7. LV thrombus: on hep gtt 8. Ileus, improving:  Noncon CT revealed ileus and nonobstructing 3 mm calculus in the lower pole of the right iliac  fossa renal transplant. Says appetite is good, no nausea 9. Leukocytosis: on zosyn   Subjective:   Feels well, no complaints.  No acute events.  crt has trended down for first time.  Urine output 1 L- was 500 positive    Objective:   BP 121/87   Pulse 96   Temp 98.3 F (36.8 C)   Resp (!) 33   Ht 6\' 1"  (1.854 m)   Wt 89.9 kg   SpO2 96%   BMI 26.15 kg/m   Intake/Output Summary (Last 24 hours) at 12/11/2019 0913 Last data filed at 12/11/2019 0800 Gross per 24 hour  Intake 1497.09 ml  Output 1025 ml  Net 472.09 ml   Weight change: 0.9 kg  Physical Exam: Gen:nad, resting comfortably HEENT: dry mucosal membranes ONG:E9B2, + systolic murmur, no r/g, rrr Resp:cta bl, bl chest expansion Abd: distended, nontender, soft Ext:no edema Neuro: awake, alert, following commands, moves all ext spontaneously  Imaging: US Renal Transplant w/Doppler  Result Date: 12/10/2019 CLINICAL DATA:  Acute kidney injury EXAM: ULTRASOUND OF RENAL TRANSPLANT WITH RENAL DOPPLER ULTRASOUND TECHNIQUE: Ultrasound examination of the renal transplant was performed with gray-scale, color and duplex doppler evaluation. COMPARISON:  Abdomen and pelvis CT from 4 days ago. Transplant ultrasound 07/05/2017 FINDINGS: Transplant kidney location: Right lower quadrant Transplant Kidney: Renal measurements: 12.2 x 6.6 x 8.4 cm = volume: 347mL. Stable in size and parenchymal echogenicity. No evidence of mass or hydronephrosis. No peri-transplant fluid collection seen. Color flow in the main renal artery:  Present Color flow in the main renal vein:  Present Duplex Doppler Evaluation: Main Renal Artery Velocity: 80 cm/sec Main Renal Artery Resistive Index: 0.75 Venous waveform in main renal vein:  Present Intrarenal resistive index in upper pole:  0.6 (normal 0.6-0.8; equivocal 0.8-0.9; abnormal >= 0.9) Intrarenal resistive index in lower pole: 0.6 (normal 0.6-0.8; equivocal 0.8-0.9; abnormal >= 0.9) Bladder: Normal for degree  of bladder distention. Other findings: No acceleration or dephasing in the main renal artery to suggest stenosis. Mild collecting system fullness at the lower pole, stable from prior CT. A small stone was seen on the comparison CT from 4 days ago. Doppler evaluation has not significantly changed from 07/05/2017. IMPRESSION: No change from 2019 to explain the history of kidney injury. Electronically Signed   By: Monte Fantasia M.D.   On: 12/10/2019 10:44   DG CHEST PORT 1 VIEW  Result Date: 12/10/2019 CLINICAL DATA:  Central line placement EXAM: PORTABLE CHEST 1 VIEW COMPARISON:  12/06/2019 FINDINGS: Interval extubation and removal of NG tube. Swan-Ganz catheter retracted nd. RIGHT IJ sheath remains with tip in the SVC. No pulmonary edema.  No pneumothorax.  LEFT subclavian stent noted. IMPRESSION: No pneumothorax or pulmonary edema. RIGHT central venous line remains. Electronically Signed   By: Suzy Bouchard M.D.   On: 12/10/2019 12:52    Labs: BMET Recent Labs  Lab 12/06/19 0246 12/07/19 5784 12/07/19 2217 12/08/19 0545 12/09/19 0407 12/10/19 0426 12/11/19 0403  NA 131* 134* 137 139 142 141 140  K 4.3 4.1 3.8 3.7 3.3* 3.6 3.5  CL 100 104 103 104 106 107 106  CO2 18* 17* 20* 21* 22 21* 21*  GLUCOSE 354* 174* 217* 173* 161* 164* 173*  BUN 70* 58* 58* 58* 60* 55* 51*  CREATININE 4.48* 3.62* 3.83* 4.09* 4.65* 5.18* 5.00*  CALCIUM 8.6* 9.3 9.6 9.7 9.2 9.6 9.7   CBC Recent Labs  Lab 12/08/19 0545 12/09/19 0921 12/10/19 0426 12/11/19 0403  WBC 12.2* 12.9* 10.9* 9.9  HGB 12.6* 12.3* 11.8* 11.3*  HCT 37.6* 36.9* 35.9* 35.0*  MCV 82.5 82.4 84.1 83.7  PLT 304 309 309 305    Medications:    . aspirin  81 mg Oral Daily  . Chlorhexidine Gluconate Cloth  6 each Topical Daily  . insulin aspart  0-9 Units Subcutaneous Q4H  . insulin detemir  10 Units Subcutaneous Daily  . metoCLOPramide (REGLAN) injection  5 mg Intravenous TID  . pantoprazole (PROTONIX) IV  40 mg Intravenous Q24H   . predniSONE  5 mg Oral Q breakfast  . rosuvastatin  40 mg Oral Daily  . sodium bicarbonate  1,300 mg Oral TID  . sodium chloride flush  10-40 mL Intracatheter Q12H  . sodium chloride flush  3 mL Intravenous Q12H  . tacrolimus  3 mg Oral BID  . ticagrelor  90 mg Oral BID      [facility] 12/11/2019, 9:13 AM

## 2019-12-12 LAB — CBC
HCT: 36.4 % — ABNORMAL LOW (ref 39.0–52.0)
Hemoglobin: 11.8 g/dL — ABNORMAL LOW (ref 13.0–17.0)
MCH: 26.9 pg (ref 26.0–34.0)
MCHC: 32.4 g/dL (ref 30.0–36.0)
MCV: 83.1 fL (ref 80.0–100.0)
Platelets: 324 10*3/uL (ref 150–400)
RBC: 4.38 MIL/uL (ref 4.22–5.81)
RDW: 14.6 % (ref 11.5–15.5)
WBC: 9.8 10*3/uL (ref 4.0–10.5)
nRBC: 0 % (ref 0.0–0.2)

## 2019-12-12 LAB — COMPREHENSIVE METABOLIC PANEL
ALT: 33 U/L (ref 0–44)
AST: 31 U/L (ref 15–41)
Albumin: 2.3 g/dL — ABNORMAL LOW (ref 3.5–5.0)
Alkaline Phosphatase: 185 U/L — ABNORMAL HIGH (ref 38–126)
Anion gap: 12 (ref 5–15)
BUN: 42 mg/dL — ABNORMAL HIGH (ref 6–20)
CO2: 19 mmol/L — ABNORMAL LOW (ref 22–32)
Calcium: 9.6 mg/dL (ref 8.9–10.3)
Chloride: 106 mmol/L (ref 98–111)
Creatinine, Ser: 4.71 mg/dL — ABNORMAL HIGH (ref 0.61–1.24)
GFR, Estimated: 14 mL/min — ABNORMAL LOW (ref >60)
Glucose, Bld: 192 mg/dL — ABNORMAL HIGH (ref 70–99)
Potassium: 3.5 mmol/L (ref 3.5–5.1)
Sodium: 137 mmol/L (ref 135–145)
Total Bilirubin: 1.2 mg/dL (ref 0.3–1.2)
Total Protein: 6.7 g/dL (ref 6.5–8.1)

## 2019-12-12 LAB — GLUCOSE, CAPILLARY
Glucose-Capillary: 244 mg/dL — ABNORMAL HIGH (ref 70–99)
Glucose-Capillary: 225 mg/dL — ABNORMAL HIGH (ref 70–99)
Glucose-Capillary: 264 mg/dL — ABNORMAL HIGH (ref 70–99)
Glucose-Capillary: 254 mg/dL — ABNORMAL HIGH (ref 70–99)
Glucose-Capillary: 195 mg/dL — ABNORMAL HIGH (ref 70–99)

## 2019-12-12 LAB — COOXEMETRY PANEL
Carboxyhemoglobin: 0.8 % (ref 0.5–1.5)
Carboxyhemoglobin: 0.7 % (ref 0.5–1.5)
Methemoglobin: 0.7 % (ref 0.0–1.5)
Methemoglobin: 0.9 % (ref 0.0–1.5)
O2 Saturation: 52.4 %
O2 Saturation: 65.2 %
Total hemoglobin: 10.4 g/dL — ABNORMAL LOW (ref 12.0–16.0)
Total hemoglobin: 12.2 g/dL (ref 12.0–16.0)

## 2019-12-12 LAB — HEPARIN LEVEL (UNFRACTIONATED): Heparin Unfractionated: 0.32 IU/mL (ref 0.30–0.70)

## 2019-12-12 MED ORDER — COUMADIN BOOK
Freq: Once | Status: AC
  Filled 2019-12-12: qty 1

## 2019-12-12 MED ORDER — CLOPIDOGREL BISULFATE 75 MG PO TABS
600.0000 mg | ORAL_TABLET | Freq: Once | ORAL | Status: AC
  Administered 2019-12-12: 600 mg via ORAL
  Filled 2019-12-12: qty 8

## 2019-12-12 MED ORDER — WARFARIN SODIUM 7.5 MG PO TABS
7.5000 mg | ORAL_TABLET | Freq: Once | ORAL | Status: AC
  Administered 2019-12-12: 7.5 mg via ORAL
  Filled 2019-12-12: qty 1

## 2019-12-12 MED ORDER — WARFARIN - PHARMACIST DOSING INPATIENT: Freq: Every day | Status: DC

## 2019-12-12 MED ORDER — CLOPIDOGREL BISULFATE 75 MG PO TABS
75.0000 mg | ORAL_TABLET | Freq: Every day | ORAL | Status: DC
  Administered 2019-12-13 – 2019-12-15 (×3): 75 mg via ORAL
  Filled 2019-12-12 (×3): qty 1

## 2019-12-12 MED ORDER — ISOSORB DINITRATE-HYDRALAZINE 20-37.5 MG PO TABS
0.5000 | ORAL_TABLET | Freq: Three times a day (TID) | ORAL | Status: DC
  Administered 2019-12-12 – 2019-12-15 (×9): 0.5 via ORAL
  Filled 2019-12-12 (×9): qty 1

## 2019-12-12 NOTE — Progress Notes (Addendum)
Inpatient Diabetes Program Recommendations  AACE/ADA: New Consensus Statement on Inpatient Glycemic Control (2015)  Target Ranges:  Prepandial:   less than 140 mg/dL      Peak postprandial:   less than 180 mg/dL (1-2 hours)      Critically ill patients:  140 - 180 mg/dL   Lab Results  Component Value Date   GLUCAP 244 (H) 12/12/2019   HGBA1C 7.8 (H) 12/03/2019    Review of Glycemic Control Results for OLIVERIO, CHO (MRN [id number]) as of 12/12/2019 10:58  Ref. Range 12/11/2019 08:03 12/11/2019 11:29 12/11/2019 15:35 12/11/2019 19:44 12/12/2019 09:36  Glucose-Capillary Latest Ref Range: 70 - 99 mg/dL 165 (H) 243 (H) 281 (H) 210 (H) 244 (H)    Inpatient Diabetes Program Recommendations:    -Novolog 0-9 units tid with meals & 0-5 qhs -Novolog 3 units tid with meals if eats at least 50% of meal  Will continue to follow while inpatient.  Thank you, Reche Dixon, RN, BSN Diabetes Coordinator Inpatient Diabetes Program [phone] (team pager from 8a-5p)

## 2019-12-12 NOTE — Progress Notes (Signed)
CARDIAC REHAB PHASE I   PRE:  Rate/Rhythm: 107 ST    BP: sitting 148/98    SaO2: 98 RA   Came to ambulate and got pt ready, to EOB. However he is greatly complaining of his IJ catheter being very uncomfortable. Nursing has been working with it but it is currently losing tape. Put pt back in bed for now. PT to see later and he can ambulate after IV therapy adjusts dressing. [id number]  [place], ACSM 12/12/2019 10:47 AM

## 2019-12-12 NOTE — Progress Notes (Addendum)
Patient ID: Jenelle Mages., male   DOB: 1960/02/29, 60 y.o.   MRN: [id number]      Advanced Heart Failure Rounding Note  PCP-Cardiologist: No primary care provider on file.   Subjective:    Patient taken on OR 10/19 for Impella 5.5, unable to place due to sludge/early thrombus present at LV apex.  Subsequently returned [place], developed AF/RVR and started on amiodarone, converted to NSR.   Remains on milrinone 0.25 mcg + amio drip, in NSR. CVP 9. Co-ox lower today, 64>>52%.   SCr continues to trend down, 5.18>>5.00>>4.71.   No complaints today. Feels decent.     [facility] Procedural Findings (on milrinone 0.25 mcg/kg/min): Hemodynamics (mmHg) RA mean 4 RV 20/5 PA 22/4, mean 14 PCWP mean 5 Oxygen saturations: PA 65% AO 98% Cardiac Output (Fick) 4.5  Cardiac Index (Fick) 2.06 Cardiac Output (Thermo) 4.5 Cardiac Index (Thermo) 2.06  Objective:   Weight Range: 89.9 kg Body mass index is 26.15 kg/m.   Vital Signs:   Temp:  [97.9 F (36.6 C)-98.7 F (37.1 C)] 97.9 F (36.6 C) (10/26 0350) Pulse Rate:  [89-105] 99 (10/25 2243) Resp:  [24] 24 (10/25 2243) BP: (119-165)/(74-101) 158/86 (10/25 2243) SpO2:  [95 %-98 %] 98 % (10/25 2243) Weight:  [89.9 kg] 89.9 kg (10/26 0350) Last BM Date: 12/11/19  Weight change: Filed Weights   12/10/19 0500 12/11/19 0400 12/12/19 0350  Weight: 89 kg 89.9 kg 89.9 kg    Intake/Output:   Intake/Output Summary (Last 24 hours) at 12/12/2019 0747 Last data filed at 12/12/2019 0500 Gross per 24 hour  Intake 1175.5 ml  Output 600 ml  Net 575.5 ml      Physical Exam    CVP 9  General:  Well appearing, laying in bed. No respiratory difficulty HEENT: normal Neck: supple. JVD elevated to jaw. + Rt IJ CVC, Carotids 2+ bilat; no bruits. No lymphadenopathy or thyromegaly appreciated. Cor: PMI nondisplaced. Regular rate & rhythm. No rubs, gallops or murmurs. Lungs: clear Abdomen: soft, nontender, nondistended. No hepatosplenomegaly. No  bruits or masses. Good bowel sounds. Extremities: no cyanosis, clubbing, rash, edema Neuro: alert & oriented x 3, cranial nerves grossly intact. moves all 4 extremities w/o difficulty. Affect pleasant.   Telemetry   NSR 90s (personally reviewed)  Labs    CBC Recent Labs    12/11/19 0403 12/12/19 0227  WBC 9.9 9.8  HGB 11.3* 11.8*  HCT 35.0* 36.4*  MCV 83.7 83.1  PLT 305 222   Basic Metabolic Panel Recent Labs    12/11/19 0403 12/12/19 0227  NA 140 137  K 3.5 3.5  CL 106 106  CO2 21* 19*  GLUCOSE 173* 192*  BUN 51* 42*  CREATININE 5.00* 4.71*  CALCIUM 9.7 9.6   Liver Function Tests Recent Labs    12/11/19 0403 12/12/19 0227  AST 27 31  ALT 26 33  ALKPHOS 168* 185*  BILITOT 1.0 1.2  PROT 6.5 6.7  ALBUMIN 2.3* 2.3*   No results for input(s): LIPASE, AMYLASE in the last 72 hours. Cardiac Enzymes No results for input(s): CKTOTAL, CKMB, CKMBINDEX, TROPONINI in the last 72 hours.  BNP: BNP (last 3 results) No results for input(s): BNP in the last 8760 hours.  ProBNP (last 3 results) No results for input(s): PROBNP in the last 8760 hours.   D-Dimer No results for input(s): DDIMER in the last 72 hours. Hemoglobin A1C No results for input(s): HGBA1C in the last 72 hours. Fasting Lipid Panel No results for  input(s): CHOL, HDL, LDLCALC, TRIG, CHOLHDL, LDLDIRECT in the last 72 hours. Thyroid Function Tests No results for input(s): TSH, T4TOTAL, T3FREE, THYROIDAB in the last 72 hours.  Invalid input(s): FREET3  Other results:   Imaging    No results found.   Medications:     Scheduled Medications: . aspirin  81 mg Oral Daily  . Chlorhexidine Gluconate Cloth  6 each Topical Daily  . insulin aspart  0-9 Units Subcutaneous Q4H  . insulin detemir  10 Units Subcutaneous Daily  . metoCLOPramide (REGLAN) injection  5 mg Intravenous TID  . pantoprazole  40 mg Oral Daily  . predniSONE  5 mg Oral Q breakfast  . rosuvastatin  40 mg Oral Daily  .  sodium bicarbonate  1,300 mg Oral TID  . sodium chloride flush  10-40 mL Intracatheter Q12H  . sodium chloride flush  3 mL Intravenous Q12H  . tacrolimus  3 mg Oral BID  . ticagrelor  90 mg Oral BID    Infusions: . sodium chloride 10 mL/hr at 12/09/19 1900  . albumin human    . amiodarone 30 mg/hr (12/11/19 2207)  . heparin 1,700 Units/hr (12/11/19 2200)  . milrinone 0.25 mcg/kg/min (12/12/19 0457)    PRN Medications: sodium chloride, acetaminophen, ALPRAZolam, phenol, sodium chloride flush  Assessment/Plan   1. Shock: Ischemic cardiomyopathy with EF 20-25% post-late presentation anterior MI.  Concern for cardiogenic shock but possible component of septic shock as well (gut source).  Excellent cardiac output by thermodilution and co-ox.  He is now off pressors, remains on milrinone 0.25. CVP 9. Wt stable. Creatinine lower at 4.7 today. Co-ox 52%, Will repeat   - Continue milrinone 0.25 for now as we wait for renal recovery.   - Continue to hold diuretics today, will not give IV fluid with CVP at 9.   - If stabilizes and creatinine comes down, consider PCI to LCx system down the road.  2. CAD: Delayed presentation anterior MI (CP began 10/15, PCI on 10/17).  He had DES to LAD, has residual severe up to 99% mid to distal RCA stenosis that is not revascularizable.  He has 95% mid-distal LCx stenosis and 80% OM3 stenosis that could be intervened upon.  He is on Brilinta 90 bid + ASA 81 + statin post-MI. No chest pain.  - Will eventually need transition to warfarin with LV thrombus, will also transition from Brilinta to Plavix at that time.  - As above, will ideally have eventual PCI to LCx system.  3. AKI on CKD stage 3: Patient has history of renal transplant at [facility] in 2011.  Baseline creatinine 1.4, peaked at 5.18 but starting to trend down, 4.7 today.  Suspect combination of contrast-induced nephropathy and cardiorenal syndrome in setting of cardiogenic shock.  CVP 9.  - Tacrolimus dose  has been decreased, level pending (hopefully will be available soon).  - As above, no diuretics.  - Nephrology now following.  4. Elevated LFTs: Mild elevation, suspect shock liver with hypotension/cardiogenic shock. LFTs normalized. 5. DM2: SSI.  6. Ileus: Abdominal CT confirmed no obstruction.  - Improved, diet advanced.    7. Sludge LV apex/early thrombus: He is on heparin gtt. Eventually to warfarin.   8. ID: Afebrile.  PCT 61-->33.  ?Component of septic shock from gut source given ileus/symptoms, treated w/ 7 day course of Zosyn. Afebrile and cultures negative.  9. Atrial fibrillation: Paroxysmal.  Now back in NSR on amiodarone gtt.  - Continue amiodarone gtt while on milrinone.  -  Continue heparin gtt, eventually to warfarin given LV thrombus.    Lyda Jester, PA-C  12/12/2019 7:47 AM  Patient seen with PA, agree with the above note .  Repeat co-ox adequate at 65%.  Creatinine trending down, 4.71.  SBP in 140s. CVP 9.  He has been up and walking.   General: NAD Neck: No JVD, no thyromegaly or thyroid nodule.  Lungs: Clear to auscultation bilaterally with normal respiratory effort. CV: Nondisplaced PMI.  Heart regular S1/S2, no S3/S4, no murmur.  No peripheral edema.   Abdomen: Soft, nontender, no hepatosplenomegaly, no distention.  Skin: Intact without lesions or rashes.  Neurologic: Alert and oriented x 3.  Psych: Normal affect. Extremities: No clubbing or cyanosis.  HEENT: Normal.   Creatinine now trending down, Suspect he will not need HD.  Also will probably not intervene on LCx until creatinine has returned to normal for a while.   - Think we can start him on warfarin => will use rather than DOAC given LV thrombus + afib.  He will continue heparin bridge.  - With initiation of warfarin, will transition from Brilinta to Plavix.   - Will stop ASA after 1 month post-PCI.   Stable BP and CVP.  No diuretic but will add Bidil 1/2 tab tid.  Continue milrinone while  creatinine comes down to maintain cardiac output.   Loralie Champagne 12/12/2019

## 2019-12-12 NOTE — Progress Notes (Signed)
PT Cancellation Note  Patient Details Name: Casey [name]. MRN: [id number] DOB: 02-12-1961   Cancelled Treatment:    Reason Eval/Treat Not Completed: (P) Medical issues which prohibited therapy (Pt continues to require dressing change to IJ cath site as it is not secure.  PTA has checked multiple times today. Will defer tx today at this time.)   Marilin Kofman Eli Hose 12/12/2019, 4:53 PM  Erasmo Leventhal , PTA Acute Rehabilitation Services Pager [phone] Office [phone]

## 2019-12-12 NOTE — TOC Benefit Eligibility Note (Signed)
Transition of Care [facility]) Benefit Eligibility Note    Patient Details  Name: Casey [name]. MRN: [id number] Date of Birth: [birth year]   Medication/Dose: BRILINTA  90 MG BID  Covered?: Yes  Tier:  (TIER- 1 DRUG)  Prescription Coverage [facility]  Spoke with Person/Company/Phone Number:: BRITTANY  @   [facility] RX #  [phone] OPT- MEMBER  Co-Pay: $30.00  Prior Approval: No  Deductible: Met  Additional Notes: TICAGRELOR- Crecencio Mc Phone Number: 12/12/2019, 9:39 AM

## 2019-12-12 NOTE — Progress Notes (Signed)
Casey [name] Progress Note    Assessment/ Plan:   1. 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 Marval Regal), underlying disease=HTN and DM. Continue with tacrolimus and home prednisone dose. 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 Transplant ultrasound reviewed. Agree with lower tac dosing as compared to home (drug interaction with amio).  -Tacrolimus trough still pending, sent 10/19 (goal tacrolimus trough 5-7). No changes today - have low suspicion of rejection 3. AGMA, likely related to AKI, if worsening repeat lactate levels. sodium bicarb 1300mg  TID- overall improved 4. Shock, cardiogenic +/- septic shock: on zosyn, improved now off all pressors, only on milrinone 5. Ischemic cardiomyopathy, EF 20-25%. Diurese as needed. Not a candidate for impella given LV thrombus. Off lasix 6. Anterior STEMI, s/p LHC 10/17 7. LV thrombus: on hep gtt 8. Ileus, improving:  Noncon CT revealed ileus and nonobstructing 3 mm calculus in the lower pole of the right iliac fossa renal transplant. Says appetite is good, no nausea 9. Leukocytosis: on zosyn   Subjective:   Feels well, no complaints.  No acute events.  Moved to floor bed- crt down again - uop not well recorded    Objective:   BP (!) 129/91 (BP Location: Right Leg)   Pulse 97   Temp 98.3 F (36.8 C) (Oral)   Resp 19   Ht 6\' 1"  (1.854 m)   Wt 89.9 kg   SpO2 99%   BMI 26.15 kg/m   Intake/Output Summary (Last 24 hours) at 12/12/2019 1137 Last data  filed at 12/12/2019 0802 Gross per 24 hour  Intake 1013.07 ml  Output 825 ml  Net 188.07 ml   Weight change: 0.003 kg  Physical Exam: Gen:nad, resting comfortably HEENT: dry mucosal membranes [id number], + systolic murmur, no r/g, rrr Resp:cta bl, bl chest expansion Abd: distended, nontender, soft Ext:no edema Neuro: awake, alert, following commands, moves all ext spontaneously  Imaging: DG CHEST PORT 1 VIEW  Result Date: 12/10/2019 CLINICAL DATA:  Central line placement EXAM: PORTABLE CHEST 1 VIEW COMPARISON:  12/06/2019 FINDINGS: Interval extubation and removal of NG tube. Swan-Ganz catheter retracted nd. RIGHT IJ sheath remains with tip in the SVC. No pulmonary edema.  No pneumothorax.  LEFT subclavian stent noted. IMPRESSION: No pneumothorax or pulmonary edema. RIGHT central venous line remains. Electronically Signed   By: Suzy Bouchard M.D.   On: 12/10/2019 12:52    Labs: BMET Recent Labs  Lab 12/07/19 0338 12/07/19 2217 12/08/19 0545 12/09/19 0407 12/10/19 0426 12/11/19 0403 12/12/19 0227  NA 134* 137 139 142 141 140 137  K 4.1 3.8 3.7 3.3* 3.6 3.5 3.5  CL 104 103 104 106 107 106 106  CO2 17* 20* 21* 22 21* 21* 19*  GLUCOSE 174* 217* 173* 161* 164* 173* 192*  BUN 58* 58* 58* 60* 55* 51* 42*  CREATININE 3.62* 3.83* 4.09* 4.65* 5.18* 5.00* 4.71*  CALCIUM 9.3 9.6 9.7 9.2 9.6 9.7 9.6   CBC Recent Labs  Lab 12/09/19 0921 12/10/19 0426 12/11/19 0403 12/12/19 0227  WBC 12.9* 10.9* 9.9 9.8  HGB 12.3* 11.8* 11.3* 11.8*  HCT 36.9* 35.9* 35.0* 36.4*  MCV 82.4 84.1 83.7 83.1  PLT 309 309 305 324    Medications:    . aspirin  81 mg Oral Daily  . Chlorhexidine Gluconate Cloth  6 each Topical Daily  . insulin aspart  0-9 Units Subcutaneous Q4H  . insulin detemir  10 Units Subcutaneous Daily  . metoCLOPramide (REGLAN) injection  5 mg Intravenous TID  . pantoprazole  40 mg Oral Daily  . predniSONE  5 mg Oral Q breakfast  . rosuvastatin  40 mg Oral Daily  .  sodium bicarbonate  1,300 mg Oral TID  . sodium chloride flush  10-40 mL Intracatheter Q12H  . sodium chloride flush  3 mL Intravenous Q12H  . tacrolimus  3 mg Oral BID  . ticagrelor  90 mg Oral BID      [name] 12/12/2019, 11:37 AM

## 2019-12-12 NOTE — Progress Notes (Signed)
[place] for heparin>>warfarin Indication: apical thrombus  No Known Allergies  Patient Measurements: Height: 6\' 1"  (185.4 cm) Weight: 89.9 kg (198 lb 3.2 oz) IBW/kg (Calculated) : 79.9 Heparin Dosing Weight: 92 kg  Vital Signs: Temp: 98.2 F (36.8 C) (10/26 1224) Temp Source: Oral (10/26 1224) BP: 147/92 (10/26 1224) Pulse Rate: 100 (10/26 1224)  Labs: Recent Labs    12/10/19 0426 12/10/19 0426 12/11/19 0403 12/12/19 0227  HGB 11.8*   < > 11.3* 11.8*  HCT 35.9*  --  35.0* 36.4*  PLT 309  --  305 324  HEPARINUNFRC 0.41  --  0.46 0.32  CREATININE 5.18*  --  5.00* 4.71*   < > = values in this interval not displayed.    Estimated Creatinine Clearance: 19.1 mL/min (A) (by C-G formula based on SCr of 4.71 mg/dL (H)).   Medical History: Past Medical History:  Diagnosis Date  . Chronic kidney disease 04/2009   Kidney Transplant  . Diabetes mellitus   . GERD (gastroesophageal reflux disease)    as needed reflux  . Hypertension   . 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     Assessment: 59 year old male presented with STEMI s/p PCI to mid LAD. Pt noted to have apical smoke on ECHO concerning for early thrombus, confirmed on TEE. Pharmacy asked to continue heparin.   Heparin level remains therapeutic at 0.32, H/H stable, pltc wnl. No bleeding issues noted. New orders to change ticagrelor to clopidogrel and start warfarin tonight. Baseline INR 1.3 on 10/19.   Goal of Therapy:  INR goal 2-3 Heparin level 0.3-0.7 units/ml Monitor platelets by anticoagulation protocol: Yes   Plan:  -Continue heparin at 1700 units/h -Daily heparin level and CBC -Warfarin 7.5mg  tonight -Daily INR  Erin Hearing PharmD., BCPS Clinical Pharmacist 12/12/2019 2:12 PM

## 2019-12-13 LAB — PROTIME-INR
INR: 1.7 — ABNORMAL HIGH (ref 0.8–1.2)
Prothrombin Time: 19 seconds — ABNORMAL HIGH (ref 11.4–15.2)

## 2019-12-13 LAB — HEPARIN LEVEL (UNFRACTIONATED)
Heparin Unfractionated: 0.19 IU/mL — ABNORMAL LOW (ref 0.30–0.70)
Heparin Unfractionated: 0.21 IU/mL — ABNORMAL LOW (ref 0.30–0.70)
Heparin Unfractionated: 0.47 IU/mL (ref 0.30–0.70)

## 2019-12-13 LAB — COMPREHENSIVE METABOLIC PANEL
ALT: 32 U/L (ref 0–44)
AST: 28 U/L (ref 15–41)
Albumin: 2.1 g/dL — ABNORMAL LOW (ref 3.5–5.0)
Alkaline Phosphatase: 156 U/L — ABNORMAL HIGH (ref 38–126)
Anion gap: 11 (ref 5–15)
BUN: 36 mg/dL — ABNORMAL HIGH (ref 6–20)
CO2: 18 mmol/L — ABNORMAL LOW (ref 22–32)
Calcium: 9 mg/dL (ref 8.9–10.3)
Chloride: 109 mmol/L (ref 98–111)
Creatinine, Ser: 3.93 mg/dL — ABNORMAL HIGH (ref 0.61–1.24)
GFR, Estimated: 17 mL/min — ABNORMAL LOW (ref >60)
Glucose, Bld: 155 mg/dL — ABNORMAL HIGH (ref 70–99)
Potassium: 3.2 mmol/L — ABNORMAL LOW (ref 3.5–5.1)
Sodium: 138 mmol/L (ref 135–145)
Total Bilirubin: 0.6 mg/dL (ref 0.3–1.2)
Total Protein: 6.2 g/dL — ABNORMAL LOW (ref 6.5–8.1)

## 2019-12-13 LAB — GLUCOSE, CAPILLARY
Glucose-Capillary: 157 mg/dL — ABNORMAL HIGH (ref 70–99)
Glucose-Capillary: 168 mg/dL — ABNORMAL HIGH (ref 70–99)
Glucose-Capillary: 189 mg/dL — ABNORMAL HIGH (ref 70–99)
Glucose-Capillary: 212 mg/dL — ABNORMAL HIGH (ref 70–99)
Glucose-Capillary: 269 mg/dL — ABNORMAL HIGH (ref 70–99)
Glucose-Capillary: 270 mg/dL — ABNORMAL HIGH (ref 70–99)

## 2019-12-13 LAB — CBC
HCT: 34.4 % — ABNORMAL LOW (ref 39.0–52.0)
Hemoglobin: 11 g/dL — ABNORMAL LOW (ref 13.0–17.0)
MCH: 27 pg (ref 26.0–34.0)
MCHC: 32 g/dL (ref 30.0–36.0)
MCV: 84.3 fL (ref 80.0–100.0)
Platelets: 350 10*3/uL (ref 150–400)
RBC: 4.08 MIL/uL — ABNORMAL LOW (ref 4.22–5.81)
RDW: 14.6 % (ref 11.5–15.5)
WBC: 11.8 10*3/uL — ABNORMAL HIGH (ref 4.0–10.5)
nRBC: 0 % (ref 0.0–0.2)

## 2019-12-13 LAB — COOXEMETRY PANEL
Carboxyhemoglobin: 1 % (ref 0.5–1.5)
Methemoglobin: 1.1 % (ref 0.0–1.5)
O2 Saturation: 70.7 %
Total hemoglobin: 11 g/dL — ABNORMAL LOW (ref 12.0–16.0)

## 2019-12-13 MED ORDER — POTASSIUM CHLORIDE CRYS ER 20 MEQ PO TBCR
40.0000 meq | EXTENDED_RELEASE_TABLET | Freq: Once | ORAL | Status: AC
  Administered 2019-12-13: 40 meq via ORAL
  Filled 2019-12-13: qty 2

## 2019-12-13 MED ORDER — ACETAMINOPHEN 325 MG PO TABS
650.0000 mg | ORAL_TABLET | Freq: Four times a day (QID) | ORAL | Status: DC | PRN
  Administered 2019-12-13 – 2019-12-14 (×2): 650 mg via ORAL
  Filled 2019-12-13 (×2): qty 2

## 2019-12-13 MED ORDER — TORSEMIDE 20 MG PO TABS
20.0000 mg | ORAL_TABLET | Freq: Every day | ORAL | Status: DC
  Administered 2019-12-13 – 2019-12-14 (×2): 20 mg via ORAL
  Filled 2019-12-13 (×2): qty 1

## 2019-12-13 MED ORDER — WARFARIN SODIUM 5 MG PO TABS
5.0000 mg | ORAL_TABLET | Freq: Once | ORAL | Status: AC
  Administered 2019-12-13: 5 mg via ORAL
  Filled 2019-12-13: qty 1

## 2019-12-13 NOTE — Progress Notes (Signed)
Physical Therapy Treatment Patient Details Name: Casey [name]. MRN: [id number] DOB: 1960/09/09 Today's Date: 12/13/2019    History of Present Illness Pt is 59 yo male admitted with anterior STEMI.  Underwent cardiac cath on 10/17 and had DES to LAD complicated by post-cath cardiogenic shock and acute on chronic kidney injury. He was transferred to [facility] for intervention.  He went to the cath lab for Swan-Ganz and then TEE in the OR on 10/19. Initially planned for impella however procedure cancelled on finding an LV thrombus on TEE. 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 Intubated 10/19-10/20.    PT Comments    Pt seated in recliner on arrival.  Pt continues to present with weakness and mild unsteadiness this session.  Progressed to short trial without AD pt utilized IV pole when fatigued.  At this time continue to recommend HHPT at d/c.      Follow Up Recommendations  Supervision/Assistance - 24 hour;Home health PT     Equipment Recommendations  Rolling walker with 5" wheels (may progress to no need for RW.)    Recommendations for Other Services       Precautions / Restrictions Precautions Precautions: Fall Restrictions Weight Bearing Restrictions: No    Mobility  Bed Mobility               General bed mobility comments: Pt seated in recliner on arrival this session.  Transfers Overall transfer level: Needs assistance Equipment used: None Transfers: Sit to/from Stand Sit to Stand: Supervision         General transfer comment: Cues for safety.  Ambulation/Gait Ambulation/Gait assistance: Min Gaffer (Feet): 150 Feet Assistive device: None;IV Pole Gait Pattern/deviations: Step-through pattern;Decreased stride length;Staggering right     General Gait Details: Pt performed with IV pole and short gt trial of no AD.  Pt with mild staggering to the R but able to self  correct.   Stairs Stairs: Yes Stairs assistance: Supervision Stair Management: One rail Right;Backwards;Forwards;Alternating pattern Number of Stairs: 7 General stair comments: Forward to ascend and backward to descend.   Wheelchair Mobility    Modified Rankin (Stroke Patients Only)       Balance Overall balance assessment: Needs assistance Sitting-balance support: No upper extremity supported Sitting balance-Leahy Scale: Good       Standing balance-Leahy Scale: Poor Standing balance comment: Relies on UE support and steadying assist by therapist                            Cognition Arousal/Alertness: Awake/alert Behavior During Therapy: WFL for tasks assessed/performed Overall Cognitive Status: Within Functional Limits for tasks assessed                                 General Comments: Pt appears tired but pleasant and agreeable to PT session.      Exercises General Exercises - Lower Extremity Ankle Circles/Pumps: Both Other Exercises Other Exercises: Cervical flexion and extension 1x10 reps. Other Exercises: Cervical rotation to R and L 1x10 reps. Other Exercises: Chin tucks 1x10 reps.    General Comments        Pertinent Vitals/Pain Pain Assessment: 0-10 Pain Score: 5  Pain Descriptors / Indicators: Discomfort;Tightness Pain Intervention(s): Monitored during session;Limited activity within patient's tolerance    Home Living  Prior Function            PT Goals (current goals can now be found in the care plan section) Acute Rehab PT Goals Patient Stated Goal: return home Potential to Achieve Goals: Good Progress towards PT goals: Progressing toward goals    Frequency    Min 3X/week      PT Plan Current plan remains appropriate    Co-evaluation              AM-PAC PT "6 Clicks" Mobility   Outcome Measure  Help needed turning from your back to your side while in a flat bed  without using bedrails?: A Little Help needed moving from lying on your back to sitting on the side of a flat bed without using bedrails?: A Little Help needed moving to and from a bed to a chair (including a wheelchair)?: A Little Help needed standing up from a chair using your arms (e.g., wheelchair or bedside chair)?: A Little Help needed to walk in hospital room?: A Little Help needed climbing 3-5 steps with a railing? : A Little 6 Click Score: 18    End of Session Equipment Utilized During Treatment: Gait belt Activity Tolerance: Patient tolerated treatment well Patient left: with call bell/phone within reach;with family/visitor present;in chair Nurse Communication: Mobility status (emptied bed side commode where he had a loose watery stool.) PT Visit Diagnosis: Unsteadiness on feet (R26.81);Muscle weakness (generalized) (M62.81)     Time: 6967-8938 PT Time Calculation (min) (ACUTE ONLY): 21 min  Charges:  $Gait Training: 8-22 mins                    Erasmo Leventhal , PTA Acute Rehabilitation Services Pager [phone] Office [phone]     Apolinar Bero Eli Hose 12/13/2019, 12:58 PM

## 2019-12-13 NOTE — Progress Notes (Signed)
[facility] Progress Note    Assessment/ Plan:   1. 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 Marval Regal), underlying disease=HTN and DM. Continue with tacrolimus and home prednisone dose. 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 Transplant ultrasound reviewed. Agree with lower tac dosing as compared to home (drug interaction with amio).  -Tacrolimus trough still pending  ??, sent 10/19 (goal tacrolimus trough 5-7). No changes today - have low suspicion of rejection 3. AGMA, likely related to AKI, if worsening repeat lactate levels. sodium bicarb 1300mg  TID- is at least stable 4. Shock, cardiogenic +/- septic shock, improved now off all pressors, only on milrinone 5. Ischemic cardiomyopathy, EF 20-25%. Diurese as needed. Not a candidate for impella given LV thrombus. Off lasix 6. Anterior STEMI, s/p LHC 10/17 7. LV thrombus: on hep gtt 8. Ileus, improving:  Noncon CT revealed ileus and nonobstructing 3 mm calculus in the lower pole of the right iliac fossa renal transplant. Says appetite is good, no nausea 9. Leukocytosis: now off abx 10.  Hypokalemia-  Will replete with 40 times one today    Subjective:   Feels well, no complaints.  Had H/A last night, better with tylenol. No acute events.  Moved to floor bed- crt down again - uop at least 900   Objective:   BP 120/84   Pulse 91   Temp 98.3 F (36.8 C) (Oral)   Resp (!) 30   Ht 6\' 1"  (1.854 m)   Wt 90.5 kg   SpO2 98%   BMI 26.32 kg/m    Intake/Output Summary (Last 24 hours) at 12/13/2019 0947 Last data filed at 12/13/2019 0800 Gross per 24 hour  Intake 1241.45 ml  Output 975 ml  Net 266.45 ml   Weight change: 0.597 kg  Physical Exam: Gen:nad, resting comfortably HEENT: dry mucosal membranes [id number], + systolic murmur, no r/g, rrr Resp:cta bl, bl chest expansion Abd: distended, nontender, soft Ext:no edema Neuro: awake, alert, following commands, moves all ext spontaneously  Imaging: No results found.  Labs: BMET Recent Labs  Lab 12/07/19 2217 12/08/19 0545 12/09/19 0407 12/10/19 0426 12/11/19 0403 12/12/19 0227 12/13/19 0336  NA 137 139 142 141 140 137 138  K 3.8 3.7 3.3* 3.6 3.5 3.5 3.2*  CL 103 104 106 107 106 106 109  CO2 20* 21* 22 21* 21* 19* 18*  GLUCOSE 217* 173* 161* 164* 173* 192* 155*  BUN 58* 58* 60* 55* 51* 42* 36*  CREATININE 3.83* 4.09* 4.65* 5.18* 5.00* 4.71* 3.93*  CALCIUM 9.6 9.7 9.2 9.6 9.7 9.6 9.0   CBC Recent Labs  Lab 12/10/19 0426 12/11/19 0403 12/12/19 0227 12/13/19 0336  WBC 10.9* 9.9 9.8 11.8*  HGB 11.8* 11.3* 11.8* 11.0*  HCT 35.9* 35.0* 36.4* 34.4*  MCV 84.1 83.7 83.1 84.3  PLT 309 305 324 350    Medications:    . aspirin  81 mg Oral Daily  . Chlorhexidine Gluconate Cloth  6 each Topical Daily  . clopidogrel  75 mg Oral Daily  . insulin aspart  0-9  Units Subcutaneous Q4H  . insulin detemir  10 Units Subcutaneous Daily  . isosorbide-hydrALAZINE  0.5 tablet Oral TID  . metoCLOPramide (REGLAN) injection  5 mg Intravenous TID  . pantoprazole  40 mg Oral Daily  . predniSONE  5 mg Oral Q breakfast  . rosuvastatin  40 mg Oral Daily  . sodium bicarbonate  1,300 mg Oral TID  . sodium chloride flush  10-40 mL Intracatheter Q12H  . sodium chloride flush  3 mL Intravenous Q12H  . tacrolimus  3 mg Oral BID  . Warfarin - Pharmacist Dosing Inpatient   Does not apply [facility] 12/13/2019, 9:47 AM

## 2019-12-13 NOTE — Progress Notes (Addendum)
Patient ID: Casey Mages., male   DOB: 25-Jun-1960, 59 y.o.   MRN: [id number]      Advanced Heart Failure Rounding Note  PCP-Cardiologist: No primary care provider on file.   Subjective:    Patient taken on OR 10/19 for [place] 5.5, unable to place due to sludge/early thrombus present at LV apex.  Subsequently returned [place], developed AF/RVR and started on amiodarone, converted to NSR.   Remains on milrinone 0.25 mcg + amio drip, in NSR. CVP 11. Co-ox 71%   SCr continues to trend down, 5.18>>5.00>>4.71>>3.93.   K 3.2  On coumadin + IV heparin bridge for LV thrombus.   He feels well today. No complaints. Ambulated yesterday and did ok.   [facility] Procedural Findings (on milrinone 0.25 mcg/kg/min): Hemodynamics (mmHg) RA mean 4 RV 20/5 PA 22/4, mean 14 PCWP mean 5 Oxygen saturations: PA 65% AO 98% Cardiac Output (Fick) 4.5  Cardiac Index (Fick) 2.06 Cardiac Output (Thermo) 4.5 Cardiac Index (Thermo) 2.06  Objective:   Weight Range: 90.5 kg Body mass index is 26.32 kg/m.   Vital Signs:   Temp:  [98.2 F (36.8 C)-98.5 F (36.9 C)] 98.3 F (36.8 C) (10/27 0736) Pulse Rate:  [92-100] 92 (10/27 0736) Resp:  [20-33] 21 (10/27 0736) BP: (117-147)/(74-92) 128/85 (10/27 0736) SpO2:  [95 %-100 %] 97 % (10/27 0736) Weight:  [90.5 kg] 90.5 kg (10/27 0324) Last BM Date: 12/12/19  Weight change: Filed Weights   12/11/19 0400 12/12/19 0350 12/13/19 0324  Weight: 89.9 kg 89.9 kg 90.5 kg    Intake/Output:   Intake/Output Summary (Last 24 hours) at 12/13/2019 0819 Last data filed at 12/13/2019 0400 Gross per 24 hour  Intake 1241.45 ml  Output 675 ml  Net 566.45 ml      Physical Exam    CVP 11 PHYSICAL EXAM: General:  Well appearing, laying in bed. No respiratory difficulty HEENT: normal Neck: supple. no JVD ~10 cm. + Rt IJ CVC Carotids 2+ bilat; no bruits. No lymphadenopathy or thyromegaly appreciated. Cor: PMI nondisplaced. Regular rate & rhythm. No rubs, gallops  or murmurs. Lungs: clear Abdomen: soft, nontender, nondistended. No hepatosplenomegaly. No bruits or masses. Good bowel sounds. Extremities: no cyanosis, clubbing, rash, edema Neuro: alert & oriented x 3, cranial nerves grossly intact. moves all 4 extremities w/o difficulty. Affect pleasant.  Telemetry   NSR 90s (personally reviewed)  Labs    CBC Recent Labs    12/12/19 0227 12/13/19 0336  WBC 9.8 11.8*  HGB 11.8* 11.0*  HCT 36.4* 34.4*  MCV 83.1 84.3  PLT 324 073   Basic Metabolic Panel Recent Labs    12/12/19 0227 12/13/19 0336  NA 137 138  K 3.5 3.2*  CL 106 109  CO2 19* 18*  GLUCOSE 192* 155*  BUN 42* 36*  CREATININE 4.71* 3.93*  CALCIUM 9.6 9.0   Liver Function Tests Recent Labs    12/12/19 0227 12/13/19 0336  AST 31 28  ALT 33 32  ALKPHOS 185* 156*  BILITOT 1.2 0.6  PROT 6.7 6.2*  ALBUMIN 2.3* 2.1*   No results for input(s): LIPASE, AMYLASE in the last 72 hours. Cardiac Enzymes No results for input(s): CKTOTAL, CKMB, CKMBINDEX, TROPONINI in the last 72 hours.  BNP: BNP (last 3 results) No results for input(s): BNP in the last 8760 hours.  ProBNP (last 3 results) No results for input(s): PROBNP in the last 8760 hours.   D-Dimer No results for input(s): DDIMER in the last 72 hours. Hemoglobin A1C  No results for input(s): HGBA1C in the last 72 hours. Fasting Lipid Panel No results for input(s): CHOL, HDL, LDLCALC, TRIG, CHOLHDL, LDLDIRECT in the last 72 hours. Thyroid Function Tests No results for input(s): TSH, T4TOTAL, T3FREE, THYROIDAB in the last 72 hours.  Invalid input(s): FREET3  Other results:   Imaging    No results found.   Medications:     Scheduled Medications: . aspirin  81 mg Oral Daily  . Chlorhexidine Gluconate Cloth  6 each Topical Daily  . clopidogrel  75 mg Oral Daily  . insulin aspart  0-9 Units Subcutaneous Q4H  . insulin detemir  10 Units Subcutaneous Daily  . isosorbide-hydrALAZINE  0.5 tablet Oral TID   . metoCLOPramide (REGLAN) injection  5 mg Intravenous TID  . pantoprazole  40 mg Oral Daily  . predniSONE  5 mg Oral Q breakfast  . rosuvastatin  40 mg Oral Daily  . sodium bicarbonate  1,300 mg Oral TID  . sodium chloride flush  10-40 mL Intracatheter Q12H  . sodium chloride flush  3 mL Intravenous Q12H  . tacrolimus  3 mg Oral BID  . Warfarin - Pharmacist Dosing Inpatient   Does not apply q1600    Infusions: . sodium chloride 10 mL/hr at 12/09/19 1900  . albumin human    . amiodarone 30 mg/hr (12/13/19 0400)  . heparin 1,700 Units/hr (12/13/19 0400)  . milrinone 0.25 mcg/kg/min (12/13/19 0400)    PRN Medications: acetaminophen, ALPRAZolam, phenol, sodium chloride flush  Assessment/Plan   1. Shock: Ischemic cardiomyopathy with EF 20-25% post-late presentation anterior MI.  Concern for cardiogenic shock but possible component of septic shock as well (gut source).  Excellent cardiac output by thermodilution and co-ox.  He is now off pressors, remains on milrinone 0.25. CVP 11.  Creatinine lower at 3.9 today. Co-ox 71% - Continue milrinone 0.25 for now while creatinine comes down to maintain cardiac output.   - diuretics still on hold. May need to restart soon if CVP continues to creep up  - No ARB/ARNi, SGLT2i nor dig w/ AKI  - Continue bidil 1/2 tab tid  - If stabilizes and creatinine comes down, consider PCI to LCx system down the road.  2. CAD: Delayed presentation anterior MI (CP began 10/15, PCI on 10/17).  He had DES to LAD, has residual severe up to 99% mid to distal RCA stenosis that is not revascularizable.  He has 95% mid-distal LCx stenosis and 80% OM3 stenosis that could be intervened upon.  He is on ASA + Plavix  + statin post-MI.  No chest pain.  - Plan to stop ASA after 30 days (will continue Plavix + coumadin)  - As above, will ideally have eventual PCI to LCx system.  3. AKI on CKD stage 3: Patient has history of renal transplant at [facility] in 2011.  Baseline  creatinine 1.4, peaked at 5.18 but starting to trend down, 3.9 today.  Suspect combination of contrast-induced nephropathy and cardiorenal syndrome in setting of cardiogenic shock.  CVP 11.  - Tacrolimus dose has been decreased, level pending (hopefully will be available soon).  - As above, no diuretics.  - Nephrology now following.  4. Elevated LFTs: Mild elevation, suspect shock liver with hypotension/cardiogenic shock. LFTs normalized. 5. DM2: SSI.  6. Ileus: Abdominal CT confirmed no obstruction.  - Improved, diet advanced.    7. Sludge LV apex/early thrombus: He is on heparin gtt. Eventually to warfarin.   8. ID: Afebrile.  PCT 61-->33.  ?Component of  septic shock from gut source given ileus/symptoms, treated w/ 7 day course of Zosyn. Afebrile and cultures negative.  9. Atrial fibrillation: Paroxysmal.  Now back in NSR on amiodarone gtt.  - Continue amiodarone gtt while on milrinone.  - Continue heparin gtt + warfarin given LV thrombus.    Lyda Jester, PA-C  12/13/2019 8:19 AM  Patient seen with PA, agree with the above note.   Stable today, creatinine trending down (3.93 today).  Co-ox 71%, CVP slowly rising up to 10 today.  Weight up a bit.  No dyspnea or chest pain.   General: NAD Neck: JVP 8-9 cm, no thyromegaly or thyroid nodule.  Lungs: Clear to auscultation bilaterally with normal respiratory effort. CV: Nondisplaced PMI.  Heart regular S1/S2, no S3/S4, no murmur.  No peripheral edema.   Abdomen: Soft, nontender, no hepatosplenomegaly, no distention.  Skin: Intact without lesions or rashes.  Neurologic: Alert and oriented x 3.  Psych: Normal affect. Extremities: No clubbing or cyanosis.  HEENT: Normal.   Decrease milrinone to 0.125.  Continue current Bidil.    Continue on IV amiodarone while on milrinone, eventually to po.  Now on heparin/warfarin overlap to therapeutic INR for LV thrombus and PAF.  He is in NSR.    Continue ASA 81 x 1 month + Plavix post-PCI.     Volume slowly trending up.  Will put him on torsemide 20 mg daily and follow response.   Loralie Champagne 12/13/2019 1:46 PM

## 2019-12-13 NOTE — Progress Notes (Signed)
CARDIAC REHAB PHASE I   PRE:  Rate/Rhythm: 97 SR    BP: sitting 117/80    SaO2: 98 RA  MODE:  Ambulation: 400 ft   POST:  Rate/Rhythm: 112 ST    BP: sitting 124/78     SaO2: 98 RA  Pt weak but able to walk with RW, slow and steady. SOB with distance. Sat and rested once in room at sink then stood and brushed his teeth. To bed for nap, VSS. Encouraged recliner later and more ambulation.  [id number]   [place], ACSM 12/13/2019 11:42 AM

## 2019-12-13 NOTE — Progress Notes (Signed)
Mobility Specialist - Progress Note   12/13/19 1535  Mobility  Activity Ambulated in hall  Level of Assistance Modified independent, requires aide device or extra time  Assistive Device  (IV Stand)  Distance Ambulated (ft) 400 ft  Mobility Response Tolerated well  Mobility performed by Mobility specialist  $Mobility charge 1 Mobility    Pre-mobility: 97 HR, 97% SpO2 During mobility: 107 HR Post-mobility: 94 HR, 94% SpO2  Pt asx throughout ambulation. He requires min assist for bed mobility.   Pricilla Handler Mobility Specialist Mobility Specialist Phone: [phone]

## 2019-12-13 NOTE — Progress Notes (Signed)
ANTICOAGULATION CONSULT NOTE  Pharmacy Consult for heparin>>warfarin Indication: apical thrombus  No Known Allergies  Patient Measurements: Height: 6\' 1"  (185.4 cm) Weight: 90.5 kg (199 lb 8.3 oz) IBW/kg (Calculated) : 79.9 Heparin Dosing Weight: 92 kg  Vital Signs: Temp: 97.8 F (36.6 C) (10/27 1105) Temp Source: Oral (10/27 1105) BP: 118/69 (10/27 1105) Pulse Rate: 103 (10/27 1105)  Labs: Recent Labs    12/11/19 0403 12/11/19 0403 12/12/19 0227 12/13/19 0336 12/13/19 0529  HGB 11.3*   < > 11.8* 11.0*  --   HCT 35.0*  --  36.4* 34.4*  --   PLT 305  --  324 350  --   LABPROT  --   --   --  19.0*  --   INR  --   --   --  1.7*  --   HEPARINUNFRC 0.46   < > 0.32 0.19* 0.21*  CREATININE 5.00*  --  4.71* 3.93*  --    < > = values in this interval not displayed.    Estimated Creatinine Clearance: 22.9 mL/min (A) (by C-G formula based on SCr of 3.93 mg/dL (H)).   Medical History: Past Medical History:  Diagnosis Date  . Chronic kidney disease 04/2009   Kidney Transplant  . Diabetes mellitus   . GERD (gastroesophageal reflux disease)    as needed reflux  . Hypertension   . 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     Assessment: 59 year old male presented with STEMI s/p PCI to mid LAD. Pt noted to have apical smoke on ECHO concerning for early thrombus, confirmed on TEE. Pharmacy asked to continue heparin.   Heparin level below goal this morning at 0.2, H/H stable, pltc wnl. No bleeding issues noted. Baseline INR 1.3 on 10/19 up to 1.7 after warfarin given last night.   Goal of Therapy:  INR goal 2-3 Heparin level 0.3-0.7 units/ml Monitor platelets by anticoagulation protocol: Yes   Plan:  -Increase heparin to 1900 units/h -Daily heparin level and CBC -Warfarin 5mg  tonight -Daily INR  Erin Hearing PharmD., BCPS Clinical Pharmacist 12/13/2019 11:44 AM

## 2019-12-13 NOTE — TOC Initial Note (Signed)
Transition of Care (TOC) - Initial/Assessment Note    Patient Details  Name: Casey [name]. MRN: [id number] Date of Birth: 23-Oct-1960  Transition of Care [facility]) CM/SW Contact:    Carles Collet, RN Phone Number: 12/13/2019, 11:47 AM  Clinical Narrative:           Damaris Schooner w patient at bedside. Discussed DC plans. He is interested in [facility] PT as rec by PT eval. Discussed [place] providers and his insurance coverage. He is agreeable to [facility], referral placed. Will need HH PT order. Provided with and explained use of Brilinta 30 day free and copay reduction cards. Patient verbalized understanding. Coupon placed in teal backpack. No other CM needs at this time.         Expected Discharge Plan: [place] Barriers to Discharge: Continued Medical Work up   Patient Goals and CMS Choice Patient states their goals for this hospitalization and ongoing recovery are:: to go home      Expected Discharge Plan and Services Expected Discharge Plan: [place]   Discharge Planning Services: CM Consult Post Acute Care Choice: Home Health                             HH Arranged: PT [facility] Agency: [facility]: 12/13/19 Time HH Agency Contacted: 85 Representative spoke with at [place]: Tommi Rumps  Prior Living Arrangements/Services   Lives with:: Spouse   Do you feel safe going back to the place where you live?: Yes               Activities of Daily Living Home Assistive Devices/Equipment: None ADL Screening (condition at time of admission) Patient's cognitive ability adequate to safely complete daily activities?: Yes Is the patient deaf or have difficulty hearing?: No Does the patient have difficulty seeing, even when wearing glasses/contacts?: No Does the patient have difficulty concentrating, remembering, or making decisions?: No Patient able to express need for assistance with ADLs?: No Does the patient have  difficulty dressing or bathing?: No Independently performs ADLs?: Yes (appropriate for developmental age) Does the patient have difficulty walking or climbing stairs?: No Weakness of Legs: None Weakness of Arms/Hands: None  Permission Sought/Granted                  Emotional Assessment              Admission diagnosis:  Cardiogenic shock ([place]) [R57.0] Patient Active Problem List   Diagnosis Date Noted   Cardiogenic shock ([place]) 12/05/2019   Acute kidney injury superimposed on chronic kidney disease ([place])    HTN (hypertension) 35/45/6256   Acute systolic heart failure (HCC)    Acute ST elevation myocardial infarction (STEMI) due to occlusion of left anterior descending (LAD) coronary artery ([place]) 12/03/2019   Type 2 diabetes mellitus with hyperlipidemia ([place]) 12/03/2019   Hyperlipidemia 12/03/2019   History of renal transplant 12/03/2019   History of ST elevation myocardial infarction (STEMI) 12/03/2019   End stage renal disease ([place]) 03/03/2011   Aneurysm of unspecified site ([place]) 38/93/7342   Other complications due to renal dialysis device, implant, and graft 02/13/2011   ESRD (end stage renal disease) ([place]) 02/13/2011   PCP:  Dion Body, MD Pharmacy:   [id number] - [address], [place] [address] Phone: [phone] Fax: [phone]     Social Determinants of Health (SDOH)  Interventions    Readmission Risk Interventions No flowsheet data found.

## 2019-12-13 NOTE — Progress Notes (Signed)
ANTICOAGULATION CONSULT NOTE  Pharmacy Consult for heparin>>warfarin Indication: apical thrombus  No Known Allergies  Patient Measurements: Height: 6\' 1"  (185.4 cm) Weight: 90.5 kg (199 lb 8.3 oz) IBW/kg (Calculated) : 79.9 Heparin Dosing Weight: 92 kg  Vital Signs: Temp: 98.9 F (37.2 C) (10/27 2026) Temp Source: Oral (10/27 2026) BP: 142/90 (10/27 2026) Pulse Rate: 91 (10/27 2026)  Labs: Recent Labs    12/11/19 0403 12/11/19 0403 12/12/19 0227 12/12/19 0227 12/13/19 0336 12/13/19 0529 12/13/19 2013  HGB 11.3*   < > 11.8*  --  11.0*  --   --   HCT 35.0*  --  36.4*  --  34.4*  --   --   PLT 305  --  324  --  350  --   --   LABPROT  --   --   --   --  19.0*  --   --   INR  --   --   --   --  1.7*  --   --   HEPARINUNFRC 0.46   < > 0.32   < > 0.19* 0.21* 0.47  CREATININE 5.00*  --  4.71*  --  3.93*  --   --    < > = values in this interval not displayed.    Estimated Creatinine Clearance: 22.9 mL/min (A) (by C-G formula based on SCr of 3.93 mg/dL (H)).   Medical History: Past Medical History:  Diagnosis Date  . Chronic kidney disease 04/2009   Kidney Transplant  . Diabetes mellitus   . GERD (gastroesophageal reflux disease)    as needed reflux  . Hypertension   . 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     Assessment: 59 year old male presented with STEMI s/p PCI to mid LAD. Pt noted to have apical smoke on ECHO concerning for early thrombus, confirmed on TEE. Pharmacy asked to continue heparin.   Heparin level tonight is within goal range.  No overt bleeding or complications noted.  Goal of Therapy:  INR goal 2-3 Heparin level 0.3-0.7 units/ml Monitor platelets by anticoagulation protocol: Yes   Plan:  -Continue IV heparin at 1900 units/h -Daily heparin level and CBC -Daily INR  [place] Crane, Roylene Reason, [facility] Clinical Pharmacist  12/13/2019 10:03 PM   [facility] pharmacy phone numbers are listed on [email or web address]

## 2019-12-14 LAB — CBC
HCT: 32.4 % — ABNORMAL LOW (ref 39.0–52.0)
Hemoglobin: 10.6 g/dL — ABNORMAL LOW (ref 13.0–17.0)
MCH: 27.7 pg (ref 26.0–34.0)
MCHC: 32.7 g/dL (ref 30.0–36.0)
MCV: 84.6 fL (ref 80.0–100.0)
Platelets: 368 10*3/uL (ref 150–400)
RBC: 3.83 MIL/uL — ABNORMAL LOW (ref 4.22–5.81)
RDW: 14.6 % (ref 11.5–15.5)
WBC: 11.4 10*3/uL — ABNORMAL HIGH (ref 4.0–10.5)
nRBC: 0 % (ref 0.0–0.2)

## 2019-12-14 LAB — GLUCOSE, CAPILLARY
Glucose-Capillary: 138 mg/dL — ABNORMAL HIGH (ref 70–99)
Glucose-Capillary: 155 mg/dL — ABNORMAL HIGH (ref 70–99)
Glucose-Capillary: 168 mg/dL — ABNORMAL HIGH (ref 70–99)
Glucose-Capillary: 218 mg/dL — ABNORMAL HIGH (ref 70–99)
Glucose-Capillary: 266 mg/dL — ABNORMAL HIGH (ref 70–99)
Glucose-Capillary: 269 mg/dL — ABNORMAL HIGH (ref 70–99)

## 2019-12-14 LAB — RENAL FUNCTION PANEL
Albumin: 2.2 g/dL — ABNORMAL LOW (ref 3.5–5.0)
Anion gap: 12 (ref 5–15)
BUN: 34 mg/dL — ABNORMAL HIGH (ref 6–20)
CO2: 19 mmol/L — ABNORMAL LOW (ref 22–32)
Calcium: 9.4 mg/dL (ref 8.9–10.3)
Chloride: 108 mmol/L (ref 98–111)
Creatinine, Ser: 3.78 mg/dL — ABNORMAL HIGH (ref 0.61–1.24)
GFR, Estimated: 18 mL/min — ABNORMAL LOW (ref >60)
Glucose, Bld: 141 mg/dL — ABNORMAL HIGH (ref 70–99)
Phosphorus: 3.3 mg/dL (ref 2.5–4.6)
Potassium: 3.8 mmol/L (ref 3.5–5.1)
Sodium: 139 mmol/L (ref 135–145)

## 2019-12-14 LAB — COOXEMETRY PANEL
Carboxyhemoglobin: 1 % (ref 0.5–1.5)
Methemoglobin: 1.2 % (ref 0.0–1.5)
O2 Saturation: 68.9 %
Total hemoglobin: 10.7 g/dL — ABNORMAL LOW (ref 12.0–16.0)

## 2019-12-14 LAB — HEPARIN LEVEL (UNFRACTIONATED): Heparin Unfractionated: 0.35 IU/mL (ref 0.30–0.70)

## 2019-12-14 LAB — PROTIME-INR
INR: 3.6 — ABNORMAL HIGH (ref 0.8–1.2)
Prothrombin Time: 35 seconds — ABNORMAL HIGH (ref 11.4–15.2)

## 2019-12-14 LAB — URIC ACID: Uric Acid, Serum: 4.6 mg/dL (ref 3.7–8.6)

## 2019-12-14 MED ORDER — COLCHICINE 0.6 MG PO TABS: 0.6000 mg | ORAL_TABLET | Freq: Two times a day (BID) | ORAL | Status: DC | PRN

## 2019-12-14 MED ORDER — METHYLPREDNISOLONE 4 MG PO TBPK
8.0000 mg | ORAL_TABLET | Freq: Once | ORAL | Status: DC
  Filled 2019-12-14 (×2): qty 21

## 2019-12-14 MED ORDER — METHYLPREDNISOLONE 4 MG PO TBPK
4.0000 mg | ORAL_TABLET | ORAL | Status: AC
  Administered 2019-12-14: 4 mg via ORAL

## 2019-12-14 MED ORDER — METHYLPREDNISOLONE 4 MG PO TBPK
8.0000 mg | ORAL_TABLET | Freq: Every evening | ORAL | Status: AC
  Administered 2019-12-14: 8 mg via ORAL

## 2019-12-14 MED ORDER — METHYLPREDNISOLONE 4 MG PO TBPK: 4.0000 mg | ORAL_TABLET | Freq: Four times a day (QID) | ORAL | Status: DC

## 2019-12-14 MED ORDER — COLCHICINE 0.3 MG HALF TABLET
0.3000 mg | ORAL_TABLET | Freq: Once | ORAL | Status: AC
  Administered 2019-12-14: 0.3 mg via ORAL
  Filled 2019-12-14: qty 1

## 2019-12-14 MED ORDER — METHYLPREDNISOLONE 4 MG PO TBPK: 8.0000 mg | ORAL_TABLET | Freq: Every evening | ORAL | Status: DC

## 2019-12-14 MED ORDER — METHYLPREDNISOLONE 4 MG PO TBPK
4.0000 mg | ORAL_TABLET | Freq: Three times a day (TID) | ORAL | Status: DC
  Administered 2019-12-15: 4 mg via ORAL

## 2019-12-14 MED ORDER — AMIODARONE HCL 200 MG PO TABS
200.0000 mg | ORAL_TABLET | Freq: Two times a day (BID) | ORAL | Status: DC
  Administered 2019-12-14 – 2019-12-15 (×3): 200 mg via ORAL
  Filled 2019-12-14 (×3): qty 1

## 2019-12-14 MED ORDER — METHYLPREDNISOLONE 4 MG PO TBPK: 4.0000 mg | ORAL_TABLET | ORAL | Status: DC

## 2019-12-14 NOTE — Progress Notes (Signed)
ANTICOAGULATION CONSULT NOTE  Pharmacy Consult for heparin>>warfarin Indication: apical thrombus  No Known Allergies  Patient Measurements: Height: 6\' 1"  (185.4 cm) Weight: 90.4 kg (199 lb 4.8 oz) IBW/kg (Calculated) : 79.9 Heparin Dosing Weight: 92 kg  Vital Signs: Temp: 98.2 F (36.8 C) (10/28 1118) Temp Source: Oral (10/28 1118) BP: 106/68 (10/28 1118) Pulse Rate: 94 (10/28 1118)  Labs: Recent Labs    12/12/19 0227 12/12/19 0227 12/13/19 0336 12/13/19 0336 12/13/19 0529 12/13/19 2013 12/14/19 0500 12/14/19 0509 12/14/19 0704  HGB 11.8*   < > 11.0*  --   --   --  10.6*  --   --   HCT 36.4*  --  34.4*  --   --   --  32.4*  --   --   PLT 324  --  350  --   --   --  368  --   --   LABPROT  --   --  19.0*  --   --   --   --   --  35.0*  INR  --   --  1.7*  --   --   --   --   --  3.6*  HEPARINUNFRC 0.32   < > 0.19*   < > 0.21* 0.47  --   --  0.35  CREATININE 4.71*  --  3.93*  --   --   --   --  3.78*  --    < > = values in this interval not displayed.    Estimated Creatinine Clearance: 23.8 mL/min (A) (by C-G formula based on SCr of 3.78 mg/dL (H)).   Medical History: Past Medical History:  Diagnosis Date  . Chronic kidney disease 04/2009   Kidney Transplant  . Diabetes mellitus   . GERD (gastroesophageal reflux disease)    as needed reflux  . Hypertension   . 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     Assessment: 59 year old male presented with STEMI s/p PCI to mid LAD. Pt noted to have apical smoke on ECHO concerning for early thrombus, confirmed on TEE. Pharmacy asked to continue heparin and warfarin  INR up to 3.6 after 2 doses of warfarin, will stop heparin. Will hold warfarin tonight. Hgb stable overall to 10.6, plt wnl. No bleeding issues noted.   Goal of Therapy:  INR goal 2-3 Heparin level 0.3-0.7 units/ml Monitor platelets by anticoagulation protocol: Yes   Plan:  Stop heparin Hold warfarin tonight Daily INR  for now  Erin Hearing PharmD., BCPS Clinical Pharmacist 12/14/2019 11:28 AM

## 2019-12-14 NOTE — Progress Notes (Addendum)
Patient ID: Jenelle Mages., male   DOB: 11-Jun-1960, 59 y.o.   MRN: [id number]      Advanced Heart Failure Rounding Note  PCP-Cardiologist: No primary care provider on file.   Subjective:    Patient taken on OR 10/19 for Impella 5.5, unable to place due to sludge/early thrombus present at LV apex.  Subsequently returned [place], developed AF/RVR and started on amiodarone, converted to NSR.   Remains on milrinone 0.125 mcg + amio drip, in NSR.  SCr continues to trend down, 5.18>>5.00>>4.71>>3.93>>3.8  On heparin drip. INR 3.6   Complaining of L great toe pain. Denies SOB.    [facility] Procedural Findings (on milrinone 0.25 mcg/kg/min): Hemodynamics (mmHg) RA mean 4 RV 20/5 PA 22/4, mean 14 PCWP mean 5 Oxygen saturations: PA 65% AO 98% Cardiac Output (Fick) 4.5  Cardiac Index (Fick) 2.06 Cardiac Output (Thermo) 4.5 Cardiac Index (Thermo) 2.06  Objective:   Weight Range: 90.4 kg Body mass index is 26.29 kg/m.   Vital Signs:   Temp:  [97.8 F (36.6 C)-98.9 F (37.2 C)] 98.1 F (36.7 C) (10/28 0728) Pulse Rate:  [91-103] 93 (10/28 0728) Resp:  [18-22] 21 (10/28 0728) BP: (115-142)/(69-92) 136/90 (10/28 0728) SpO2:  [95 %-100 %] 97 % (10/28 0728) Weight:  [90.4 kg] 90.4 kg (10/28 0409) Last BM Date: 12/12/19  Weight change: Filed Weights   12/12/19 0350 12/13/19 0324 12/14/19 0409  Weight: 89.9 kg 90.5 kg 90.4 kg    Intake/Output:   Intake/Output Summary (Last 24 hours) at 12/14/2019 1022 Last data filed at 12/14/2019 0829 Gross per 24 hour  Intake 1747.34 ml  Output 1450 ml  Net 297.34 ml      Physical Exam   CVP 1-2  General:  No resp difficulty. Sitting in the chair.  HEENT: normal Neck: supple. no JVD. Carotids 2+ bilat; no bruits. No lymphadenopathy or thryomegaly appreciated. RIJ  Cor: PMI nondisplaced. Regular rate & rhythm. No rubs, gallops or murmurs. Lungs: clear Abdomen: soft, nontender, nondistended. No hepatosplenomegaly. No bruits or masses.  Good bowel sounds. Extremities: no cyanosis, clubbing, rash, edema. Left great toe tender. No erythema  Neuro: alert & orientedx3, cranial nerves grossly intact. moves all 4 extremities w/o difficulty. Affect pleasant   Telemetry   NSR-ST 90-100s   Labs    CBC Recent Labs    12/13/19 0336 12/14/19 0500  WBC 11.8* 11.4*  HGB 11.0* 10.6*  HCT 34.4* 32.4*  MCV 84.3 84.6  PLT 350 825   Basic Metabolic Panel Recent Labs    12/13/19 0336 12/14/19 0509  NA 138 139  K 3.2* 3.8  CL 109 108  CO2 18* 19*  GLUCOSE 155* 141*  BUN 36* 34*  CREATININE 3.93* 3.78*  CALCIUM 9.0 9.4  PHOS  --  3.3   Liver Function Tests Recent Labs    12/12/19 0227 12/12/19 0227 12/13/19 0336 12/14/19 0509  AST 31  --  28  --   ALT 33  --  32  --   ALKPHOS 185*  --  156*  --   BILITOT 1.2  --  0.6  --   PROT 6.7  --  6.2*  --   ALBUMIN 2.3*   < > 2.1* 2.2*   < > = values in this interval not displayed.   No results for input(s): LIPASE, AMYLASE in the last 72 hours. Cardiac Enzymes No results for input(s): CKTOTAL, CKMB, CKMBINDEX, TROPONINI in the last 72 hours.  BNP: BNP (last 3 results)  No results for input(s): BNP in the last 8760 hours.  ProBNP (last 3 results) No results for input(s): PROBNP in the last 8760 hours.   D-Dimer No results for input(s): DDIMER in the last 72 hours. Hemoglobin A1C No results for input(s): HGBA1C in the last 72 hours. Fasting Lipid Panel No results for input(s): CHOL, HDL, LDLCALC, TRIG, CHOLHDL, LDLDIRECT in the last 72 hours. Thyroid Function Tests No results for input(s): TSH, T4TOTAL, T3FREE, THYROIDAB in the last 72 hours.  Invalid input(s): FREET3  Other results:   Imaging    No results found.   Medications:     Scheduled Medications: . aspirin  81 mg Oral Daily  . Chlorhexidine Gluconate Cloth  6 each Topical Daily  . clopidogrel  75 mg Oral Daily  . insulin aspart  0-9 Units Subcutaneous Q4H  . insulin detemir  10  Units Subcutaneous Daily  . isosorbide-hydrALAZINE  0.5 tablet Oral TID  . methylPREDNISolone  4 mg Oral PC lunch  . methylPREDNISolone  4 mg Oral PC supper  . [START ON 12/15/2019] methylPREDNISolone  4 mg Oral 3 x daily with food  . [START ON 12/16/2019] methylPREDNISolone  4 mg Oral 4X daily taper  . methylPREDNISolone  8 mg Oral Once  . methylPREDNISolone  8 mg Oral Nightly  . [START ON 12/15/2019] methylPREDNISolone  8 mg Oral Nightly  . metoCLOPramide (REGLAN) injection  5 mg Intravenous TID  . pantoprazole  40 mg Oral Daily  . rosuvastatin  40 mg Oral Daily  . sodium bicarbonate  1,300 mg Oral TID  . sodium chloride flush  10-40 mL Intracatheter Q12H  . sodium chloride flush  3 mL Intravenous Q12H  . tacrolimus  3 mg Oral BID  . torsemide  20 mg Oral Daily  . Warfarin - Pharmacist Dosing Inpatient   Does not apply q1600    Infusions: . sodium chloride 10 mL/hr at 12/09/19 1900  . albumin human    . amiodarone 30 mg/hr (12/14/19 0834)  . heparin 1,900 Units/hr (12/14/19 1610)  . milrinone 0.125 mcg/kg/min (12/14/19 0833)    PRN Medications: acetaminophen, ALPRAZolam, phenol, sodium chloride flush  Assessment/Plan   1. Shock: Ischemic cardiomyopathy with EF 20-25% post-late presentation anterior MI.  Concern for cardiogenic shock but possible component of septic shock as well (gut source).  Excellent cardiac output by thermodilution and co-ox.  He is now off pressors, remains on milrinone 0.25.  - Creatinine lower at 3.8 today. Co-ox 71%. CVP 1-2.Hold diuretics.  -Stop milrinone.  - No ARB/ARNi, SGLT2i nor dig w/ AKI  - Continue bidil 1/2 tab tid  - If stabilizes and creatinine comes down, consider PCI to LCx system down the road.  2. CAD: Delayed presentation anterior MI (CP began 10/15, PCI on 10/17).  He had DES to LAD, has residual severe up to 99% mid to distal RCA stenosis that is not revascularizable.  He has 95% mid-distal LCx stenosis and 80% OM3 stenosis that  could be intervened upon.  He is on ASA + Plavix  + statin post-MI.  No chest pain.  - Plan to stop ASA after 30 days (will continue Plavix + coumadin)  - As above, will ideally have eventual PCI to LCx system.  3. AKI on CKD stage 3: Patient has history of renal transplant at [facility] in 2011.  Baseline creatinine 1.4, peaked at 5.18 but starting to trend down, 3.8  today.  Suspect combination of contrast-induced nephropathy and cardiorenal syndrome in setting of cardiogenic  shock.  - Tacrolimus dose has been decreased, level send 10/19 appears to have been lost, resending per nephrology.   - As above, no diuretics.  - Nephrology now following.  4. Elevated LFTs: Mild elevation, suspect shock liver with hypotension/cardiogenic shock. LFTs normalized. 5. DM2: SSI.  6. Ileus: Abdominal CT confirmed no obstruction.  - Improved, diet advanced.    7. Sludge LV apex/early thrombus: He is on heparin gtt. Eventually to warfarin.   8. ID: Afebrile.  PCT 61-->33.  ?Component of septic shock from gut source given ileus/symptoms, treated w/ 7 day course of Zosyn. Afebrile and cultures negative.  9. Atrial fibrillation: Paroxysmal.  Maintaining NSR.  Stop amio drip. Start amio 200 mg twice a day . INR therapeutic. Stop heparin drip. Continue coumadin. 10. L great toe pain Suspect gout. Check uric acid level.    Darrick Grinder, NP-C  12/14/2019 10:22 AM  Patient seen with NP, agree with the above note.   Only complaint is pain in his left great toe.  Uric acid not elevated.  No chest pain or dyspnea.   Co-ox 71% today, creatinine continues to trend down, CVP 2.  INR > 3.  He remains in NSR.   General: NAD Neck: No JVD, no thyromegaly or thyroid nodule.  Lungs: Clear to auscultation bilaterally with normal respiratory effort. CV: Nondisplaced PMI.  Heart regular S1/S2, no S3/S4, no murmur.  No peripheral edema.   Abdomen: Soft, nontender, no hepatosplenomegaly, no distention.  Skin: Intact without lesions  or rashes.  Neurologic: Alert and oriented x 3.  Psych: Normal affect. Extremities: No clubbing or cyanosis.  HEENT: Normal.   Stop heparin gtt today.  Will stop amiodarone gtt and start po amiodarone.   Stop milrinone today.  No diuretic.  Continue current Bidil.  If he does well off milrinone and creatinine continues to trend down, will send home soon.   Loralie Champagne 12/14/2019 2:24 PM

## 2019-12-14 NOTE — Progress Notes (Signed)
Mobility Specialist - Progress Note   12/14/19 1356  Mobility  Activity Transferred:  Chair to bed  Level of Assistance Standby assist, set-up cues, supervision of patient - no hands on  Assistive Device None  Mobility Response Tolerated well  Mobility performed by Mobility specialist  $Mobility charge 1 Mobility   Pt refused ambulation due to gout flare-up in his L foot. He requested assistance in transferring into bed. HR in 90s throughout.   Pricilla Handler Mobility Specialist Mobility Specialist Phone: [phone]

## 2019-12-14 NOTE — Progress Notes (Signed)
[facility] Progress Note    Assessment/ Plan:   1. Acute kidney Injury secondary to cardiogenic shock and diminished perfusion from hypotension (ATN picture) with possible contrast induced injury - crt trended down now 4 days in a row-  More recent U/A less RBC-  Stable protein-  I think low chance of actual rejection  2. 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 Marval Regal), underlying disease=HTN and DM. Continue with tacrolimus and home prednisone dose. 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 Transplant ultrasound reviewed. Agree with lower tac dosing as compared to home (drug interaction with amio).  -Tacrolimus trough still pending  ??, sent 10/19 (goal tacrolimus trough 5-7). No changes today - have low suspicion of rejection.  Tac level must have been lost, will resend 3. AGMA, likely related to AKI, if worsening repeat lactate levels. sodium bicarb 1300mg  TID- is at least stable 4. Shock, cardiogenic +/- septic shock, improved now off all pressors, only on milrinone 5. Ischemic cardiomyopathy, EF 20-25%. Diurese as needed. Not a candidate for impella given LV thrombus. Off lasix- milrinone per cards 6. Anterior STEMI, s/p LHC 10/17 7. LV thrombus: on hep gtt- converting to coumadin 8. Ileus, improving:  Noncon CT revealed ileus and nonobstructing 3 mm calculus in the lower pole of the right iliac fossa renal transplant. Says appetite is good, no nausea 9. Leukocytosis: now off abx 10.  Hypokalemia-  Better 11. Probably gout -  Pt has had before-  Send uric acid and attempt to treat with colchicine  12. Anemia- hgb falling - need to watch    Subjective:   Feels well for most part, has what he thinks is gout in his left toe.   Moved to floor bed- crt down again some - uop 1750 now on torsemide   Objective:   BP 136/90 (BP Location: Right Arm)   Pulse 93   Temp 98.1 F (36.7 C) (Oral)   Resp (!) 21   Ht 6\' 1"  (1.854 m)    Wt 90.4 kg   SpO2 97%   BMI 26.29 kg/m   Intake/Output Summary (Last 24 hours) at 12/14/2019 0849 Last data filed at 12/14/2019 0829 Gross per 24 hour  Intake 1747.34 ml  Output 1450 ml  Net 297.34 ml   Weight change: -0.098 kg  Physical Exam: Gen:nad, resting comfortably HEENT: dry mucosal membranes WRU:[id number], + systolic murmur, no r/g, rrr Resp:cta bl, bl chest expansion Abd: distended, nontender, soft Ext:no edema Neuro: awake, alert, following commands, moves all ext spontaneously  Imaging: No results found.  Labs: BMET Recent Labs  Lab 12/08/19 0545 12/09/19 0407 12/10/19 0426 12/11/19 0403 12/12/19 0227 12/13/19 0336 12/14/19 0509  NA 139 142 141 140 137 138 139  K 3.7 3.3* 3.6 3.5 3.5 3.2* 3.8  CL 104 106 107 106 106 109 108  CO2 21* 22 21* 21* 19* 18* 19*  GLUCOSE 173* 161* 164* 173* 192* 155* 141*  BUN 58* 60* 55* 51* 42* 36* 34*  CREATININE 4.09* 4.65* 5.18* 5.00* 4.71* 3.93* 3.78*  CALCIUM 9.7 9.2 9.6 9.7 9.6 9.0 9.4  PHOS  --   --   --   --   --   --  3.3   CBC Recent Labs  Lab 12/11/19 0403 12/12/19 0227 12/13/19 0336 12/14/19 0500  WBC 9.9 9.8 11.8* 11.4*  HGB 11.3* 11.8* 11.0* 10.6*  HCT 35.0* 36.4* 34.4* 32.4*  MCV 83.7 83.1 84.3 84.6  PLT 305 324 350  368    Medications:    . aspirin  81 mg Oral Daily  . Chlorhexidine Gluconate Cloth  6 each Topical Daily  . clopidogrel  75 mg Oral Daily  . insulin aspart  0-9 Units Subcutaneous Q4H  . insulin detemir  10 Units Subcutaneous Daily  . isosorbide-hydrALAZINE  0.5 tablet Oral TID  . metoCLOPramide (REGLAN) injection  5 mg Intravenous TID  . pantoprazole  40 mg Oral Daily  . predniSONE  5 mg Oral Q breakfast  . rosuvastatin  40 mg Oral Daily  . sodium bicarbonate  1,300 mg Oral TID  . sodium chloride flush  10-40 mL Intracatheter Q12H  . sodium chloride flush  3 mL Intravenous Q12H  . tacrolimus  3 mg Oral BID  . torsemide  20 mg Oral Daily  . Warfarin - Pharmacist Dosing  Inpatient   Does not apply [facility] 12/14/2019, 8:49 AM

## 2019-12-14 NOTE — Progress Notes (Signed)
Pt declined ambulating due to foot pain. Began discussing ed with pt and wife. Discussed MI, stent, restrictions, HF management, low sodium diet, NTG and CRPII. Pt and wife voice understanding but are overwhelmed by diet since he has DM, coumadin, low sodium, HH, and renal. Suggest they see an RD when able. Will refer to [facility] CRPII in case he does not have stent. He would probably benefit from RW for home since having gout. [place], ACSM 3:37 PM 12/14/2019

## 2019-12-15 LAB — RENAL FUNCTION PANEL
Albumin: 2.4 g/dL — ABNORMAL LOW (ref 3.5–5.0)
Anion gap: 11 (ref 5–15)
BUN: 35 mg/dL — ABNORMAL HIGH (ref 6–20)
CO2: 20 mmol/L — ABNORMAL LOW (ref 22–32)
Calcium: 9.5 mg/dL (ref 8.9–10.3)
Chloride: 105 mmol/L (ref 98–111)
Creatinine, Ser: 3.38 mg/dL — ABNORMAL HIGH (ref 0.61–1.24)
GFR, Estimated: 20 mL/min — ABNORMAL LOW (ref >60)
Glucose, Bld: 198 mg/dL — ABNORMAL HIGH (ref 70–99)
Phosphorus: 3.8 mg/dL (ref 2.5–4.6)
Potassium: 4.3 mmol/L (ref 3.5–5.1)
Sodium: 136 mmol/L (ref 135–145)

## 2019-12-15 LAB — GLUCOSE, CAPILLARY
Glucose-Capillary: 185 mg/dL — ABNORMAL HIGH (ref 70–99)
Glucose-Capillary: 183 mg/dL — ABNORMAL HIGH (ref 70–99)
Glucose-Capillary: 246 mg/dL — ABNORMAL HIGH (ref 70–99)

## 2019-12-15 LAB — HEPATIC FUNCTION PANEL
ALT: 43 U/L (ref 0–44)
AST: 30 U/L (ref 15–41)
Albumin: 2.4 g/dL — ABNORMAL LOW (ref 3.5–5.0)
Alkaline Phosphatase: 151 U/L — ABNORMAL HIGH (ref 38–126)
Bilirubin, Direct: <0.1 mg/dL (ref 0.0–0.2)
Total Bilirubin: 0.6 mg/dL (ref 0.3–1.2)
Total Protein: 6.7 g/dL (ref 6.5–8.1)

## 2019-12-15 LAB — CBC
HCT: 35 % — ABNORMAL LOW (ref 39.0–52.0)
Hemoglobin: 11.2 g/dL — ABNORMAL LOW (ref 13.0–17.0)
MCH: 26.9 pg (ref 26.0–34.0)
MCHC: 32 g/dL (ref 30.0–36.0)
MCV: 83.9 fL (ref 80.0–100.0)
Platelets: 417 10*3/uL — ABNORMAL HIGH (ref 150–400)
RBC: 4.17 MIL/uL — ABNORMAL LOW (ref 4.22–5.81)
RDW: 14.6 % (ref 11.5–15.5)
WBC: 12.1 10*3/uL — ABNORMAL HIGH (ref 4.0–10.5)
nRBC: 0 % (ref 0.0–0.2)

## 2019-12-15 LAB — COOXEMETRY PANEL
Carboxyhemoglobin: 0.8 % (ref 0.5–1.5)
Methemoglobin: 0.9 % (ref 0.0–1.5)
O2 Saturation: 72.2 %
Total hemoglobin: 14.2 g/dL (ref 12.0–16.0)

## 2019-12-15 LAB — PROTIME-INR
INR: 3.4 — ABNORMAL HIGH (ref 0.8–1.2)
Prothrombin Time: 33.2 seconds — ABNORMAL HIGH (ref 11.4–15.2)

## 2019-12-15 MED ORDER — METHYLPREDNISOLONE 4 MG PO TABS: 4.0000 mg | ORAL_TABLET | Freq: Two times a day (BID) | ORAL | Status: DC

## 2019-12-15 MED ORDER — AMIODARONE HCL 200 MG PO TABS: 200.0000 mg | ORAL_TABLET | Freq: Two times a day (BID) | ORAL | 6 refills | Status: DC

## 2019-12-15 MED ORDER — ISOSORB DINITRATE-HYDRALAZINE 20-37.5 MG PO TABS: 0.5000 | ORAL_TABLET | Freq: Three times a day (TID) | ORAL | Status: DC

## 2019-12-15 MED ORDER — METHYLPREDNISOLONE 4 MG PO TABS
4.0000 mg | ORAL_TABLET | Freq: Three times a day (TID) | ORAL | Status: DC
  Filled 2019-12-15 (×2): qty 1

## 2019-12-15 MED ORDER — CLOPIDOGREL BISULFATE 75 MG PO TABS: 75.0000 mg | ORAL_TABLET | Freq: Every day | ORAL | 6 refills | Status: DC

## 2019-12-15 MED ORDER — METHYLPREDNISOLONE 4 MG PO TABS: 4.0000 mg | ORAL_TABLET | Freq: Two times a day (BID) | ORAL | 0 refills | Status: DC

## 2019-12-15 MED ORDER — PREDNISONE 5 MG PO TABS: 5.0000 mg | ORAL_TABLET | Freq: Every day | ORAL | Status: AC

## 2019-12-15 MED ORDER — METHYLPREDNISOLONE 4 MG PO TABS: 4.0000 mg | ORAL_TABLET | Freq: Every day | ORAL | Status: DC

## 2019-12-15 MED ORDER — WARFARIN SODIUM 1 MG PO TABS
1.0000 mg | ORAL_TABLET | Freq: Every day | ORAL | Status: DC
  Administered 2019-12-15: 1 mg via ORAL
  Filled 2019-12-15: qty 1

## 2019-12-15 MED ORDER — TORSEMIDE 20 MG PO TABS: 20.0000 mg | ORAL_TABLET | ORAL | 6 refills | Status: DC | PRN

## 2019-12-15 MED ORDER — NITROGLYCERIN 0.4 MG SL SUBL: 0.4000 mg | SUBLINGUAL_TABLET | SUBLINGUAL | 3 refills | Status: DC | PRN

## 2019-12-15 MED ORDER — PANTOPRAZOLE SODIUM 40 MG PO TBEC: 40.0000 mg | DELAYED_RELEASE_TABLET | Freq: Every day | ORAL | 6 refills | Status: DC

## 2019-12-15 MED ORDER — ROSUVASTATIN CALCIUM 40 MG PO TABS: 40.0000 mg | ORAL_TABLET | Freq: Every day | ORAL | 6 refills | Status: DC

## 2019-12-15 MED ORDER — PREDNISONE 5 MG PO TABS: 5.0000 mg | ORAL_TABLET | Freq: Every day | ORAL | Status: DC

## 2019-12-15 MED ORDER — ASPIRIN 81 MG PO CHEW: 81.0000 mg | CHEWABLE_TABLET | Freq: Every day | ORAL | 0 refills | Status: AC

## 2019-12-15 MED ORDER — METHYLPREDNISOLONE 4 MG PO TABS: 4.0000 mg | ORAL_TABLET | Freq: Every day | ORAL | 0 refills | Status: DC

## 2019-12-15 MED ORDER — ISOSORB DINITRATE-HYDRALAZINE 20-37.5 MG PO TABS: 1.0000 | ORAL_TABLET | Freq: Three times a day (TID) | ORAL | Status: DC

## 2019-12-15 MED ORDER — ISOSORB DINITRATE-HYDRALAZINE 20-37.5 MG PO TABS: 0.5000 | ORAL_TABLET | Freq: Three times a day (TID) | ORAL | 6 refills | Status: DC

## 2019-12-15 MED ORDER — WARFARIN SODIUM 1 MG PO TABS: 1.0000 mg | ORAL_TABLET | Freq: Every day | ORAL | 6 refills | Status: DC

## 2019-12-15 MED ORDER — METHYLPREDNISOLONE 4 MG PO TABS: 4.0000 mg | ORAL_TABLET | Freq: Three times a day (TID) | ORAL | 0 refills | Status: DC

## 2019-12-15 MED ORDER — TACROLIMUS 1 MG PO CAPS: 3.0000 mg | ORAL_CAPSULE | Freq: Two times a day (BID) | ORAL | 0 refills | Status: DC

## 2019-12-15 MED FILL — ROSUVASTATIN CALCIUM 40 MG: 40 | 30 days supply | Qty: 30 | Fill #0

## 2019-12-15 MED FILL — WARFARIN SODIUM 1 MG TABLET: 1 | 45 days supply | Qty: 45 | Fill #0

## 2019-12-15 MED FILL — PANTOPRAZOLE SOD DR 40 MG T: 40 | 30 days supply | Qty: 30 | Fill #0

## 2019-12-15 MED FILL — BIDIL 20-37.5 MG TABS: 20-37.5 | 30 days supply | Qty: 45 | Fill #0

## 2019-12-15 MED FILL — TORSEMIDE 20 MG TABLET: 20 | 30 days supply | Qty: 30 | Fill #0

## 2019-12-15 MED FILL — CLOPIDOGREL 75 MG TABLET: 75 | 30 days supply | Qty: 30 | Fill #0

## 2019-12-15 MED FILL — AMIODARONE HCL 200 MG TAB: 200 | 30 days supply | Qty: 60 | Fill #0

## 2019-12-15 NOTE — Progress Notes (Addendum)
Patient ID: Casey [name]., male   DOB: [birth year], 59 y.o.   MRN: [id number]      Advanced Heart Failure Rounding Note  PCP-Cardiologist: No primary care provider on file.   Subjective:    Patient taken on OR 10/19 for Impella 5.5, unable to place due to sludge/early thrombus present at LV apex.  Subsequently returned [place], developed AF/RVR and started on amiodarone, converted to NSR.   Yesterday milrinone, heparin, and amio drip stopped.   SCr continues to trend down, 5.18>>5.00>>4.71>>3.93>>3.8>3.4   Denies SOB . Denies chest pain.    RHC Procedural Findings (on milrinone 0.25 mcg/kg/min): Hemodynamics (mmHg) RA mean 4 RV 20/5 PA 22/4, mean 14 PCWP mean 5 Oxygen saturations: PA 65% AO 98% Cardiac Output (Fick) 4.5  Cardiac Index (Fick) 2.06 Cardiac Output (Thermo) 4.5 Cardiac Index (Thermo) 2.06  Objective:   Weight Range: 89.5 kg Body mass index is 26.04 kg/m.   Vital Signs:   Temp:  [97.6 F (36.4 C)-98.5 F (36.9 C)] 98.5 F (36.9 C) (10/29 0821) Pulse Rate:  [86-94] 93 (10/29 0821) Resp:  [18-20] 20 (10/29 0821) BP: (106-141)/(68-99) 132/97 (10/29 0821) SpO2:  [95 %-99 %] 95 % (10/29 0821) Weight:  [89.5 kg] 89.5 kg (10/29 0409) Last BM Date: 12/14/19  Weight change: Filed Weights   12/13/19 0324 12/14/19 0409 12/15/19 0409  Weight: 90.5 kg 90.4 kg 89.5 kg    Intake/Output:   Intake/Output Summary (Last 24 hours) at 12/15/2019 0925 Last data filed at 12/15/2019 0430 Gross per 24 hour  Intake 168.27 ml  Output 1025 ml  Net -856.73 ml      Physical Exam   CVP 6 General:  Well appearing. No resp difficulty HEENT: normal Neck: supple. no JVD. Carotids 2+ bilat; no bruits. No lymphadenopathy or thryomegaly appreciated. RIJ Cor: PMI nondisplaced. Regular rate & rhythm. No rubs, gallops or murmurs. Lungs: clear Abdomen: soft, nontender, nondistended. No hepatosplenomegaly. No bruits or masses. Good bowel sounds. Extremities: no cyanosis,  clubbing, rash, edema Neuro: alert & orientedx3, cranial nerves grossly intact. moves all 4 extremities w/o difficulty. Affect pleasant    Telemetry   SR 80-90s   Labs    CBC Recent Labs    12/14/19 0500 12/15/19 0500  WBC 11.4* 12.1*  HGB 10.6* 11.2*  HCT 32.4* 35.0*  MCV 84.6 83.9  PLT 368 664*   Basic Metabolic Panel Recent Labs    12/14/19 0509 12/15/19 0500  NA 139 136  K 3.8 4.3  CL 108 105  CO2 19* 20*  GLUCOSE 141* 198*  BUN 34* 35*  CREATININE 3.78* 3.38*  CALCIUM 9.4 9.5  PHOS 3.3 3.8   Liver Function Tests Recent Labs    12/13/19 0336 12/13/19 0336 12/14/19 0509 12/15/19 0500  AST 28  --   --  30  ALT 32  --   --  43  ALKPHOS 156*  --   --  151*  BILITOT 0.6  --   --  0.6  PROT 6.2*  --   --  6.7  ALBUMIN 2.1*   < > 2.2* 2.4*  2.4*   < > = values in this interval not displayed.   No results for input(s): LIPASE, AMYLASE in the last 72 hours. Cardiac Enzymes No results for input(s): CKTOTAL, CKMB, CKMBINDEX, TROPONINI in the last 72 hours.  BNP: BNP (last 3 results) No results for input(s): BNP in the last 8760 hours.  ProBNP (last 3 results) No results for input(s): PROBNP in  the last 8760 hours.   D-Dimer No results for input(s): DDIMER in the last 72 hours. Hemoglobin A1C No results for input(s): HGBA1C in the last 72 hours. Fasting Lipid Panel No results for input(s): CHOL, HDL, LDLCALC, TRIG, CHOLHDL, LDLDIRECT in the last 72 hours. Thyroid Function Tests No results for input(s): TSH, T4TOTAL, T3FREE, THYROIDAB in the last 72 hours.  Invalid input(s): FREET3  Other results:   Imaging    No results found.   Medications:     Scheduled Medications: . amiodarone  200 mg Oral BID  . aspirin  81 mg Oral Daily  . Chlorhexidine Gluconate Cloth  6 each Topical Daily  . clopidogrel  75 mg Oral Daily  . insulin aspart  0-9 Units Subcutaneous Q4H  . insulin detemir  10 Units Subcutaneous Daily  . isosorbide-hydrALAZINE   0.5 tablet Oral TID  . methylPREDNISolone  4 mg Oral 3 x daily with food  . [START ON 12/16/2019] methylPREDNISolone  4 mg Oral 4X daily taper  . methylPREDNISolone  8 mg Oral Nightly  . metoCLOPramide (REGLAN) injection  5 mg Intravenous TID  . pantoprazole  40 mg Oral Daily  . rosuvastatin  40 mg Oral Daily  . sodium bicarbonate  1,300 mg Oral TID  . sodium chloride flush  10-40 mL Intracatheter Q12H  . sodium chloride flush  3 mL Intravenous Q12H  . tacrolimus  3 mg Oral BID  . Warfarin - Pharmacist Dosing Inpatient   Does not apply q1600    Infusions: . sodium chloride 10 mL/hr at 12/09/19 1900  . albumin human      PRN Medications: acetaminophen, ALPRAZolam, phenol, sodium chloride flush  Assessment/Plan   1. Shock: Ischemic cardiomyopathy with EF 20-25% post-late presentation anterior MI.  Concern for cardiogenic shock but possible component of septic shock as well (gut source).  Excellent cardiac output by thermodilution and co-ox.  - Off milrinone. Stable CO-OX - Volume status stable. CVP 6. No diuretics today. He takes torsemide as needed at home.  - No ARB/ARNi, SGLT2i nor dig w/ AKI  - Continue Bidil 1/2 tab tid.   - If stabilizes and creatinine comes down, consider PCI to LCx system down the road.  2. CAD: Delayed presentation anterior MI (CP began 10/15, PCI on 10/17).  He had DES to LAD, has residual severe up to 99% mid to distal RCA stenosis that is not revascularizable.  He has 95% mid-distal LCx stenosis and 80% OM3 stenosis that could be intervened upon.   - He is on ASA + Plavix  + statin post-MI.  No chest pain. .  - Plan to stop ASA after 30 days (will continue Plavix + coumadin)  - As above, will ideally have eventual PCI to LCx system.  3. AKI on CKD stage 3: Patient has history of renal transplant at [facility] in 2011.  Baseline creatinine 1.4, peaked at 5.18 but starting to trend down, 3.4  today.  Suspect combination of contrast-induced nephropathy and  cardiorenal syndrome in setting of cardiogenic shock.  - Tacrolimus dose has been decreased, level send 10/19 appears to have been lost, resending per nephrology.   - As above, no diuretics.  - Nephrology now following.  4. Elevated LFTs: Mild elevation, suspect shock liver with hypotension/cardiogenic shock. LFTs normalized. 5. DM2: SSI.  6. Ileus: Abdominal CT confirmed no obstruction.  - Improved, diet advanced.    7. Sludge LV apex/early thrombus: He is warfarin.   8. ID: Afebrile.  PCT 61-->33.  ?  Component of septic shock from gut source given ileus/symptoms, treated w/ 7 day course of Zosyn. Afebrile and cultures negative.  9. Atrial fibrillation: Paroxysmal.  Maintaining NSR.  Off amio drip. Continue  amio 200 mg twice a day . INR 3.4 Continue coumadin. 10. L great toe pain Suspected gout but uric acid 4.6. Resolved today.    Darrick Grinder, NP-C  12/15/2019 9:25 AM  Patient seen with NP, agree with the above note.   Stable today, CVP 6, creatinine down to 3.38, co-ox 72%. No complaints, no dyspnea.   General: NAD Neck: No JVD, no thyromegaly or thyroid nodule.  Lungs: Clear to auscultation bilaterally with normal respiratory effort. CV: Nondisplaced PMI.  Heart regular S1/S2, no S3/S4, 1/6 SEM RUSB.  No peripheral edema.  N Abdomen: Soft, nontender, no hepatosplenomegaly, no distention.  Skin: Intact without lesions or rashes.  Neurologic: Alert and oriented x 3.  Psych: Normal affect. Extremities: No clubbing or cyanosis.  HEENT: Normal.   With creatinine trending down steadily, off milrinone, and on warfarin/off heparin, I think he can go home today.  Discussed with Dr. Moshe Cipro.   He will need follow in the coumadin clinic in [place], will arrange.  He will remain on Plavix + coumadin.  He will take aspirin 81 mg for now but will stop 1 month post-PCI (11/17).  If his creatinine goes back to baseline, can consider PCI to LCx system in the future.   He is in NSR, he  will continue amiodarone 200 mg bid x 1 week then to 200 mg daily.   He will continue Bidil 0.5 tid.  With AKI, no other HF meds for now, anticipate starting Coreg as outpatient.   Loralie Champagne 12/15/2019 11:01 AM

## 2019-12-15 NOTE — Discharge Summary (Signed)
Advanced Heart Failure Team  Discharge Summary   Patient ID: Casey [name]. MRN: [id number], DOB/AGE: 1961-01-29 59 y.o. Admit date: 12/05/2019 D/C date:     12/15/2019   Primary Discharge Diagnoses:  1. Shock: Ischemic cardiomyopathy with EF 20-25% post-late presentation anterior MI.  2. CAD: Delayed presentation anterior MI (CP began 10/15, PCI on 10/17).  3. AKI on CKD stage 3: Patient has history of renal transplant at [facility] in 2011. 4. Elevated LFTs 5. DM2: SSI.  6. Ileus 7. Sludge LV apex/early thrombus 8. ID 9. Atrial fibrillation: Paroxysmal.   10. L great toe pain    Hospital Course:  Casey [name] is a 59 year old s/p remote renal transplant in 2011 at [facility] + h/o HTN, T2DM and HLD, initially admitted to [facility] on 10/17 with late presentationanteriorSTEMI. HS trop >27,000. LHC showed severe 3VCAD w/ culprit lesion being an occluded mid LAD, that was treated w/ PCI +DES. Also w/significant disease involving the RCA and left circumflex. The whole distal RCA is diffusely diseased and not revascularizable. The left circumflex can be treated with staged PCI(defered at time of initial cath due to underlying CKD). LVG demonstrated severely reduced LVEF ~15%. 2D Echo showed EF ~20-25%, ?early thrombus formation at apex. RV ok.   Post cath course c/b cardiogenic shockw/ hypotension andAKI, SCr 1.4>>1.8>>3.1. AST 49, ALT 45.Started on empiric milrinone 0.25 mgc/kg/min. He is now transferred to [facility] for further management and AHF evaluation. Planned for mechanical support and was taken to the OR but TEE showed probable LV thrombus which made Impella support prohibitive. He remained intubated due to  critical condition and later extubated and weaned off oxygen. He was managed with multiple pressors and gradually improvement. Pressors weaned off with stable CO-OX.   Hospital course complicated by cardiogenic shock and AKI. See below for detailed problem list. He was referred to PT and  set up for HHPT. He was set up with follow up with Coumadin Clinic at [facility]. He will remain on Plavix + coumadin.  He will take aspirin 81 mg for now but will stop 1 month post-PCI (11/17).  If his creatinine goes back to baseline, can consider PCI to LCx system in the future.   Consultants CT surgery Neurology  CC[facility] Nephrology   1. Shock: Ischemic cardiomyopathy with EF 20-25% post-late presentation anterior MI. Concern for cardiogenic shock but possible component of septic shock as well (gut source).  At one point on dual pressors. He was not a candidate for impella due to developing LV thrombus on TEE.  - As he improved pressors gradually weaned off.  - Volume status stable on the day discharge. CVP 6. No diuretics today. He will resume torsemide 20 mg as needed for 3 pound weight gain.   - No ARB/ARNi, SGLT2i nor dig w/ AKI  - Continue Bidil 1/2 tab tid.    - If stabilizes and creatinine comes down, consider PCI to LCx system down the road.  2. CAD: Delayed presentation anterior MI (CP began 10/15, PCI on 10/17). He had DES to LAD, has residual severe up to 99% mid to distal RCA stenosis that is not revascularizable. He has 95% mid-distal LCx stenosis and 80% OM3 stenosis that could be intervened upon.   - He is on ASA + Plavix  + statin post-MI.  No chest pain. - Plan to stop ASA after 30 days (will continue Plavix + coumadin)  - As above, will ideally have eventual PCI to LCx system.  3.  AKI on CKD stage 3: Patient has history of renal transplant at [facility] in 2011. Baseline creatinine 1.4, peaked at 5.18 but starting to trend down, 3.4  today. Suspect combination of contrast-induced nephropathy and cardiorenal syndrome in setting of cardiogenic shock.  - Tacrolimus dose has been decreased, level send 10/19 appears to have been lost, resending per nephrology.   -  Nephrology followed closely and will set up follow up in the Community.  4. Elevated LFTs: Mild elevation, suspect shock liver  with hypotension/cardiogenic shock. LFTs normalized. 5. DM2: SSI.  6. Ileus: Abdominal CT confirmed no obstruction.  - Improved, diet advanced.   7. Sludge LV apex/early thrombus: He is warfarin.   8. ID: Afebrile.  PCT 61-->33.  ?Component of septic shock from gut source given ileus/symptoms, treated w/ 7 day course of Zosyn. Afebrile and cultures negative.  9. Atrial fibrillation: Paroxysmal.  Maintaining NSR.  Off amio drip. Continue  amio 200 mg twice a day . INR 3.4 Continue coumadin. He was set up with follow up at the [facility] in [place] then can follow up at [facility] in [place].  10. L great toe pain Suspected gout but uric acid 4.6. Resolved today.     Discharge Vitals: Blood pressure 111/79, pulse 94, temperature 98.5 F (36.9 C), temperature source Oral, resp. rate 20, height 6\' 1"  (1.854 m), weight 89.5 kg, SpO2 96 %.  Labs: Lab Results  Component Value Date   WBC 12.1 (H) 12/15/2019   HGB 11.2 (L) 12/15/2019   HCT 35.0 (L) 12/15/2019   MCV 83.9 12/15/2019   PLT 417 (H) 12/15/2019    Recent Labs  Lab 12/15/19 0500  NA 136  K 4.3  CL 105  CO2 20*  BUN 35*  CREATININE 3.38*  CALCIUM 9.5  PROT 6.7  BILITOT 0.6  ALKPHOS 151*  ALT 43  AST 30  GLUCOSE 198*   Lab Results  Component Value Date   CHOL 284 (H) 12/03/2019   HDL 75 12/03/2019   LDLCALC 185 (H) 12/03/2019   TRIG 121 12/03/2019   BNP (last 3 results) No results for input(s): BNP in the last 8760 hours.  ProBNP (last 3 results) No results for input(s): PROBNP in the last 8760 hours.   Diagnostic Studies/Procedures  Echo 12/04/19-There is heavy smoke in the apex with possible early thrombus formation. . Left ventricular ejection fraction, by estimation, is 20 to 25%. The left ventricle has severely decreased  function. The left ventricle demonstrates regional wall motion abnormalities (see scoring diagram/findings for description). The left ventricular internal  cavity size was mildly  dilated. There is mild left ventricular hypertrophy. Left ventricular  diastolic parameters are consistent with Grade I diastolic dysfunction (impaired relaxation). There is akinesis of the left ventricular, mid-apical anteroseptal wall, anterolateral wall and  anterior segment. There is dyskinesis of the left ventricular, apical  segment.  2. Right ventricular systolic function is normal. The right ventricular  size is normal.  3. The mitral valve is normal in structure. No evidence of mitral valve  regurgitation. No evidence of mitral stenosis.  4. The aortic valve is normal in structure. Aortic valve regurgitation is  not visualized. Mild aortic valve sclerosis is present, with no evidence  of aortic valve stenosis.  5. The inferior vena cava is normal in size with greater than 50%  respiratory variability, suggesting right atrial pressure of 3 mmHg.   [place] 12/05/19 Procedural Findings (on milrinone 0.25 mcg/kg/min): Hemodynamics (mmHg) RA mean 4  RV 20/5 PA 22/4, mean 14 PCWP mean 5 Oxygen saturations: PA 65% AO 98% Cardiac Output (Fick) 4.5  Cardiac Index (Fick) 2.06 Cardiac Output (Thermo) 4.5 Cardiac Index (Thermo) 2.06   Discharge Medications   Allergies as of 12/15/2019   No Known Allergies     Medication List    STOP taking these medications   amLODipine 10 MG tablet Commonly known as: NORVASC   atorvastatin 80 MG tablet Commonly known as: LIPITOR   cholecalciferol 25 MCG (1000 UNIT) tablet Commonly known as: VITAMIN D3   famotidine 20 MG tablet Commonly known as: PEPCID   glipiZIDE 10 MG 24 hr tablet Commonly known as: GLUCOTROL XL   heparin 25000-0.45 UT/250ML-% infusion   insulin aspart 100 UNIT/ML injection Commonly known as: novoLOG   lisinopril 20 MG tablet Commonly known as: ZESTRIL   milrinone 20 MG/100 ML Soln infusion Commonly known as: PRIMACOR   ticagrelor 90 MG Tabs tablet Commonly known as:  BRILINTA     TAKE these medications   amiodarone 200 MG tablet Commonly known as: PACERONE Take 1 tablet (200 mg total) by mouth 2 (two) times daily.   aspirin 81 MG chewable tablet Chew 1 tablet (81 mg total) by mouth daily for 22 days.   clopidogrel 75 MG tablet Commonly known as: PLAVIX Take 1 tablet (75 mg total) by mouth daily. Start taking on: December 16, 2019   isosorbide-hydrALAZINE 20-37.5 MG tablet Commonly known as: BIDIL Take 0.5 tablets by mouth 3 (three) times daily.   loratadine 10 MG tablet Commonly known as: CLARITIN Take 10 mg by mouth daily as needed for allergies.   methylPREDNISolone 4 MG tablet Commonly known as: MEDROL Take 1 tablet (4 mg total) by mouth 3 (three) times daily.   methylPREDNISolone 4 MG tablet Commonly known as: MEDROL Take 1 tablet (4 mg total) by mouth 2 (two) times daily. Start taking on: December 16, 2019   methylPREDNISolone 4 MG tablet Commonly known as: MEDROL Take 1 tablet (4 mg total) by mouth daily. Start taking on: December 17, 2019   pantoprazole 40 MG tablet Commonly known as: PROTONIX Take 1 tablet (40 mg total) by mouth daily. Start taking on: December 16, 2019   predniSONE 5 MG tablet Commonly known as: DELTASONE Take 1 tablet (5 mg total) by mouth daily with breakfast. Start taking on: December 18, 2019 What changed:   when to take this  These instructions start on December 18, 2019. If you are unsure what to do until then, ask your doctor or other care provider.   rosuvastatin 40 MG tablet Commonly known as: CRESTOR Take 1 tablet (40 mg total) by mouth daily. Start taking on: December 16, 2019   simethicone 125 MG chewable tablet Commonly known as: MYLICON Chew 854 mg by mouth every 6 (six) hours as needed for flatulence.   tacrolimus 1 MG capsule Commonly known as: PROGRAF Take 3 capsules (3 mg total) by mouth 2 (two) times daily. What changed: how much to take   torsemide 20 MG tablet Commonly  known as: DEMADEX Take 1 tablet (20 mg total) by mouth as needed. 3 pound weight gain in 24 hours What changed:   when to take this  reasons to take this  additional instructions   warfarin 1 MG tablet Commonly known as: COUMADIN Take 1 tablet (1 mg total) by mouth daily at 4 PM. Start taking on: December 16, 2019            Durable Medical  Equipment  (From admission, onward)         Start     Ordered   12/15/19 1017  For home use only DME Walker rolling  Once       Comments: Heart Fialure. Muscle weakness  Question Answer Comment  Walker: With 5 Inch Wheels   Patient needs a walker to treat with the following condition Cardiogenic shock ([place])      12/15/19 1017   12/15/19 1016  Heart failure home health orders  (Heart failure home health orders / Face to face)  Once       Comments: Heart Failure Follow-up Care:  Verify follow-up appointments per Patient Discharge Instructions. Confirm transportation arranged. Reconcile home medications with discharge medication list. Remove discontinued medications from use. Assist patient/caregiver to manage medications using pill box. Reinforce low sodium food selection Assessments: Vital signs and oxygen saturation at each visit. Assess home environment for safety concerns, caregiver support and availability of low-sodium foods. Consult Education officer, museum, PT/OT, Dietitian, and CNA based on assessments. Perform comprehensive cardiopulmonary assessment. Notify MD for any change in condition or weight gain of 3 pounds in one day or 5 pounds in one week with symptoms. Daily Weights and Symptom Monitoring: Ensure patient has access to scales. Teach patient/caregiver to weigh daily before breakfast and after voiding using same scale and record.    Teach patient/caregiver to track weight and symptoms and when to notify Provider. Activity: Develop individualized activity plan with patient/caregiver.  HHPT  Question Answer Comment  Heart  Failure Follow-up Care Advanced Heart Failure (AHF) Clinic at [place] [facility] Set up telemonitoring equipment to monitor daily vital signs, weights and oxygen saturation   Lab frequency Other see comments   Fax lab results to AHF Clinic at [phone]   Diet Low Sodium Heart Healthy   Fluid restrictions: 1800 mL Fluid      12/15/19 1016          Disposition   The patient will be discharged in stable condition to home. Discharge Instructions    Amb Referral to Cardiac Rehabilitation   Complete by: As directed    Diagnosis:  Coronary Stents STEMI PTCA     After initial evaluation and assessments completed: Virtual Based Care may be provided alone or in conjunction with Phase 2 Cardiac Rehab based on patient barriers.: Yes   Diet - low sodium heart healthy   Complete by: As directed    Heart Failure patients record your daily weight using the same scale at the same time of day   Complete by: As directed    Increase activity slowly   Complete by: As directed       Follow-up Information    [facility], [facility] Follow up.   Specialty: Home Health Services Why: for home health services. they will call 1-2 days after you get home to set up your first home visit.  Contact information: [place] [address] [phone]        [facility] Follow up on 12/26/2019.   Specialty: Cardiology Why: at 10:30 for Coumadin.  Garage Code [id number]  Contact information: [address] [id number] [place] [phone]       [place] [facility] Follow up on 12/19/2019.   Specialty: Cardiology Why: at 2:30  Contact information: [address] [place]  Duration of Discharge Encounter: Greater than 35 minutes   Signed, Kimm Ungaro  NP-C  12/15/2019, 11:02 AM

## 2019-12-15 NOTE — Progress Notes (Signed)
ANTICOAGULATION CONSULT NOTE  Pharmacy Consult for warfarin Indication: apical thrombus  No Known Allergies  Patient Measurements: Height: 6\' 1"  (185.4 cm) Weight: 89.5 kg (197 lb 6.4 oz) IBW/kg (Calculated) : 79.9 Heparin Dosing Weight: 92 kg  Vital Signs: Temp: 98.5 F (36.9 C) (10/29 0821) Temp Source: Oral (10/29 0821) BP: 132/97 (10/29 0821) Pulse Rate: 93 (10/29 0821)  Labs: Recent Labs    12/13/19 0336 12/13/19 0336 12/13/19 0529 12/13/19 2013 12/14/19 0500 12/14/19 0509 12/14/19 0704 12/15/19 0500  HGB 11.0*   < >  --   --  10.6*  --   --  11.2*  HCT 34.4*  --   --   --  32.4*  --   --  35.0*  PLT 350  --   --   --  368  --   --  417*  LABPROT 19.0*  --   --   --   --   --  35.0* 33.2*  INR 1.7*  --   --   --   --   --  3.6* 3.4*  HEPARINUNFRC 0.19*   < > 0.21* 0.47  --   --  0.35  --   CREATININE 3.93*  --   --   --   --  3.78*  --  3.38*   < > = values in this interval not displayed.    Estimated Creatinine Clearance: 26.6 mL/min (A) (by C-G formula based on SCr of 3.38 mg/dL (H)).   Medical History: Past Medical History:  Diagnosis Date  . Chronic kidney disease 04/2009   Kidney Transplant  . Diabetes mellitus   . GERD (gastroesophageal reflux disease)    as needed reflux  . Hypertension   . 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     Assessment: 59 year old male presented with STEMI s/p PCI to mid LAD. Pt noted to have apical smoke on ECHO concerning for early thrombus, confirmed on TEE. Pharmacy asked to continue heparin and warfarin  INR 3.4 after 2 doses of warfarin, held yesterday.. Will hold warfarin tonight. Hgb stable overall to 11, plt wnl. No bleeding issues noted.   Patient for discharge today. I have discussed anticoagulation and antiplatelet medications with patient.   Will restart warfarin 1mg  10/30 until seen by coumadin clinic 11/2. Asa to complete 30 days Plavix at least 6-12 months until stopped  by cardiology Prograf to 3 bid Finish steroid dose pack then restart chronic prednisone on Monday.  Goal of Therapy:  INR goal 2-3 Heparin level 0.3-0.7 units/ml Monitor platelets by anticoagulation protocol: Yes   Erin Hearing PharmD., BCPS Clinical Pharmacist 12/15/2019 9:31 AM

## 2019-12-15 NOTE — Progress Notes (Signed)
[id number] Completed exercise ed with pt and wife. Pt sleepy but wife present and voiced understanding of ed. Reviewed walking for exercise and listening to his body. Knows to notify cardiologist if any CP. Reviewed NTG use, encouraged plavix use as ordered. Awaiting rolling walker.  Graylon Good RN BSN 12/15/2019 11:42 AM

## 2019-12-15 NOTE — Progress Notes (Signed)
Physical Therapy Treatment Patient Details Name: Casey [name]. MRN: [id number] DOB: [birth year] Today's Date: 12/15/2019    History of Present Illness Pt is 59 yo male admitted with anterior STEMI.  Underwent cardiac cath on 10/17 and had DES to LAD complicated by post-cath cardiogenic shock and acute on chronic kidney injury. He was transferred to [facility] for intervention.  He went to the cath lab for [place] and then TEE in the OR on 10/19. Initially planned for impella however procedure cancelled on finding an LV thrombus on TEE. 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 Intubated 10/19-10/20.    PT Comments    Pt supine in bed on arrival this session.  Pt continues to fatigue quickly with activity but motivated to improve.  He has good awareness of when his body needs to rest.  He successfully climbed full flight of stairs this session with mild quad instability noted.  Pt to d/c home with support from his spouse.  Plan for HHPT and RW for d/c home.      Follow Up Recommendations  Supervision/Assistance - 24 hour;Home health PT     Equipment Recommendations  Rolling walker with 5" wheels    Recommendations for Other Services       Precautions / Restrictions Precautions Precautions: Fall Precaution Comments: IJ removed 10/29 Restrictions Weight Bearing Restrictions: No    Mobility  Bed Mobility Overal bed mobility: Needs Assistance Bed Mobility: Supine to Sit;Sit to Supine     Supine to sit: Supervision Sit to supine: Min assist   General bed mobility comments: Pt fatigued post gt and required lifting of B LEs back into bed against gravity.  Transfers Overall transfer level: Needs assistance Equipment used: Rolling walker (2 wheeled) Transfers: Sit to/from Stand Sit to Stand: Supervision         General transfer comment: Supervision for safety  Ambulation/Gait Ambulation/Gait assistance: Supervision Gait  Distance (Feet): 150 Feet Assistive device: Rolling walker (2 wheeled) Gait Pattern/deviations: Step-through pattern;Decreased stride length;Staggering right     General Gait Details: Pt required use of RW for energy conservation.  Pt required standing breaks x2 trials this session due to DOE and fatigue.  HR 109 bpm.  Pt SPO2 99% on RA.  BP elevated after gt trial at 145/92.   Stairs Stairs: Yes Stairs assistance: Supervision Stair Management: One rail Right;Backwards;Forwards;Alternating pattern Number of Stairs: 11 General stair comments: Cues for sequencing and hand placement.  Pt slow and guarded with noted quad weakness during stair training.   Wheelchair Mobility    Modified Rankin (Stroke Patients Only)       Balance Overall balance assessment: Needs assistance Sitting-balance support: No upper extremity supported Sitting balance-Leahy Scale: Good       Standing balance-Leahy Scale: Fair Standing balance comment: With RW and heavy B UE support.                            Cognition Arousal/Alertness: Awake/alert Behavior During Therapy: WFL for tasks assessed/performed Overall Cognitive Status: Within Functional Limits for tasks assessed                                 General Comments: Pt appears tired but pleasant and agreeable to PT session.  Fatigues quickly.      Exercises      General Comments  Pertinent Vitals/Pain Pain Assessment: No/denies pain    Home Living                      Prior Function            PT Goals (current goals can now be found in the care plan section) Acute Rehab PT Goals Patient Stated Goal: return home Potential to Achieve Goals: Good Progress towards PT goals: Progressing toward goals    Frequency    Min 3X/week      PT Plan Current plan remains appropriate    Co-evaluation              AM-PAC PT "6 Clicks" Mobility   Outcome Measure  Help needed turning  from your back to your side while in a flat bed without using bedrails?: A Little Help needed moving from lying on your back to sitting on the side of a flat bed without using bedrails?: A Little Help needed moving to and from a bed to a chair (including a wheelchair)?: A Little Help needed standing up from a chair using your arms (e.g., wheelchair or bedside chair)?: A Little Help needed to walk in hospital room?: A Little Help needed climbing 3-5 steps with a railing? : A Little 6 Click Score: 18    End of Session Equipment Utilized During Treatment: Gait belt Activity Tolerance: Patient tolerated treatment well Patient left: with call bell/phone within reach;with family/visitor present;in bed Nurse Communication: Mobility status (informed nursing of elevated BP.) PT Visit Diagnosis: Unsteadiness on feet (R26.81);Muscle weakness (generalized) (M62.81)     Time: 0737-1062 PT Time Calculation (min) (ACUTE ONLY): 24 min  Charges:  $Gait Training: 8-22 mins $Therapeutic Activity: 8-22 mins                     Erasmo Leventhal , PTA Acute Rehabilitation Services Pager [phone] Office [phone]     Jasraj Lappe Eli Hose 12/15/2019, 12:19 PM

## 2019-12-15 NOTE — Care Management (Signed)
12-15-19 1122 Case Manager spoke with patient regarding durable medical equipment (DME)- patient needs rolling walker. Equipment ordered via [place]. DME will be delivered to the room prior to transition home. Graves-Bigelow, Ocie Cornfield, RN, BSN Case Manager

## 2019-12-15 NOTE — Progress Notes (Signed)
Discharged pt via wheelchair to home with wife.  Discharge instructions reviewed and questions answered. PIV removed.

## 2019-12-15 NOTE — Progress Notes (Signed)
[facility] Progress Note    Assessment/ Plan:   1. Acute kidney Injury secondary to cardiogenic shock and diminished perfusion from hypotension (ATN picture) with possible contrast induced injury - crt trended down now 5 days in a row-  More recent U/A less RBC-  Stable protein-  I think low chance of actual rejection  2. 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 Marval Regal), underlying disease=HTN and DM. Continue with tacrolimus and home prednisone dose. 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 Transplant ultrasound reviewed. Agree with lower tac dosing as compared to home (drug interaction with amio).  -Tacrolimus trough still pending  ??, sent 10/19 (goal tacrolimus trough 5-7). No changes today - have low suspicion of rejection.  Tac level must have been lost, resent 10/28 3. AGMA, likely related to AKI, if worsening repeat lactate levels. sodium bicarb 1300mg  TID- is at least stable 4. Shock, cardiogenic +/- septic shock, improved now off all drips 5. Ischemic cardiomyopathy, EF 20-25%. Diurese as needed. Not a candidate for impella given LV thrombus. Off lasix- milrinone per cards- planning for low dose diuretic 6. Anterior STEMI, s/p LHC 10/17 7. LV thrombus: on hep gtt- converting to coumadin 8. Ileus, improving:  Noncon CT revealed ileus and nonobstructing 3 mm calculus in the lower pole of the right iliac fossa renal transplant. Says appetite is good, no nausea 9. Leukocytosis: now off abx 10.  Hypokalemia-  Better 11. ? gout -  Pt has had before-  uric acid low, clinically better on steroids - wen quickly back to his home dose of 5 mg daily  12. Anemia- hgb stable  I spoke to Dr. Aundra Dubin-  Considering discharge which would be fine.  I will set up labs and follow up at [place] well for most part,  left toe improved.  - crt down again some - uop at lease 1025 now on torsemide   Objective:   BP (!) 132/97  (BP Location: Right Arm)   Pulse 93   Temp 98.5 F (36.9 C) (Oral)   Resp 20   Ht 6\' 1"  (1.854 m)   Wt 89.5 kg   SpO2 95%   BMI 26.04 kg/m   Intake/Output Summary (Last 24 hours) at 12/15/2019 1007 Last data filed at 12/15/2019 0900 Gross per 24 hour  Intake 168.27 ml  Output 1325 ml  Net -1156.73 ml   Weight change: -0.862 kg  Physical Exam: Gen:nad, resting comfortably HEENT: dry mucosal membranes [id number], + systolic murmur, no r/g, rrr Resp:cta bl, bl chest expansion Abd: distended, nontender, soft Ext:no edema Neuro: awake, alert, following commands, moves all ext spontaneously  Imaging: No results found.  Labs: BMET Recent Labs  Lab 12/09/19 0407 12/10/19 0426 12/11/19 0403 12/12/19 0227 12/13/19 0336 12/14/19 0509 12/15/19 0500  NA 142 141 140 137 138 139 136  K 3.3* 3.6 3.5 3.5 3.2* 3.8 4.3  CL 106 107 106 106 109 108 105  CO2 22 21* 21* 19* 18* 19* 20*  GLUCOSE 161* 164* 173* 192* 155* 141* 198*  BUN 60* 55* 51* 42* 36* 34* 35*  CREATININE 4.65* 5.18* 5.00* 4.71* 3.93* 3.78* 3.38*  CALCIUM 9.2 9.6 9.7 9.6 9.0 9.4 9.5  PHOS  --   --   --   --   --  3.3 3.8   CBC Recent Labs  Lab 12/12/19 0227 12/13/19 0336 12/14/19 0500 12/15/19 0500  WBC 9.8 11.8* 11.4* 12.1*  HGB  11.8* 11.0* 10.6* 11.2*  HCT 36.4* 34.4* 32.4* 35.0*  MCV 83.1 84.3 84.6 83.9  PLT 324 350 368 417*    Medications:    . amiodarone  200 mg Oral BID  . aspirin  81 mg Oral Daily  . Chlorhexidine Gluconate Cloth  6 each Topical Daily  . clopidogrel  75 mg Oral Daily  . insulin aspart  0-9 Units Subcutaneous Q4H  . insulin detemir  10 Units Subcutaneous Daily  . isosorbide-hydrALAZINE  0.5 tablet Oral TID  . methylPREDNISolone  4 mg Oral 3 x daily with food  . [START ON 12/16/2019] methylPREDNISolone  4 mg Oral 4X daily taper  . methylPREDNISolone  8 mg Oral Nightly  . metoCLOPramide (REGLAN) injection  5 mg Intravenous TID  . pantoprazole  40 mg Oral Daily  .  rosuvastatin  40 mg Oral Daily  . sodium bicarbonate  1,300 mg Oral TID  . sodium chloride flush  10-40 mL Intracatheter Q12H  . sodium chloride flush  3 mL Intravenous Q12H  . tacrolimus  3 mg Oral BID  . Warfarin - Pharmacist Dosing Inpatient   Does not apply [facility] 12/15/2019, 10:07 AM

## 2019-12-15 NOTE — Discharge Instructions (Signed)

## 2019-12-15 NOTE — Progress Notes (Signed)
CVL removed per order, drsg placed, CDI.

## 2019-12-18 LAB — TACROLIMUS LEVEL: Tacrolimus (FK506) - LabCorp: 2.4 ng/mL (ref 2.0–20.0)

## 2019-12-19 ENCOUNTER — Ambulatory Visit (INDEPENDENT_AMBULATORY_CARE_PROVIDER_SITE_OTHER): Payer: BC Managed Care – PPO

## 2019-12-19 ENCOUNTER — Other Ambulatory Visit: Payer: Self-pay

## 2019-12-19 DIAGNOSIS — Z7901 Long term (current) use of anticoagulants: Secondary | ICD-10-CM | POA: Diagnosis not present

## 2019-12-19 DIAGNOSIS — I236 Thrombosis of atrium, auricular appendage, and ventricle as current complications following acute myocardial infarction: Secondary | ICD-10-CM | POA: Diagnosis not present

## 2019-12-19 DIAGNOSIS — I4891 Unspecified atrial fibrillation: Secondary | ICD-10-CM | POA: Diagnosis not present

## 2019-12-19 DIAGNOSIS — I2102 ST elevation (STEMI) myocardial infarction involving left anterior descending coronary artery: Secondary | ICD-10-CM | POA: Diagnosis not present

## 2019-12-19 LAB — POCT INR: INR: 1.9 — AB (ref 2.0–3.0)

## 2019-12-19 NOTE — Patient Instructions (Addendum)
Description   Start taking 1 tablet daily except 1.5 tablets on Tuesdays and Saturdays. Recheck INR in 1 week. Coumadin Clinic [facility] [phone]   A full discussion of the nature of anticoagulants has been carried out.  A benefit risk analysis has been presented to the patient, so that they understand the justification for choosing anticoagulation at this time. The need for frequent and regular monitoring, precise dosage adjustment and compliance is stressed.  Side effects of potential bleeding are discussed.  The patient should avoid any OTC items containing aspirin or ibuprofen, and should avoid great swings in general diet.  Avoid alcohol consumption.  Call if any signs of abnormal bleeding.

## 2019-12-20 LAB — TACROLIMUS LEVEL

## 2019-12-25 ENCOUNTER — Other Ambulatory Visit: Payer: Self-pay

## 2019-12-25 ENCOUNTER — Ambulatory Visit (INDEPENDENT_AMBULATORY_CARE_PROVIDER_SITE_OTHER): Payer: BC Managed Care – PPO

## 2019-12-25 DIAGNOSIS — I236 Thrombosis of atrium, auricular appendage, and ventricle as current complications following acute myocardial infarction: Secondary | ICD-10-CM

## 2019-12-25 DIAGNOSIS — I2102 ST elevation (STEMI) myocardial infarction involving left anterior descending coronary artery: Secondary | ICD-10-CM

## 2019-12-25 DIAGNOSIS — I4891 Unspecified atrial fibrillation: Secondary | ICD-10-CM | POA: Diagnosis not present

## 2019-12-25 DIAGNOSIS — Z7901 Long term (current) use of anticoagulants: Secondary | ICD-10-CM | POA: Diagnosis not present

## 2019-12-25 LAB — POCT INR: INR: 1.3 — AB (ref 2.0–3.0)

## 2019-12-25 NOTE — Patient Instructions (Signed)
-  NO GREENS TODAY - START NEW DOSAGE of 1 tablet daily except 1.5 tablets on MONDAYS, [place].  - Recheck INR in 1 week.   Coumadin Clinic [facility] [phone].

## 2019-12-26 ENCOUNTER — Encounter (HOSPITAL_COMMUNITY): Payer: Self-pay | Admitting: Cardiology

## 2019-12-26 ENCOUNTER — Ambulatory Visit (HOSPITAL_COMMUNITY)
Admission: RE | Admit: 2019-12-26 | Discharge: 2019-12-26 | Disposition: A | Discharge status: Home / Self Care | Payer: BC Managed Care – PPO | Source: Ambulatory Visit | Attending: Cardiology | Admitting: Cardiology

## 2019-12-26 VITALS — BP 138/90 | HR 94 | Wt 189.4 lb

## 2019-12-26 DIAGNOSIS — I4891 Unspecified atrial fibrillation: Secondary | ICD-10-CM | POA: Diagnosis not present

## 2019-12-26 DIAGNOSIS — I480 Paroxysmal atrial fibrillation: Secondary | ICD-10-CM | POA: Diagnosis not present

## 2019-12-26 DIAGNOSIS — T8619 Other complication of kidney transplant: Secondary | ICD-10-CM | POA: Insufficient documentation

## 2019-12-26 DIAGNOSIS — I110 Hypertensive heart disease with heart failure: Secondary | ICD-10-CM | POA: Insufficient documentation

## 2019-12-26 DIAGNOSIS — I5022 Chronic systolic (congestive) heart failure: Secondary | ICD-10-CM

## 2019-12-26 DIAGNOSIS — Z833 Family history of diabetes mellitus: Secondary | ICD-10-CM | POA: Insufficient documentation

## 2019-12-26 DIAGNOSIS — I236 Thrombosis of atrium, auricular appendage, and ventricle as current complications following acute myocardial infarction: Secondary | ICD-10-CM

## 2019-12-26 DIAGNOSIS — Z7901 Long term (current) use of anticoagulants: Secondary | ICD-10-CM | POA: Diagnosis not present

## 2019-12-26 DIAGNOSIS — Z7982 Long term (current) use of aspirin: Secondary | ICD-10-CM | POA: Diagnosis not present

## 2019-12-26 DIAGNOSIS — I513 Intracardiac thrombosis, not elsewhere classified: Secondary | ICD-10-CM | POA: Insufficient documentation

## 2019-12-26 DIAGNOSIS — Z79899 Other long term (current) drug therapy: Secondary | ICD-10-CM | POA: Insufficient documentation

## 2019-12-26 DIAGNOSIS — I252 Old myocardial infarction: Secondary | ICD-10-CM | POA: Insufficient documentation

## 2019-12-26 DIAGNOSIS — I255 Ischemic cardiomyopathy: Secondary | ICD-10-CM | POA: Diagnosis not present

## 2019-12-26 DIAGNOSIS — Z8674 Personal history of sudden cardiac arrest: Secondary | ICD-10-CM | POA: Insufficient documentation

## 2019-12-26 DIAGNOSIS — I2510 Atherosclerotic heart disease of native coronary artery without angina pectoris: Secondary | ICD-10-CM | POA: Diagnosis not present

## 2019-12-26 DIAGNOSIS — N179 Acute kidney failure, unspecified: Secondary | ICD-10-CM | POA: Diagnosis not present

## 2019-12-26 DIAGNOSIS — Z955 Presence of coronary angioplasty implant and graft: Secondary | ICD-10-CM | POA: Diagnosis not present

## 2019-12-26 DIAGNOSIS — E785 Hyperlipidemia, unspecified: Secondary | ICD-10-CM | POA: Diagnosis not present

## 2019-12-26 DIAGNOSIS — Z7902 Long term (current) use of antithrombotics/antiplatelets: Secondary | ICD-10-CM | POA: Diagnosis not present

## 2019-12-26 HISTORY — DX: Heart failure, unspecified: I50.9

## 2019-12-26 HISTORY — DX: Acute myocardial infarction, unspecified: I21.9

## 2019-12-26 LAB — CBC
HCT: 37.5 % — ABNORMAL LOW (ref 39.0–52.0)
Hemoglobin: 11.9 g/dL — ABNORMAL LOW (ref 13.0–17.0)
MCH: 27 pg (ref 26.0–34.0)
MCHC: 31.7 g/dL (ref 30.0–36.0)
MCV: 85.2 fL (ref 80.0–100.0)
Platelets: 440 10*3/uL — ABNORMAL HIGH (ref 150–400)
RBC: 4.4 MIL/uL (ref 4.22–5.81)
RDW: 13.7 % (ref 11.5–15.5)
WBC: 13.1 10*3/uL — ABNORMAL HIGH (ref 4.0–10.5)
nRBC: 0 % (ref 0.0–0.2)

## 2019-12-26 LAB — COMPREHENSIVE METABOLIC PANEL
ALT: 18 U/L (ref 0–44)
AST: 15 U/L (ref 15–41)
Albumin: 3.3 g/dL — ABNORMAL LOW (ref 3.5–5.0)
Alkaline Phosphatase: 89 U/L (ref 38–126)
Anion gap: 10 (ref 5–15)
BUN: 30 mg/dL — ABNORMAL HIGH (ref 6–20)
CO2: 22 mmol/L (ref 22–32)
Calcium: 10.3 mg/dL (ref 8.9–10.3)
Chloride: 102 mmol/L (ref 98–111)
Creatinine, Ser: 2.24 mg/dL — ABNORMAL HIGH (ref 0.61–1.24)
GFR, Estimated: 33 mL/min — ABNORMAL LOW (ref >60)
Glucose, Bld: 423 mg/dL — ABNORMAL HIGH (ref 70–99)
Potassium: 4.7 mmol/L (ref 3.5–5.1)
Sodium: 134 mmol/L — ABNORMAL LOW (ref 135–145)
Total Bilirubin: 0.6 mg/dL (ref 0.3–1.2)
Total Protein: 6.9 g/dL (ref 6.5–8.1)

## 2019-12-26 LAB — LIPID PANEL
Cholesterol: 147 mg/dL (ref 0–200)
HDL: 43 mg/dL (ref >40)
LDL Cholesterol: 68 mg/dL (ref 0–99)
Total CHOL/HDL Ratio: 3.4 RATIO
Triglycerides: 181 mg/dL — ABNORMAL HIGH (ref <150)
VLDL: 36 mg/dL (ref 0–40)

## 2019-12-26 LAB — TSH: TSH: 1.508 u[IU]/mL (ref 0.350–4.500)

## 2019-12-26 MED ORDER — ISOSORB DINITRATE-HYDRALAZINE 20-37.5 MG PO TABS: 1.0000 | ORAL_TABLET | Freq: Three times a day (TID) | ORAL | 6 refills | Status: DC

## 2019-12-26 MED ORDER — AMIODARONE HCL 200 MG PO TABS: 200.0000 mg | ORAL_TABLET | Freq: Every day | ORAL | 6 refills | Status: DC

## 2019-12-26 MED ORDER — CARVEDILOL 3.125 MG PO TABS: 3.1250 mg | ORAL_TABLET | Freq: Two times a day (BID) | ORAL | 3 refills | Status: DC

## 2019-12-26 NOTE — Patient Instructions (Signed)
Start Carvedilol 3.125 mg Twice daily   Increase Bidil to 1 tab Three times a day   Decrease Amiodarone to 200 mg Daily  Labs done today, your results will be available in MyChart, we will contact you for abnormal readings.  You have been referred to Cardiac Rehab at [facility], they will call you to schedule this  Your provider has recommended you wear a [employer] Lifevest. The order has been sent into the company, they will call you and schedule a time to come to your home for delivery and education.  Your physician recommends that you schedule a follow-up appointment in: 3 weeks  If you have any questions or concerns before your next appointment please send [place] a message through [place] or call our office at [phone].    TO LEAVE A MESSAGE FOR THE NURSE SELECT OPTION 2, PLEASE LEAVE A MESSAGE INCLUDING: . YOUR NAME . DATE OF BIRTH . CALL BACK NUMBER . REASON FOR CALL**this is important as we prioritize the call backs  [place] AS LONG AS YOU CALL BEFORE 4:00 PM  At the [facility], you and your health needs are our priority. As part of our continuing mission to provide you with exceptional heart care, we have created designated Provider Care Teams. These Care Teams include your primary Cardiologist (physician) and Advanced Practice Providers (APPs- Physician Assistants and Nurse Practitioners) who all work together to provide you with the care you need, when you need it.   You may see any of the following providers on your designated Care Team at your next follow up: Marland Kitchen Dr Glori Bickers . Dr Loralie Champagne . Darrick Grinder, NP . Lyda Jester, PA . Audry Riles, PharmD   Please be sure to bring in all your medications bottles to every appointment.

## 2019-12-26 NOTE — Progress Notes (Signed)
PCP: Dion Body, MD Cardiology: Dr. Aundra Dubin  59 y.o. with history of renal transplant in 2011, CAD s/p anterior MI, and ischemic cardiomyopathy presents for followup of CHF and CAD.  Patient had his renal transplant at [facility], and has been followed by [facility] as well as [facility] since that time.  He had been doing well until 10/21.  In early 10/21, he developed chest pain.  He did not go immediately to the ER, he presented when the pain had continued for > 1 day.  He was found to have acute anterior MI with late presentation. He went for cath, LAD was occluded and there was severe diffuse disease in the RCA as well as severe disease in the LCx system.  He had DES to the LAD.  He subsequently developed cardiogenic shock as well as suspected septic shock, possible from gut source.  Abdominal imaging showed profound ileus.  He was taken to the OR for Impella 5.5 placement, but this was deferred due to the finding of LV thrombus on intra-op TEE.  He also developed atrial fibrillation with RVR, controlled by amiodarone and eventually converted back to NSR.  He was maintained on milrinone 0.25 with gradual improvement.  However, he developed AKI, likely due to contrast as well as cardiorenal. Creatinine went up to 5.  Creatinine gradually improved down to 3.38 at discharge, and we were able to discontinue milrinone.  Ileus also gradually resolved.  Echo in the hospital showed EF 20-25%.   Patient returns for followup.  He is doing well at home.  He is in NSR today, has not felt palpitations.  No chest pain.  No dyspnea walking on flat ground or up stairs.  No lightheadedness.  BP is mildly elevated today.  He has followup with nephrology.   ECG (personally reviewed): NSR, old anterior MI, old inferior MI  Labs (10/21): K 4.3, creatinine 3.38  PMH: 1. Type 2 diabetes 2. Atrial fibrillation: Paroxysmal, on amiodarone.  3. Renal transplant 2011 [facility].  4. GERD 5. HTN 6. 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 CAD: Late presentation anterior MI in 10/21.  Cath with occluded LAD, long up to 99% mid-distal RCA stenosis, 95% mid-distal LCx, 80% OM3.  He had DES to LAD.   8. Chronic systolic CHF: Ischemic cardiomyopathy. Cardiogenic shock in 10/21 with MI.  - Echo (10/21): EF 20-25%, heavy smoke/early thrombus LV apex, RV normal.  - RHC (10/21, milrinone 0.25): mean RA 4, PA 22/4, mean PCWP 6, CI 2.06 Fick, CI 2.06 thermo.  9. LV thrombus  SH: Married, nonsmoker, no ETOH.  Lives in [place]. 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   Family History  Problem Relation Age of Onset  . Diabetes Mother   . Hyperlipidemia Mother   . Cancer Father    ROS: all systems reviewed and negative except as per HPI.   Current Outpatient Medications  Medication Sig Dispense Refill  . amiodarone (PACERONE) 200 MG tablet Take 1 tablet (200 mg total) by mouth daily. 30 tablet 6  . aspirin 81 MG chewable tablet Chew 1 tablet (81 mg total) by mouth daily for 22 days. 22 tablet 0  . clopidogrel (PLAVIX) 75 MG tablet Take 1 tablet (75 mg total) by mouth daily. 30 tablet 6  . isosorbide-hydrALAZINE (BIDIL) 20-37.5 MG tablet Take 1 tablet by mouth 3 (three) times daily. 90 tablet 6  . loratadine (CLARITIN) 10 MG tablet Take 10 mg by mouth daily as needed for allergies.    Marland Kitchen  pantoprazole (PROTONIX) 40 MG tablet Take 1 tablet (40 mg total) by mouth daily. 30 tablet 6  . predniSONE (DELTASONE) 5 MG tablet Take 1 tablet (5 mg total) by mouth daily with breakfast.    . rosuvastatin (CRESTOR) 40 MG tablet Take 1 tablet (40 mg total) by mouth daily. 30 tablet 6  . simethicone (MYLICON) 941 MG chewable tablet Chew 125 mg by mouth every 6 (six) hours as needed for flatulence.    . tacrolimus (PROGRAF) 1 MG capsule Take 3 capsules (3 mg total) by mouth 2 (two) times daily. 60 capsule 0  . torsemide (DEMADEX) 20 MG tablet Take 1 tablet (20 mg total) by mouth as needed. 3 pound weight gain in 24 hours 30 tablet 6  . warfarin  (COUMADIN) 1 MG tablet Take 1 tablet (1 mg total) by mouth daily at 4 PM. 45 tablet 6  . carvedilol (COREG) 3.125 MG tablet Take 1 tablet (3.125 mg total) by mouth 2 (two) times daily. 60 tablet 3  . nitroGLYCERIN (NITROSTAT) 0.4 MG SL tablet Place 1 tablet (0.4 mg total) under the tongue every 5 (five) minutes as needed for chest pain. 100 tablet 3   No current facility-administered medications for this encounter.   BP 138/90   Pulse 94   Wt 85.9 kg (189 lb 6.4 oz)   SpO2 98%   BMI 24.99 kg/m  General: NAD Neck: No JVD, no thyromegaly or thyroid nodule.  Lungs: Clear to auscultation bilaterally with normal respiratory effort. CV: Nondisplaced PMI.  Heart regular S1/S2, no S3/S4, no murmur.  No peripheral edema.  No carotid bruit.  Normal pedal pulses.  Abdomen: Soft, nontender, no hepatosplenomegaly, no distention.  Skin: Intact without lesions or rashes.  Neurologic: Alert and oriented x 3.  Psych: Normal affect. Extremities: No clubbing or cyanosis.  HEENT: Normal.   Assessment/Plan: 1. CAD: S/p late presentation anterior MI in 10/21 with DES to LAD.  He has residual severe disease in the RCA which is not revascularizable.   He has 95% mid-distal LCx stenosis and 80% OM3 stenosis that could potentially be intervened upon.  No chest pain.  - Refer for cardiac rehab at [facility].  - Continue Plavix, stop ASA 81 at 1 month post-PCI given warfarin use (11/17).  - Continue statin, check lipids today.  - If creatinine decreases back to baseline around 1, will arrange for PCI to LCx system.  2. Atrial fibrillation: Paroxysmal.  He is in NSR today.  - Continue amiodarone, decrease to 200 mg daily.  Needs LFTs/TSH today, will need regular eye exam.  - Continue warfarin. CBC today.  - Consider eventual ablation.  3. Chronic systolic CHF: Ischemic cardiomyopathy.  Echo in 10/21 with EF 20-25%.  BP stable, not volume overloaded on exam.  - He can continue to use torsemide prn.  - Increase Bidil  to 1 tab tid.  - Add Coreg 3.125 mg bid.  - Will use ARNI and spironolactone if creatinine improved.  - BMET today.  - I will arrange for Lifevest.  - Needs echo in 3 months for ?ICD.  4. LV thrombus: Early thrombus in LV in setting of low EF.  - Now on warfarin.  5. AKI in setting of renal transplant: Needs repeat BMET today.   Followup with me in 3 wks.   Loralie Champagne 12/26/2019 1:17 PM

## 2019-12-26 NOTE — Progress Notes (Signed)
Order for [place], records and demographic info faxed to [place] at [phone]

## 2019-12-26 NOTE — Addendum Note (Signed)
Encounter addended by: Scarlette Calico, RN on: 12/26/2019 2:22 PM  Actions taken: Clinical Note Signed

## 2020-01-01 ENCOUNTER — Ambulatory Visit (INDEPENDENT_AMBULATORY_CARE_PROVIDER_SITE_OTHER): Payer: BC Managed Care – PPO

## 2020-01-01 ENCOUNTER — Other Ambulatory Visit: Payer: Self-pay

## 2020-01-01 DIAGNOSIS — I2102 ST elevation (STEMI) myocardial infarction involving left anterior descending coronary artery: Secondary | ICD-10-CM

## 2020-01-01 DIAGNOSIS — I4891 Unspecified atrial fibrillation: Secondary | ICD-10-CM | POA: Diagnosis not present

## 2020-01-01 DIAGNOSIS — Z7901 Long term (current) use of anticoagulants: Secondary | ICD-10-CM

## 2020-01-01 DIAGNOSIS — I236 Thrombosis of atrium, auricular appendage, and ventricle as current complications following acute myocardial infarction: Secondary | ICD-10-CM | POA: Diagnosis not present

## 2020-01-01 LAB — POCT INR: INR: 1.3 — AB (ref 2.0–3.0)

## 2020-01-01 NOTE — Patient Instructions (Signed)
-  NO GREENS TODAY - START NEW DOSAGE of 1 tablet daily except 2 tablets on MONDAYS, [place].  - Recheck INR in 1 week.   Coumadin Clinic [facility] [phone].

## 2020-01-02 ENCOUNTER — Other Ambulatory Visit (HOSPITAL_COMMUNITY): Payer: Self-pay | Admitting: Cardiology

## 2020-01-08 ENCOUNTER — Ambulatory Visit (INDEPENDENT_AMBULATORY_CARE_PROVIDER_SITE_OTHER): Payer: BC Managed Care – PPO

## 2020-01-08 ENCOUNTER — Other Ambulatory Visit: Payer: Self-pay

## 2020-01-08 ENCOUNTER — Telehealth: Payer: Self-pay

## 2020-01-08 DIAGNOSIS — I4891 Unspecified atrial fibrillation: Secondary | ICD-10-CM | POA: Diagnosis not present

## 2020-01-08 DIAGNOSIS — I236 Thrombosis of atrium, auricular appendage, and ventricle as current complications following acute myocardial infarction: Secondary | ICD-10-CM

## 2020-01-08 DIAGNOSIS — Z7901 Long term (current) use of anticoagulants: Secondary | ICD-10-CM | POA: Diagnosis not present

## 2020-01-08 DIAGNOSIS — I2102 ST elevation (STEMI) myocardial infarction involving left anterior descending coronary artery: Secondary | ICD-10-CM

## 2020-01-08 LAB — POCT INR: INR: 1.2 — AB (ref 2.0–3.0)

## 2020-01-08 MED ORDER — WARFARIN SODIUM 1 MG PO TABS
1.0000 mg | ORAL_TABLET | Freq: Every day | ORAL | 1 refills | Status: DC
Start: 2020-01-08 — End: 2020-01-09

## 2020-01-08 NOTE — Patient Instructions (Signed)
-  NO GREENS TODAY - START NEW DOSAGE 2 tablets every day - Recheck INR in 1 week.

## 2020-01-08 NOTE — Telephone Encounter (Signed)
Patient calling in stating a new Rx needs to be sent in to pharmacy ([facility] [address] in [place]) due to medication change this morning

## 2020-01-08 NOTE — Telephone Encounter (Signed)
Refill sent in as requested. 

## 2020-01-09 ENCOUNTER — Other Ambulatory Visit: Payer: Self-pay | Admitting: *Deleted

## 2020-01-09 MED ORDER — WARFARIN SODIUM 1 MG PO TABS
ORAL_TABLET | ORAL | 1 refills | Status: DC
Start: 2020-01-09 — End: 2020-02-02

## 2020-01-09 NOTE — Telephone Encounter (Signed)
Wife called and stated that the pt needs a refill and the pharmacy won't refill it and there was no prescription sent. Gathered all pertinent information. Called the pharmacy and advised that the pt needs 60 tabs of Warfarin and not the one that was sent in for 45 tablets yesterday as he is now taking 2 tablets daily as of yesterday. Also, asked if it was covered since wife reports they were told it is not, and he stated it went right through without any issue so it is. Called the wife and pt back and updated and they were pleased with the information and follow up and advised to call back if needed. They will go pick up the warfarin today.

## 2020-01-15 ENCOUNTER — Ambulatory Visit (INDEPENDENT_AMBULATORY_CARE_PROVIDER_SITE_OTHER): Payer: BC Managed Care – PPO

## 2020-01-15 ENCOUNTER — Encounter (HOSPITAL_COMMUNITY): Payer: Self-pay | Admitting: Cardiology

## 2020-01-15 ENCOUNTER — Other Ambulatory Visit: Payer: Self-pay

## 2020-01-15 ENCOUNTER — Ambulatory Visit (HOSPITAL_COMMUNITY)
Admission: RE | Admit: 2020-01-15 | Discharge: 2020-01-15 | Disposition: A | Discharge status: Home / Self Care | Payer: BC Managed Care – PPO | Source: Ambulatory Visit | Attending: Cardiology | Admitting: Cardiology

## 2020-01-15 VITALS — BP 160/100 | HR 83 | Wt 195.0 lb

## 2020-01-15 DIAGNOSIS — I2102 ST elevation (STEMI) myocardial infarction involving left anterior descending coronary artery: Secondary | ICD-10-CM | POA: Diagnosis not present

## 2020-01-15 DIAGNOSIS — N183 Chronic kidney disease, stage 3 unspecified: Secondary | ICD-10-CM | POA: Insufficient documentation

## 2020-01-15 DIAGNOSIS — I252 Old myocardial infarction: Secondary | ICD-10-CM | POA: Insufficient documentation

## 2020-01-15 DIAGNOSIS — I4891 Unspecified atrial fibrillation: Secondary | ICD-10-CM | POA: Diagnosis not present

## 2020-01-15 DIAGNOSIS — I130 Hypertensive heart and chronic kidney disease with heart failure and stage 1 through stage 4 chronic kidney disease, or unspecified chronic kidney disease: Secondary | ICD-10-CM | POA: Insufficient documentation

## 2020-01-15 DIAGNOSIS — Z955 Presence of coronary angioplasty implant and graft: Secondary | ICD-10-CM | POA: Insufficient documentation

## 2020-01-15 DIAGNOSIS — I236 Thrombosis of atrium, auricular appendage, and ventricle as current complications following acute myocardial infarction: Secondary | ICD-10-CM | POA: Diagnosis not present

## 2020-01-15 DIAGNOSIS — Z940 Kidney transplant status: Secondary | ICD-10-CM | POA: Diagnosis not present

## 2020-01-15 DIAGNOSIS — I2510 Atherosclerotic heart disease of native coronary artery without angina pectoris: Secondary | ICD-10-CM | POA: Insufficient documentation

## 2020-01-15 DIAGNOSIS — Z79899 Other long term (current) drug therapy: Secondary | ICD-10-CM | POA: Insufficient documentation

## 2020-01-15 DIAGNOSIS — I480 Paroxysmal atrial fibrillation: Secondary | ICD-10-CM | POA: Insufficient documentation

## 2020-01-15 DIAGNOSIS — Z7902 Long term (current) use of antithrombotics/antiplatelets: Secondary | ICD-10-CM | POA: Diagnosis not present

## 2020-01-15 DIAGNOSIS — Z833 Family history of diabetes mellitus: Secondary | ICD-10-CM | POA: Diagnosis not present

## 2020-01-15 DIAGNOSIS — Z7901 Long term (current) use of anticoagulants: Secondary | ICD-10-CM

## 2020-01-15 DIAGNOSIS — Z7984 Long term (current) use of oral hypoglycemic drugs: Secondary | ICD-10-CM | POA: Diagnosis not present

## 2020-01-15 DIAGNOSIS — I255 Ischemic cardiomyopathy: Secondary | ICD-10-CM | POA: Diagnosis not present

## 2020-01-15 DIAGNOSIS — I5022 Chronic systolic (congestive) heart failure: Secondary | ICD-10-CM | POA: Diagnosis not present

## 2020-01-15 DIAGNOSIS — E1122 Type 2 diabetes mellitus with diabetic chronic kidney disease: Secondary | ICD-10-CM | POA: Diagnosis not present

## 2020-01-15 LAB — COMPREHENSIVE METABOLIC PANEL
ALT: 22 U/L (ref 0–44)
AST: 17 U/L (ref 15–41)
Albumin: 3.2 g/dL — ABNORMAL LOW (ref 3.5–5.0)
Alkaline Phosphatase: 48 U/L (ref 38–126)
Anion gap: 9 (ref 5–15)
BUN: 23 mg/dL — ABNORMAL HIGH (ref 6–20)
CO2: 20 mmol/L — ABNORMAL LOW (ref 22–32)
Calcium: 9.6 mg/dL (ref 8.9–10.3)
Chloride: 107 mmol/L (ref 98–111)
Creatinine, Ser: 2.1 mg/dL — ABNORMAL HIGH (ref 0.61–1.24)
GFR, Estimated: 36 mL/min — ABNORMAL LOW (ref >60)
Glucose, Bld: 300 mg/dL — ABNORMAL HIGH (ref 70–99)
Potassium: 3.7 mmol/L (ref 3.5–5.1)
Sodium: 136 mmol/L (ref 135–145)
Total Bilirubin: 0.4 mg/dL (ref 0.3–1.2)
Total Protein: 6 g/dL — ABNORMAL LOW (ref 6.5–8.1)

## 2020-01-15 LAB — POCT INR: INR: 1.3 — AB (ref 2.0–3.0)

## 2020-01-15 MED ORDER — CARVEDILOL 6.25 MG PO TABS
6.2500 mg | ORAL_TABLET | Freq: Two times a day (BID) | ORAL | 3 refills | Status: DC
Start: 2020-01-15 — End: 2020-01-22

## 2020-01-15 MED ORDER — ISOSORB DINITRATE-HYDRALAZINE 20-37.5 MG PO TABS
2.0000 | ORAL_TABLET | Freq: Three times a day (TID) | ORAL | 6 refills | Status: DC
Start: 2020-01-15 — End: 2021-01-30

## 2020-01-15 MED ORDER — DAPAGLIFLOZIN PROPANEDIOL 10 MG PO TABS: 10.0000 mg | ORAL_TABLET | Freq: Every day | ORAL | 3 refills | Status: DC

## 2020-01-15 MED ORDER — ISOSORB DINITRATE-HYDRALAZINE 20-37.5 MG PO TABS
2.0000 | ORAL_TABLET | Freq: Three times a day (TID) | ORAL | 6 refills | Status: DC
Start: 2020-01-15 — End: 2020-01-15

## 2020-01-15 NOTE — Patient Instructions (Signed)
-   START NEW DOSAGE 3 tablets every day - Recheck INR in 1 week.   Coumadin Clinic [facility] [phone].

## 2020-01-15 NOTE — Progress Notes (Signed)
PCP: Dion Body, MD Cardiology: Dr. Aundra Dubin  59 y.o. with history of renal transplant in 2011, CAD s/p anterior MI, and ischemic cardiomyopathy presents for followup of CHF and CAD.  Patient had his renal transplant at [facility], and has been followed by [facility] as well as [facility] since that time.  He had been doing well until 10/21.  In early 10/21, he developed chest pain.  He did not go immediately to the ER, he presented when the pain had continued for > 1 day.  He was found to have acute anterior MI with late presentation. He went for cath, LAD was occluded and there was severe diffuse disease in the RCA as well as severe disease in the LCx system.  He had DES to the LAD.  He subsequently developed cardiogenic shock as well as suspected septic shock, possible from gut source.  Abdominal imaging showed profound ileus.  He was taken to the OR for Impella 5.5 placement, but this was deferred due to the finding of LV thrombus on intra-op TEE.  He also developed atrial fibrillation with RVR, controlled by amiodarone and eventually converted back to NSR.  He was maintained on milrinone 0.25 with gradual improvement.  However, he developed AKI, likely due to contrast as well as cardiorenal. Creatinine went up to 5.  Creatinine gradually improved down to 3.38 at discharge, and we were able to discontinue milrinone.  Ileus also gradually resolved.  Echo in the hospital showed EF 20-25%.  He is wearing a Lifevest.   Patient returns for followup.  He has a rash from the Lifevest that itches.  He has not been using torsemide.  Weight is up but now wearing winter clothing.  He has been doing well symptomatically.  Walking in stores, walks up to 1/2 mile at a time without dyspnea.  Ok with stairs.  No orthopnea/PND.  BP elevated today. No palpitations, he is in NSR today.  Still doing home PT. No BRBPR/melena.   ECG (personally reviewed): NSR, old anterior MI, old inferior MI  Labs (10/21): K 4.3,  creatinine 3.38 Labs (11/21): K 4.7, creatinine 2.24, LDL 68, HDL 43, hgb 11.9, LFTs normal  PMH: 1. Type 2 diabetes 2. Atrial fibrillation: Paroxysmal, on amiodarone.  3. Renal transplant 2011 [facility].  4. GERD 5. 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 CAD: Late presentation anterior MI in 10/21.  Cath with occluded LAD, long up to 99% mid-distal RCA stenosis, 95% mid-distal LCx, 80% OM3.  He had DES to LAD.   8. Chronic systolic CHF: Ischemic cardiomyopathy. Cardiogenic shock in 10/21 with MI.  - Echo (10/21): EF 20-25%, heavy smoke/early thrombus LV apex, RV normal.  - RHC (10/21, milrinone 0.25): mean RA 4, PA 22/4, mean PCWP 6, CI 2.06 Fick, CI 2.06 thermo.  9. LV thrombus  SH: Married, nonsmoker, no ETOH.  Lives in [place]. 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   Family History  Problem Relation Age of Onset  . Diabetes Mother   . Hyperlipidemia Mother   . Cancer Father    ROS: all systems reviewed and negative except as per HPI.   Current Outpatient Medications  Medication Sig Dispense Refill  . amiodarone (PACERONE) 200 MG tablet Take 1 tablet (200 mg total) by mouth daily. 30 tablet 6  . carvedilol (COREG) 6.25 MG tablet Take 1 tablet (6.25 mg total) by mouth 2 (two) times daily. 30 tablet 3  . clopidogrel (PLAVIX) 75 MG tablet Take 1 tablet (75 mg  total) by mouth daily. 30 tablet 6  . glipiZIDE (GLUCOTROL) 10 MG tablet Take 10 mg by mouth 2 (two) times daily before a meal.    . isosorbide-hydrALAZINE (BIDIL) 20-37.5 MG tablet Take 2 tablets by mouth 3 (three) times daily. 180 tablet 6  . loratadine (CLARITIN) 10 MG tablet Take 10 mg by mouth daily as needed for allergies.    . nitroGLYCERIN (NITROSTAT) 0.4 MG SL tablet Place 1 tablet (0.4 mg total) under the tongue every 5 (five) minutes as needed for chest pain. 100 tablet 3  . pantoprazole (PROTONIX) 40 MG tablet Take 1 tablet (40 mg total) by mouth daily. 30 tablet 6  . predniSONE (DELTASONE) 5 MG tablet Take 1 tablet (5 mg  total) by mouth daily with breakfast.    . rosuvastatin (CRESTOR) 40 MG tablet Take 1 tablet (40 mg total) by mouth daily. 30 tablet 6  . simethicone (MYLICON) 500 MG chewable tablet Chew 125 mg by mouth every 6 (six) hours as needed for flatulence.    . tacrolimus (PROGRAF) 1 MG capsule Take 4 tablets  By mouth every morning and 3 tablets by mouth every evening    . torsemide (DEMADEX) 20 MG tablet Take 1 tablet (20 mg total) by mouth as needed. 3 pound weight gain in 24 hours 30 tablet 6  . warfarin (COUMADIN) 1 MG tablet Take 2 tablets daily or as directed by Anticoagulation Clinic. (Patient taking differently: Take 3 tablets daily or as directed by Anticoagulation Clinic.) 60 tablet 1  . dapagliflozin propanediol (FARXIGA) 10 MG TABS tablet Take 1 tablet (10 mg total) by mouth daily before breakfast. 90 tablet 3   No current facility-administered medications for this encounter.   BP (!) 160/100   Pulse 83   Wt 88.5 kg (195 lb)   SpO2 98%   BMI 25.73 kg/m  General: NAD Neck: No JVD, no thyromegaly or thyroid nodule.  Lungs: Clear to auscultation bilaterally with normal respiratory effort. CV: Nondisplaced PMI.  Heart regular S1/S2, no S3/S4, no murmur.  No peripheral edema.  No carotid bruit.  Normal pedal pulses.  Abdomen: Soft, nontender, no hepatosplenomegaly, no distention.  Skin: Intact without lesions or rashes.  Neurologic: Alert and oriented x 3.  Psych: Normal affect. Extremities: No clubbing or cyanosis.  HEENT: Normal.   Assessment/Plan: 1. CAD: S/p late presentation anterior MI in 10/21 with DES to LAD.  He has residual severe disease in the RCA which is not revascularizable.   He has 95% mid-distal LCx stenosis and 80% OM3 stenosis that could potentially be intervened upon.  No chest pain.  - Refer for cardiac rehab at [facility], start when finished with home PT.  - Continue Plavix for up to 1 year post-PCI.  He is now off ASA as he is also on warfarin.  Can stop Plavix at 6  months if he develops issues with bleeding.   - Continue statin, good lipids in 11/21.  - If creatinine decreases back to baseline around 1, will arrange for PCI to LCx system.  For now, medical management.  2. Atrial fibrillation: Paroxysmal.  He is in NSR today.  - Continue amiodarone 200 mg daily.  LFTs/TSH normal recently, will need regular eye exam.  - Continue warfarin. CBC has been stable.  - Consider eventual atrial fibrillation ablation. Will have him seen by EP for this but will wait to see if he needs ICD also.  3. Chronic systolic CHF: Ischemic cardiomyopathy.  Echo in 10/21 with  EF 20-25%.  Not volume overloaded on exam, NYHA class II.  - He can continue to use torsemide prn.  - Increase Bidil to 2 tabs tid.  - Increase Coreg to 6.25 mg bid.  - Will use ARNI and spironolactone if creatinine improved.  - Add Farxiga 10 mg daily (GFR > 25).  Check BMET in 10 days.   - Continue Lifevest for now.  Can use hydrocortisone cream for rash, can take Benedryl in the evening. Will ask Lifevest rep if we have other options for skin contact.  - Needs echo at 3 months for ?ICD (1/22).  4. LV thrombus: Early thrombus in LV in setting of low EF.  - Now on warfarin.  5. 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  6. Type 2 diabetes: Can decrease glipizide to 10 mg bid with addition of [place].   Followup with HF pharmacist in 3 wks for medication titration (?spironolactone, maybe Entresto if creatinine has significantly improved). See pharmacist for a couple of visits, then see me in 6 wks.    Loralie Champagne 01/15/2020 11:05 PM

## 2020-01-15 NOTE — Patient Instructions (Addendum)
DECREASE Glipizide to 10mg  twice daily  INCREASE Bidil to 2 tablets twice daily  INCREASE Carvedilol to 6.25mg  twice daily  START Farxiga 10mg  daily  Routine lab work today. Will notify you of abnormal results  Repeat labs in 10 days  You have been referred to Cardiac Rehab at [facility]  (they will contact you to schedule an appointment)  Follow up with Pharm D in 3 weeks  Follow up with Dr.McLean in 6 weeks   Please call [employer] customer service to report skin irritation  [phone]

## 2020-01-22 ENCOUNTER — Other Ambulatory Visit: Payer: Self-pay

## 2020-01-22 ENCOUNTER — Other Ambulatory Visit (HOSPITAL_COMMUNITY): Payer: Self-pay | Admitting: Cardiology

## 2020-01-22 ENCOUNTER — Ambulatory Visit (INDEPENDENT_AMBULATORY_CARE_PROVIDER_SITE_OTHER): Payer: BC Managed Care – PPO

## 2020-01-22 DIAGNOSIS — Z7901 Long term (current) use of anticoagulants: Secondary | ICD-10-CM

## 2020-01-22 DIAGNOSIS — I236 Thrombosis of atrium, auricular appendage, and ventricle as current complications following acute myocardial infarction: Secondary | ICD-10-CM | POA: Diagnosis not present

## 2020-01-22 DIAGNOSIS — I2102 ST elevation (STEMI) myocardial infarction involving left anterior descending coronary artery: Secondary | ICD-10-CM | POA: Diagnosis not present

## 2020-01-22 DIAGNOSIS — I4891 Unspecified atrial fibrillation: Secondary | ICD-10-CM

## 2020-01-22 LAB — POCT INR: INR: 2.2 (ref 2.0–3.0)

## 2020-01-22 NOTE — Patient Instructions (Signed)
-   continue 3 tablets every day - Recheck INR in 1 week.   Coumadin Clinic [facility] [phone].

## 2020-01-24 NOTE — Progress Notes (Incomplete)
***In Progress*** PCP: Dion Body, MD Cardiology: Dr. Aundra Dubin  HPI:  59 y.o. with history of renal transplant in 2011, CAD s/p anterior MI, and ischemic cardiomyopathy presents for followup of CHF and CAD.  Patient had his renal transplant at [facility], and has been followed by [facility] as well as [facility] since that time.  He had been doing well until 10/21.  In early 10/21, he developed chest pain.  He did not go immediately to the ER, he presented when the pain had continued for > 1 day.  He was found to have acute anterior MI with late presentation. He went for cath, LAD was occluded and there was severe diffuse disease in the RCA as well as severe disease in the LCx system.  He had DES to the LAD.  He subsequently developed cardiogenic shock as well as suspected septic shock, possible from gut source.  Abdominal imaging showed profound ileus.  He was taken to the OR for Impella 5.5 placement, but this was deferred due to the finding of LV thrombus on intra-op TEE.  He also developed atrial fibrillation with RVR, controlled by amiodarone and eventually converted back to NSR.  He was maintained on milrinone 0.25 with gradual improvement.  However, he developed AKI, likely due to contrast as well as cardiorenal. Creatinine went up to 5.  Creatinine gradually improved down to 3.38 at discharge, and we were able to discontinue milrinone.  Ileus also gradually resolved.  Echo in the hospital showed EF 20-25%.  He is wearing a Lifevest.   Patient recently returned to [facility] for followup on 01/15/20.  He had a rash from the Lifevest that itched.  He had not been using torsemide.  Weight was up but now wearing winter clothing.  He had been doing well symptomatically.  Reported walking in stores and could walk up to 1/2 mile at a time without dyspnea.  Ok with stairs.  No orthopnea/PND.  BP was elevated in clinic. No palpitations, he was in NSR in clinic.  Still doing home PT. No BRBPR/melena.    Today he returns to HF clinic for pharmacist medication titration. At last visit with MD carvedilol was increased to 6.25 mg BID, Bidil was increased to 2 tablets TID and Farxiga 10 mg daily was initiated.   . Shortness of breath/dyspnea on exertion? {YES [id number]  . Orthopnea/PND? {YES [id number] . Edema? {YES [id number] . Lightheadedness/dizziness? {YES [id number] . Daily weights at home? {YES [id number] . Blood pressure/heart rate monitoring at home? {YES [id number] . Following low-sodium/fluid-restricted diet? {YES NO:22349}  HF Medications: Carvedilol 6.25 mg BID Farxiga 10 mg daily Bidil 20-37.5 mg 2 tablets TID Torsemide 20 mg PRN  Has the patient been experiencing any side effects to the medications prescribed?  {YES NO:22349}  Does the patient have any problems obtaining medications due to transportation or finances?   {YES NO:22349}  Understanding of regimen: {excellent/good/fair/poor:19665} Understanding of indications: {excellent/good/fair/poor:19665} Potential of compliance: {excellent/good/fair/poor:19665} Patient understands to avoid NSAIDs. Patient understands to avoid decongestants.    Pertinent Lab Values: . Serum creatinine ***, BUN ***, Potassium ***, Sodium ***, BNP ***, Magnesium ***, Digoxin ***   Vital Signs: . Weight: *** (last clinic weight: ***) . Blood pressure: ***  . Heart rate: ***   Assessment: 1. CAD: S/p late presentation anterior MI in 10/21 with DES to LAD.  He has residual severe disease in the RCA which is not revascularizable.   He has 95% mid-distal LCx stenosis and 80% OM3 stenosis  that could potentially be intervened upon.  No chest pain.  - Referred for cardiac rehab at [facility] - Continue Plavix for up to 1 year post-PCI.  He is now off ASA as he is also on warfarin.  Can stop Plavix at 6 months if he develops issues with bleeding.   - Continue statin, good lipids in 11/21.  - If creatinine decreases back to baseline around 1, will  arrange for PCI to LCx system.  For now, medical management.  2. Atrial fibrillation: Paroxysmal.   - Continue amiodarone 200 mg daily.  LFTs/TSH normal recently, will need regular eye exam.  - Continue warfarin. - Consider eventual atrial fibrillation ablation. Will have him seen by EP for this but will wait to see if he needs ICD also.  3. Chronic systolic CHF: Ischemic cardiomyopathy.  Echo in 10/21 with EF 20-25%.   - Not volume overloaded on exam, NYHA class II.  - Continue torsemide 20 mg PRN.  - Continue Carvedilol 6.25 mg BID. - Continue Bidil 2 tabs TID.   - Continue Farxiga 10 mg daily (GFR > 25).  - Will use ARNI and spironolactone if creatinine improves.   - Continue Lifevest for now.  Can use hydrocortisone cream for rash, can take Benadryl in the evening. Will ask Lifevest rep if we have other options for skin contact.  - Needs echo at 3 months for ?ICD (1/22).  4. LV thrombus: Early thrombus in LV in setting of low EF.  - Now on warfarin.  5. 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  6. Type 2 diabetes: Continue Farxiga and glipizide   Plan: 1) Medication changes: Based on clinical presentation, vital signs and recent labs will *** 2) Labs: *** 3) Follow-up: ***   Audry Riles, PharmD, BCPS, BCCP, CPP Heart Failure Clinic Pharmacist [phone]

## 2020-01-25 ENCOUNTER — Other Ambulatory Visit: Payer: Self-pay

## 2020-01-25 ENCOUNTER — Ambulatory Visit (HOSPITAL_COMMUNITY)
Admission: RE | Admit: 2020-01-25 | Discharge: 2020-01-25 | Disposition: A | Discharge status: Home / Self Care | Payer: BC Managed Care – PPO | Source: Ambulatory Visit | Attending: Internal Medicine | Admitting: Internal Medicine

## 2020-01-25 DIAGNOSIS — I5022 Chronic systolic (congestive) heart failure: Secondary | ICD-10-CM | POA: Diagnosis present

## 2020-01-25 LAB — BASIC METABOLIC PANEL
Anion gap: 10 (ref 5–15)
BUN: 30 mg/dL — ABNORMAL HIGH (ref 6–20)
CO2: 21 mmol/L — ABNORMAL LOW (ref 22–32)
Calcium: 9.5 mg/dL (ref 8.9–10.3)
Chloride: 110 mmol/L (ref 98–111)
Creatinine, Ser: 2.43 mg/dL — ABNORMAL HIGH (ref 0.61–1.24)
GFR, Estimated: 30 mL/min — ABNORMAL LOW (ref >60)
Glucose, Bld: 203 mg/dL — ABNORMAL HIGH (ref 70–99)
Potassium: 3.9 mmol/L (ref 3.5–5.1)
Sodium: 141 mmol/L (ref 135–145)

## 2020-01-26 ENCOUNTER — Encounter: Payer: BC Managed Care – PPO | Attending: Cardiology | Admitting: *Deleted

## 2020-01-26 DIAGNOSIS — Z955 Presence of coronary angioplasty implant and graft: Secondary | ICD-10-CM | POA: Insufficient documentation

## 2020-01-26 DIAGNOSIS — I213 ST elevation (STEMI) myocardial infarction of unspecified site: Secondary | ICD-10-CM

## 2020-01-26 DIAGNOSIS — I2102 ST elevation (STEMI) myocardial infarction involving left anterior descending coronary artery: Secondary | ICD-10-CM | POA: Insufficient documentation

## 2020-01-26 NOTE — Progress Notes (Signed)
Initial telephone orientation completed. Diagnosis can be found in [facility] 10/19. EP orientation scheduled for Tuesday 12/14 at 10am.

## 2020-01-29 ENCOUNTER — Ambulatory Visit (INDEPENDENT_AMBULATORY_CARE_PROVIDER_SITE_OTHER): Payer: BC Managed Care – PPO

## 2020-01-29 ENCOUNTER — Other Ambulatory Visit: Payer: Self-pay

## 2020-01-29 DIAGNOSIS — I4891 Unspecified atrial fibrillation: Secondary | ICD-10-CM

## 2020-01-29 DIAGNOSIS — Z7901 Long term (current) use of anticoagulants: Secondary | ICD-10-CM

## 2020-01-29 DIAGNOSIS — I236 Thrombosis of atrium, auricular appendage, and ventricle as current complications following acute myocardial infarction: Secondary | ICD-10-CM | POA: Diagnosis not present

## 2020-01-29 DIAGNOSIS — I2102 ST elevation (STEMI) myocardial infarction involving left anterior descending coronary artery: Secondary | ICD-10-CM | POA: Diagnosis not present

## 2020-01-29 LAB — POCT INR: INR: 1.7 — AB (ref 2.0–3.0)

## 2020-01-29 NOTE — Patient Instructions (Signed)
-   START NEW DOSAGE of warfarin 3 tablets every day EXCEPT 4 tablets on MONDAYS, [place]. - Recheck INR in 1 week.   Coumadin Clinic [facility] [phone].

## 2020-01-30 ENCOUNTER — Encounter: Payer: BC Managed Care – PPO | Admitting: *Deleted

## 2020-01-30 VITALS — Ht 73.0 in | Wt 195.7 lb

## 2020-01-30 DIAGNOSIS — I213 ST elevation (STEMI) myocardial infarction of unspecified site: Secondary | ICD-10-CM

## 2020-01-30 DIAGNOSIS — Z955 Presence of coronary angioplasty implant and graft: Secondary | ICD-10-CM

## 2020-01-30 DIAGNOSIS — I2102 ST elevation (STEMI) myocardial infarction involving left anterior descending coronary artery: Secondary | ICD-10-CM | POA: Diagnosis present

## 2020-01-30 NOTE — Progress Notes (Signed)
Cardiac Individual Treatment Plan  Patient Details  Name: Casey [name]. MRN: [id number] Date of Birth: [birth year] Referring Provider:   Flowsheet Row Cardiac Rehab from 01/30/2020 in [facility] Cardiac and Pulmonary Rehab  Referring Provider Loralie Champagne MD      Initial Encounter Date:  Flowsheet Row Cardiac Rehab from 01/30/2020 in [facility] Cardiac and Pulmonary Rehab  Date 01/30/20      Visit Diagnosis: ST elevation myocardial infarction (STEMI), unspecified artery [facility])  Status post coronary artery stent placement  Patient's Home Medications on Admission:  Current Outpatient Medications:  .  amiodarone (PACERONE) 200 MG tablet, Take 1 tablet (200 mg total) by mouth daily., Disp: 30 tablet, Rfl: 6 .  carvedilol (COREG) 6.25 MG tablet, TAKE 1 TABLET BY MOUTH TWICE A DAY, Disp: 30 tablet, Rfl: 3 .  clopidogrel (PLAVIX) 75 MG tablet, Take 1 tablet (75 mg total) by mouth daily., Disp: 30 tablet, Rfl: 6 .  dapagliflozin propanediol (FARXIGA) 10 MG TABS tablet, Take 1 tablet (10 mg total) by mouth daily before breakfast., Disp: 90 tablet, Rfl: 3 .  glipiZIDE (GLUCOTROL) 10 MG tablet, Take 10 mg by mouth 2 (two) times daily before a meal., Disp: , Rfl:  .  isosorbide-hydrALAZINE (BIDIL) 20-37.5 MG tablet, Take 2 tablets by mouth 3 (three) times daily., Disp: 180 tablet, Rfl: 6 .  loratadine (CLARITIN) 10 MG tablet, Take 10 mg by mouth daily as needed for allergies. (Patient not taking: Reported on 01/26/2020), Disp: , Rfl:  .  nitroGLYCERIN (NITROSTAT) 0.4 MG SL tablet, Place 1 tablet (0.4 mg total) under the tongue every 5 (five) minutes as needed for chest pain., Disp: 100 tablet, Rfl: 3 .  pantoprazole (PROTONIX) 40 MG tablet, Take 1 tablet (40 mg total) by mouth daily., Disp: 30 tablet, Rfl: 6 .  predniSONE (DELTASONE) 5 MG tablet, Take 1 tablet (5 mg total) by mouth daily with breakfast., Disp: , Rfl:  .  rosuvastatin (CRESTOR) 40 MG tablet, Take 1 tablet (40 mg total) by mouth daily.,  Disp: 30 tablet, Rfl: 6 .  simethicone (MYLICON) 034 MG chewable tablet, Chew 125 mg by mouth every 6 (six) hours as needed for flatulence., Disp: , Rfl:  .  tacrolimus (PROGRAF) 1 MG capsule, Take 4 tablets  By mouth every morning and 3 tablets by mouth every evening, Disp: , Rfl:  .  torsemide (DEMADEX) 20 MG tablet, Take 1 tablet (20 mg total) by mouth as needed. 3 pound weight gain in 24 hours, Disp: 30 tablet, Rfl: 6 .  warfarin (COUMADIN) 1 MG tablet, Take 2 tablets daily or as directed by Anticoagulation Clinic. (Patient taking differently: Take 3 tablets daily or as directed by Anticoagulation Clinic.), Disp: 60 tablet, Rfl: 1  Past Medical History: Past Medical History:  Diagnosis Date  . CHF (congestive heart failure) ([place])   . Chronic kidney disease 04/2009   Kidney Transplant  . Diabetes mellitus   . GERD (gastroesophageal reflux disease)    as needed reflux  . Heart attack ([place]) 02/02/2020  . Hypertension   . 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    Tobacco Use: Social History   Tobacco Use  Smoking Status Never Smoker  Smokeless Tobacco Never Used    Labs: Recent Review Flowsheet Data    Labs for ITP Cardiac and Pulmonary Rehab Latest Ref Rng & Units 12/12/2019 12/13/2019 12/14/2019 12/15/2019 12/26/2019   Cholestrol 0 - 200 mg/dL - - - - 147  LDLCALC 0 - 99 mg/dL - - - - 68   HDL >40 mg/dL - - - - 43   Trlycerides <150 mg/dL - - - - 181(H)   Hemoglobin A1c 4.8 - 5.6 % - - - - -   PHART 7.350 - 7.450 - - - - -   PCO2ART 32.0 - 48.0 mmHg - - - - -   HCO3 20.0 - 28.0 mmol/L - - - - -   TCO2 22 - 32 mmol/L - - - - -   ACIDBASEDEF 0.0 - 2.0 mmol/L - - - - -   O2SAT % 65.2 70.7 68.9 72.2 -       Exercise Target Goals: Exercise Program Goal: Individual exercise prescription set using results from initial 6 min walk test and THRR while considering  patient's activity barriers and safety.   Exercise Prescription Goal: Initial  exercise prescription builds to 30-45 minutes a day of aerobic activity, 2-3 days per week.  Home exercise guidelines will be given to patient during program as part of exercise prescription that the participant will acknowledge.   Education: Aerobic Exercise: - Group verbal and visual presentation on the components of exercise prescription. Introduces F.I.T.T principle from ACSM for exercise prescriptions.  Reviews F.I.T.T. principles of aerobic exercise including progression. Written material given at graduation. Flowsheet Row Cardiac Rehab from 01/30/2020 in [facility] Cardiac and Pulmonary Rehab  Education need identified 01/30/20      Education: Resistance Exercise: - Group verbal and visual presentation on the components of exercise prescription. Introduces F.I.T.T principle from ACSM for exercise prescriptions  Reviews F.I.T.T. principles of resistance exercise including progression. Written material given at graduation.    Education: Exercise & Equipment Safety: - Individual verbal instruction and demonstration of equipment use and safety with use of the equipment. Flowsheet Row Cardiac Rehab from 01/30/2020 in [facility] Cardiac and Pulmonary Rehab  Date 01/30/20  Educator [facility]  Instruction Review Code 1- Verbalizes Understanding      Education: Exercise Physiology & General Exercise Guidelines: - Group verbal and written instruction with models to review the exercise physiology of the cardiovascular system and associated critical values. Provides general exercise guidelines with specific guidelines to those with heart or lung disease.    Education: Flexibility, Balance, Mind/Body Relaxation: - Group verbal and visual presentation with interactive activity on the components of exercise prescription. Introduces F.I.T.T principle from ACSM for exercise prescriptions. Reviews F.I.T.T. principles of flexibility and balance exercise training including progression. Also discusses the mind body  connection.  Reviews various relaxation techniques to help reduce and manage stress (i.e. Deep breathing, progressive muscle relaxation, and visualization). Balance handout provided to take home. Written material given at graduation.   Activity Barriers & Risk Stratification:  Activity Barriers & Cardiac Risk Stratification - 01/30/20 1527      Activity Barriers & Cardiac Risk Stratification   Activity Barriers Other (comment);Deconditioning    Comments Life Vest; Renal Transplant, L shoulder tingly    Cardiac Risk Stratification High           6 Minute Walk:  6 Minute Walk    Row Name 01/30/20 1526         6 Minute Walk   Phase Initial     Distance 1230 feet     Walk Time 6 minutes     # of Rest Breaks 0     MPH 2.33     METS 3.71     RPE 9     VO2 Peak  12.97     Symptoms No     Resting HR 75 bpm     Resting BP 128/70     Resting Oxygen Saturation  97 %     Exercise Oxygen Saturation  during 6 min walk 96 %     Max Ex. HR 101 bpm     Max Ex. BP 146/64     2 Minute Post BP 126/64            Oxygen Initial Assessment:   Oxygen Re-Evaluation:   Oxygen Discharge (Final Oxygen Re-Evaluation):   Initial Exercise Prescription:  Initial Exercise Prescription - 01/30/20 1500      Date of Initial Exercise RX and Referring Provider   Date 01/30/20    Referring Provider Loralie Champagne MD      Treadmill   MPH 2.3    Grade 1    Minutes 15    METs 3.08      NuStep   Level 4    SPM 80    Minutes 15    METs 3      Elliptical   Level 1    Speed 3.5    Minutes 15    METs 3      Biostep-RELP   Level 4    SPM 50    Minutes 15    METs 3      Prescription Details   Frequency (times per week) 2    Duration Progress to 30 minutes of continuous aerobic without signs/symptoms of physical distress      Intensity   THRR 40-80% of Max Heartrate 109-144    Ratings of Perceived Exertion 11-13    Perceived Dyspnea 0-4      Progression   Progression  Continue to progress workloads to maintain intensity without signs/symptoms of physical distress.      Resistance Training   Training Prescription Yes    Weight 4 lb    Reps 10-15           Perform Capillary Blood Glucose checks as needed.  Exercise Prescription Changes:  Exercise Prescription Changes    Row Name 01/30/20 1500             Response to Exercise   Blood Pressure (Admit) 128/70       Blood Pressure (Exercise) 146/64       Blood Pressure (Exit) 126/64       Heart Rate (Admit) 75 bpm       Heart Rate (Exercise) 101 bpm       Heart Rate (Exit) 76 bpm       Oxygen Saturation (Admit) 97 %       Oxygen Saturation (Exercise) 96 %       Rating of Perceived Exertion (Exercise) 9       Symptoms none       Comments walk test results              Exercise Comments:   Exercise Goals and Review:  Exercise Goals    Row Name 01/30/20 1529             Exercise Goals   Increase Physical Activity Yes       Intervention Provide advice, education, support and counseling about physical activity/exercise needs.;Develop an individualized exercise prescription for aerobic and resistive training based on initial evaluation findings, risk stratification, comorbidities and participant's personal goals.       Expected Outcomes Short Term: Attend rehab on a regular basis  to increase amount of physical activity.;Long Term: Add in home exercise to make exercise part of routine and to increase amount of physical activity.;Long Term: Exercising regularly at least 3-5 days a week.       Increase Strength and Stamina Yes       Intervention Provide advice, education, support and counseling about physical activity/exercise needs.;Develop an individualized exercise prescription for aerobic and resistive training based on initial evaluation findings, risk stratification, comorbidities and participant's personal goals.       Expected Outcomes Short Term: Increase workloads from initial  exercise prescription for resistance, speed, and METs.;Short Term: Perform resistance training exercises routinely during rehab and add in resistance training at home;Long Term: Improve cardiorespiratory fitness, muscular endurance and strength as measured by increased METs and functional capacity (6MWT)       Able to understand and use rate of perceived exertion (RPE) scale Yes       Intervention Provide education and explanation on how to use RPE scale       Expected Outcomes Long Term:  Able to use RPE to guide intensity level when exercising independently;Short Term: Able to use RPE daily in rehab to express subjective intensity level       Able to understand and use Dyspnea scale Yes       Intervention Provide education and explanation on how to use Dyspnea scale       Expected Outcomes Long Term: Able to use Dyspnea scale to guide intensity level when exercising independently;Short Term: Able to use Dyspnea scale daily in rehab to express subjective sense of shortness of breath during exertion       Knowledge and understanding of Target Heart Rate Range (THRR) Yes       Intervention Provide education and explanation of THRR including how the numbers were predicted and where they are located for reference       Expected Outcomes Short Term: Able to state/look up THRR;Short Term: Able to use daily as guideline for intensity in rehab;Long Term: Able to use THRR to govern intensity when exercising independently       Able to check pulse independently Yes       Intervention Provide education and demonstration on how to check pulse in carotid and radial arteries.;Review the importance of being able to check your own pulse for safety during independent exercise       Expected Outcomes Short Term: Able to explain why pulse checking is important during independent exercise;Long Term: Able to check pulse independently and accurately       Understanding of Exercise Prescription Yes       Intervention  Provide education, explanation, and written materials on patient's individual exercise prescription       Expected Outcomes Short Term: Able to explain program exercise prescription;Long Term: Able to explain home exercise prescription to exercise independently              Exercise Goals Re-Evaluation :   Discharge Exercise Prescription (Final Exercise Prescription Changes):  Exercise Prescription Changes - 01/30/20 1500      Response to Exercise   Blood Pressure (Admit) 128/70    Blood Pressure (Exercise) 146/64    Blood Pressure (Exit) 126/64    Heart Rate (Admit) 75 bpm    Heart Rate (Exercise) 101 bpm    Heart Rate (Exit) 76 bpm    Oxygen Saturation (Admit) 97 %    Oxygen Saturation (Exercise) 96 %    Rating of Perceived Exertion (Exercise) 9  Symptoms none    Comments walk test results           Nutrition:  Target Goals: Understanding of nutrition guidelines, daily intake of sodium 1500mg , cholesterol 200mg , calories 30% from fat and 7% or less from saturated fats, daily to have 5 or more servings of fruits and vegetables.  Education: All About Nutrition: -Group instruction provided by verbal, written material, interactive activities, discussions, models, and posters to present general guidelines for heart healthy nutrition including fat, fiber, MyPlate, the role of sodium in heart healthy nutrition, utilization of the nutrition label, and utilization of this knowledge for meal planning. Follow up email sent as well. Written material given at graduation.   Biometrics:  Pre Biometrics - 01/30/20 1530      Pre Biometrics   Height 6\' 1"  (1.854 m)    Weight 195 lb 11.2 oz (88.8 kg)    BMI (Calculated) 25.83    Single Leg Stand 30 seconds            Nutrition Therapy Plan and Nutrition Goals:   Nutrition Assessments:  MEDIFICTS Score Key:  ?70 Need to make dietary changes   40-70 Heart Healthy Diet  ? 40 Therapeutic Level Cholesterol  Diet  Flowsheet Row Cardiac Rehab from 01/30/2020 in [facility] Cardiac and Pulmonary Rehab  Picture Your Plate Total Score on Admission 80     Picture Your Plate Scores:  <47 Unhealthy dietary pattern with much room for improvement.  41-50 Dietary pattern unlikely to meet recommendations for good health and room for improvement.  51-60 More healthful dietary pattern, with some room for improvement.   >60 Healthy dietary pattern, although there may be some specific behaviors that could be improved.    Nutrition Goals Re-Evaluation:   Nutrition Goals Discharge (Final Nutrition Goals Re-Evaluation):   Psychosocial: Target Goals: Acknowledge presence or absence of significant depression and/or stress, maximize coping skills, provide positive support system. Participant is able to verbalize types and ability to use techniques and skills needed for reducing stress and depression.   Education: Stress, Anxiety, and Depression - Group verbal and visual presentation to define topics covered.  Reviews how body is impacted by stress, anxiety, and depression.  Also discusses healthy ways to reduce stress and to treat/manage anxiety and depression.  Written material given at graduation.   Education: Sleep Hygiene -Provides group verbal and written instruction about how sleep can affect your health.  Define sleep hygiene, discuss sleep cycles and impact of sleep habits. Review good sleep hygiene tips.    Initial Review & Psychosocial Screening:  Initial Psych Review & Screening - 01/26/20 1411      Initial Review   Current issues with Current Sleep Concerns;Current Stress Concerns    Source of Stress Concerns Occupation    Comments working on his health to return to work.      Family Dynamics   Good Support System? Yes   wife     Barriers   Psychosocial barriers to participate in program There are no identifiable barriers or psychosocial needs.;The patient should benefit from training in  stress management and relaxation.      Screening Interventions   Interventions Encouraged to exercise;To provide support and resources with identified psychosocial needs;Provide feedback about the scores to participant    Expected Outcomes Short Term goal: Utilizing psychosocial counselor, staff and physician to assist with identification of specific Stressors or current issues interfering with healing process. Setting desired goal for each stressor or current issue identified.;Long Term  Goal: Stressors or current issues are controlled or eliminated.;Short Term goal: Identification and review with participant of any Quality of Life or Depression concerns found by scoring the questionnaire.;Long Term goal: The participant improves quality of Life and PHQ9 Scores as seen by post scores and/or verbalization of changes           Quality of Life Scores:   Quality of Life - 01/30/20 1530      Quality of Life   Select Quality of Life      Quality of Life Scores   Health/Function Pre 28.8 %    Socioeconomic Pre 30 %    Psych/Spiritual Pre 29.14 %    Family Pre 30 %    GLOBAL Pre 29.29 %          Scores of 19 and below usually indicate a poorer quality of life in these areas.  A difference of  2-3 points is a clinically meaningful difference.  A difference of 2-3 points in the total score of the Quality of Life Index has been associated with significant improvement in overall quality of life, self-image, physical symptoms, and general health in studies assessing change in quality of life.  PHQ-9: Recent Review Flowsheet Data    Depression screen [facility] 2/9 01/30/2020   Decreased Interest 0   Down, Depressed, Hopeless 0   PHQ - 2 Score 0   Altered sleeping 0   Tired, decreased energy 0   Change in appetite 0   Feeling bad or failure about yourself  0   Trouble concentrating 0   Moving slowly or fidgety/restless 0   Suicidal thoughts 0   PHQ-9 Score 0   Difficult doing work/chores Not  difficult at all     Interpretation of Total Score  Total Score Depression Severity:  1-4 = Minimal depression, 5-9 = Mild depression, 10-14 = Moderate depression, 15-19 = Moderately severe depression, 20-27 = Severe depression   Psychosocial Evaluation and Intervention:  Psychosocial Evaluation - 01/26/20 1418      Psychosocial Evaluation & Interventions   Comments Advay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 He has a history of renal transplant in 2011 and states all is going well with that. He does have some issues sleeping because some of his medications have to be taken later, but he is getting used to it. He doesn't report any major stressors besides being focused on getting better. He and his wife are looking forward to the education during the program and he is ready to boost his stamina and strength in order to get back to work.    Expected Outcomes Short: attend cardiac rehab for education and exercise. Long: develop positive self care habits.           Psychosocial Re-Evaluation:   Psychosocial Discharge (Final Psychosocial Re-Evaluation):   Vocational Rehabilitation: Provide vocational rehab assistance to qualifying candidates.   Vocational Rehab Evaluation & Intervention:  Vocational Rehab - 01/26/20 1411      Initial Vocational Rehab Evaluation & Intervention   Assessment shows need for Vocational Rehabilitation No           Education: Education Goals: Education classes will be provided on a variety of topics geared toward better understanding of heart health and risk factor modification. Participant will state understanding/return demonstration of topics  presented as noted by education test scores.  Learning Barriers/Preferences:  Learning Barriers/Preferences - 01/26/20 1410      Learning  Barriers/Preferences   Learning Barriers None    Learning Preferences None           General Cardiac Education Topics:  AED/CPR: - Group verbal and written instruction with the use of models to demonstrate the basic use of the AED with the basic ABC's of resuscitation.   Anatomy and Cardiac Procedures: - Group verbal and visual presentation and models provide information about basic cardiac anatomy and function. Reviews the testing methods done to diagnose heart disease and the outcomes of the test results. Describes the treatment choices: Medical Management, Angioplasty, or Coronary Bypass Surgery for treating various heart conditions including Myocardial Infarction, Angina, Valve Disease, and Cardiac Arrhythmias.  Written material given at graduation. Flowsheet Row Cardiac Rehab from 01/30/2020 in [facility] Cardiac and Pulmonary Rehab  Education need identified 01/30/20      Medication Safety: - Group verbal and visual instruction to review commonly prescribed medications for heart and lung disease. Reviews the medication, class of the drug, and side effects. Includes the steps to properly store meds and maintain the prescription regimen.  Written material given at graduation.   Intimacy: - Group verbal instruction through game format to discuss how heart and lung disease can affect sexual intimacy. Written material given at graduation..   Know Your Numbers and Heart Failure: - Group verbal and visual instruction to discuss disease risk factors for cardiac and pulmonary disease and treatment options.  Reviews associated critical values for Overweight/Obesity, Hypertension, Cholesterol, and Diabetes.  Discusses basics of heart failure: signs/symptoms and treatments.  Introduces Heart Failure Zone chart for action plan for heart failure.  Written material given at graduation.   Infection Prevention: - Provides verbal and written material to individual with discussion of infection control  including proper hand washing and proper equipment cleaning during exercise session. Flowsheet Row Cardiac Rehab from 01/30/2020 in [facility] Cardiac and Pulmonary Rehab  Date 01/30/20  Educator [facility]  Instruction Review Code 1- Verbalizes Understanding      Falls Prevention: - Provides verbal and written material to individual with discussion of falls prevention and safety. Flowsheet Row Cardiac Rehab from 01/30/2020 in [facility] Cardiac and Pulmonary Rehab  Date 01/30/20  Educator [facility]  Instruction Review Code 1- Verbalizes Understanding      Other: -Provides group and verbal instruction on various topics (see comments)   Knowledge Questionnaire Score:  Knowledge Questionnaire Score - 01/30/20 1531      Knowledge Questionnaire Score   Pre Score 21/26 Education Focus: MI, angina, nutrtion, exercise           Core Components/Risk Factors/Patient Goals at Admission:  Personal Goals and Risk Factors at Admission - 01/30/20 1532      Core Components/Risk Factors/Patient Goals on Admission    Weight Management Yes;Weight Loss    Intervention Weight Management: Develop a combined nutrition and exercise program designed to reach desired caloric intake, while maintaining appropriate intake of nutrient and fiber, sodium and fats, and appropriate energy expenditure required for the weight goal.;Weight Management: Provide education and appropriate resources to help participant work on and attain dietary goals.    Admit Weight 195 lb 11.2 oz (88.8 kg)    Goal Weight: Short Term 190 lb (86.2 kg)    Goal Weight: Long Term 190 lb (86.2 kg)    Expected Outcomes Short Term: Continue to assess and modify interventions until short term  weight is achieved;Long Term: Adherence to nutrition and physical activity/exercise program aimed toward attainment of established weight goal;Weight Loss: Understanding of general recommendations for a balanced deficit meal plan, which promotes 1-2 lb weight loss per week and  includes a negative energy balance of [phone] kcal/d;Understanding recommendations for meals to include 15-35% energy as protein, 25-35% energy from fat, 35-60% energy from carbohydrates, less than 200mg  of dietary cholesterol, 20-35 gm of total fiber daily;Understanding of distribution of calorie intake throughout the day with the consumption of 4-5 meals/snacks    Diabetes Yes    Intervention Provide education about signs/symptoms and action to take for hypo/hyperglycemia.;Provide education about proper nutrition, including hydration, and aerobic/resistive exercise prescription along with prescribed medications to achieve blood glucose in normal ranges: Fasting glucose 65-99 mg/dL    Expected Outcomes Short Term: Participant verbalizes understanding of the signs/symptoms and immediate care of hyper/hypoglycemia, proper foot care and importance of medication, aerobic/resistive exercise and nutrition plan for blood glucose control.;Long Term: Attainment of HbA1C < 7%.    Heart Failure Yes    Intervention Provide a combined exercise and nutrition program that is supplemented with education, support and counseling about heart failure. Directed toward relieving symptoms such as shortness of breath, decreased exercise tolerance, and extremity edema.    Expected Outcomes Short term: Attendance in program 2-3 days a week with increased exercise capacity. Reported lower sodium intake. Reported increased fruit and vegetable intake. Reports medication compliance.;Improve functional capacity of life;Short term: Daily weights obtained and reported for increase. Utilizing diuretic protocols set by physician.;Long term: Adoption of self-care skills and reduction of barriers for early signs and symptoms recognition and intervention leading to self-care maintenance.    Hypertension Yes    Intervention Provide education on lifestyle modifcations including regular physical activity/exercise, weight management, moderate  sodium restriction and increased consumption of fresh fruit, vegetables, and low fat dairy, alcohol moderation, and smoking cessation.;Monitor prescription use compliance.    Expected Outcomes Short Term: Continued assessment and intervention until BP is < 140/83mm HG in hypertensive participants. < 130/73mm HG in hypertensive participants with diabetes, heart failure or chronic kidney disease.;Long Term: Maintenance of blood pressure at goal levels.    Lipids Yes    Intervention Provide education and support for participant on nutrition & aerobic/resistive exercise along with prescribed medications to achieve LDL 70mg , HDL >40mg .    Expected Outcomes Short Term: Participant states understanding of desired cholesterol values and is compliant with medications prescribed. Participant is following exercise prescription and nutrition guidelines.;Long Term: Cholesterol controlled with medications as prescribed, with individualized exercise RX and with personalized nutrition plan. Value goals: LDL < 70mg , HDL > 40 mg.           Education:Diabetes - Individual verbal and written instruction to review signs/symptoms of diabetes, desired ranges of glucose level fasting, after meals and with exercise. Acknowledge that pre and post exercise glucose checks will be done for 3 sessions at entry of program. [place] from 01/30/2020 in [facility] Cardiac and Pulmonary Rehab  Date 01/26/20  Educator [facility]  Instruction Review Code 1- Verbalizes Understanding      Core Components/Risk Factors/Patient Goals Review:    Core Components/Risk Factors/Patient Goals at Discharge (Final Review):    ITP Comments:  ITP Comments    Row Name 01/26/20 1404 01/30/20 1526         ITP Comments Initial telephone orientation completed. Diagnosis can be found in [facility] 10/19. EP orientation scheduled for Tuesday 12/14 at 10am. Completed 6MWT  and gym orientation. Initial ITP created and sent for review to Dr. Emily Filbert, Medical Director.             Comments: Initial ITP

## 2020-01-30 NOTE — Patient Instructions (Signed)
Patient Instructions  Patient Details  Name: Casey [name]. MRN: [id number] Date of Birth: 1960/09/26 Referring Provider:  Larey Dresser, MD  Below are your personal goals for exercise, nutrition, and risk factors. Our goal is to help you stay on track towards obtaining and maintaining these goals. We will be discussing your progress on these goals with you throughout the program.  Initial Exercise Prescription:  Initial Exercise Prescription - 01/30/20 1500      Date of Initial Exercise RX and Referring Provider   Date 01/30/20    Referring Provider Loralie Champagne MD      Treadmill   MPH 2.3    Grade 1    Minutes 15    METs 3.08      NuStep   Level 4    SPM 80    Minutes 15    METs 3      Elliptical   Level 1    Speed 3.5    Minutes 15    METs 3      Biostep-RELP   Level 4    SPM 50    Minutes 15    METs 3      Prescription Details   Frequency (times per week) 2    Duration Progress to 30 minutes of continuous aerobic without signs/symptoms of physical distress      Intensity   THRR 40-80% of Max Heartrate 109-144    Ratings of Perceived Exertion 11-13    Perceived Dyspnea 0-4      Progression   Progression Continue to progress workloads to maintain intensity without signs/symptoms of physical distress.      Resistance Training   Training Prescription Yes    Weight 4 lb    Reps 10-15           Exercise Goals: Frequency: Be able to perform aerobic exercise two to three times per week in program working toward 2-5 days per week of home exercise.  Intensity: Work with a perceived exertion of 11 (fairly light) - 15 (hard) while following your exercise prescription.  We will make changes to your prescription with you as you progress through the program.   Duration: Be able to do 30 to 45 minutes of continuous aerobic exercise in addition to a 5 minute warm-up and a 5 minute cool-down routine.   Nutrition Goals: Your personal nutrition goals will  be established when you do your nutrition analysis with the dietician.  The following are general nutrition guidelines to follow: Cholesterol < 200mg /day Sodium < 1500mg /day Fiber: Men over 50 yrs - 30 grams per day  Personal Goals:  Personal Goals and Risk Factors at Admission - 01/30/20 1532      Core Components/Risk Factors/Patient Goals on Admission    Weight Management Yes;Weight Loss    Intervention Weight Management: Develop a combined nutrition and exercise program designed to reach desired caloric intake, while maintaining appropriate intake of nutrient and fiber, sodium and fats, and appropriate energy expenditure required for the weight goal.;Weight Management: Provide education and appropriate resources to help participant work on and attain dietary goals.    Admit Weight 195 lb 11.2 oz (88.8 kg)    Goal Weight: Short Term 190 lb (86.2 kg)    Goal Weight: Long Term 190 lb (86.2 kg)    Expected Outcomes Short Term: Continue to assess and modify interventions until short term weight is achieved;Long Term: Adherence to nutrition and physical activity/exercise program aimed toward attainment of established weight  goal;Weight Loss: Understanding of general recommendations for a balanced deficit meal plan, which promotes 1-2 lb weight loss per week and includes a negative energy balance of [phone] kcal/d;Understanding recommendations for meals to include 15-35% energy as protein, 25-35% energy from fat, 35-60% energy from carbohydrates, less than 200mg  of dietary cholesterol, 20-35 gm of total fiber daily;Understanding of distribution of calorie intake throughout the day with the consumption of 4-5 meals/snacks    Diabetes Yes    Intervention Provide education about signs/symptoms and action to take for hypo/hyperglycemia.;Provide education about proper nutrition, including hydration, and aerobic/resistive exercise prescription along with prescribed medications to achieve blood glucose in  normal ranges: Fasting glucose 65-99 mg/dL    Expected Outcomes Short Term: Participant verbalizes understanding of the signs/symptoms and immediate care of hyper/hypoglycemia, proper foot care and importance of medication, aerobic/resistive exercise and nutrition plan for blood glucose control.;Long Term: Attainment of HbA1C < 7%.    Heart Failure Yes    Intervention Provide a combined exercise and nutrition program that is supplemented with education, support and counseling about heart failure. Directed toward relieving symptoms such as shortness of breath, decreased exercise tolerance, and extremity edema.    Expected Outcomes Short term: Attendance in program 2-3 days a week with increased exercise capacity. Reported lower sodium intake. Reported increased fruit and vegetable intake. Reports medication compliance.;Improve functional capacity of life;Short term: Daily weights obtained and reported for increase. Utilizing diuretic protocols set by physician.;Long term: Adoption of self-care skills and reduction of barriers for early signs and symptoms recognition and intervention leading to self-care maintenance.    Hypertension Yes    Intervention Provide education on lifestyle modifcations including regular physical activity/exercise, weight management, moderate sodium restriction and increased consumption of fresh fruit, vegetables, and low fat dairy, alcohol moderation, and smoking cessation.;Monitor prescription use compliance.    Expected Outcomes Short Term: Continued assessment and intervention until BP is < 140/52mm HG in hypertensive participants. < 130/65mm HG in hypertensive participants with diabetes, heart failure or chronic kidney disease.;Long Term: Maintenance of blood pressure at goal levels.    Lipids Yes    Intervention Provide education and support for participant on nutrition & aerobic/resistive exercise along with prescribed medications to achieve LDL 70mg , HDL >40mg .    Expected  Outcomes Short Term: Participant states understanding of desired cholesterol values and is compliant with medications prescribed. Participant is following exercise prescription and nutrition guidelines.;Long Term: Cholesterol controlled with medications as prescribed, with individualized exercise RX and with personalized nutrition plan. Value goals: LDL < 70mg , HDL > 40 mg.           Tobacco Use Initial Evaluation: Social History   Tobacco Use  Smoking Status Never Smoker  Smokeless Tobacco Never Used    Exercise Goals and Review:  Exercise Goals    Row Name 01/30/20 1529             Exercise Goals   Increase Physical Activity Yes       Intervention Provide advice, education, support and counseling about physical activity/exercise needs.;Develop an individualized exercise prescription for aerobic and resistive training based on initial evaluation findings, risk stratification, comorbidities and participant's personal goals.       Expected Outcomes Short Term: Attend rehab on a regular basis to increase amount of physical activity.;Long Term: Add in home exercise to make exercise part of routine and to increase amount of physical activity.;Long Term: Exercising regularly at least 3-5 days a week.       Increase Strength  and Stamina Yes       Intervention Provide advice, education, support and counseling about physical activity/exercise needs.;Develop an individualized exercise prescription for aerobic and resistive training based on initial evaluation findings, risk stratification, comorbidities and participant's personal goals.       Expected Outcomes Short Term: Increase workloads from initial exercise prescription for resistance, speed, and METs.;Short Term: Perform resistance training exercises routinely during rehab and add in resistance training at home;Long Term: Improve cardiorespiratory fitness, muscular endurance and strength as measured by increased METs and functional capacity  (6MWT)       Able to understand and use rate of perceived exertion (RPE) scale Yes       Intervention Provide education and explanation on how to use RPE scale       Expected Outcomes Long Term:  Able to use RPE to guide intensity level when exercising independently;Short Term: Able to use RPE daily in rehab to express subjective intensity level       Able to understand and use Dyspnea scale Yes       Intervention Provide education and explanation on how to use Dyspnea scale       Expected Outcomes Long Term: Able to use Dyspnea scale to guide intensity level when exercising independently;Short Term: Able to use Dyspnea scale daily in rehab to express subjective sense of shortness of breath during exertion       Knowledge and understanding of Target Heart Rate Range (THRR) Yes       Intervention Provide education and explanation of THRR including how the numbers were predicted and where they are located for reference       Expected Outcomes Short Term: Able to state/look up THRR;Short Term: Able to use daily as guideline for intensity in rehab;Long Term: Able to use THRR to govern intensity when exercising independently       Able to check pulse independently Yes       Intervention Provide education and demonstration on how to check pulse in carotid and radial arteries.;Review the importance of being able to check your own pulse for safety during independent exercise       Expected Outcomes Short Term: Able to explain why pulse checking is important during independent exercise;Long Term: Able to check pulse independently and accurately       Understanding of Exercise Prescription Yes       Intervention Provide education, explanation, and written materials on patient's individual exercise prescription       Expected Outcomes Short Term: Able to explain program exercise prescription;Long Term: Able to explain home exercise prescription to exercise independently              Copy of goals given to  participant.

## 2020-02-02 ENCOUNTER — Other Ambulatory Visit: Payer: Self-pay

## 2020-02-02 MED ORDER — WARFARIN SODIUM 1 MG PO TABS
ORAL_TABLET | ORAL | 1 refills | Status: DC
Start: 2020-02-02 — End: 2020-02-05

## 2020-02-03 ENCOUNTER — Other Ambulatory Visit: Payer: Self-pay | Admitting: Cardiology

## 2020-02-05 ENCOUNTER — Other Ambulatory Visit: Payer: Self-pay

## 2020-02-05 ENCOUNTER — Ambulatory Visit (INDEPENDENT_AMBULATORY_CARE_PROVIDER_SITE_OTHER): Payer: BC Managed Care – PPO

## 2020-02-05 DIAGNOSIS — I2102 ST elevation (STEMI) myocardial infarction involving left anterior descending coronary artery: Secondary | ICD-10-CM | POA: Diagnosis not present

## 2020-02-05 DIAGNOSIS — I4891 Unspecified atrial fibrillation: Secondary | ICD-10-CM | POA: Diagnosis not present

## 2020-02-05 DIAGNOSIS — Z7901 Long term (current) use of anticoagulants: Secondary | ICD-10-CM

## 2020-02-05 DIAGNOSIS — I236 Thrombosis of atrium, auricular appendage, and ventricle as current complications following acute myocardial infarction: Secondary | ICD-10-CM

## 2020-02-05 LAB — POCT INR: INR: 4.1 — AB (ref 2.0–3.0)

## 2020-02-05 NOTE — Patient Instructions (Signed)
-   skip warfarin today - have a serving of greens today, then  - START NEW DOSAGE of warfarin 3 tablets every day EXCEPT 4 tablets on MONDAYS & FRIDAYS. - Recheck INR in 1 week.

## 2020-02-06 ENCOUNTER — Other Ambulatory Visit (HOSPITAL_COMMUNITY): Payer: Self-pay | Admitting: Cardiology

## 2020-02-06 ENCOUNTER — Ambulatory Visit (HOSPITAL_COMMUNITY)
Admission: RE | Admit: 2020-02-06 | Discharge: 2020-02-06 | Disposition: A | Discharge status: Home / Self Care | Payer: BC Managed Care – PPO | Source: Ambulatory Visit | Attending: Internal Medicine | Admitting: Internal Medicine

## 2020-02-06 VITALS — BP 120/62 | HR 76 | Wt 198.4 lb

## 2020-02-06 DIAGNOSIS — N183 Chronic kidney disease, stage 3 unspecified: Secondary | ICD-10-CM | POA: Diagnosis not present

## 2020-02-06 DIAGNOSIS — Z79899 Other long term (current) drug therapy: Secondary | ICD-10-CM | POA: Insufficient documentation

## 2020-02-06 DIAGNOSIS — E1122 Type 2 diabetes mellitus with diabetic chronic kidney disease: Secondary | ICD-10-CM | POA: Insufficient documentation

## 2020-02-06 DIAGNOSIS — Z940 Kidney transplant status: Secondary | ICD-10-CM | POA: Diagnosis not present

## 2020-02-06 DIAGNOSIS — I480 Paroxysmal atrial fibrillation: Secondary | ICD-10-CM | POA: Diagnosis not present

## 2020-02-06 DIAGNOSIS — I5022 Chronic systolic (congestive) heart failure: Secondary | ICD-10-CM

## 2020-02-06 DIAGNOSIS — Z7902 Long term (current) use of antithrombotics/antiplatelets: Secondary | ICD-10-CM | POA: Insufficient documentation

## 2020-02-06 DIAGNOSIS — I2109 ST elevation (STEMI) myocardial infarction involving other coronary artery of anterior wall: Secondary | ICD-10-CM | POA: Insufficient documentation

## 2020-02-06 DIAGNOSIS — I2510 Atherosclerotic heart disease of native coronary artery without angina pectoris: Secondary | ICD-10-CM | POA: Diagnosis present

## 2020-02-06 DIAGNOSIS — I513 Intracardiac thrombosis, not elsewhere classified: Secondary | ICD-10-CM | POA: Insufficient documentation

## 2020-02-06 DIAGNOSIS — I255 Ischemic cardiomyopathy: Secondary | ICD-10-CM | POA: Insufficient documentation

## 2020-02-06 DIAGNOSIS — Z7984 Long term (current) use of oral hypoglycemic drugs: Secondary | ICD-10-CM | POA: Diagnosis not present

## 2020-02-06 DIAGNOSIS — Z7901 Long term (current) use of anticoagulants: Secondary | ICD-10-CM | POA: Diagnosis not present

## 2020-02-06 MED ORDER — CARVEDILOL 12.5 MG PO TABS
12.5000 mg | ORAL_TABLET | Freq: Two times a day (BID) | ORAL | 3 refills | Status: DC
Start: 2020-02-06 — End: 2020-04-03

## 2020-02-06 NOTE — Patient Instructions (Addendum)
It was a pleasure seeing you today!  MEDICATIONS: -We are changing your medications today -Increase carvedilol to 12.5 mg (1 tablet) twice daily. You may take 3 tablets of the 3.125 mg strength twice daily until you pick up the new strength.  -Call if you have questions about your medications.   NEXT APPOINTMENT: Return to clinic in 3 weeks with Pharmacy Clinic.  In general, to take care of your heart failure: -Limit your fluid intake to 2 Liters (half-gallon) per day.   -Limit your salt intake to ideally 2-3 grams (2000-3000 mg) per day. -Weigh yourself daily and record, and bring that "weight diary" to your next appointment.  (Weight gain of 2-3 pounds in 1 day typically means fluid weight.) -The medications for your heart are to help your heart and help you live longer.   -Please contact us before stopping any of your heart medications.  Call the clinic at [phone] with questions or to reschedule future appointments.

## 2020-02-06 NOTE — Progress Notes (Signed)
PCP: Dion Body, MD Cardiology: Dr. Aundra Dubin  HPI:  59 y.o. with history of renal transplant in 2011, CAD s/p anterior MI, and ischemic cardiomyopathy presents for followup of CHF and CAD.  Patient had his renal transplant at [facility], and has been followed by [facility] as well as [facility] since that time.  He had been doing well until 10/21.  In early 10/21, he developed chest pain.  He did not go immediately to the ER, he presented when the pain had continued for > 1 day.  He was found to have acute anterior MI with late presentation. He went for cath, LAD was occluded and there was severe diffuse disease in the RCA as well as severe disease in the LCx system.  He had DES to the LAD.  He subsequently developed cardiogenic shock as well as suspected septic shock, possible from gut source.  Abdominal imaging showed profound ileus.  He was taken to the OR for Impella 5.5 placement, but this was deferred due to the finding of LV thrombus on intra-op TEE.  He also developed atrial fibrillation with RVR, controlled by amiodarone and eventually converted back to NSR.  He was maintained on milrinone 0.25 with gradual improvement.  However, he developed AKI, likely due to contrast as well as cardiorenal. Creatinine went up to 5.  Creatinine gradually improved down to 3.38 at discharge, and we were able to discontinue milrinone.  Ileus also gradually resolved.  Echo in the hospital showed EF 20-25%.  He is wearing a Lifevest.   Patient recently returned to [facility] for followup on 01/15/20.  He had a rash from the Lifevest that itched.  He had not been using torsemide.  Weight was up but now wearing winter clothing.  He had been doing well symptomatically.  Reported walking in stores and could walk up to 1/2 mile at a time without dyspnea.  Ok with stairs.  No orthopnea/PND.  BP was elevated in clinic. No palpitations, he was in NSR in clinic.  Still doing home PT. No BRBPR/melena.   Today he  returns to HF clinic for pharmacist medication titration. At last visit with MD carvedilol was increased to 6.25 mg BID, Bidil was increased to 2 tablets TID and Farxiga 10 mg daily was initiated. Overall he is feeling well today. Did not some dizziness immediately after medication changes, but this resolved within 1 week. No chest pain or palpitations. No SOB/DOE. Has been walking 1 mile in cardiac rehab and has been more active at home. Weight has been stable at home, ranging 187-190 lbs. He has not needed any PRN torsemide. No LEE, PND or orthopnea. Taking all medications as prescribed and tolerating all medications.    HF Medications: Carvedilol 6.25 mg BID Farxiga 10 mg daily Bidil 20-37.5 mg 2 tablets TID Torsemide 20 mg PRN  Has the patient been experiencing any side effects to the medications prescribed?  no  Does the patient have any problems obtaining medications due to transportation or finances?   No - has [facility]  Understanding of regimen: good Understanding of indications: good Potential of compliance: good Patient understands to avoid NSAIDs. Patient understands to avoid decongestants.    Pertinent Lab Values: . 01/25/20: Serum creatinine 2.43, BUN 30, Potassium 3.9, Sodium 141  Vital Signs: . Weight: 198.4 lbs (last clinic weight: 195.7 lbs) . Blood pressure: 120/62  . Heart rate: 76   Assessment: 1. CAD: S/p late presentation anterior MI in 10/21 with DES to  LAD.  He has residual severe disease in the RCA which is not revascularizable.   He has 95% mid-distal LCx stenosis and 80% OM3 stenosis that could potentially be intervened upon.  No chest pain.  - Referred for cardiac rehab at [facility] - Continue Plavix for up to 1 year post-PCI.  He is now off ASA as he is also on warfarin.  Can stop Plavix at 6 months if he develops issues with bleeding.   - Continue statin, good lipids in 11/21.  - If creatinine decreases back to baseline around 1, will arrange for PCI  to LCx system.  For now, medical management.  2. Atrial fibrillation: Paroxysmal.   - Continue amiodarone 200 mg daily.  LFTs/TSH normal recently, will need regular eye exam.  - Continue warfarin. - Consider eventual atrial fibrillation ablation. Will have him seen by EP for this but will wait to see if he needs ICD also.  3. Chronic systolic CHF: Ischemic cardiomyopathy.  Echo in 10/21 with EF 20-25%.   - Not volume overloaded on exam, NYHA class II.  - Continue torsemide 20 mg PRN.  - Increase Carvedilol to 12.5 mg BID. - Continue Bidil 2 tabs TID.   - Continue Farxiga 10 mg daily (GFR > 25).  - Will use ARNI and spironolactone if creatinine improves. Will repeat BMET next visit.   - Continue Lifevest for now.   - Needs echo at 3 months for ?ICD (1/22).  4. LV thrombus: Early thrombus in LV in setting of low EF.  - Now on warfarin.  5. 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 Type 2 diabetes: Continue Farxiga and glipizide   Plan: 1) Medication changes: Based on clinical presentation, vital signs and recent labs will increase carvedilol to 12.5 mg BID. 3) Follow-up: 3 weeks with Pharmacy Clinic   Audry Riles, PharmD, BCPS, BCCP, CPP Heart Failure Clinic Pharmacist [phone]

## 2020-02-07 ENCOUNTER — Inpatient Hospital Stay (HOSPITAL_COMMUNITY): Admission: RE | Admit: 2020-02-07 | Payer: BC Managed Care – PPO | Source: Ambulatory Visit

## 2020-02-08 ENCOUNTER — Other Ambulatory Visit: Payer: Self-pay

## 2020-02-08 DIAGNOSIS — I213 ST elevation (STEMI) myocardial infarction of unspecified site: Secondary | ICD-10-CM

## 2020-02-08 DIAGNOSIS — I2102 ST elevation (STEMI) myocardial infarction involving left anterior descending coronary artery: Secondary | ICD-10-CM | POA: Diagnosis not present

## 2020-02-08 DIAGNOSIS — Z955 Presence of coronary angioplasty implant and graft: Secondary | ICD-10-CM

## 2020-02-08 LAB — GLUCOSE, CAPILLARY
Glucose-Capillary: 210 mg/dL — ABNORMAL HIGH (ref 70–99)
Glucose-Capillary: 187 mg/dL — ABNORMAL HIGH (ref 70–99)

## 2020-02-08 NOTE — Progress Notes (Signed)
Daily Session Note  Patient Details  Name: Casey [name]. MRN: [id number] Date of Birth: 1960-07-08 Referring Provider:   Flowsheet Row Cardiac Rehab from 01/30/2020 in [facility] Cardiac and Pulmonary Rehab  Referring Provider Loralie Champagne MD      Encounter Date: 02/08/2020  Check In:  Session Check In - 02/08/20 0930      Check-In   Supervising physician immediately available to respond to emergencies See telemetry face sheet for immediately available ER MD    Location [facility]-Cardiac & Pulmonary Rehab    Staff Present Birdie Sons, MPA, RN;Melissa Caiola RDN, Rowe Pavy, BA, ACSM CEP, Exercise Physiologist    Virtual Visit No    Medication changes reported     No    Fall or balance concerns reported    No    Warm-up and Cool-down Performed on first and last piece of equipment    Resistance Training Performed Yes    VAD Patient? No    PAD/SET Patient? No      Pain Assessment   Currently in Pain? No/denies              Social History   Tobacco Use  Smoking Status Never Smoker  Smokeless Tobacco Never Used    Goals Met:  Independence with exercise equipment Exercise tolerated well No report of cardiac concerns or symptoms Strength training completed today  Goals Unmet:  Not Applicable  Comments: First full day of exercise!  Patient was oriented to gym and equipment including functions, settings, policies, and procedures.  Patient's individual exercise prescription and treatment plan were reviewed.  All starting workloads were established based on the results of the 6 minute walk test done at initial orientation visit.  The plan for exercise progression was also introduced and progression will be customized based on patient's performance and goals.     Dr. Emily Filbert is Medical Director for [place] and [facility].

## 2020-02-12 ENCOUNTER — Ambulatory Visit (INDEPENDENT_AMBULATORY_CARE_PROVIDER_SITE_OTHER): Payer: BC Managed Care – PPO

## 2020-02-12 ENCOUNTER — Other Ambulatory Visit: Payer: Self-pay

## 2020-02-12 DIAGNOSIS — Z7901 Long term (current) use of anticoagulants: Secondary | ICD-10-CM | POA: Diagnosis not present

## 2020-02-12 DIAGNOSIS — I2102 ST elevation (STEMI) myocardial infarction involving left anterior descending coronary artery: Secondary | ICD-10-CM | POA: Diagnosis not present

## 2020-02-12 DIAGNOSIS — I4891 Unspecified atrial fibrillation: Secondary | ICD-10-CM | POA: Diagnosis not present

## 2020-02-12 DIAGNOSIS — I236 Thrombosis of atrium, auricular appendage, and ventricle as current complications following acute myocardial infarction: Secondary | ICD-10-CM

## 2020-02-12 LAB — POCT INR: INR: 2.1 (ref 2.0–3.0)

## 2020-02-12 NOTE — Patient Instructions (Signed)
-   continue dosage of warfarin 3 tablets every day EXCEPT 4 tablets on [place]. - Recheck INR in 1 week.   Coumadin Clinic [facility] [phone].

## 2020-02-13 ENCOUNTER — Encounter: Payer: BC Managed Care – PPO | Admitting: *Deleted

## 2020-02-13 DIAGNOSIS — I2102 ST elevation (STEMI) myocardial infarction involving left anterior descending coronary artery: Secondary | ICD-10-CM | POA: Diagnosis not present

## 2020-02-13 DIAGNOSIS — I213 ST elevation (STEMI) myocardial infarction of unspecified site: Secondary | ICD-10-CM

## 2020-02-13 DIAGNOSIS — Z955 Presence of coronary angioplasty implant and graft: Secondary | ICD-10-CM

## 2020-02-13 LAB — GLUCOSE, CAPILLARY
Glucose-Capillary: 197 mg/dL — ABNORMAL HIGH (ref 70–99)
Glucose-Capillary: 176 mg/dL — ABNORMAL HIGH (ref 70–99)

## 2020-02-13 NOTE — Progress Notes (Signed)
Daily Session Note  Patient Details  Name: Casey [name]. MRN: [id number] Date of Birth: 1960-09-01 Referring Provider:   Flowsheet Row Cardiac Rehab from 01/30/2020 in [facility] Cardiac and Pulmonary Rehab  Referring Provider Loralie Champagne MD      Encounter Date: 02/13/2020  Check In:  Session Check In - 02/13/20 1036      Check-In   Supervising physician immediately available to respond to emergencies See telemetry face sheet for immediately available ER MD    Location [facility]-Cardiac & Pulmonary Rehab    Staff Present Nada Maclachlan, BA, ACSM CEP, Exercise Physiologist;Melissa Caiola RDN, LDN;Tosca Pletz, RN, BSN, CCRP    Virtual Visit No    Medication changes reported     No    Fall or balance concerns reported    No    Warm-up and Cool-down Performed on first and last piece of equipment    Resistance Training Performed Yes    VAD Patient? No    PAD/SET Patient? No      Pain Assessment   Currently in Pain? No/denies              Social History   Tobacco Use  Smoking Status Never Smoker  Smokeless Tobacco Never Used    Goals Met:  Independence with exercise equipment Exercise tolerated well No report of cardiac concerns or symptoms  Goals Unmet:  Not Applicable  Comments: Pt able to follow exercise prescription today without complaint.  Will continue to monitor for progression.    Dr. Emily Filbert is Medical Director for [place] and [facility].

## 2020-02-14 ENCOUNTER — Encounter: Payer: Self-pay | Admitting: *Deleted

## 2020-02-14 DIAGNOSIS — I213 ST elevation (STEMI) myocardial infarction of unspecified site: Secondary | ICD-10-CM

## 2020-02-14 DIAGNOSIS — Z955 Presence of coronary angioplasty implant and graft: Secondary | ICD-10-CM

## 2020-02-14 NOTE — Progress Notes (Signed)
Cardiac Individual Treatment Plan  Patient Details  Name: Casey [name]. MRN: [id number] Date of Birth: 1960-04-24 Referring Provider:   Flowsheet Row Cardiac Rehab from 01/30/2020 in [facility] Cardiac and Pulmonary Rehab  Referring Provider Loralie Champagne MD      Initial Encounter Date:  Flowsheet Row Cardiac Rehab from 01/30/2020 in [facility] Cardiac and Pulmonary Rehab  Date 01/30/20      Visit Diagnosis: Status post coronary artery stent placement  ST elevation myocardial infarction (STEMI), unspecified artery ([place])  Patient's Home Medications on Admission:  Current Outpatient Medications:  .  amiodarone (PACERONE) 200 MG tablet, Take 1 tablet (200 mg total) by mouth daily., Disp: 30 tablet, Rfl: 6 .  carvedilol (COREG) 12.5 MG tablet, Take 1 tablet (12.5 mg total) by mouth 2 (two) times daily., Disp: 180 tablet, Rfl: 3 .  clopidogrel (PLAVIX) 75 MG tablet, Take 1 tablet (75 mg total) by mouth daily., Disp: 30 tablet, Rfl: 6 .  dapagliflozin propanediol (FARXIGA) 10 MG TABS tablet, Take 1 tablet (10 mg total) by mouth daily before breakfast., Disp: 90 tablet, Rfl: 3 .  glipiZIDE (GLUCOTROL) 10 MG tablet, Take 10 mg by mouth 2 (two) times daily before a meal., Disp: , Rfl:  .  isosorbide-hydrALAZINE (BIDIL) 20-37.5 MG tablet, Take 2 tablets by mouth 3 (three) times daily., Disp: 180 tablet, Rfl: 6 .  loratadine (CLARITIN) 10 MG tablet, Take 10 mg by mouth daily as needed for allergies., Disp: , Rfl:  .  nitroGLYCERIN (NITROSTAT) 0.4 MG SL tablet, Place 1 tablet (0.4 mg total) under the tongue every 5 (five) minutes as needed for chest pain., Disp: 100 tablet, Rfl: 3 .  pantoprazole (PROTONIX) 40 MG tablet, Take 1 tablet (40 mg total) by mouth daily., Disp: 30 tablet, Rfl: 6 .  predniSONE (DELTASONE) 5 MG tablet, Take 1 tablet (5 mg total) by mouth daily with breakfast., Disp: , Rfl:  .  rosuvastatin (CRESTOR) 40 MG tablet, Take 1 tablet (40 mg total) by mouth daily., Disp: 30 tablet,  Rfl: 6 .  simethicone (MYLICON) 947 MG chewable tablet, Chew 125 mg by mouth every 6 (six) hours as needed for flatulence., Disp: , Rfl:  .  tacrolimus (PROGRAF) 1 MG capsule, Take 4 tablets  By mouth every morning and 3 tablets by mouth every evening, Disp: , Rfl:  .  torsemide (DEMADEX) 20 MG tablet, Take 1 tablet (20 mg total) by mouth as needed. 3 pound weight gain in 24 hours, Disp: 30 tablet, Rfl: 6 .  warfarin (COUMADIN) 1 MG tablet, TAKE 2 TABLETS DAILY OR AS DIRECTED BY ANTICOAGULATION CLINIC., Disp: 60 tablet, Rfl: 1  Past Medical History: Past Medical History:  Diagnosis Date  . CHF (congestive heart failure) ([place])   . Chronic kidney disease 04/2009   Kidney Transplant  . Diabetes mellitus   . GERD (gastroesophageal reflux disease)    as needed reflux  . Heart attack ([place]) 02/02/2020  . Hypertension   . 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    Tobacco Use: Social History   Tobacco Use  Smoking Status Never Smoker  Smokeless Tobacco Never Used    Labs: Recent Review Flowsheet Data    Labs for ITP Cardiac and Pulmonary Rehab Latest Ref Rng & Units 12/12/2019 12/13/2019 12/14/2019 12/15/2019 12/26/2019   Cholestrol 0 - 200 mg/dL - - - - 147   LDLCALC 0 - 99 mg/dL - - - - 68   HDL >40 mg/dL - - - -  43   Trlycerides <150 mg/dL - - - - 181(H)   Hemoglobin A1c 4.8 - 5.6 % - - - - -   PHART 7.350 - 7.450 - - - - -   PCO2ART 32.0 - 48.0 mmHg - - - - -   HCO3 20.0 - 28.0 mmol/L - - - - -   TCO2 22 - 32 mmol/L - - - - -   ACIDBASEDEF 0.0 - 2.0 mmol/L - - - - -   O2SAT % 65.2 70.7 68.9 72.2 -       Exercise Target Goals: Exercise Program Goal: Individual exercise prescription set using results from initial 6 min walk test and THRR while considering  patient's activity barriers and safety.   Exercise Prescription Goal: Initial exercise prescription builds to 30-45 minutes a day of aerobic activity, 2-3 days per week.  Home exercise  guidelines will be given to patient during program as part of exercise prescription that the participant will acknowledge.   Education: Aerobic Exercise: - Group verbal and visual presentation on the components of exercise prescription. Introduces F.I.T.T principle from ACSM for exercise prescriptions.  Reviews F.I.T.T. principles of aerobic exercise including progression. Written material given at graduation. Flowsheet Row Cardiac Rehab from 02/08/2020 in [facility] Cardiac and Pulmonary Rehab  Education need identified 01/30/20      Education: Resistance Exercise: - Group verbal and visual presentation on the components of exercise prescription. Introduces F.I.T.T principle from ACSM for exercise prescriptions  Reviews F.I.T.T. principles of resistance exercise including progression. Written material given at graduation. Flowsheet Row Cardiac Rehab from 02/08/2020 in [facility] Cardiac and Pulmonary Rehab  Date 02/08/20  Educator [facility]  Instruction Review Code 1- [employer] Understanding       Education: Exercise & Equipment Safety: - Individual verbal instruction and demonstration of equipment use and safety with use of the equipment. Flowsheet Row Cardiac Rehab from 02/08/2020 in [facility] Cardiac and Pulmonary Rehab  Date 01/30/20  Educator [facility]  Instruction Review Code 1- Verbalizes Understanding      Education: Exercise Physiology & General Exercise Guidelines: - Group verbal and written instruction with models to review the exercise physiology of the cardiovascular system and associated critical values. Provides general exercise guidelines with specific guidelines to those with heart or lung disease.    Education: Flexibility, Balance, Mind/Body Relaxation: - Group verbal and visual presentation with interactive activity on the components of exercise prescription. Introduces F.I.T.T principle from ACSM for exercise prescriptions. Reviews F.I.T.T. principles of flexibility and balance exercise  training including progression. Also discusses the mind body connection.  Reviews various relaxation techniques to help reduce and manage stress (i.e. Deep breathing, progressive muscle relaxation, and visualization). Balance handout provided to take home. Written material given at graduation.   Activity Barriers & Risk Stratification:  Activity Barriers & Cardiac Risk Stratification - 01/30/20 1527      Activity Barriers & Cardiac Risk Stratification   Activity Barriers Other (comment);Deconditioning    Comments Life Vest; Renal Transplant, L shoulder tingly    Cardiac Risk Stratification High           6 Minute Walk:  6 Minute Walk    Row Name 01/30/20 1526         6 Minute Walk   Phase Initial     Distance 1230 feet     Walk Time 6 minutes     # of Rest Breaks 0     MPH 2.33     METS 3.71  RPE 9     VO2 Peak 12.97     Symptoms No     Resting HR 75 bpm     Resting BP 128/70     Resting Oxygen Saturation  97 %     Exercise Oxygen Saturation  during 6 min walk 96 %     Max Ex. HR 101 bpm     Max Ex. BP 146/64     2 Minute Post BP 126/64            Oxygen Initial Assessment:   Oxygen Re-Evaluation:   Oxygen Discharge (Final Oxygen Re-Evaluation):   Initial Exercise Prescription:  Initial Exercise Prescription - 01/30/20 1500      Date of Initial Exercise RX and Referring Provider   Date 01/30/20    Referring Provider Loralie Champagne MD      Treadmill   MPH 2.3    Grade 1    Minutes 15    METs 3.08      NuStep   Level 4    SPM 80    Minutes 15    METs 3      Elliptical   Level 1    Speed 3.5    Minutes 15    METs 3      Biostep-RELP   Level 4    SPM 50    Minutes 15    METs 3      Prescription Details   Frequency (times per week) 2    Duration Progress to 30 minutes of continuous aerobic without signs/symptoms of physical distress      Intensity   THRR 40-80% of Max Heartrate 109-144    Ratings of Perceived Exertion 11-13     Perceived Dyspnea 0-4      Progression   Progression Continue to progress workloads to maintain intensity without signs/symptoms of physical distress.      Resistance Training   Training Prescription Yes    Weight 4 lb    Reps 10-15           Perform Capillary Blood Glucose checks as needed.  Exercise Prescription Changes:  Exercise Prescription Changes    Row Name 01/30/20 1500 02/13/20 1200           Response to Exercise   Blood Pressure (Admit) 128/70 142/76      Blood Pressure (Exercise) 146/64 152/64      Blood Pressure (Exit) 126/64 118/60      Heart Rate (Admit) 75 bpm 73 bpm      Heart Rate (Exercise) 101 bpm 92 bpm      Heart Rate (Exit) 76 bpm 76 bpm      Oxygen Saturation (Admit) 97 % --      Oxygen Saturation (Exercise) 96 % --      Rating of Perceived Exertion (Exercise) 9 12      Symptoms none none      Comments walk test results --             Resistance Training   Training Prescription -- Yes      Weight -- 4 lb      Reps -- 10-15             Treadmill   MPH -- 2.3      Grade -- 1      Minutes -- 15      METs -- 3.08             NuStep  Level -- 4      SPM -- 80      Minutes -- 15      METs -- 2.1             Exercise Comments:  Exercise Comments    Row Name 02/08/20 0932           Exercise Comments First full day of exercise!  Patient was oriented to gym and equipment including functions, settings, policies, and procedures.  Patient's individual exercise prescription and treatment plan were reviewed.  All starting workloads were established based on the results of the 6 minute walk test done at initial orientation visit.  The plan for exercise progression was also introduced and progression will be customized based on patient's performance and goals.              Exercise Goals and Review:  Exercise Goals    Row Name 01/30/20 1529             Exercise Goals   Increase Physical Activity Yes       Intervention Provide  advice, education, support and counseling about physical activity/exercise needs.;Develop an individualized exercise prescription for aerobic and resistive training based on initial evaluation findings, risk stratification, comorbidities and participant's personal goals.       Expected Outcomes Short Term: Attend rehab on a regular basis to increase amount of physical activity.;Long Term: Add in home exercise to make exercise part of routine and to increase amount of physical activity.;Long Term: Exercising regularly at least 3-5 days a week.       Increase Strength and Stamina Yes       Intervention Provide advice, education, support and counseling about physical activity/exercise needs.;Develop an individualized exercise prescription for aerobic and resistive training based on initial evaluation findings, risk stratification, comorbidities and participant's personal goals.       Expected Outcomes Short Term: Increase workloads from initial exercise prescription for resistance, speed, and METs.;Short Term: Perform resistance training exercises routinely during rehab and add in resistance training at home;Long Term: Improve cardiorespiratory fitness, muscular endurance and strength as measured by increased METs and functional capacity (6MWT)       Able to understand and use rate of perceived exertion (RPE) scale Yes       Intervention Provide education and explanation on how to use RPE scale       Expected Outcomes Long Term:  Able to use RPE to guide intensity level when exercising independently;Short Term: Able to use RPE daily in rehab to express subjective intensity level       Able to understand and use Dyspnea scale Yes       Intervention Provide education and explanation on how to use Dyspnea scale       Expected Outcomes Long Term: Able to use Dyspnea scale to guide intensity level when exercising independently;Short Term: Able to use Dyspnea scale daily in rehab to express subjective sense of  shortness of breath during exertion       Knowledge and understanding of Target Heart Rate Range (THRR) Yes       Intervention Provide education and explanation of THRR including how the numbers were predicted and where they are located for reference       Expected Outcomes Short Term: Able to state/look up THRR;Short Term: Able to use daily as guideline for intensity in rehab;Long Term: Able to use THRR to govern intensity when exercising independently       Able to  check pulse independently Yes       Intervention Provide education and demonstration on how to check pulse in carotid and radial arteries.;Review the importance of being able to check your own pulse for safety during independent exercise       Expected Outcomes Short Term: Able to explain why pulse checking is important during independent exercise;Long Term: Able to check pulse independently and accurately       Understanding of Exercise Prescription Yes       Intervention Provide education, explanation, and written materials on patient's individual exercise prescription       Expected Outcomes Short Term: Able to explain program exercise prescription;Long Term: Able to explain home exercise prescription to exercise independently              Exercise Goals Re-Evaluation :  Exercise Goals Re-Evaluation    Row Name 02/08/20 0932             Exercise Goal Re-Evaluation   Exercise Goals Review Increase Physical Activity;Able to understand and use rate of perceived exertion (RPE) scale;Knowledge and understanding of Target Heart Rate Range (THRR);Understanding of Exercise Prescription;Increase Strength and Stamina;Able to understand and use Dyspnea scale;Able to check pulse independently       Comments Reviewed RPE and dyspnea scales, THR and program prescription with pt today.  Pt voiced understanding and was given a copy of goals to take home.       Expected Outcomes Short: Use RPE daily to regulate intensity. Long: Follow  program prescription in THR.              Discharge Exercise Prescription (Final Exercise Prescription Changes):  Exercise Prescription Changes - 02/13/20 1200      Response to Exercise   Blood Pressure (Admit) 142/76    Blood Pressure (Exercise) 152/64    Blood Pressure (Exit) 118/60    Heart Rate (Admit) 73 bpm    Heart Rate (Exercise) 92 bpm    Heart Rate (Exit) 76 bpm    Rating of Perceived Exertion (Exercise) 12    Symptoms none      Resistance Training   Training Prescription Yes    Weight 4 lb    Reps 10-15      Treadmill   MPH 2.3    Grade 1    Minutes 15    METs 3.08      NuStep   Level 4    SPM 80    Minutes 15    METs 2.1           Nutrition:  Target Goals: Understanding of nutrition guidelines, daily intake of sodium 1500mg , cholesterol 200mg , calories 30% from fat and 7% or less from saturated fats, daily to have 5 or more servings of fruits and vegetables.  Education: All About Nutrition: -Group instruction provided by verbal, written material, interactive activities, discussions, models, and posters to present general guidelines for heart healthy nutrition including fat, fiber, MyPlate, the role of sodium in heart healthy nutrition, utilization of the nutrition label, and utilization of this knowledge for meal planning. Follow up email sent as well. Written material given at graduation.   Biometrics:  Pre Biometrics - 01/30/20 1530      Pre Biometrics   Height 6\' 1"  (1.854 m)    Weight 195 lb 11.2 oz (88.8 kg)    BMI (Calculated) 25.83    Single Leg Stand 30 seconds            Nutrition Therapy Plan  and Nutrition Goals:   Nutrition Assessments:  MEDIFICTS Score Key:  ?70 Need to make dietary changes   40-70 Heart Healthy Diet  ? 40 Therapeutic Level Cholesterol Diet  Flowsheet Row Cardiac Rehab from 01/30/2020 in [facility] Cardiac and Pulmonary Rehab  Picture Your Plate Total Score on Admission 80     Picture Your Plate  Scores:  <78 Unhealthy dietary pattern with much room for improvement.  41-50 Dietary pattern unlikely to meet recommendations for good health and room for improvement.  51-60 More healthful dietary pattern, with some room for improvement.   >60 Healthy dietary pattern, although there may be some specific behaviors that could be improved.    Nutrition Goals Re-Evaluation:   Nutrition Goals Discharge (Final Nutrition Goals Re-Evaluation):   Psychosocial: Target Goals: Acknowledge presence or absence of significant depression and/or stress, maximize coping skills, provide positive support system. Participant is able to verbalize types and ability to use techniques and skills needed for reducing stress and depression.   Education: Stress, Anxiety, and Depression - Group verbal and visual presentation to define topics covered.  Reviews how body is impacted by stress, anxiety, and depression.  Also discusses healthy ways to reduce stress and to treat/manage anxiety and depression.  Written material given at graduation.   Education: Sleep Hygiene -Provides group verbal and written instruction about how sleep can affect your health.  Define sleep hygiene, discuss sleep cycles and impact of sleep habits. Review good sleep hygiene tips.    Initial Review & Psychosocial Screening:  Initial Psych Review & Screening - 01/26/20 1411      Initial Review   Current issues with Current Sleep Concerns;Current Stress Concerns    Source of Stress Concerns Occupation    Comments working on his health to return to work.      Family Dynamics   Good Support System? Yes   wife     Barriers   Psychosocial barriers to participate in program There are no identifiable barriers or psychosocial needs.;The patient should benefit from training in stress management and relaxation.      Screening Interventions   Interventions Encouraged to exercise;To provide support and resources with identified  psychosocial needs;Provide feedback about the scores to participant    Expected Outcomes Short Term goal: Utilizing psychosocial counselor, staff and physician to assist with identification of specific Stressors or current issues interfering with healing process. Setting desired goal for each stressor or current issue identified.;Long Term Goal: Stressors or current issues are controlled or eliminated.;Short Term goal: Identification and review with participant of any Quality of Life or Depression concerns found by scoring the questionnaire.;Long Term goal: The participant improves quality of Life and PHQ9 Scores as seen by post scores and/or verbalization of changes           Quality of Life Scores:   Quality of Life - 01/30/20 1530      Quality of Life   Select Quality of Life      Quality of Life Scores   Health/Function Pre 28.8 %    Socioeconomic Pre 30 %    Psych/Spiritual Pre 29.14 %    Family Pre 30 %    GLOBAL Pre 29.29 %          Scores of 19 and below usually indicate a poorer quality of life in these areas.  A difference of  2-3 points is a clinically meaningful difference.  A difference of 2-3 points in the total score of the Quality of Life  Index has been associated with significant improvement in overall quality of life, self-image, physical symptoms, and general health in studies assessing change in quality of life.  PHQ-9: Recent Review Flowsheet Data    Depression screen [facility] 2/9 01/30/2020   Decreased Interest 0   Down, Depressed, Hopeless 0   PHQ - 2 Score 0   Altered sleeping 0   Tired, decreased energy 0   Change in appetite 0   Feeling bad or failure about yourself  0   Trouble concentrating 0   Moving slowly or fidgety/restless 0   Suicidal thoughts 0   PHQ-9 Score 0   Difficult doing work/chores Not difficult at all     Interpretation of Total Score  Total Score Depression Severity:  1-4 = Minimal depression, 5-9 = Mild depression, 10-14 = Moderate  depression, 15-19 = Moderately severe depression, 20-27 = Severe depression   Psychosocial Evaluation and Intervention:  Psychosocial Evaluation - 01/26/20 1418      Psychosocial Evaluation & Interventions   Comments Carron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 He has a history of renal transplant in 2011 and states all is going well with that. He does have some issues sleeping because some of his medications have to be taken later, but he is getting used to it. He doesn't report any major stressors besides being focused on getting better. He and his wife are looking forward to the education during the program and he is ready to boost his stamina and strength in order to get back to work.    Expected Outcomes Short: attend cardiac rehab for education and exercise. Long: develop positive self care habits.           Psychosocial Re-Evaluation:   Psychosocial Discharge (Final Psychosocial Re-Evaluation):   Vocational Rehabilitation: Provide vocational rehab assistance to qualifying candidates.   Vocational Rehab Evaluation & Intervention:  Vocational Rehab - 01/26/20 1411      Initial Vocational Rehab Evaluation & Intervention   Assessment shows need for Vocational Rehabilitation No           Education: Education Goals: Education classes will be provided on a variety of topics geared toward better understanding of heart health and risk factor modification. Participant will state understanding/return demonstration of topics presented as noted by education test scores.  Learning Barriers/Preferences:  Learning Barriers/Preferences - 01/26/20 1410      Learning Barriers/Preferences   Learning Barriers None    Learning Preferences None           General Cardiac Education Topics:  AED/CPR: - Group verbal and written  instruction with the use of models to demonstrate the basic use of the AED with the basic ABC's of resuscitation.   Anatomy and Cardiac Procedures: - Group verbal and visual presentation and models provide information about basic cardiac anatomy and function. Reviews the testing methods done to diagnose heart disease and the outcomes of the test results. Describes the treatment choices: Medical Management, Angioplasty, or Coronary Bypass Surgery for treating various heart conditions including Myocardial Infarction, Angina, Valve Disease, and Cardiac Arrhythmias.  Written material given at graduation. Flowsheet Row Cardiac Rehab from 02/08/2020 in [facility] Cardiac and Pulmonary Rehab  Education need identified 01/30/20  Date 02/08/20  Educator SB  Instruction Review Code 1- Verbalizes Understanding  Medication Safety: - Group verbal and visual instruction to review commonly prescribed medications for heart and lung disease. Reviews the medication, class of the drug, and side effects. Includes the steps to properly store meds and maintain the prescription regimen.  Written material given at graduation.   Intimacy: - Group verbal instruction through game format to discuss how heart and lung disease can affect sexual intimacy. Written material given at graduation..   Know Your Numbers and Heart Failure: - Group verbal and visual instruction to discuss disease risk factors for cardiac and pulmonary disease and treatment options.  Reviews associated critical values for Overweight/Obesity, Hypertension, Cholesterol, and Diabetes.  Discusses basics of heart failure: signs/symptoms and treatments.  Introduces Heart Failure Zone chart for action plan for heart failure.  Written material given at graduation.   Infection Prevention: - Provides verbal and written material to individual with discussion of infection control including proper hand washing and proper equipment cleaning during exercise  session. Flowsheet Row Cardiac Rehab from 02/08/2020 in [facility] Cardiac and Pulmonary Rehab  Date 01/30/20  Educator [facility]  Instruction Review Code 1- Verbalizes Understanding      Falls Prevention: - Provides verbal and written material to individual with discussion of falls prevention and safety. Flowsheet Row Cardiac Rehab from 02/08/2020 in [facility] Cardiac and Pulmonary Rehab  Date 01/30/20  Educator [facility]  Instruction Review Code 1- Verbalizes Understanding      Other: -Provides group and verbal instruction on various topics (see comments)   Knowledge Questionnaire Score:  Knowledge Questionnaire Score - 01/30/20 1531      Knowledge Questionnaire Score   Pre Score 21/26 Education Focus: [place], angina, nutrtion, exercise           Core Components/Risk Factors/Patient Goals at Admission:  Personal Goals and Risk Factors at Admission - 01/30/20 1532      Core Components/Risk Factors/Patient Goals on Admission    Weight Management Yes;Weight Loss    Intervention Weight Management: Develop a combined nutrition and exercise program designed to reach desired caloric intake, while maintaining appropriate intake of nutrient and fiber, sodium and fats, and appropriate energy expenditure required for the weight goal.;Weight Management: Provide education and appropriate resources to help participant work on and attain dietary goals.    Admit Weight 195 lb 11.2 oz (88.8 kg)    Goal Weight: Short Term 190 lb (86.2 kg)    Goal Weight: Long Term 190 lb (86.2 kg)    Expected Outcomes Short Term: Continue to assess and modify interventions until short term weight is achieved;Long Term: Adherence to nutrition and physical activity/exercise program aimed toward attainment of established weight goal;Weight Loss: Understanding of general recommendations for a balanced deficit meal plan, which promotes 1-2 lb weight loss per week and includes a negative energy balance of [phone] kcal/d;Understanding  recommendations for meals to include 15-35% energy as protein, 25-35% energy from fat, 35-60% energy from carbohydrates, less than 200mg  of dietary cholesterol, 20-35 gm of total fiber daily;Understanding of distribution of calorie intake throughout the day with the consumption of 4-5 meals/snacks    Diabetes Yes    Intervention Provide education about signs/symptoms and action to take for hypo/hyperglycemia.;Provide education about proper nutrition, including hydration, and aerobic/resistive exercise prescription along with prescribed medications to achieve blood glucose in normal ranges: Fasting glucose 65-99 mg/dL    Expected Outcomes Short Term: Participant verbalizes understanding of the signs/symptoms and immediate care of hyper/hypoglycemia, proper foot care and importance of medication, aerobic/resistive exercise and nutrition plan for blood glucose  control.;Long Term: Attainment of HbA1C < 7%.    Heart Failure Yes    Intervention Provide a combined exercise and nutrition program that is supplemented with education, support and counseling about heart failure. Directed toward relieving symptoms such as shortness of breath, decreased exercise tolerance, and extremity edema.    Expected Outcomes Short term: Attendance in program 2-3 days a week with increased exercise capacity. Reported lower sodium intake. Reported increased fruit and vegetable intake. Reports medication compliance.;Improve functional capacity of life;Short term: Daily weights obtained and reported for increase. Utilizing diuretic protocols set by physician.;Long term: Adoption of self-care skills and reduction of barriers for early signs and symptoms recognition and intervention leading to self-care maintenance.    Hypertension Yes    Intervention Provide education on lifestyle modifcations including regular physical activity/exercise, weight management, moderate sodium restriction and increased consumption of fresh fruit, vegetables,  and low fat dairy, alcohol moderation, and smoking cessation.;Monitor prescription use compliance.    Expected Outcomes Short Term: Continued assessment and intervention until BP is < 140/3mm HG in hypertensive participants. < 130/78mm HG in hypertensive participants with diabetes, heart failure or chronic kidney disease.;Long Term: Maintenance of blood pressure at goal levels.    Lipids Yes    Intervention Provide education and support for participant on nutrition & aerobic/resistive exercise along with prescribed medications to achieve LDL 70mg , HDL >40mg .    Expected Outcomes Short Term: Participant states understanding of desired cholesterol values and is compliant with medications prescribed. Participant is following exercise prescription and nutrition guidelines.;Long Term: Cholesterol controlled with medications as prescribed, with individualized exercise RX and with personalized nutrition plan. Value goals: LDL < 70mg , HDL > 40 mg.           Education:Diabetes - Individual verbal and written instruction to review signs/symptoms of diabetes, desired ranges of glucose level fasting, after meals and with exercise. Acknowledge that pre and post exercise glucose checks will be done for 3 sessions at entry of program. [facility] Sleep from 02/08/2020 in [facility] Cardiac and Pulmonary Rehab  Date 01/26/20  Educator [facility]  Instruction Review Code 1- Verbalizes Understanding      Core Components/Risk Factors/Patient Goals Review:    Core Components/Risk Factors/Patient Goals at Discharge (Final Review):    ITP Comments:  ITP Comments    Row Name 01/26/20 1404 01/30/20 1526 02/08/20 0931 02/14/20 0522     ITP Comments Initial telephone orientation completed. Diagnosis can be found in [facility] 10/19. EP orientation scheduled for Tuesday 12/14 at 10am. Completed 6MWT and gym orientation. Initial ITP created and sent for review to Dr. Emily Filbert, Medical Director. First full day of  exercise!  Patient was oriented to gym and equipment including functions, settings, policies, and procedures.  Patient's individual exercise prescription and treatment plan were reviewed.  All starting workloads were established based on the results of the 6 minute walk test done at initial orientation visit.  The plan for exercise progression was also introduced and progression will be customized based on patient's performance and goals. 30 Day review completed. Medical Director ITP review done, changes made as directed, and signed approval by Medical Director.           Comments:

## 2020-02-15 ENCOUNTER — Encounter: Payer: BC Managed Care – PPO | Admitting: *Deleted

## 2020-02-15 ENCOUNTER — Other Ambulatory Visit: Payer: Self-pay

## 2020-02-15 DIAGNOSIS — Z955 Presence of coronary angioplasty implant and graft: Secondary | ICD-10-CM

## 2020-02-15 DIAGNOSIS — I213 ST elevation (STEMI) myocardial infarction of unspecified site: Secondary | ICD-10-CM

## 2020-02-15 DIAGNOSIS — I2102 ST elevation (STEMI) myocardial infarction involving left anterior descending coronary artery: Secondary | ICD-10-CM | POA: Diagnosis not present

## 2020-02-15 LAB — GLUCOSE, CAPILLARY
Glucose-Capillary: 244 mg/dL — ABNORMAL HIGH (ref 70–99)
Glucose-Capillary: 164 mg/dL — ABNORMAL HIGH (ref 70–99)

## 2020-02-15 NOTE — Progress Notes (Signed)
Daily Session Note  Patient Details  Name: Casey [name]. MRN: [id number] Date of Birth: Jul 12, 1960 Referring Provider:   Flowsheet Row Cardiac Rehab from 01/30/2020 in [facility] Cardiac and Pulmonary Rehab  Referring Provider Loralie Champagne MD      Encounter Date: 02/15/2020  Check In:  Session Check In - 02/15/20 0946      Check-In   Supervising physician immediately available to respond to emergencies See telemetry face sheet for immediately available ER MD    Location [facility]-Cardiac & Pulmonary Rehab    Staff Present Hope Budds RDN, Tawanna Solo, MS Exercise Physiologist;Meredith Mells, RN, BSN, CCRP    Virtual Visit No    Medication changes reported     No    Fall or balance concerns reported    No    Warm-up and Cool-down Performed on first and last piece of equipment    Resistance Training Performed Yes    VAD Patient? No    PAD/SET Patient? No      Pain Assessment   Currently in Pain? No/denies              Social History   Tobacco Use  Smoking Status Never Smoker  Smokeless Tobacco Never Used    Goals Met:  Independence with exercise equipment Exercise tolerated well No report of cardiac concerns or symptoms  Goals Unmet:  Not Applicable  Comments: Pt able to follow exercise prescription today without complaint.  Will continue to monitor for progression.    Dr. Emily Filbert is Medical Director for [facility].

## 2020-02-20 ENCOUNTER — Telehealth: Payer: Self-pay | Admitting: *Deleted

## 2020-02-20 ENCOUNTER — Encounter: Payer: Self-pay | Admitting: *Deleted

## 2020-02-20 DIAGNOSIS — Z955 Presence of coronary angioplasty implant and graft: Secondary | ICD-10-CM

## 2020-02-20 DIAGNOSIS — I213 ST elevation (STEMI) myocardial infarction of unspecified site: Secondary | ICD-10-CM

## 2020-02-20 NOTE — Telephone Encounter (Signed)
Pt's wife called to let us know that his mother had passed on 03/18/20.  She lived in [place] and they will need to go up there to handle affairs.  He will be out at least the next two weeks.

## 2020-02-26 ENCOUNTER — Ambulatory Visit (INDEPENDENT_AMBULATORY_CARE_PROVIDER_SITE_OTHER): Payer: BC Managed Care – PPO | Admitting: Pharmacist

## 2020-02-26 ENCOUNTER — Other Ambulatory Visit: Payer: Self-pay

## 2020-02-26 DIAGNOSIS — I236 Thrombosis of atrium, auricular appendage, and ventricle as current complications following acute myocardial infarction: Secondary | ICD-10-CM

## 2020-02-26 DIAGNOSIS — I4891 Unspecified atrial fibrillation: Secondary | ICD-10-CM | POA: Diagnosis not present

## 2020-02-26 DIAGNOSIS — I2102 ST elevation (STEMI) myocardial infarction involving left anterior descending coronary artery: Secondary | ICD-10-CM

## 2020-02-26 DIAGNOSIS — Z7901 Long term (current) use of anticoagulants: Secondary | ICD-10-CM | POA: Diagnosis not present

## 2020-02-26 LAB — POCT INR: INR: 4.3 — AB (ref 2.0–3.0)

## 2020-02-26 NOTE — Patient Instructions (Signed)
Hold warfarin today and take 1.5 tablets tomorrow then continue dosage of warfarin 3 tablets every day EXCEPT 4 tablets on [place]. - Recheck INR in 1.5 weeks.   Coumadin Clinic [facility] [phone].

## 2020-02-27 ENCOUNTER — Inpatient Hospital Stay (HOSPITAL_COMMUNITY): Admission: RE | Admit: 2020-02-27 | Payer: BC Managed Care – PPO | Source: Ambulatory Visit

## 2020-02-28 NOTE — Progress Notes (Incomplete)
***In Progress*** PCP: Dion Body, MD Cardiology: Dr. Aundra Dubin  HPI:  60 y.o. with history of renal transplant in 2011, CAD s/p anterior MI, and ischemic cardiomyopathy presents for followup of CHF and CAD.  Patient had his renal transplant at [facility], and has been followed by [facility] as well as [facility] since that time.  He had been doing well until 10/21.  In early 10/21, he developed chest pain.  He did not go immediately to the ER, he presented when the pain had continued for > 1 day.  He was found to have acute anterior MI with late presentation. He went for cath, LAD was occluded and there was severe diffuse disease in the RCA as well as severe disease in the LCx system.  He had DES to the LAD.  He subsequently developed cardiogenic shock as well as suspected septic shock, possible from gut source.  Abdominal imaging showed profound ileus.  He was taken to the OR for Impella 5.5 placement, but this was deferred due to the finding of LV thrombus on intra-op TEE.  He also developed atrial fibrillation with RVR, controlled by amiodarone and eventually converted back to NSR.  He was maintained on milrinone 0.25 with gradual improvement.  However, he developed AKI, likely due to contrast as well as cardiorenal. Creatinine went up to 5.  Creatinine gradually improved down to 3.38 at discharge, and we were able to discontinue milrinone.  Ileus also gradually resolved.  Echo in the hospital showed EF 20-25%.  He is wearing a Lifevest.   Patient recently returned to [facility] for followup on 01/15/20.  He had a rash from the Lifevest that itched.  He had not been using torsemide.  Weight was up but now wearing winter clothing.  He had been doing well symptomatically.  Reported walking in stores and could walk up to 1/2 mile at a time without dyspnea.  Ok with stairs.  No orthopnea/PND.  BP was elevated in clinic. No palpitations, he was in NSR in clinic.  Still doing home PT. No BRBPR/melena.    Patient recently returned to [facility] for pharmacist medication titration on 02/06/20. At last visit with MD carvedilol was increased to 6.25 mg BID, Bidil was increased to 2 tablets TID and Farxiga 10 mg daily was initiated. Overall he was feeling well. Did note some dizziness immediately after medication changes, but this resolved within 1 week. No chest pain or palpitations. No SOB/DOE. Had been walking 1 mile in cardiac rehab and had been more active at home. Weight had been stable at home, ranging 187-190 lbs. He had not needed any PRN torsemide. No LEE, PND or orthopnea.  Today he returns to HF clinic for pharmacist medication titration. At last visit with pharmacy clinic, carvedilol was increased to 12.5 mg BID.   HF Medications: Carvedilol 12.5 mg BID Farxiga 10 mg daily Bidil 20-37.5 mg 2 tablets TID Torsemide 20 mg PRN  Has the patient been experiencing any side effects to the medications prescribed?  no  Does the patient have any problems obtaining medications due to transportation or finances?   No - has [facility]  Understanding of regimen: good Understanding of indications: good Potential of compliance: good Patient understands to avoid NSAIDs. Patient understands to avoid decongestants.    Pertinent Lab Values: . 01/25/20: Serum creatinine 2.43, BUN 30, Potassium 3.9, Sodium 141  Vital Signs: . Weight: 198.4 lbs (last clinic weight: 195.7 lbs) . Blood pressure: 120/62  . Heart  rate: 76   Assessment: 1. CAD: S/p late presentation anterior MI in 10/21 with DES to LAD.  He has residual severe disease in the RCA which is not revascularizable.   He has 95% mid-distal LCx stenosis and 80% OM3 stenosis that could potentially be intervened upon.  No chest pain.  - Referred for cardiac [facility] - Continue Plavix for up to 1 year post-PCI.  He is now off ASA as he is also on warfarin.  Can stop Plavix at 6 months if he develops issues with bleeding.   -  Continue statin, good lipids in 11/21.  - If creatinine decreases back to baseline around 1, will arrange for PCI to LCx system.  For now, medical management.  2. Atrial fibrillation: Paroxysmal.   - Continue amiodarone 200 mg daily.  LFTs/TSH normal recently, will need regular eye exam.  - Continue warfarin. - Consider eventual atrial fibrillation ablation. Will have him seen by EP for this but will wait to see if he needs ICD also.  3. Chronic systolic CHF: Ischemic cardiomyopathy.  Echo in 10/21 with EF 20-25%.   - Not volume overloaded on exam, NYHA class II.  - Continue torsemide 20 mg PRN.  - Continue Carvedilol 12.5 mg BID. - Continue Bidil 2 tabs TID.   - Continue Farxiga 10 mg daily (GFR > 25).  - Will use ARNI and spironolactone if creatinine improves. Will repeat BMET next visit.   - Continue Lifevest for now.   - Needs echo at 3 months for ?ICD (1/22).  4. LV thrombus: Early thrombus in LV in setting of low EF.  - Now on warfarin.  5. 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 Type 2 diabetes: Continue Farxiga and glipizide   Plan: 1) Medication changes: Based on clinical presentation, vital signs and recent labs will *** 3) Follow-up: ***   Audry Riles, PharmD, BCPS, BCCP, CPP Heart Failure Clinic Pharmacist [phone]

## 2020-03-06 ENCOUNTER — Other Ambulatory Visit: Payer: Self-pay

## 2020-03-06 ENCOUNTER — Ambulatory Visit (INDEPENDENT_AMBULATORY_CARE_PROVIDER_SITE_OTHER): Payer: BC Managed Care – PPO

## 2020-03-06 DIAGNOSIS — I2102 ST elevation (STEMI) myocardial infarction involving left anterior descending coronary artery: Secondary | ICD-10-CM | POA: Diagnosis not present

## 2020-03-06 DIAGNOSIS — I236 Thrombosis of atrium, auricular appendage, and ventricle as current complications following acute myocardial infarction: Secondary | ICD-10-CM | POA: Diagnosis not present

## 2020-03-06 DIAGNOSIS — I4891 Unspecified atrial fibrillation: Secondary | ICD-10-CM | POA: Diagnosis not present

## 2020-03-06 DIAGNOSIS — Z7901 Long term (current) use of anticoagulants: Secondary | ICD-10-CM

## 2020-03-06 LAB — POCT INR: INR: 3.4 — AB (ref 2.0–3.0)

## 2020-03-06 NOTE — Patient Instructions (Signed)
-   Hold warfarin today and then - START NEW dosage of warfarin 3 tablets every day - Recheck INR in 2 weeks  Coumadin Clinic [facility] [phone].

## 2020-03-07 ENCOUNTER — Encounter: Payer: BC Managed Care – PPO | Attending: Cardiology

## 2020-03-07 DIAGNOSIS — Z955 Presence of coronary angioplasty implant and graft: Secondary | ICD-10-CM | POA: Insufficient documentation

## 2020-03-07 DIAGNOSIS — I213 ST elevation (STEMI) myocardial infarction of unspecified site: Secondary | ICD-10-CM

## 2020-03-07 NOTE — Progress Notes (Signed)
Daily Session Note  Patient Details  Name: Casey [name]. MRN: [id number] Date of Birth: Dec 22, 1960 Referring Provider:   Flowsheet Row Cardiac Rehab from 01/30/2020 in [facility] Cardiac and Pulmonary Rehab  Referring Provider Loralie Champagne MD      Encounter Date: 03/07/2020  Check In:  Session Check In - 03/07/20 0936      Check-In   Supervising physician immediately available to respond to emergencies See telemetry face sheet for immediately available ER MD    Location [facility]-Cardiac & Pulmonary Rehab    Staff Present Birdie Sons, MPA, RN;Melissa Caiola RDN, Rowe Pavy, BA, ACSM CEP, Exercise Physiologist    Virtual Visit No    Medication changes reported     No    Fall or balance concerns reported    No    Warm-up and Cool-down Performed on first and last piece of equipment    Resistance Training Performed Yes    VAD Patient? No    PAD/SET Patient? No      Pain Assessment   Currently in Pain? No/denies              Social History   Tobacco Use  Smoking Status Never Smoker  Smokeless Tobacco Never Used    Goals Met:  Independence with exercise equipment Exercise tolerated well Personal goals reviewed No report of cardiac concerns or symptoms Strength training completed today  Goals Unmet:  Not Applicable  Comments: Pt able to follow exercise prescription today without complaint.  Will continue to monitor for progression.  Reviewed home exercise with pt today.  Pt plans to walking and weights at home for exercise.  We also talked about using the staff videos for bad weather days. Reviewed THR, pulse, RPE, sign and symptoms, pulse oximetery and when to call 911 or MD.  Also discussed weather considerations and indoor options.  Pt voiced understanding.   Dr. Emily Filbert is Medical Director for [facility].

## 2020-03-08 ENCOUNTER — Encounter (HOSPITAL_COMMUNITY): Payer: Self-pay | Admitting: Cardiology

## 2020-03-08 ENCOUNTER — Ambulatory Visit (HOSPITAL_BASED_OUTPATIENT_CLINIC_OR_DEPARTMENT_OTHER)
Admission: RE | Admit: 2020-03-08 | Discharge: 2020-03-08 | Disposition: A | Discharge status: Home / Self Care | Payer: BC Managed Care – PPO | Source: Ambulatory Visit | Attending: Cardiology | Admitting: Cardiology

## 2020-03-08 ENCOUNTER — Telehealth (HOSPITAL_COMMUNITY): Payer: Self-pay

## 2020-03-08 ENCOUNTER — Other Ambulatory Visit: Payer: Self-pay | Admitting: Cardiology

## 2020-03-08 ENCOUNTER — Ambulatory Visit (HOSPITAL_COMMUNITY)
Admission: RE | Admit: 2020-03-08 | Discharge: 2020-03-08 | DRG: 303 | Disposition: A | Discharge status: Home / Self Care | Payer: BC Managed Care – PPO | Attending: Cardiology | Admitting: Cardiology

## 2020-03-08 ENCOUNTER — Other Ambulatory Visit: Payer: Self-pay

## 2020-03-08 VITALS — BP 140/88 | HR 64 | Wt 193.6 lb

## 2020-03-08 DIAGNOSIS — E1122 Type 2 diabetes mellitus with diabetic chronic kidney disease: Secondary | ICD-10-CM | POA: Diagnosis not present

## 2020-03-08 DIAGNOSIS — I480 Paroxysmal atrial fibrillation: Secondary | ICD-10-CM | POA: Diagnosis present

## 2020-03-08 DIAGNOSIS — Z7902 Long term (current) use of antithrombotics/antiplatelets: Secondary | ICD-10-CM

## 2020-03-08 DIAGNOSIS — I130 Hypertensive heart and chronic kidney disease with heart failure and stage 1 through stage 4 chronic kidney disease, or unspecified chronic kidney disease: Secondary | ICD-10-CM | POA: Diagnosis present

## 2020-03-08 DIAGNOSIS — Z79899 Other long term (current) drug therapy: Secondary | ICD-10-CM | POA: Diagnosis not present

## 2020-03-08 DIAGNOSIS — Z833 Family history of diabetes mellitus: Secondary | ICD-10-CM

## 2020-03-08 DIAGNOSIS — I4891 Unspecified atrial fibrillation: Secondary | ICD-10-CM | POA: Diagnosis not present

## 2020-03-08 DIAGNOSIS — Z940 Kidney transplant status: Secondary | ICD-10-CM | POA: Diagnosis not present

## 2020-03-08 DIAGNOSIS — I5022 Chronic systolic (congestive) heart failure: Secondary | ICD-10-CM

## 2020-03-08 DIAGNOSIS — Z7984 Long term (current) use of oral hypoglycemic drugs: Secondary | ICD-10-CM

## 2020-03-08 DIAGNOSIS — K219 Gastro-esophageal reflux disease without esophagitis: Secondary | ICD-10-CM | POA: Diagnosis present

## 2020-03-08 DIAGNOSIS — Z7901 Long term (current) use of anticoagulants: Secondary | ICD-10-CM

## 2020-03-08 DIAGNOSIS — N183 Chronic kidney disease, stage 3 unspecified: Secondary | ICD-10-CM | POA: Diagnosis present

## 2020-03-08 DIAGNOSIS — Z955 Presence of coronary angioplasty implant and graft: Secondary | ICD-10-CM | POA: Diagnosis not present

## 2020-03-08 DIAGNOSIS — I2510 Atherosclerotic heart disease of native coronary artery without angina pectoris: Secondary | ICD-10-CM | POA: Diagnosis present

## 2020-03-08 DIAGNOSIS — Z7952 Long term (current) use of systemic steroids: Secondary | ICD-10-CM

## 2020-03-08 DIAGNOSIS — I255 Ischemic cardiomyopathy: Secondary | ICD-10-CM | POA: Diagnosis present

## 2020-03-08 DIAGNOSIS — I252 Old myocardial infarction: Secondary | ICD-10-CM | POA: Diagnosis not present

## 2020-03-08 LAB — TSH: TSH: 0.955 u[IU]/mL (ref 0.350–4.500)

## 2020-03-08 LAB — COMPREHENSIVE METABOLIC PANEL
ALT: 24 U/L (ref 0–44)
AST: 23 U/L (ref 15–41)
Albumin: 3.9 g/dL (ref 3.5–5.0)
Alkaline Phosphatase: 42 U/L (ref 38–126)
Anion gap: 10 (ref 5–15)
BUN: 39 mg/dL — ABNORMAL HIGH (ref 6–20)
CO2: 21 mmol/L — ABNORMAL LOW (ref 22–32)
Calcium: 10 mg/dL (ref 8.9–10.3)
Chloride: 107 mmol/L (ref 98–111)
Creatinine, Ser: 2.79 mg/dL — ABNORMAL HIGH (ref 0.61–1.24)
GFR, Estimated: 25 mL/min — ABNORMAL LOW (ref >60)
Glucose, Bld: 219 mg/dL — ABNORMAL HIGH (ref 70–99)
Potassium: 3.8 mmol/L (ref 3.5–5.1)
Sodium: 138 mmol/L (ref 135–145)
Total Bilirubin: 0.7 mg/dL (ref 0.3–1.2)
Total Protein: 6.7 g/dL (ref 6.5–8.1)

## 2020-03-08 LAB — CBC
HCT: 39.6 % (ref 39.0–52.0)
Hemoglobin: 12.7 g/dL — ABNORMAL LOW (ref 13.0–17.0)
MCH: 26.8 pg (ref 26.0–34.0)
MCHC: 32.1 g/dL (ref 30.0–36.0)
MCV: 83.5 fL (ref 80.0–100.0)
Platelets: 238 10*3/uL (ref 150–400)
RBC: 4.74 MIL/uL (ref 4.22–5.81)
RDW: 15.4 % (ref 11.5–15.5)
WBC: 8.1 10*3/uL (ref 4.0–10.5)
nRBC: 0 % (ref 0.0–0.2)

## 2020-03-08 LAB — ECHOCARDIOGRAM COMPLETE
Area-P 1/2: 3.48 cm2
Calc EF: 37.5 %
S' Lateral: 4.3 cm
Single Plane A2C EF: 35.9 %
Single Plane A4C EF: 38.3 %

## 2020-03-08 MED ORDER — SPIRONOLACTONE 25 MG PO TABS: 12.5000 mg | ORAL_TABLET | Freq: Every day | ORAL | 3 refills | Status: DC

## 2020-03-08 NOTE — Telephone Encounter (Signed)
Patient advised and verbalized understanding. Med list updated to reflect changes.  ? ?

## 2020-03-08 NOTE — Patient Instructions (Addendum)
START Spironolactone 12.'5mg'$  (1/2 tablet) daily  Labs done today, your results will be available in MyChart, we will contact you for abnormal readings.  You have been referred to EP. Their office will contact you to schedule an appointment  Your physician recommends that you schedule repeat labs in 10 days  Your physician recommends that you schedule a follow-up appointment in: 3 weeks with Pharmacy  Your physician recommends that you schedule a follow-up appointment in: 2 months  If you have any questions or concerns before your next appointment please send [place] a message through [place] or call our office at [phone].    TO LEAVE A MESSAGE FOR THE NURSE SELECT OPTION 2, PLEASE LEAVE A MESSAGE INCLUDING:  YOUR NAME  DATE OF BIRTH  CALL BACK NUMBER  REASON FOR CALL**this is important as we prioritize the call backs  YOU WILL RECEIVE A CALL BACK THE SAME DAY AS LONG AS YOU CALL BEFORE 4:00 PM

## 2020-03-08 NOTE — Telephone Encounter (Signed)
-----   Message from Larey Dresser, MD sent at 03/08/2020  2:21 PM EST ----- Creatinine is higher than prior.  Would call him and tell him NOT to start spironolactone.

## 2020-03-08 NOTE — Progress Notes (Signed)
  Echocardiogram 2D Echocardiogram has been performed.  Geoffery Lyons Swaim 03/08/2020, 10:35 AM

## 2020-03-10 NOTE — Progress Notes (Signed)
PCP: Dion Body, MD Cardiology: Dr. Aundra Dubin  60 y.o. with history of renal transplant in 2011, CAD s/p anterior MI, and ischemic cardiomyopathy presents for followup of CHF and CAD.  Patient had his renal transplant at [facility], and has been followed by [facility] as well as [facility] since that time.  He had been doing well until 10/21.  In early 10/21, he developed chest pain.  He did not go immediately to the ER, he presented when the pain had continued for > 1 day.  He was found to have acute anterior MI with late presentation. He went for cath, LAD was occluded and there was severe diffuse disease in the RCA as well as severe disease in the LCx system.  He had DES to the LAD.  He subsequently developed cardiogenic shock as well as suspected septic shock, possible from gut source.  Abdominal imaging showed profound ileus.  He was taken to the OR for Impella 5.5 placement, but this was deferred due to the finding of LV thrombus on intra-op TEE.  He also developed atrial fibrillation with RVR, controlled by amiodarone and eventually converted back to NSR.  He was maintained on milrinone 0.25 with gradual improvement.  However, he developed AKI, likely due to contrast as well as cardiorenal. Creatinine went up to 5.  Creatinine gradually improved down to 3.38 at discharge, and we were able to discontinue milrinone.  Ileus also gradually resolved.  Echo in the hospital showed EF 20-25%.  He is wearing a Lifevest.   Echo was done today and reviewed, EF 30-35%, periapical akinesis, no LV thrombus, normal RV.   Patient returns for followup.  No dyspnea walking up 1 flight of stairs or walking on flat ground.  No chest pain.  No BRBPR/melena.  Weight is down 2 lbs.  Doing cardiac rehab at [facility].   Labs (10/21): K 4.3, creatinine 3.38 Labs (11/21): K 4.7, creatinine 2.24, LDL 68, HDL 43, hgb 11.9, LFTs normal Labs (12/21): K 3.9, creatinine 2.43  PMH: 1. Type 2 diabetes 2. Atrial fibrillation:  Paroxysmal, on amiodarone.  3. Renal transplant 2011 [facility].  4. GERD 5. 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 CAD: Late presentation anterior MI in 10/21.  Cath with occluded LAD, long up to 99% mid-distal RCA stenosis, 95% mid-distal LCx, 80% OM3.  He had DES to LAD.   8. Chronic systolic CHF: Ischemic cardiomyopathy. Cardiogenic shock in 10/21 with MI.  - Echo (10/21): EF 20-25%, heavy smoke/early thrombus LV apex, RV normal.  - RHC (10/21, milrinone 0.25): mean RA 4, PA 22/4, mean PCWP 6, CI 2.06 Fick, CI 2.06 thermo.  - Echo (1/22): EF 30-35%, periapical akinesis, no LV thrombus, normal RV.  9. LV thrombus  SH: Married, nonsmoker, no ETOH.  Lives in [place]. 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   Family History  Problem Relation Age of Onset  . Diabetes Mother   . Hyperlipidemia Mother   . Cancer Father    ROS: all systems reviewed and negative except as per HPI.   Current Outpatient Medications  Medication Sig Dispense Refill  . amiodarone (PACERONE) 200 MG tablet Take 1 tablet (200 mg total) by mouth daily. 30 tablet 6  . carvedilol (COREG) 12.5 MG tablet Take 1 tablet (12.5 mg total) by mouth 2 (two) times daily. 180 tablet 3  . clopidogrel (PLAVIX) 75 MG tablet Take 1 tablet (75 mg total) by mouth daily. 30 tablet 6  . dapagliflozin propanediol (FARXIGA) 10 MG  TABS tablet Take 1 tablet (10 mg total) by mouth daily before breakfast. 90 tablet 3  . glipiZIDE (GLUCOTROL) 10 MG tablet Take 10 mg by mouth 2 (two) times daily before a meal.    . isosorbide-hydrALAZINE (BIDIL) 20-37.5 MG tablet Take 2 tablets by mouth 3 (three) times daily. 180 tablet 6  . loratadine (CLARITIN) 10 MG tablet Take 10 mg by mouth daily as needed for allergies.    . nitroGLYCERIN (NITROSTAT) 0.4 MG SL tablet Place 1 tablet (0.4 mg total) under the tongue every 5 (five) minutes as needed for chest pain. 100 tablet 3  . pantoprazole (PROTONIX) 40 MG tablet Take 1 tablet (40 mg total) by mouth daily. 30 tablet  6  . predniSONE (DELTASONE) 5 MG tablet Take 1 tablet (5 mg total) by mouth daily with breakfast.    . rosuvastatin (CRESTOR) 40 MG tablet Take 1 tablet (40 mg total) by mouth daily. 30 tablet 6  . simethicone (MYLICON) 0000000 MG chewable tablet Chew 125 mg by mouth every 6 (six) hours as needed for flatulence.    . tacrolimus (PROGRAF) 1 MG capsule Take 4 mg by mouth 2 (two) times daily.    Marland Kitchen torsemide (DEMADEX) 20 MG tablet Take 1 tablet (20 mg total) by mouth as needed. 3 pound weight gain in 24 hours 30 tablet 6  . warfarin (COUMADIN) 1 MG tablet TAKE 3 TO 4 TABLETS DAILY AS DIRECTED BY ANTICOAGULATION CLINIC. 110 tablet 1   No current facility-administered medications for this encounter.   BP 140/88   Pulse 64   Wt 87.8 kg (193 lb 9.6 oz)   SpO2 96%   BMI 25.54 kg/m  General: NAD Neck: No JVD, no thyromegaly or thyroid nodule.  Lungs: Clear to auscultation bilaterally with normal respiratory effort. CV: Nondisplaced PMI.  Heart regular S1/S2, no S3/S4, no murmur.  No peripheral edema.  No carotid bruit.  Normal pedal pulses.  Abdomen: Soft, nontender, no hepatosplenomegaly, no distention.  Skin: Intact without lesions or rashes.  Neurologic: Alert and oriented x 3.  Psych: Normal affect. Extremities: No clubbing or cyanosis.  HEENT: Normal.   Assessment/Plan: 1. CAD: S/p late presentation anterior MI in 10/21 with DES to LAD.  He has residual severe disease in the RCA which is not revascularizable.   He has 95% mid-distal LCx stenosis and 80% OM3 stenosis that could potentially be intervened upon.  No chest pain.  - Continue cardiac rehab.   - Continue Plavix for up to 1 year post-PCI.  He is now off ASA as he is also on warfarin.  Can stop Plavix at 6 months if he develops issues with bleeding.   - Continue statin, good lipids in 11/21.  - If creatinine decreases back to baseline around 1, will arrange for PCI to LCx system.  For now, medical management.  2. Atrial fibrillation:  Paroxysmal.  He is in NSR today.  - Continue amiodarone 200 mg daily.  Check LFTs and TSH, will need regular eye exam.  - Continue warfarin. CBC today.  - Consider eventual atrial fibrillation ablation so he can safely stop amiodarone.  Will refer to EP.  3. Chronic systolic CHF: Ischemic cardiomyopathy.  Echo in 10/21 with EF 20-25%.  Echo was done today, EF 30-35%.  Not volume overloaded on exam, NYHA class II.   - He can continue to use torsemide prn.  - Continue Bidil 2 tabs tid.  - Continue Coreg 12.5 mg bid.  - Check BMET today,  if creatinine stable will start spironolactone 12.5 mg daily with BMET in 10 days.  - Contiue Farxiga 10 mg daily.   - Refer to EP for ICD with EF persistently < 35%.  Not CRT candidate with narrow QRS.  4. LV thrombus: No thrombus on today's echo.   - Now on warfarin.  5. 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  6. Type 2 diabetes: On Farxiga.   Followup with HF pharmacist in 3 wks for medication titration then see me in 2 months.Loralie Champagne 03/10/2020

## 2020-03-13 ENCOUNTER — Encounter: Payer: Self-pay | Admitting: *Deleted

## 2020-03-13 ENCOUNTER — Inpatient Hospital Stay (HOSPITAL_COMMUNITY): Admission: RE | Admit: 2020-03-13 | Payer: BC Managed Care – PPO | Source: Ambulatory Visit

## 2020-03-13 DIAGNOSIS — Z955 Presence of coronary angioplasty implant and graft: Secondary | ICD-10-CM

## 2020-03-13 DIAGNOSIS — I213 ST elevation (STEMI) myocardial infarction of unspecified site: Secondary | ICD-10-CM

## 2020-03-13 NOTE — Progress Notes (Signed)
Cardiac Individual Treatment Plan  Patient Details  Name: Casey [name]. MRN: [id number] Date of Birth: Jul 07, 1960 Referring Provider:   Flowsheet Row Cardiac Rehab from 01/30/2020 in [facility] Cardiac and Pulmonary Rehab  Referring Provider Loralie Champagne MD      Initial Encounter Date:  Flowsheet Row Cardiac Rehab from 01/30/2020 in [facility] Cardiac and Pulmonary Rehab  Date 01/30/20      Visit Diagnosis: ST elevation myocardial infarction (STEMI), unspecified artery [facility])  Status post coronary artery stent placement  Patient's Home Medications on Admission:  Current Outpatient Medications:  .  amiodarone (PACERONE) 200 MG tablet, Take 1 tablet (200 mg total) by mouth daily., Disp: 30 tablet, Rfl: 6 .  carvedilol (COREG) 12.5 MG tablet, Take 1 tablet (12.5 mg total) by mouth 2 (two) times daily., Disp: 180 tablet, Rfl: 3 .  clopidogrel (PLAVIX) 75 MG tablet, Take 1 tablet (75 mg total) by mouth daily., Disp: 30 tablet, Rfl: 6 .  dapagliflozin propanediol (FARXIGA) 10 MG TABS tablet, Take 1 tablet (10 mg total) by mouth daily before breakfast., Disp: 90 tablet, Rfl: 3 .  glipiZIDE (GLUCOTROL) 10 MG tablet, Take 10 mg by mouth 2 (two) times daily before a meal., Disp: , Rfl:  .  isosorbide-hydrALAZINE (BIDIL) 20-37.5 MG tablet, Take 2 tablets by mouth 3 (three) times daily., Disp: 180 tablet, Rfl: 6 .  loratadine (CLARITIN) 10 MG tablet, Take 10 mg by mouth daily as needed for allergies., Disp: , Rfl:  .  nitroGLYCERIN (NITROSTAT) 0.4 MG SL tablet, Place 1 tablet (0.4 mg total) under the tongue every 5 (five) minutes as needed for chest pain., Disp: 100 tablet, Rfl: 3 .  pantoprazole (PROTONIX) 40 MG tablet, Take 1 tablet (40 mg total) by mouth daily., Disp: 30 tablet, Rfl: 6 .  predniSONE (DELTASONE) 5 MG tablet, Take 1 tablet (5 mg total) by mouth daily with breakfast., Disp: , Rfl:  .  rosuvastatin (CRESTOR) 40 MG tablet, Take 1 tablet (40 mg total) by mouth daily., Disp: 30 tablet,  Rfl: 6 .  simethicone (MYLICON) 0000000 MG chewable tablet, Chew 125 mg by mouth every 6 (six) hours as needed for flatulence., Disp: , Rfl:  .  tacrolimus (PROGRAF) 1 MG capsule, Take 4 mg by mouth 2 (two) times daily., Disp: , Rfl:  .  torsemide (DEMADEX) 20 MG tablet, Take 1 tablet (20 mg total) by mouth as needed. 3 pound weight gain in 24 hours, Disp: 30 tablet, Rfl: 6 .  warfarin (COUMADIN) 1 MG tablet, TAKE 3 TO 4 TABLETS DAILY AS DIRECTED BY ANTICOAGULATION CLINIC., Disp: 110 tablet, Rfl: 1  Past Medical History: Past Medical History:  Diagnosis Date  . CHF (congestive heart failure) ([place])   . Chronic kidney disease 04/2009   Kidney Transplant  . Diabetes mellitus   . GERD (gastroesophageal reflux disease)    as needed reflux  . Heart attack ([place]) 02/02/2020  . Hypertension   . 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    Tobacco Use: Social History   Tobacco Use  Smoking Status Never Smoker  Smokeless Tobacco Never Used    Labs: Recent Review Flowsheet Data    Labs for ITP Cardiac and Pulmonary Rehab Latest Ref Rng & Units 12/12/2019 12/13/2019 12/14/2019 12/15/2019 12/26/2019   Cholestrol 0 - 200 mg/dL - - - - 147   LDLCALC 0 - 99 mg/dL - - - - 68   HDL >40 mg/dL - - - - 43  Trlycerides <150 mg/dL - - - - 181(H)   Hemoglobin A1c 4.8 - 5.6 % - - - - -   PHART 7.350 - 7.450 - - - - -   PCO2ART 32.0 - 48.0 mmHg - - - - -   HCO3 20.0 - 28.0 mmol/L - - - - -   TCO2 22 - 32 mmol/L - - - - -   ACIDBASEDEF 0.0 - 2.0 mmol/L - - - - -   O2SAT % 65.2 70.7 68.9 72.2 -       Exercise Target Goals: Exercise Program Goal: Individual exercise prescription set using results from initial 6 min walk test and THRR while considering  patient's activity barriers and safety.   Exercise Prescription Goal: Initial exercise prescription builds to 30-45 minutes a day of aerobic activity, 2-3 days per week.  Home exercise guidelines will be given to patient  during program as part of exercise prescription that the participant will acknowledge.   Education: Aerobic Exercise: - Group verbal and visual presentation on the components of exercise prescription. Introduces F.I.T.T principle from ACSM for exercise prescriptions.  Reviews F.I.T.T. principles of aerobic exercise including progression. Written material given at graduation. Flowsheet Row Cardiac Rehab from 02/08/2020 in [facility] Cardiac and Pulmonary Rehab  Education need identified 01/30/20      Education: Resistance Exercise: - Group verbal and visual presentation on the components of exercise prescription. Introduces F.I.T.T principle from ACSM for exercise prescriptions  Reviews F.I.T.T. principles of resistance exercise including progression. Written material given at graduation. Flowsheet Row Cardiac Rehab from 02/08/2020 in [facility] Cardiac and Pulmonary Rehab  Date 02/08/20  Educator [facility]  Instruction Review Code 1- [employer] Understanding       Education: Exercise & Equipment Safety: - Individual verbal instruction and demonstration of equipment use and safety with use of the equipment. Flowsheet Row Cardiac Rehab from 02/08/2020 in [facility] Cardiac and Pulmonary Rehab  Date 01/30/20  Educator [facility]  Instruction Review Code 1- Verbalizes Understanding      Education: Exercise Physiology & General Exercise Guidelines: - Group verbal and written instruction with models to review the exercise physiology of the cardiovascular system and associated critical values. Provides general exercise guidelines with specific guidelines to those with heart or lung disease.    Education: Flexibility, Balance, Mind/Body Relaxation: - Group verbal and visual presentation with interactive activity on the components of exercise prescription. Introduces F.I.T.T principle from ACSM for exercise prescriptions. Reviews F.I.T.T. principles of flexibility and balance exercise training including progression. Also  discusses the mind body connection.  Reviews various relaxation techniques to help reduce and manage stress (i.e. Deep breathing, progressive muscle relaxation, and visualization). Balance handout provided to take home. Written material given at graduation.   Activity Barriers & Risk Stratification:  Activity Barriers & Cardiac Risk Stratification - 01/30/20 1527      Activity Barriers & Cardiac Risk Stratification   Activity Barriers Other (comment);Deconditioning    Comments Life Vest; Renal Transplant, L shoulder tingly    Cardiac Risk Stratification High           6 Minute Walk:  6 Minute Walk    Row Name 01/30/20 1526         6 Minute Walk   Phase Initial     Distance 1230 feet     Walk Time 6 minutes     # of Rest Breaks 0     MPH 2.33     METS 3.71     RPE 9  VO2 Peak 12.97     Symptoms No     Resting HR 75 bpm     Resting BP 128/70     Resting Oxygen Saturation  97 %     Exercise Oxygen Saturation  during 6 min walk 96 %     Max Ex. HR 101 bpm     Max Ex. BP 146/64     2 Minute Post BP 126/64            Oxygen Initial Assessment:   Oxygen Re-Evaluation:   Oxygen Discharge (Final Oxygen Re-Evaluation):   Initial Exercise Prescription:  Initial Exercise Prescription - 01/30/20 1500      Date of Initial Exercise RX and Referring Provider   Date 01/30/20    Referring Provider Loralie Champagne MD      Treadmill   MPH 2.3    Grade 1    Minutes 15    METs 3.08      NuStep   Level 4    SPM 80    Minutes 15    METs 3      Elliptical   Level 1    Speed 3.5    Minutes 15    METs 3      Biostep-RELP   Level 4    SPM 50    Minutes 15    METs 3      Prescription Details   Frequency (times per week) 2    Duration Progress to 30 minutes of continuous aerobic without signs/symptoms of physical distress      Intensity   THRR 40-80% of Max Heartrate 109-144    Ratings of Perceived Exertion 11-13    Perceived Dyspnea 0-4       Progression   Progression Continue to progress workloads to maintain intensity without signs/symptoms of physical distress.      Resistance Training   Training Prescription Yes    Weight 4 lb    Reps 10-15           Perform Capillary Blood Glucose checks as needed.  Exercise Prescription Changes:  Exercise Prescription Changes    Row Name 01/30/20 1500 02/13/20 1200 03/07/20 0900         Response to Exercise   Blood Pressure (Admit) 128/70 142/76 --     Blood Pressure (Exercise) 146/64 152/64 --     Blood Pressure (Exit) 126/64 118/60 --     Heart Rate (Admit) 75 bpm 73 bpm --     Heart Rate (Exercise) 101 bpm 92 bpm --     Heart Rate (Exit) 76 bpm 76 bpm --     Oxygen Saturation (Admit) 97 % -- --     Oxygen Saturation (Exercise) 96 % -- --     Rating of Perceived Exertion (Exercise) 9 12 --     Symptoms none none --     Comments walk test results -- --           Resistance Training   Training Prescription -- Yes --     Weight -- 4 lb --     Reps -- 10-15 --           Treadmill   MPH -- 2.3 --     Grade -- 1 --     Minutes -- 15 --     METs -- 3.08 --           NuStep   Level -- 4 --  SPM -- 80 --     Minutes -- 15 --     METs -- 2.1 --           Home Exercise Plan   Plans to continue exercise at -- -- Home (comment)  walking, weights, staff videos     Frequency -- -- Add 2 additional days to program exercise sessions.     Initial Home Exercises Provided -- -- 03/07/20            Exercise Comments:  Exercise Comments    Row Name 02/08/20 0932           Exercise Comments First full day of exercise!  Patient was oriented to gym and equipment including functions, settings, policies, and procedures.  Patient's individual exercise prescription and treatment plan were reviewed.  All starting workloads were established based on the results of the 6 minute walk test done at initial orientation visit.  The plan for exercise progression was also  introduced and progression will be customized based on patient's performance and goals.              Exercise Goals and Review:  Exercise Goals    Row Name 01/30/20 1529             Exercise Goals   Increase Physical Activity Yes       Intervention Provide advice, education, support and counseling about physical activity/exercise needs.;Develop an individualized exercise prescription for aerobic and resistive training based on initial evaluation findings, risk stratification, comorbidities and participant's personal goals.       Expected Outcomes Short Term: Attend rehab on a regular basis to increase amount of physical activity.;Long Term: Add in home exercise to make exercise part of routine and to increase amount of physical activity.;Long Term: Exercising regularly at least 3-5 days a week.       Increase Strength and Stamina Yes       Intervention Provide advice, education, support and counseling about physical activity/exercise needs.;Develop an individualized exercise prescription for aerobic and resistive training based on initial evaluation findings, risk stratification, comorbidities and participant's personal goals.       Expected Outcomes Short Term: Increase workloads from initial exercise prescription for resistance, speed, and METs.;Short Term: Perform resistance training exercises routinely during rehab and add in resistance training at home;Long Term: Improve cardiorespiratory fitness, muscular endurance and strength as measured by increased METs and functional capacity (6MWT)       Able to understand and use rate of perceived exertion (RPE) scale Yes       Intervention Provide education and explanation on how to use RPE scale       Expected Outcomes Long Term:  Able to use RPE to guide intensity level when exercising independently;Short Term: Able to use RPE daily in rehab to express subjective intensity level       Able to understand and use Dyspnea scale Yes        Intervention Provide education and explanation on how to use Dyspnea scale       Expected Outcomes Long Term: Able to use Dyspnea scale to guide intensity level when exercising independently;Short Term: Able to use Dyspnea scale daily in rehab to express subjective sense of shortness of breath during exertion       Knowledge and understanding of Target Heart Rate Range (THRR) Yes       Intervention Provide education and explanation of THRR including how the numbers were predicted and where they are located  for reference       Expected Outcomes Short Term: Able to state/look up THRR;Short Term: Able to use daily as guideline for intensity in rehab;Long Term: Able to use THRR to govern intensity when exercising independently       Able to check pulse independently Yes       Intervention Provide education and demonstration on how to check pulse in carotid and radial arteries.;Review the importance of being able to check your own pulse for safety during independent exercise       Expected Outcomes Short Term: Able to explain why pulse checking is important during independent exercise;Long Term: Able to check pulse independently and accurately       Understanding of Exercise Prescription Yes       Intervention Provide education, explanation, and written materials on patient's individual exercise prescription       Expected Outcomes Short Term: Able to explain program exercise prescription;Long Term: Able to explain home exercise prescription to exercise independently              Exercise Goals Re-Evaluation :  Exercise Goals Re-Evaluation    Row Name 02/08/20 0932 03/07/20 0931           Exercise Goal Re-Evaluation   Exercise Goals Review Increase Physical Activity;Able to understand and use rate of perceived exertion (RPE) scale;Knowledge and understanding of Target Heart Rate Range (THRR);Understanding of Exercise Prescription;Increase Strength and Stamina;Able to understand and use Dyspnea  scale;Able to check pulse independently Increase Physical Activity;Increase Strength and Stamina;Understanding of Exercise Prescription      Comments Reviewed RPE and dyspnea scales, THR and program prescription with pt today.  Pt voiced understanding and was given a copy of goals to take home. Casey [name] returned today after being in [place] and finallizing things after his mother's passing.  He was able to exercise while he was up there.  He was moving furniture and walking 4-5 blocks each day.  He does feel like his strength and stamina are starting to reocover some.  Reviewed home exercise with pt today.  Pt plans to walking and weights at home for exercise.  We also talked about using the staff videos for bad weather days. Reviewed THR, pulse, RPE, sign and symptoms, pulse oximetery and when to call 911 or MD.  Also discussed weather considerations and indoor options.  Pt voiced understanding.      Expected Outcomes Short: Use RPE daily to regulate intensity. Long: Follow program prescription in THR. Short: Start to add in exercise at home Long: Continue to exercise independently             Discharge Exercise Prescription (Final Exercise Prescription Changes):  Exercise Prescription Changes - 03/07/20 0900      Home Exercise Plan   Plans to continue exercise at Home (comment)   walking, weights, staff videos   Frequency Add 2 additional days to program exercise sessions.    Initial Home Exercises Provided 03/07/20           Nutrition:  Target Goals: Understanding of nutrition guidelines, daily intake of sodium '1500mg'$ , cholesterol '200mg'$ , calories 30% from fat and 7% or less from saturated fats, daily to have 5 or more servings of fruits and vegetables.  Education: All About Nutrition: -Group instruction provided by verbal, written material, interactive activities, discussions, models, and posters to present general guidelines for heart healthy nutrition including fat, fiber, MyPlate, the  role of sodium in heart healthy nutrition, utilization of the nutrition label,  and utilization of this knowledge for meal planning. Follow up email sent as well. Written material given at graduation.   Biometrics:  Pre Biometrics - 01/30/20 1530      Pre Biometrics   Height '6\' 1"'$  (1.854 m)    Weight 195 lb 11.2 oz (88.8 kg)    BMI (Calculated) 25.83    Single Leg Stand 30 seconds            Nutrition Therapy Plan and Nutrition Goals:   Nutrition Assessments:  MEDIFICTS Score Key:  ?70 Need to make dietary changes   40-70 Heart Healthy Diet  ? 40 Therapeutic Level Cholesterol Diet  Flowsheet Row Cardiac Rehab from 01/30/2020 in [facility] Cardiac and Pulmonary Rehab  Picture Your Plate Total Score on Admission 80     Picture Your Plate Scores:  [id number] Unhealthy dietary pattern with much room for improvement.  41-50 Dietary pattern unlikely to meet recommendations for good health and room for improvement.  51-60 More healthful dietary pattern, with some room for improvement.   >60 Healthy dietary pattern, although there may be some specific behaviors that could be improved.    Nutrition Goals Re-Evaluation:  Nutrition Goals Re-Evaluation    [place] Name 03/07/20 [phone]             Goals   Nutrition Goal Meet with dietician       Comment Casey [name] missed dietician appointment with his mom's death.  He has now rescheduled with her.       Expected Outcome Meet with dietician              Nutrition Goals Discharge (Final Nutrition Goals Re-Evaluation):  Nutrition Goals Re-Evaluation - 03/07/20 0958      Goals   Nutrition Goal Meet with dietician    Comment Casey [name] missed dietician appointment with his mom's death.  He has now rescheduled with her.    Expected Outcome Meet with dietician           Psychosocial: Target Goals: Acknowledge presence or absence of significant depression and/or stress, maximize coping skills, provide positive support system. Participant is  able to verbalize types and ability to use techniques and skills needed for reducing stress and depression.   Education: Stress, Anxiety, and Depression - Group verbal and visual presentation to define topics covered.  Reviews how body is impacted by stress, anxiety, and depression.  Also discusses healthy ways to reduce stress and to treat/manage anxiety and depression.  Written material given at graduation.   Education: Sleep Hygiene -Provides group verbal and written instruction about how sleep can affect your health.  Define sleep hygiene, discuss sleep cycles and impact of sleep habits. Review good sleep hygiene tips.    Initial Review & Psychosocial Screening:  Initial Psych Review & Screening - 01/26/20 1411      Initial Review   Current issues with Current Sleep Concerns;Current Stress Concerns    Source of Stress Concerns Occupation    Comments working on his health to return to work.      Family Dynamics   Good Support System? Yes   wife     Barriers   Psychosocial barriers to participate in program There are no identifiable barriers or psychosocial needs.;The patient should benefit from training in stress management and relaxation.      Screening Interventions   Interventions Encouraged to exercise;To provide support and resources with identified psychosocial needs;Provide feedback about the scores to participant    Expected Outcomes Short Term goal: Utilizing  psychosocial counselor, staff and physician to assist with identification of specific Stressors or current issues interfering with healing process. Setting desired goal for each stressor or current issue identified.;Long Term Goal: Stressors or current issues are controlled or eliminated.;Short Term goal: Identification and review with participant of any Quality of Life or Depression concerns found by scoring the questionnaire.;Long Term goal: The participant improves quality of Life and PHQ9 Scores as seen by post scores  and/or verbalization of changes           Quality of Life Scores:   Quality of Life - 01/30/20 1530      Quality of Life   Select Quality of Life      Quality of Life Scores   Health/Function Pre 28.8 %    Socioeconomic Pre 30 %    Psych/Spiritual Pre 29.14 %    Family Pre 30 %    GLOBAL Pre 29.29 %          Scores of 19 and below usually indicate a poorer quality of life in these areas.  A difference of  2-3 points is a clinically meaningful difference.  A difference of 2-3 points in the total score of the Quality of Life Index has been associated with significant improvement in overall quality of life, self-image, physical symptoms, and general health in studies assessing change in quality of life.  PHQ-9: Recent Review Flowsheet Data    Depression screen [facility] 2/9 01/30/2020   Decreased Interest 0   Down, Depressed, Hopeless 0   PHQ - 2 Score 0   Altered sleeping 0   Tired, decreased energy 0   Change in appetite 0   Feeling bad or failure about yourself  0   Trouble concentrating 0   Moving slowly or fidgety/restless 0   Suicidal thoughts 0   PHQ-9 Score 0   Difficult doing work/chores Not difficult at all     Interpretation of Total Score  Total Score Depression Severity:  1-4 = Minimal depression, 5-9 = Mild depression, 10-14 = Moderate depression, 15-19 = Moderately severe depression, 20-27 = Severe depression   Psychosocial Evaluation and Intervention:  Psychosocial Evaluation - 01/26/20 1418      Psychosocial Evaluation & Interventions   Comments Casey [name]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 He has a history of renal transplant in 2011 and states all is going well with that. He does have some issues sleeping because some of his medications have to be taken later, but he is getting used to it. He doesn't report  any major stressors besides being focused on getting better. He and his wife are looking forward to the education during the program and he is ready to boost his stamina and strength in order to get back to work.    Expected Outcomes Short: attend cardiac rehab for education and exercise. Long: develop positive self care habits.           Psychosocial Re-Evaluation:  Psychosocial Re-Evaluation    Casey [name] Name 03/07/20 0932             Psychosocial Re-Evaluation   Current issues with Current Stress Concerns       Comments Casey [name] recently/suddenly lost his mom.  They have been trying to pack things up, she lived in [place] but wanted to  be buried in her home town on [place], [place] so he had some scheduling on logistics for that.  His brother who lives near by here was able to help as well. They are headed back up there again next week to finish up some of the packing.  His managing and coping the best he can.  He is sleeping well. Otherwise, he is doing pretty good.       Expected Outcomes Short: Finish up packing up mom's things Long: Continue to cope with death positively       Interventions Encouraged to attend Cardiac Rehabilitation for the exercise;Stress management education       Continue Psychosocial Services  Follow up required by staff              Psychosocial Discharge (Final Psychosocial Re-Evaluation):  Psychosocial Re-Evaluation - 03/07/20 0932      Psychosocial Re-Evaluation   Current issues with Current Stress Concerns    Comments Casey [name] recently/suddenly lost his mom.  They have been trying to pack things up, she lived in [place] but wanted to be buried in her home town on [place], [place] so he had some scheduling on logistics for that.  His brother who lives near by here was able to help as well. They are headed back up there again next week to finish up some of the packing.  His managing and coping the best he can.  He is sleeping well. Otherwise, he is doing pretty  good.    Expected Outcomes Short: Finish up packing up mom's things Long: Continue to cope with death positively    Interventions Encouraged to attend Cardiac Rehabilitation for the exercise;Stress management education    Continue Psychosocial Services  Follow up required by staff           Vocational Rehabilitation: Provide vocational rehab assistance to qualifying candidates.   Vocational Rehab Evaluation & Intervention:  Vocational Rehab - 01/26/20 1411      Initial Vocational Rehab Evaluation & Intervention   Assessment shows need for Vocational Rehabilitation No           Education: Education Goals: Education classes will be provided on a variety of topics geared toward better understanding of heart health and risk factor modification. Participant will state understanding/return demonstration of topics presented as noted by education test scores.  Learning Barriers/Preferences:  Learning Barriers/Preferences - 01/26/20 1410      Learning Barriers/Preferences   Learning Barriers None    Learning Preferences None           General Cardiac Education Topics:  AED/CPR: - Group verbal and written instruction with the use of models to demonstrate the basic use of the AED with the basic ABC's of resuscitation.   Anatomy and Cardiac Procedures: - Group verbal and visual presentation and models provide information about basic cardiac anatomy and function. Reviews the testing methods done to diagnose heart disease and the outcomes of the test results. Describes the treatment choices: Medical Management, Angioplasty, or Coronary Bypass Surgery for treating various heart conditions including Myocardial Infarction, Angina, Valve Disease, and Cardiac Arrhythmias.  Written material given at graduation. Flowsheet Row Cardiac Rehab from 02/08/2020 in [facility] Cardiac and Pulmonary Rehab  Education need identified 01/30/20  Date 02/08/20  Educator SB  Instruction Review Code 1-  Verbalizes Understanding      Medication Safety: - Group verbal and visual instruction to review commonly prescribed medications for heart and lung disease. Reviews the medication, class of the  drug, and side effects. Includes the steps to properly store meds and maintain the prescription regimen.  Written material given at graduation.   Intimacy: - Group verbal instruction through game format to discuss how heart and lung disease can affect sexual intimacy. Written material given at graduation..   Know Your Numbers and Heart Failure: - Group verbal and visual instruction to discuss disease risk factors for cardiac and pulmonary disease and treatment options.  Reviews associated critical values for Overweight/Obesity, Hypertension, Cholesterol, and Diabetes.  Discusses basics of heart failure: signs/symptoms and treatments.  Introduces Heart Failure Zone chart for action plan for heart failure.  Written material given at graduation.   Infection Prevention: - Provides verbal and written material to individual with discussion of infection control including proper hand washing and proper equipment cleaning during exercise session. Flowsheet Row Cardiac Rehab from 02/08/2020 in [facility] Cardiac and Pulmonary Rehab  Date 01/30/20  Educator [facility]  Instruction Review Code 1- Verbalizes Understanding      Falls Prevention: - Provides verbal and written material to individual with discussion of falls prevention and safety. Flowsheet Row Cardiac Rehab from 02/08/2020 in [facility] Cardiac and Pulmonary Rehab  Date 01/30/20  Educator [facility]  Instruction Review Code 1- Verbalizes Understanding      Other: -Provides group and verbal instruction on various topics (see comments)   Knowledge Questionnaire Score:  Knowledge Questionnaire Score - 01/30/20 1531      Knowledge Questionnaire Score   Pre Score 21/26 Education Focus: MI, angina, nutrtion, exercise           Core Components/Risk  Factors/Patient Goals at Admission:  Personal Goals and Risk Factors at Admission - 01/30/20 1532      Core Components/Risk Factors/Patient Goals on Admission    Weight Management Yes;Weight Loss    Intervention Weight Management: Develop a combined nutrition and exercise program designed to reach desired caloric intake, while maintaining appropriate intake of nutrient and fiber, sodium and fats, and appropriate energy expenditure required for the weight goal.;Weight Management: Provide education and appropriate resources to help participant work on and attain dietary goals.    Admit Weight 195 lb 11.2 oz (88.8 kg)    Goal Weight: Short Term 190 lb (86.2 kg)    Goal Weight: Long Term 190 lb (86.2 kg)    Expected Outcomes Short Term: Continue to assess and modify interventions until short term weight is achieved;Long Term: Adherence to nutrition and physical activity/exercise program aimed toward attainment of established weight goal;Weight Loss: Understanding of general recommendations for a balanced deficit meal plan, which promotes 1-2 lb weight loss per week and includes a negative energy balance of [phone] kcal/d;Understanding recommendations for meals to include 15-35% energy as protein, 25-35% energy from fat, 35-60% energy from carbohydrates, less than '200mg'$  of dietary cholesterol, 20-35 gm of total fiber daily;Understanding of distribution of calorie intake throughout the day with the consumption of 4-5 meals/snacks    Diabetes Yes    Intervention Provide education about signs/symptoms and action to take for hypo/hyperglycemia.;Provide education about proper nutrition, including hydration, and aerobic/resistive exercise prescription along with prescribed medications to achieve blood glucose in normal ranges: Fasting glucose 65-99 mg/dL    Expected Outcomes Short Term: Participant verbalizes understanding of the signs/symptoms and immediate care of hyper/hypoglycemia, proper foot care and  importance of medication, aerobic/resistive exercise and nutrition plan for blood glucose control.;Long Term: Attainment of HbA1C < 7%.    Heart Failure Yes    Intervention Provide a combined exercise and nutrition program  that is supplemented with education, support and counseling about heart failure. Directed toward relieving symptoms such as shortness of breath, decreased exercise tolerance, and extremity edema.    Expected Outcomes Short term: Attendance in program 2-3 days a week with increased exercise capacity. Reported lower sodium intake. Reported increased fruit and vegetable intake. Reports medication compliance.;Improve functional capacity of life;Short term: Daily weights obtained and reported for increase. Utilizing diuretic protocols set by physician.;Long term: Adoption of self-care skills and reduction of barriers for early signs and symptoms recognition and intervention leading to self-care maintenance.    Hypertension Yes    Intervention Provide education on lifestyle modifcations including regular physical activity/exercise, weight management, moderate sodium restriction and increased consumption of fresh fruit, vegetables, and low fat dairy, alcohol moderation, and smoking cessation.;Monitor prescription use compliance.    Expected Outcomes Short Term: Continued assessment and intervention until BP is < 140/36m HG in hypertensive participants. < 130/822mHG in hypertensive participants with diabetes, heart failure or chronic kidney disease.;Long Term: Maintenance of blood pressure at goal levels.    Lipids Yes    Intervention Provide education and support for participant on nutrition & aerobic/resistive exercise along with prescribed medications to achieve LDL '70mg'$ , HDL >'40mg'$ .    Expected Outcomes Short Term: Participant states understanding of desired cholesterol values and is compliant with medications prescribed. Participant is following exercise prescription and nutrition  guidelines.;Long Term: Cholesterol controlled with medications as prescribed, with individualized exercise RX and with personalized nutrition plan. Value goals: LDL < '70mg'$ , HDL > 40 mg.           Education:Diabetes - Individual verbal and written instruction to review signs/symptoms of diabetes, desired ranges of glucose level fasting, after meals and with exercise. Acknowledge that pre and post exercise glucose checks will be done for 3 sessions at entry of program. [facility]  Date 01/26/20  [facility] Code 1- Verbalizes Understanding      Core Components/Risk Factors/Patient Goals Review:   Goals and Risk Factor Review    Row Name 03/07/20 [id number]           Core Components/Risk Factors/Patient Goals Review   Personal Goals Review Weight Management/Obesity;Diabetes;Hypertension;Lipids;Heart Failure       Review Casey [name] doing well with rehab.  He is down to 185 lb at home.  He weighs routinely.  His pressures have been good and he checks them at home.  His sugars have been doing well.  His fasting levels have been around 135 in morning.  He is hoping it will contnue to trend down for him.  He is doing well with his medications overall.  He has a cardiology follow up tomorrow and an echo.  He is hoping to get his LifeVest off!!       Expected Outcomes Short: Go to cardiology follow up and continue to work on diabetes management Long; Continue to monitor risk factors.              Core Components/Risk Factors/Patient Goals at Discharge (Final Review):   Goals and Risk Factor Review - 03/07/20 0937      Core Components/Risk Factors/Patient Goals Review   Personal Goals Review Weight Management/Obesity;Diabetes;Hypertension;Lipids;Heart Failure    Review Casey [name] doing well with rehab.  He is down to 185 lb at home.  He weighs routinely.  His pressures have been good and he checks them at  home.  His sugars  have been doing well.  His fasting levels have been around 135 in morning.  He is hoping it will contnue to trend down for him.  He is doing well with his medications overall.  He has a cardiology follow up tomorrow and an echo.  He is hoping to get his LifeVest off!!    Expected Outcomes Short: Go to cardiology follow up and continue to work on diabetes management Long; Continue to monitor risk factors.           ITP Comments:  ITP Comments    Row Name 01/26/20 1404 01/30/20 1526 02/08/20 0931 02/14/20 0522 02/20/20 0740   ITP Comments Initial telephone orientation completed. Diagnosis can be found in [facility] 10/19. EP orientation scheduled for Tuesday 12/14 at 10am. Completed 6MWT and gym orientation. Initial ITP created and sent for review to Dr. Emily Filbert, Medical Director. First full day of exercise!  Patient was oriented to gym and equipment including functions, settings, policies, and procedures.  Patient's individual exercise prescription and treatment plan were reviewed.  All starting workloads were established based on the results of the 6 minute walk test done at initial orientation visit.  The plan for exercise progression was also introduced and progression will be customized based on patient's performance and goals. 30 Day review completed. Medical Director ITP review done, changes made as directed, and signed approval by Medical Director. Pt's wife called to let us know that his mother had passed on 02-27-20.  She lived in [place] and they will need to go up there to handle affairs.  He will be out at least the next two weeks.   Morrison Name 02/29/20 1130 03/13/20 0846         ITP Comments Sevin has not attended sicne last review due to a death in his family. 30 Day review completed. Medical Director ITP review done, changes made as directed, and signed approval by Medical Director.             Comments:

## 2020-03-18 ENCOUNTER — Ambulatory Visit (INDEPENDENT_AMBULATORY_CARE_PROVIDER_SITE_OTHER): Payer: BC Managed Care – PPO

## 2020-03-18 ENCOUNTER — Other Ambulatory Visit: Payer: Self-pay

## 2020-03-18 DIAGNOSIS — I2102 ST elevation (STEMI) myocardial infarction involving left anterior descending coronary artery: Secondary | ICD-10-CM | POA: Diagnosis not present

## 2020-03-18 DIAGNOSIS — I4891 Unspecified atrial fibrillation: Secondary | ICD-10-CM | POA: Diagnosis not present

## 2020-03-18 DIAGNOSIS — Z7901 Long term (current) use of anticoagulants: Secondary | ICD-10-CM | POA: Diagnosis not present

## 2020-03-18 DIAGNOSIS — I236 Thrombosis of atrium, auricular appendage, and ventricle as current complications following acute myocardial infarction: Secondary | ICD-10-CM | POA: Diagnosis not present

## 2020-03-18 LAB — POCT INR: INR: 2.7 (ref 2.0–3.0)

## 2020-03-18 NOTE — Patient Instructions (Signed)
Description   - Continue on same dosage of warfarin 3 tablets every day - Recheck INR in 3 weeks  Coumadin Clinic [facility] [phone].

## 2020-03-26 ENCOUNTER — Other Ambulatory Visit: Payer: Self-pay

## 2020-03-26 ENCOUNTER — Encounter: Payer: BC Managed Care – PPO | Attending: Cardiology | Admitting: *Deleted

## 2020-03-26 DIAGNOSIS — I213 ST elevation (STEMI) myocardial infarction of unspecified site: Secondary | ICD-10-CM

## 2020-03-26 DIAGNOSIS — Z955 Presence of coronary angioplasty implant and graft: Secondary | ICD-10-CM | POA: Insufficient documentation

## 2020-03-26 NOTE — Progress Notes (Signed)
Daily Session Note  Patient Details  Name: Almus Woodham. MRN: [id number] Date of Birth: 04/03/1960 Referring Provider:   Flowsheet Row Cardiac Rehab from 01/30/2020 in [facility] Cardiac and Pulmonary Rehab  Referring Provider Loralie Champagne MD      Encounter Date: 03/26/2020  Check In:  Session Check In - 03/26/20 0937      Check-In   Supervising physician immediately available to respond to emergencies See telemetry face sheet for immediately available ER MD    Location [facility]-Cardiac & Pulmonary Rehab    Staff Present Heath Lark, RN, BSN, Jacklynn Bue, MS Exercise Physiologist;Amanda Oletta Darter, [place], ACSM CEP, Exercise Physiologist    Virtual Visit No    Medication changes reported     No    Fall or balance concerns reported    No    Warm-up and Cool-down Performed on first and last piece of equipment    Resistance Training Performed Yes    VAD Patient? No    PAD/SET Patient? No      Pain Assessment   Currently in Pain? No/denies              Social History   Tobacco Use  Smoking Status Never Smoker  Smokeless Tobacco Never Used    Goals Met:  Independence with exercise equipment Exercise tolerated well No report of cardiac concerns or symptoms  Goals Unmet:  Not Applicable  Comments: Pt able to follow exercise prescription today without complaint.  Will continue to monitor for progression. Returns today after a death in his family.   Dr. Emily Filbert is Medical Director for [place] and [facility].

## 2020-03-28 ENCOUNTER — Other Ambulatory Visit: Payer: Self-pay

## 2020-03-28 DIAGNOSIS — Z955 Presence of coronary angioplasty implant and graft: Secondary | ICD-10-CM

## 2020-03-28 DIAGNOSIS — I213 ST elevation (STEMI) myocardial infarction of unspecified site: Secondary | ICD-10-CM

## 2020-03-28 NOTE — Progress Notes (Signed)
Daily Session Note  Patient Details  Name: Casey [name]. MRN: [id number] Date of Birth: 12/28/1960 Referring Provider:   Flowsheet Row Cardiac Rehab from 01/30/2020 in [facility] Cardiac and Pulmonary Rehab  Referring Provider Loralie Champagne MD      Encounter Date: 03/28/2020  Check In:  Session Check In - 03/28/20 0938      Check-In   Supervising physician immediately available to respond to emergencies See telemetry face sheet for immediately available ER MD    Location [facility]-Cardiac & Pulmonary Rehab    Staff Present Birdie Sons, MPA, RN;Melissa Caiola RDN, Rowe Pavy, BA, ACSM CEP, Exercise Physiologist    Virtual Visit No    Medication changes reported     No    Fall or balance concerns reported    No    Warm-up and Cool-down Performed on first and last piece of equipment    Resistance Training Performed Yes    VAD Patient? No    PAD/SET Patient? No      Pain Assessment   Currently in Pain? No/denies              Social History   Tobacco Use  Smoking Status Never Smoker  Smokeless Tobacco Never Used    Goals Met:  Independence with exercise equipment Exercise tolerated well No report of cardiac concerns or symptoms Strength training completed today  Goals Unmet:  Not Applicable  Comments: Pt able to follow exercise prescription today without complaint.  Will continue to monitor for progression.    Dr. Emily Filbert is Medical Director for [place] and [facility].

## 2020-03-29 ENCOUNTER — Other Ambulatory Visit: Payer: Self-pay

## 2020-03-29 ENCOUNTER — Ambulatory Visit: Payer: BC Managed Care – PPO | Admitting: Cardiology

## 2020-03-29 ENCOUNTER — Encounter: Payer: Self-pay | Admitting: Cardiology

## 2020-03-29 VITALS — BP 128/88 | HR 69 | Ht 73.0 in | Wt 201.4 lb

## 2020-03-29 DIAGNOSIS — I5022 Chronic systolic (congestive) heart failure: Secondary | ICD-10-CM

## 2020-03-29 NOTE — Patient Instructions (Signed)
Medication Instructions:  Your physician recommends that you continue on your current medications as directed. Please refer to the Current Medication list given to you today.  *If you need a refill on your cardiac medications before your next appointment, please call your pharmacy*   Lab Work: None ordered If you have labs (blood work) drawn today and your tests are completely normal, you will receive your results only by: Marland Kitchen MyChart Message (if you have MyChart) OR . A paper copy in the mail If you have any lab test that is abnormal or we need to change your treatment, we will call you to review the results.   Testing/Procedures: Your physician has requested that you have an echocardiogram in 3 months -- PRIOR to your follow up with Dr. Curt Bears. Echocardiography is a painless test that uses sound waves to create images of your heart. It provides your doctor with information about the size and shape of your heart and how well your heart's chambers and valves are working. This procedure takes approximately one hour. There are no restrictions for this procedure.   Follow-Up: At [facility], you and your health needs are our priority.  As part of our continuing mission to provide you with exceptional heart care, we have created designated Provider Care Teams.  These Care Teams include your primary Cardiologist (physician) and Advanced Practice Providers (APPs -  Physician Assistants and Nurse Practitioners) who all work together to provide you with the care you need, when you need it.  We recommend signing up for the patient portal called "MyChart".  Sign up information is provided on this After Visit Summary.  MyChart is used to connect with patients for Virtual Visits (Telemedicine).  Patients are able to view lab/test results, encounter notes, upcoming appointments, etc.  Non-urgent messages can be sent to your provider as well.   To learn more about what you can do with MyChart, go to  [email or web address].    Your next appointment:   3 month(s)  The format for your next appointment:   In Person  Provider:   Allegra Lai, MD    Thank you for choosing [facility]!!   Trinidad Curet, RN [phone]   Other Instructions   Cardioverter Defibrillator Implantation An implantable cardioverter defibrillator (ICD) is a device that identifies and corrects abnormal heart rhythms. Cardioverter defibrillator implantation is a surgery to place an ICD under the skin in the chest or abdomen. An ICD has a battery, a small computer (pulse generator), and wires (leads) that go into the heart. The ICD detects and corrects two types of dangerous irregular heart rhythms (arrhythmias):  A rapid heart rhythm in the lower chambers of the heart (ventricles). This is called ventricular tachycardia.  The ventricles contracting in an uncoordinated way. This is called ventricular fibrillation. There are different types of ICDs, and the electrical signals from the ICD can be programmed differently based on the condition being treated. The electrical signals from the ICD can be low-energy pulses, high-energy shocks, or a combination of the two. The low-energy pulses are generally used to restore the heartbeat to normal when it is either too slow (bradycardia) or too fast. These pulses are painless. The high-energy shocks are used to treat abnormal rhythms such as ventricular tachycardia or ventricular fibrillation. This shock may feel like a strong jolt in the chest. Your health care provider may recommend an ICD if you have:  Had a ventricular arrhythmia in the past.  A damaged heart because of a  disease or heart condition.  A weakened heart muscle from a heart attack or cardiac arrest.  A congenital heart defect.  Long QT syndrome, which is a disorder of the heart's electrical system.  Brugada syndrome, which is a condition that causes a disruption of the heart's normal  rhythm. Tell a health care provider about:  Any allergies you have.  All medicines you are taking, including vitamins, herbs, eye drops, creams, and over-the-counter medicines.  Any problems you or family members have had with anesthetic medicines.  Any blood disorders you have.  Any surgeries you have had.  Any medical conditions you have.  Whether you are pregnant or may be pregnant. What are the risks? Generally, this is a safe procedure. However, problems may occur, including:  Infection.  Bleeding.  Allergic reactions to medicines used during the procedure.  Blood clots.  Swelling or bruising.  Damage to nearby structures or organs, such as nerves, lungs, blood vessels, or the heart where the ICD leads or pulse generator is implanted. What happens before the procedure? Staying hydrated Follow instructions from your health care provider about hydration, which may include:  Up to 2 hours before the procedure - you may continue to drink clear liquids, such as water, clear fruit juice, black coffee, and plain tea.   Eating and drinking restrictions Follow instructions from your health care provider about eating and drinking, which may include:  8 hours before the procedure - stop eating heavy meals or foods, such as meat, fried foods, or fatty foods.  6 hours before the procedure - stop eating light meals or foods, such as toast or cereal.  6 hours before the procedure - stop drinking milk or drinks that contain milk.  2 hours before the procedure - stop drinking clear liquids. Medicines Ask your health care provider about:  Changing or stopping your regular medicines. This is especially important if you are taking diabetes medicines or blood thinners.  Taking medicines such as aspirin and ibuprofen. These medicines can thin your blood. Do not take these medicines unless your health care provider tells you to take them.  Taking over-the-counter medicines,  vitamins, herbs, and supplements. Tests You may have an exam or testing. These may include:  Blood tests.  A test to check the electrical signals in your heart (electrocardiogram, ECG).  Imaging tests, such as a chest X-ray.  Echocardiogram. This is an ultrasound of your heart to evaluate your heart structures and function.  An event monitor or Holter monitor to wear at home. General instructions  Do not use any products that contain nicotine or tobacco for at least 4 weeks before the procedure. These products include cigarettes, chewing tobacco, and vaping devices, such as e-cigarettes. If you need help quitting, ask your health care provider.  Ask your health care provider: ? How your procedure site will be marked. ? What steps will be taken to help prevent infection. These may include:  Removing hair at the surgery site.  Washing skin with a germ-killing soap.  Taking antibiotic medicine.  You may be asked to shower with a germ-killing soap.  Plan to have a responsible adult take you home from the hospital or clinic. What happens during the procedure?  Small monitors will be put on your body. They will be used to check your heart rate, blood pressure, and oxygen level.  A pair of sticky pads (defibrillator pads) may be placed on your back and chest. These pads are able to pace your heart  as needed during the procedure.  An IV will be inserted into one of your veins.  You will be given one or more of the following: ? A medicine to help you relax (sedative). ? A medicine to numb the area (local anesthetic). ? A medicine to make you fall asleep(general anesthetic).  A small incision will be made to create a deep pocket under the skin of your chest or abdomen.  Leads will be guided through a blood vessel into your heart and attached to your heart muscles. Depending on the ICD, the leads may go into one ventricle, or they may go into both ventricles and into an upper  chamber of the heart. An X-ray machine (fluoroscope) will be used to help guide the leads. The other end of the leads will be attached to the pulse generator.  The pulse generator will be placed into the pocket under the skin.  The ICD will be tested, and your health care provider will program the ICD for the condition being treated.  The incision will be closed with stitches (sutures), skin glue, adhesive strips, or staples.  A bandage (dressing) will be placed over the incision. The procedure may vary among health care providers and hospitals.   What happens after the procedure?  Your blood pressure, heart rate, breathing rate, and blood oxygen level will be monitored until you leave the hospital or clinic. Your health care provider will also monitor your ICD to make sure it is working properly.  A chest X-ray will be taken to check that the ICD is in the right place.  Do not raise the arm on the side of your procedure higher than your shoulder for as long as told by your health care provider. This is usually at least 6 weeks.  You may be given an identification card explaining that you have an ICD.  You will be given a remote home monitoring device to use with your ICD to allow your device to communicate with your clinic. Summary  An implantable cardioverter defibrillator (ICD) is a device that identifies and corrects abnormal heart rhythms. Cardioverter defibrillator implantation is a surgery to place an ICD under the skin in the chest or abdomen.  An ICD consists of a battery, a small computer (pulse generator), and wires (leads) that go into the heart.  During the procedure, the ICD will be tested, and your health care provider will program the ICD for the condition being treated.  After the procedure, a chest X-ray will be taken to check that the ICD is in the right place. This information is not intended to replace advice given to you by your health care provider. Make sure you  discuss any questions you have with your health care provider. Document Revised: 08/02/2019 Document Reviewed: 08/02/2019 Elsevier Patient Education  2021 [employer].     Cardiac Ablation Cardiac ablation is a procedure to destroy (ablate) some heart tissue that is sending bad signals. These bad signals cause problems in heart rhythm. The heart has many areas that make these signals. If there are problems in these areas, they can make the heart beat in a way that is not normal. Destroying some tissues can help make the heart rhythm normal. Tell your doctor about:  Any allergies you have.  All medicines you are taking. These include vitamins, herbs, eye drops, creams, and over-the-counter medicines.  Any problems you or family members have had with medicines that make you fall asleep (anesthetics).  Any blood disorders  you have.  Any surgeries you have had.  Any medical conditions you have, such as kidney failure.  Whether you are pregnant or may be pregnant. What are the risks? This is a safe procedure. But problems may occur, including:  Infection.  Bruising and bleeding.  Bleeding into the chest.  Stroke or blood clots.  Damage to nearby areas of your body.  Allergies to medicines or dyes.  The need for a pacemaker if the normal system is damaged.  Failure of the procedure to treat the problem. What happens before the procedure? Medicines Ask your doctor about:  Changing or stopping your normal medicines. This is important.  Taking aspirin and ibuprofen. Do not take these medicines unless your doctor tells you to take them.  Taking other medicines, vitamins, herbs, and supplements. General instructions  Follow instructions from your doctor about what you cannot eat or drink.  Plan to have someone take you home from the hospital or clinic.  If you will be going home right after the procedure, plan to have someone with you for 24 hours.  Ask your doctor  what steps will be taken to prevent infection. What happens during the procedure?  An IV tube will be put into one of your veins.  You will be given a medicine to help you relax.  The skin on your neck or groin will be numbed.  A cut (incision) will be made in your neck or groin. A needle will be put through your cut and into a large vein.  A tube (catheter) will be put into the needle. The tube will be moved to your heart.  Dye may be put through the tube. This helps your doctor see your heart.  Small devices (electrodes) on the tube will send out signals.  A type of energy will be used to destroy some heart tissue.  The tube will be taken out.  Pressure will be held on your cut. This helps stop bleeding.  A bandage will be put over your cut. The exact procedure may vary among doctors and hospitals.   What happens after the procedure?  You will be watched until you leave the hospital or clinic. This includes checking your heart rate, breathing rate, oxygen, and blood pressure.  Your cut will be watched for bleeding. You will need to lie still for a few hours.  Do not drive for 24 hours or as long as your doctor tells you. Summary  Cardiac ablation is a procedure to destroy some heart tissue. This is done to treat heart rhythm problems.  Tell your doctor about any medical conditions you may have. Tell him or her about all medicines you are taking to treat them.  This is a safe procedure. But problems may occur. These include infection, bruising, bleeding, and damage to nearby areas of your body.  Follow what your doctor tells you about food and drink. You may also be told to change or stop some of your medicines.  After the procedure, do not drive for 24 hours or as long as your doctor tells you. This information is not intended to replace advice given to you by your health care provider. Make sure you discuss any questions you have with your health care provider. Document  Revised: 01/05/2019 Document Reviewed: 01/05/2019 Elsevier Patient Education  2021 [name].

## 2020-03-29 NOTE — Progress Notes (Signed)
Electrophysiology Office Note   Date:  03/29/2020   ID:  Casey Mages., DOB [birth year], MRN [id number]  PCP:  Dion Body, MD  Cardiologist:  Aundra Dubin Primary Electrophysiologist:  Coty Student Meredith Leeds, MD    Chief Complaint: CHF   History of Present Illness: Casey [name]. is a 60 y.o. male who is being seen today for the evaluation of CHF at the request of Larey Dresser, MD. Presenting today for electrophysiology evaluation.  He has a history significant for renal transplant 2011, coronary artery disease status post anterior MI and ischemic cardiomyopathy.  In early 2021 he developed chest pain.  Did not go to the emergency room.  He had pain for greater than 1 day.  He was found to have an occluded LAD and [name] disease in the RCA as well as [name] circumflex disease.  He had drug-eluting stent placed to the LAD.  He developed cardiogenic shock and suspected septic shock.  He developed atrial fibrillation with rapid rates and was started on amiodarone which converted him to sinus rhythm.  Today, he denies symptoms of palpitations, chest pain, shortness of breath, orthopnea, PND, lower extremity edema, claudication, dizziness, presyncope, syncope, bleeding, or neurologic sequela. The patient is tolerating medications without difficulties.    Past Medical History:  Diagnosis Date  . CHF (congestive heart failure) ([place])   . Chronic kidney disease 04/2009   Kidney Transplant  . Diabetes mellitus   . GERD (gastroesophageal reflux disease)    as needed reflux  . Heart attack ([place]) 02/02/2020  . Hypertension   . 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   Past Surgical History:  Procedure Laterality Date  . AV FISTULA PLACEMENT  03/09/2011   Procedure: ARTERIOVENOUS (AV) FISTULA CREATION;  Surgeon: Rosetta Posner, MD;  Location: 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 CORONARY/GRAFT ACUTE  MI REVASCULARIZATION N/A 12/03/2019   Procedure: Coronary/Graft Acute MI Revascularization;  Surgeon: Wellington Hampshire, MD;  Location: [place] [facility];  Service: Cardiovascular;  Laterality: N/A;  . DIALYSIS FISTULA CREATION     last used 04/2009  . INSERTION OF DIALYSIS CATHETER     cordis dialysis catheter placement  . KIDNEY TRANSPLANT  2011  . LEFT HEART CATH AND CORONARY ANGIOGRAPHY N/A 12/03/2019   Procedure: LEFT HEART CATH AND CORONARY ANGIOGRAPHY;  Surgeon: Wellington Hampshire, MD;  Location: [facility] [facility];  Service: Cardiovascular;  Laterality: N/A;  . RIGHT HEART CATH N/A 12/05/2019   Procedure: RIGHT HEART CATH;  Surgeon: Larey Dresser, MD;  Location: [facility];  Service: Cardiovascular;  Laterality: N/A;     Current Outpatient Medications  Medication Sig Dispense Refill  . amiodarone (PACERONE) 200 MG tablet Take 1 tablet (200 mg total) by mouth daily. 30 tablet 6  . carvedilol (COREG) 12.5 MG tablet Take 1 tablet (12.5 mg total) by mouth 2 (two) times daily. 180 tablet 3  . clopidogrel (PLAVIX) 75 MG tablet Take 1 tablet (75 mg total) by mouth daily. 30 tablet 6  . dapagliflozin propanediol (FARXIGA) 10 MG TABS tablet Take 1 tablet (10 mg total) by mouth daily before breakfast. 90 tablet 3  . glipiZIDE (GLUCOTROL) 10 MG tablet Take 10 mg by mouth 2 (two) times daily before a meal.    . isosorbide-hydrALAZINE (BIDIL) 20-37.5 MG tablet Take 2 tablets by mouth 3 (three) times daily.  180 tablet 6  . loratadine (CLARITIN) 10 MG tablet Take 10 mg by mouth daily as needed for allergies.    . nitroGLYCERIN (NITROSTAT) 0.4 MG SL tablet Place 1 tablet (0.4 mg total) under the tongue every 5 (five) minutes as needed for chest pain. 100 tablet 3  . pantoprazole (PROTONIX) 40 MG tablet Take 1 tablet (40 mg total) by mouth daily. 30 tablet 6  . predniSONE (DELTASONE) 5 MG tablet Take 1 tablet (5 mg total) by mouth daily with breakfast.    . rosuvastatin (CRESTOR) 40 MG  tablet Take 1 tablet (40 mg total) by mouth daily. 30 tablet 6  . simethicone (MYLICON) 0000000 MG chewable tablet Chew 125 mg by mouth every 6 (six) hours as needed for flatulence.    . tacrolimus (PROGRAF) 1 MG capsule Take 4 mg by mouth 2 (two) times daily.    Marland Kitchen torsemide (DEMADEX) 20 MG tablet Take 1 tablet (20 mg total) by mouth as needed. 3 pound weight gain in 24 hours 30 tablet 6  . warfarin (COUMADIN) 1 MG tablet TAKE 3 TO 4 TABLETS DAILY AS DIRECTED BY ANTICOAGULATION CLINIC. 110 tablet 1   No current facility-administered medications for this visit.    Allergies:   Patient has no known allergies.   Social History:  The patient  reports that he has never smoked. He has never used smokeless tobacco. He reports current alcohol use. He reports that he does not use drugs.   Family History:  The patient's family history includes Cancer in his father; Diabetes in his mother; Hyperlipidemia in his mother.    ROS:  Please see the history of present illness.   Otherwise, review of systems is positive for none.   All other systems are reviewed and negative.    PHYSICAL EXAM: VS:  BP 128/88   Pulse 69   Ht '6\' 1"'$  (1.854 m)   Wt 201 lb 6.4 oz (91.4 kg)   SpO2 93%   BMI 26.57 kg/m  , BMI Body mass index is 26.57 kg/m. GEN: Well nourished, well developed, in no acute distress  HEENT: normal  Neck: no JVD, carotid bruits, or masses Cardiac: RRR; no murmurs, rubs, or gallops,no edema  Respiratory:  clear to auscultation bilaterally, normal work of breathing GI: soft, nontender, nondistended, + BS MS: no deformity or atrophy  Skin: warm and dry Neuro:  Strength and sensation are intact Psych: euthymic mood, full affect  EKG:  EKG is ordered today. Personal review of the ekg ordered shows sinus rhythm, rate 69  Recent Labs: 12/07/2019: Magnesium 2.8 03/08/2020: ALT 24; BUN 39; Creatinine, Ser 2.79; Hemoglobin 12.7; Platelets 238; Potassium 3.8; Sodium 138; TSH 0.955    Lipid Panel      Component Value Date/Time   CHOL 147 12/26/2019 1113   TRIG 181 (H) 12/26/2019 1113   HDL 43 12/26/2019 1113   CHOLHDL 3.4 12/26/2019 1113   VLDL 36 12/26/2019 1113   LDLCALC 68 12/26/2019 1113     Wt Readings from Last 3 Encounters:  03/29/20 201 lb 6.4 oz (91.4 kg)  03/08/20 193 lb 9.6 oz (87.8 kg)  02/06/20 198 lb 6.4 oz (90 kg)      Other studies Reviewed: Additional studies/ records that were reviewed today include: TTE 03/08/20  Review of the above records today demonstrates:  1. Left ventricular ejection fraction, by estimation, is 30 to 35%. The  left ventricle has moderately decreased function. The left ventricle  demonstrates regional wall motion abnormalities  with mid-apical  anteroseptal akinesis, apical  anterior/lateral/inferior akinesis, and akinesis of the true apex. No LV  thrombus noted. The left ventricular internal cavity size was mildly  dilated. Left ventricular diastolic parameters are consistent with Grade  II diastolic dysfunction  (pseudonormalization).  2. Right ventricular systolic function is normal. The right ventricular  size is normal. Tricuspid regurgitation signal is inadequate for assessing  PA pressure.  3. The mitral valve is normal in structure. Trivial mitral valve  regurgitation. No evidence of mitral stenosis.  4. The aortic valve is normal in structure. Aortic valve regurgitation is  not visualized. Mild aortic valve sclerosis is present, with no evidence  of aortic valve stenosis.  5. The inferior vena cava is normal in size with greater than 50%  respiratory variability, suggesting right atrial pressure of 3 mmHg.   ASSESSMENT AND PLAN:  1.  Paroxysmal atrial fibrillation: Currently on amiodarone and warfarin.  At this point, it would be good to get him off of amiodarone.  Due to that, we'll plan for ablation.  Risks and benefits were discussed which include bleeding, tamponade, heart block, stroke, damage to chest organs.   He would like to recheck an echo in 6 to 12 weeks to determine if his ejection fraction is improved.  2.  Chronic systolic heart failure due to ischemic cardiomyopathy: Ejection fraction 30 to 35%.  Currently on Farxiga, carvedilol, BiDil.  He would likely benefit from ICD implant.  Risks and benefits were discussed risk of bleeding, tamponade, infection, pneumothorax.  At this point, he is unsure of whether or not he wants to go forward with ablation.  We have sent him home with information about the procedure.  3.  Coronary artery disease: Status post drug-eluting stent to the LAD.  Currently on Plavix.  Plan per primary cardiology.  4.  CKD stage III: Is status post renal transplant.  Casey [name] need to discuss with nephrology whether or not he can get contrast for a CT.  Case discussed with primary cardiology  Current medicines are reviewed at length with the patient today.   The patient does not have concerns regarding his medicines.  The following changes were made today:  none  Labs/ tests ordered today include:  Orders Placed This Encounter  Procedures  . EKG 12-Lead  . ECHOCARDIOGRAM COMPLETE     Disposition:   FU with Casey [name] 3 months  Signed, Kandie Keiper Meredith Leeds, MD  03/29/2020 1:35 PM     [address] [place] [address] [phone] (office) [phone] (fax)

## 2020-04-02 ENCOUNTER — Other Ambulatory Visit: Payer: Self-pay

## 2020-04-02 ENCOUNTER — Encounter: Payer: BC Managed Care – PPO | Admitting: *Deleted

## 2020-04-02 DIAGNOSIS — I213 ST elevation (STEMI) myocardial infarction of unspecified site: Secondary | ICD-10-CM | POA: Diagnosis not present

## 2020-04-02 DIAGNOSIS — Z955 Presence of coronary angioplasty implant and graft: Secondary | ICD-10-CM

## 2020-04-02 NOTE — Progress Notes (Incomplete)
***In Progress***  PCP: Dion Body, MD Cardiology: Dr. Aundra Dubin  HPI:   60 y.o. with history of renal transplant in 2011, CAD s/p anterior MI, and ischemic cardiomyopathy presents for followup of CHF and CAD.  Patient had his renal transplant at [facility], and has been followed by [facility] as well as [facility] since that time.  He had been doing well until 11/2019.  In early 11/2019, he developed chest pain.  He did not go immediately to the ER, he presented when the pain had continued for > 1 day.  He was found to have acute anterior MI with late presentation. He went for cath, LAD was occluded and there was severe diffuse disease in the RCA as well as severe disease in the LCx system.  He had DES to the LAD.  He subsequently developed cardiogenic shock as well as suspected septic shock, possible from gut source.  Abdominal imaging showed profound ileus.  He was taken to the OR for Impella 5.5 placement, but this was deferred due to the finding of LV thrombus on intra-op TEE.  He also developed atrial fibrillation with RVR, controlled by amiodarone and eventually converted back to NSR.  He was maintained on milrinone 0.25 with gradual improvement.  However, he developed AKI, likely due to contrast as well as cardiorenal. Creatinine went up to 5.  Creatinine gradually improved down to 3.38 at discharge, and we were able to discontinue milrinone.  Ileus also gradually resolved.  Echo in the hospital showed EF 20-25%.  He is wearing a Lifevest.   Echo was done on 03/08/20 and reviewed, EF 30-35%, periapical akinesis, no LV thrombus, normal RV.   Patient returned for follow up on 03/08/20.  Reported no dyspnea walking up 1 flight of stairs or walking on flat ground.  No chest pain.  No BRBPR/melena.  Weight was down 2 lbs.  Patient was doing cardiac rehab at [facility].   Today he returns to HF clinic for pharmacist medication titration. At last visit with MD, spironolactone was not initated due to  serum creatinine of 2.79 on 03/08/20 and no other medication changes were made.     Overall feeling ***. Dizziness, lightheadedness, fatigue:  Chest pain or palpitations:  How is your breathing?: *** SOB: Able to complete all ADLs. Activity level ***  Weight at home pounds. Takes furosemide/torsemide/bumex *** mg *** daily.  LEE PND/Orthopnea  Appetite *** Low-salt diet:   Physical Exam Cost/affordability of meds   HF Medications: Carvedilol 12.5 mg BID Farxiga 10 mg daily  Isosorbide dinitrate/hydralazine (Bidil) 40-75 mg TID Torsemide 20 mg PRN 3 pound weight gain in 24 hours   Has the patient been experiencing any side effects to the medications prescribed?  {YES NO:22349}  Does the patient have any problems obtaining medications due to transportation or finances?   {YES NO:22349}  Understanding of regimen: {excellent/good/fair/poor:19665} Understanding of indications: {excellent/good/fair/poor:19665} Potential of compliance: {excellent/good/fair/poor:19665} Patient understands to avoid NSAIDs. Patient understands to avoid decongestants.    Pertinent Lab Values (03/08/20) . Serum creatinine 2.79, BUN 39, Potassium 3.8, Sodium 138  Vital Signs: . Weight: *** (last clinic weight: ***) . Blood pressure: ***  . Heart rate: ***   Assessment/Plan: 1. CAD: S/p late presentation anterior MI in 11/2019 with DES to LAD.  He has residual severe disease in the RCA which is not revascularizable.   He has 95% mid-distal LCx stenosis and 80% OM3 stenosis that could potentially be intervened upon.  No chest pain.  -  Continue cardiac rehab.   - Continue clopidogrel for up to 1 year post-PCI.  He is now off ASA as he is also on warfarin.  Can stop clopidogrel at 6 months if he develops issues with bleeding.   - Continue rosuvastatin, LDL 68 on 12/26/19.  - If creatinine decreases back to baseline around 1, will arrange for PCI to LCx system.  For now, medical management.   2.  Atrial fibrillation: Paroxysmal.  He was in NSR on 03/08/20.  - Continue amiodarone 200 mg daily. - Continue warfarin.  - Consider eventual atrial fibrillation ablation so he can safely stop amiodarone.  Was previously referred to EP.   3. Chronic systolic CHF: Ischemic cardiomyopathy.  Echo in 11/2019 with EF 20-25%.   - Echo 03/08/20 EF 30-35%. - NYHA class II, euvolemic upon exam.   - Continue torsemide PRN.  - Continue carvedilol 12.5 mg BID.  - Contiue Farxiga 10 mg daily. - Continue Bidil 40-75 mg TID  - Follow up  4. LV thrombus: No thrombus on echo from 03/08/20.   - Now on warfarin.   5. CKD stage 3 in setting of renal transplant  6. Type 2 diabetes: On Farxiga.   Audry Riles, PharmD, BCPS, BCCP, CPP Heart Failure Clinic Pharmacist [phone]

## 2020-04-02 NOTE — Progress Notes (Signed)
Daily Session Note  Patient Details  Name: Casey [name]. MRN: [id number] Date of Birth: 01-14-1961 Referring Provider:   Flowsheet Row Cardiac Rehab from 01/30/2020 in [facility] Cardiac and Pulmonary Rehab  Referring Provider Loralie Champagne MD      Encounter Date: 04/02/2020  Check In:  Session Check In - 04/02/20 0947      Check-In   Supervising physician immediately available to respond to emergencies See telemetry face sheet for immediately available ER MD    Location [facility]-Cardiac & Pulmonary Rehab    Staff Present Heath Lark, RN, BSN, Lance Sell, BA, ACSM CEP, Exercise Physiologist;Kara Eliezer Bottom, MS Exercise Physiologist    Virtual Visit No    Medication changes reported     No    Fall or balance concerns reported    No    Warm-up and Cool-down Performed on first and last piece of equipment    Resistance Training Performed Yes    VAD Patient? No    PAD/SET Patient? No      Pain Assessment   Currently in Pain? No/denies              Social History   Tobacco Use  Smoking Status Never Smoker  Smokeless Tobacco Never Used    Goals Met:  Independence with exercise equipment Exercise tolerated well No report of cardiac concerns or symptoms  Goals Unmet:  Not Applicable  Comments: Pt able to follow exercise prescription today without complaint.  Will continue to monitor for progression.    Dr. Emily Filbert is Medical Director for [place] and [facility].

## 2020-04-03 ENCOUNTER — Ambulatory Visit (HOSPITAL_COMMUNITY)
Admission: RE | Admit: 2020-04-03 | Discharge: 2020-04-03 | Disposition: A | Discharge status: Home / Self Care | Payer: BC Managed Care – PPO | Source: Ambulatory Visit | Attending: Internal Medicine | Admitting: Internal Medicine

## 2020-04-03 ENCOUNTER — Other Ambulatory Visit: Payer: Self-pay

## 2020-04-03 VITALS — BP 136/86 | HR 65 | Wt 197.8 lb

## 2020-04-03 DIAGNOSIS — Z940 Kidney transplant status: Secondary | ICD-10-CM | POA: Insufficient documentation

## 2020-04-03 DIAGNOSIS — I5022 Chronic systolic (congestive) heart failure: Secondary | ICD-10-CM | POA: Diagnosis not present

## 2020-04-03 DIAGNOSIS — Z7901 Long term (current) use of anticoagulants: Secondary | ICD-10-CM | POA: Diagnosis not present

## 2020-04-03 DIAGNOSIS — N183 Chronic kidney disease, stage 3 unspecified: Secondary | ICD-10-CM | POA: Insufficient documentation

## 2020-04-03 DIAGNOSIS — I513 Intracardiac thrombosis, not elsewhere classified: Secondary | ICD-10-CM | POA: Diagnosis not present

## 2020-04-03 DIAGNOSIS — E119 Type 2 diabetes mellitus without complications: Secondary | ICD-10-CM | POA: Diagnosis not present

## 2020-04-03 DIAGNOSIS — Z7984 Long term (current) use of oral hypoglycemic drugs: Secondary | ICD-10-CM | POA: Diagnosis not present

## 2020-04-03 DIAGNOSIS — Z7902 Long term (current) use of antithrombotics/antiplatelets: Secondary | ICD-10-CM | POA: Diagnosis not present

## 2020-04-03 DIAGNOSIS — I480 Paroxysmal atrial fibrillation: Secondary | ICD-10-CM | POA: Insufficient documentation

## 2020-04-03 DIAGNOSIS — I252 Old myocardial infarction: Secondary | ICD-10-CM | POA: Insufficient documentation

## 2020-04-03 DIAGNOSIS — Z79899 Other long term (current) drug therapy: Secondary | ICD-10-CM | POA: Diagnosis not present

## 2020-04-03 DIAGNOSIS — I2510 Atherosclerotic heart disease of native coronary artery without angina pectoris: Secondary | ICD-10-CM | POA: Insufficient documentation

## 2020-04-03 LAB — BASIC METABOLIC PANEL
Anion gap: 9 (ref 5–15)
BUN: 36 mg/dL — ABNORMAL HIGH (ref 6–20)
CO2: 21 mmol/L — ABNORMAL LOW (ref 22–32)
Calcium: 9.6 mg/dL (ref 8.9–10.3)
Chloride: 109 mmol/L (ref 98–111)
Creatinine, Ser: 2.49 mg/dL — ABNORMAL HIGH (ref 0.61–1.24)
GFR, Estimated: 29 mL/min — ABNORMAL LOW (ref >60)
Glucose, Bld: 215 mg/dL — ABNORMAL HIGH (ref 70–99)
Potassium: 3.7 mmol/L (ref 3.5–5.1)
Sodium: 139 mmol/L (ref 135–145)

## 2020-04-03 MED ORDER — CARVEDILOL 12.5 MG PO TABS
18.7500 mg | ORAL_TABLET | Freq: Two times a day (BID) | ORAL | 3 refills | Status: DC
Start: 2020-04-03 — End: 2020-05-15

## 2020-04-03 NOTE — Progress Notes (Signed)
PCP: Dion Body, MD Cardiology: Dr. Aundra Dubin  HPI:  60 y.o. with history of renal transplant in 2011, CAD s/p anterior MI, and ischemic cardiomyopathy presents for followup of CHF and CAD.  Patient had his renal transplant at [facility], and has been followed by [facility] as well as [facility] since that time.  He had been doing well until 11/2019.  In early 11/2019, he developed chest pain.  He did not go immediately to the ER, he presented when the pain had continued for > 1 day.  He was found to have acute anterior MI with late presentation. He went for cath, LAD was occluded and there was severe diffuse disease in the RCA as well as severe disease in the LCx system.  He had DES to the LAD.  He subsequently developed cardiogenic shock as well as suspected septic shock, possible from gut source.  Abdominal imaging showed profound ileus.  He was taken to the OR for Impella 5.5 placement, but this was deferred due to the finding of LV thrombus on intra-op TEE.  He also developed atrial fibrillation with RVR, controlled by amiodarone and eventually converted back to NSR.  He was maintained on milrinone 0.25 with gradual improvement.  However, he developed AKI, likely due to contrast as well as cardiorenal. Creatinine went up to 5.  Creatinine gradually improved down to 3.38 at discharge, and we were able to discontinue milrinone.  Ileus also gradually resolved.  Echo in the hospital showed EF 20-25%.  He is wearing a Lifevest.   Echo was done on 03/08/20 and reviewed, EF 30-35%, periapical akinesis, no LV thrombus, normal RV.   Patient returned to HF Clinic for follow up on 03/08/20.  Reported no dyspnea walking up 1 flight of stairs or walking on flat ground.  No chest pain.  No BRBPR/melena.  Weight was down 2 lbs.  Patient was doing cardiac rehab at [facility].   Today he returns to HF clinic for pharmacist medication titration. At last visit with MD, patient was instructed to start spironolactone  12.5 mg daily. However, labs returned with a Scr increase from 2.43 to 2.79, so he was instructed not to start the spironolactone. Overall feeling good today. No dizziness or lightheadedness. No fatigue. No chest pain or palpitations. His breathing is good, he can walk 2.5 miles without getting SOB. Weight at home has been stable between 187 and 190 lbs. Has not needed any PRN torsemide. No LEE/PND/Orthopnea. His appetite is good. Patient has been taking all of his medications as prescribed and has been tolerating them well.       HF Medications: Carvedilol 12.5 mg BID Farxiga 10 mg daily  Bidil 20-37.5 mg 2 tablets TID Torsemide 20 mg PRN  Has the patient been experiencing any side effects to the medications prescribed?  No, patient is tolerating medications well.   Does the patient have any problems obtaining medications due to transportation or finances?   No, patient has Environmental education officer.   Understanding of regimen: good Understanding of indications: good Potential of compliance: good Patient understands to avoid NSAIDs. Patient understands to avoid decongestants.    Pertinent Lab Values (04/03/20) . Serum creatinine 2.49, BUN 36, Potassium 3.7, Sodium 139  Vital Signs: . Weight: 197.8 lbs (last clinic weight: 193.6 lbs) . Blood pressure: 136/86  . Heart rate: 65   Assessment/Plan: 1. CAD: S/p late presentation anterior MI in 11/2019 with DES to LAD.  He has residual severe disease in  the RCA which is not revascularizable.   He has 95% mid-distal LCx stenosis and 80% OM3 stenosis that could potentially be intervened upon.  No chest pain.  - Continue cardiac rehab.   - Continue clopidogrel. - Continue rosuvastatin, LDL 68 on 12/26/19.   2. Atrial fibrillation: Paroxysmal.  He was in NSR on 03/08/20.  - Continue amiodarone 200 mg daily. - Continue warfarin.  - Consider eventual atrial fibrillation ablation so he can safely stop amiodarone.  Was  previously referred to EP.   3. Chronic systolic CHF: Ischemic cardiomyopathy.  Echo in 11/2019 with EF 20-25%.   - Echo 03/08/20 EF 30-35%. Normal RV - NYHA class I, euvolemic on exam.   - Continue torsemide 20 mg PRN.   - Increase carvedilol to 18.75 mg BID.  - Contiue Farxiga 10 mg daily. - Continue Bidil 20-37.5 mg 2 tablets TID - Follow up with Dr. Aundra Dubin on 05/15/20.   4. LV thrombus: No thrombus on echo from 03/08/20.   - Now on warfarin.   5. CKD stage 3 in setting of renal transplant  6. Type 2 diabetes: On Farxiga.   Audry Riles, PharmD, BCPS, BCCP, CPP Heart Failure Clinic Pharmacist [phone]

## 2020-04-03 NOTE — Patient Instructions (Signed)
It was a pleasure seeing you today!  MEDICATIONS: -We are changing your medications today -Increase carvedilol to 18.75 mg (1.5 tablets) twice daily.  -Call if you have questions about your medications.  LABS: -We will call you if your labs need attention.  NEXT APPOINTMENT: Return to clinic in 1 month with Dr. Aundra Dubin.  In general, to take care of your heart failure: -Limit your fluid intake to 2 Liters (half-gallon) per day.   -Limit your salt intake to ideally 2-3 grams (2000-3000 mg) per day. -Weigh yourself daily and record, and bring that "weight diary" to your next appointment.  (Weight gain of 2-3 pounds in 1 day typically means fluid weight.) -The medications for your heart are to help your heart and help you live longer.   -Please contact us before stopping any of your heart medications.  Call the clinic at [phone] with questions or to reschedule future appointments.

## 2020-04-04 DIAGNOSIS — I213 ST elevation (STEMI) myocardial infarction of unspecified site: Secondary | ICD-10-CM | POA: Diagnosis not present

## 2020-04-04 DIAGNOSIS — Z955 Presence of coronary angioplasty implant and graft: Secondary | ICD-10-CM

## 2020-04-04 NOTE — Progress Notes (Signed)
Daily Session Note  Patient Details  Name: Casey [name]. MRN: [id number] Date of Birth: [birth year] Referring Provider:   Flowsheet Row Cardiac Rehab from 01/30/2020 in [facility] Cardiac and Pulmonary Rehab  Referring Provider Loralie Champagne MD      Encounter Date: 04/04/2020  Check In:  Session Check In - 04/04/20 0917      Check-In   Supervising physician immediately available to respond to emergencies See telemetry face sheet for immediately available ER MD    Location [facility]-Cardiac & Pulmonary Rehab    Staff Present Birdie Sons, MPA, RN;Melissa Caiola RDN, Rowe Pavy, BA, ACSM CEP, Exercise Physiologist    Virtual Visit No    Medication changes reported     Yes    Comments increased carvedilol from 1 tab to 1.5 tabs    Fall or balance concerns reported    No    Warm-up and Cool-down Performed on first and last piece of equipment    Resistance Training Performed Yes    VAD Patient? No    PAD/SET Patient? No      Pain Assessment   Currently in Pain? No/denies              Social History   Tobacco Use  Smoking Status Never Smoker  Smokeless Tobacco Never Used    Goals Met:  Independence with exercise equipment Exercise tolerated well No report of cardiac concerns or symptoms Strength training completed today  Goals Unmet:  Not Applicable  Comments: Pt able to follow exercise prescription today without complaint.  Will continue to monitor for progression.    Dr. Emily Filbert is Medical Director for [place] and [facility].

## 2020-04-09 ENCOUNTER — Other Ambulatory Visit: Payer: Self-pay

## 2020-04-09 DIAGNOSIS — I213 ST elevation (STEMI) myocardial infarction of unspecified site: Secondary | ICD-10-CM | POA: Diagnosis not present

## 2020-04-09 DIAGNOSIS — Z955 Presence of coronary angioplasty implant and graft: Secondary | ICD-10-CM

## 2020-04-09 NOTE — Progress Notes (Signed)
Daily Session Note  Patient Details  Name: Casey [name]. MRN: [id number] Date of Birth: [birth year] Referring Provider:   Flowsheet Row Cardiac Rehab from 01/30/2020 in [facility] Cardiac and Pulmonary Rehab  Referring Provider Loralie Champagne MD      Encounter Date: 04/09/2020  Check In:  Session Check In - 04/09/20 0925      Check-In   Supervising physician immediately available to respond to emergencies See telemetry face sheet for immediately available ER MD    Location [facility]-Cardiac & Pulmonary Rehab    Staff Present Birdie Sons, MPA, Elveria Rising, BA, ACSM CEP, Exercise Physiologist;Kara Eliezer Bottom, MS Exercise Physiologist    Virtual Visit No    Medication changes reported     No    Fall or balance concerns reported    No    Warm-up and Cool-down Performed on first and last piece of equipment    Resistance Training Performed Yes    VAD Patient? No    PAD/SET Patient? No      Pain Assessment   Currently in Pain? No/denies              Social History   Tobacco Use  Smoking Status Never Smoker  Smokeless Tobacco Never Used    Goals Met:  Independence with exercise equipment Exercise tolerated well No report of cardiac concerns or symptoms Strength training completed today  Goals Unmet:  Not Applicable  Comments: Pt able to follow exercise prescription today without complaint.  Will continue to monitor for progression.    Dr. Emily Filbert is Medical Director for [place] and [facility].

## 2020-04-10 ENCOUNTER — Other Ambulatory Visit: Payer: Self-pay

## 2020-04-10 ENCOUNTER — Ambulatory Visit (INDEPENDENT_AMBULATORY_CARE_PROVIDER_SITE_OTHER): Payer: BC Managed Care – PPO

## 2020-04-10 ENCOUNTER — Encounter: Payer: Self-pay | Admitting: *Deleted

## 2020-04-10 DIAGNOSIS — I213 ST elevation (STEMI) myocardial infarction of unspecified site: Secondary | ICD-10-CM

## 2020-04-10 DIAGNOSIS — I2102 ST elevation (STEMI) myocardial infarction involving left anterior descending coronary artery: Secondary | ICD-10-CM

## 2020-04-10 DIAGNOSIS — Z7901 Long term (current) use of anticoagulants: Secondary | ICD-10-CM

## 2020-04-10 DIAGNOSIS — I236 Thrombosis of atrium, auricular appendage, and ventricle as current complications following acute myocardial infarction: Secondary | ICD-10-CM

## 2020-04-10 DIAGNOSIS — I4891 Unspecified atrial fibrillation: Secondary | ICD-10-CM

## 2020-04-10 DIAGNOSIS — Z955 Presence of coronary angioplasty implant and graft: Secondary | ICD-10-CM

## 2020-04-10 LAB — POCT INR: INR: 3.5 — AB (ref 2.0–3.0)

## 2020-04-10 NOTE — Progress Notes (Signed)
Cardiac Individual Treatment Plan  Patient Details  Name: Casey [name]. MRN: [id number] Date of Birth: 10-Feb-1961 Referring Provider:   Flowsheet Row Cardiac Rehab from 01/30/2020 in [facility] Cardiac and Pulmonary Rehab  Referring Provider Loralie Champagne MD      Initial Encounter Date:  Flowsheet Row Cardiac Rehab from 01/30/2020 in [facility] Cardiac and Pulmonary Rehab  Date 01/30/20      Visit Diagnosis: ST elevation myocardial infarction (STEMI), unspecified artery [facility])  Status post coronary artery stent placement  Patient's Home Medications on Admission:  Current Outpatient Medications:  .  amiodarone (PACERONE) 200 MG tablet, Take 1 tablet (200 mg total) by mouth daily., Disp: 30 tablet, Rfl: 6 .  carvedilol (COREG) 12.5 MG tablet, Take 1.5 tablets (18.75 mg total) by mouth 2 (two) times daily., Disp: 270 tablet, Rfl: 3 .  clopidogrel (PLAVIX) 75 MG tablet, Take 1 tablet (75 mg total) by mouth daily., Disp: 30 tablet, Rfl: 6 .  dapagliflozin propanediol (FARXIGA) 10 MG TABS tablet, Take 1 tablet (10 mg total) by mouth daily before breakfast., Disp: 90 tablet, Rfl: 3 .  glipiZIDE (GLUCOTROL) 10 MG tablet, Take 10 mg by mouth 2 (two) times daily before a meal., Disp: , Rfl:  .  isosorbide-hydrALAZINE (BIDIL) 20-37.5 MG tablet, Take 2 tablets by mouth 3 (three) times daily., Disp: 180 tablet, Rfl: 6 .  loratadine (CLARITIN) 10 MG tablet, Take 10 mg by mouth daily as needed for allergies., Disp: , Rfl:  .  nitroGLYCERIN (NITROSTAT) 0.4 MG SL tablet, Place 1 tablet (0.4 mg total) under the tongue every 5 (five) minutes as needed for chest pain., Disp: 100 tablet, Rfl: 3 .  pantoprazole (PROTONIX) 40 MG tablet, Take 1 tablet (40 mg total) by mouth daily., Disp: 30 tablet, Rfl: 6 .  predniSONE (DELTASONE) 5 MG tablet, Take 1 tablet (5 mg total) by mouth daily with breakfast., Disp: , Rfl:  .  rosuvastatin (CRESTOR) 40 MG tablet, Take 1 tablet (40 mg total) by mouth daily., Disp: 30  tablet, Rfl: 6 .  simethicone (MYLICON) 0000000 MG chewable tablet, Chew 125 mg by mouth every 6 (six) hours as needed for flatulence., Disp: , Rfl:  .  tacrolimus (PROGRAF) 1 MG capsule, Take 4 mg by mouth 2 (two) times daily., Disp: , Rfl:  .  torsemide (DEMADEX) 20 MG tablet, Take 1 tablet (20 mg total) by mouth as needed. 3 pound weight gain in 24 hours, Disp: 30 tablet, Rfl: 6 .  warfarin (COUMADIN) 1 MG tablet, TAKE 3 TO 4 TABLETS DAILY AS DIRECTED BY ANTICOAGULATION CLINIC., Disp: 110 tablet, Rfl: 1  Past Medical History: Past Medical History:  Diagnosis Date  . CHF (congestive heart failure) ([place])   . Chronic kidney disease 04/2009   Kidney Transplant  . Diabetes mellitus   . GERD (gastroesophageal reflux disease)    as needed reflux  . Heart attack ([place]) 02/02/2020  . Hypertension   . 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    Tobacco Use: Social History   Tobacco Use  Smoking Status Never Smoker  Smokeless Tobacco Never Used    Labs: Recent Review Flowsheet Data    Labs for ITP Cardiac and Pulmonary Rehab Latest Ref Rng & Units 12/12/2019 12/13/2019 12/14/2019 12/15/2019 12/26/2019   Cholestrol 0 - 200 mg/dL - - - - 147   LDLCALC 0 - 99 mg/dL - - - - 68   HDL >40 mg/dL - - - - 43  Trlycerides <150 mg/dL - - - - 181(H)   Hemoglobin A1c 4.8 - 5.6 % - - - - -   PHART 7.350 - 7.450 - - - - -   PCO2ART 32.0 - 48.0 mmHg - - - - -   HCO3 20.0 - 28.0 mmol/L - - - - -   TCO2 22 - 32 mmol/L - - - - -   ACIDBASEDEF 0.0 - 2.0 mmol/L - - - - -   O2SAT % 65.2 70.7 68.9 72.2 -       Exercise Target Goals: Exercise Program Goal: Individual exercise prescription set using results from initial 6 min walk test and THRR while considering  patient's activity barriers and safety.   Exercise Prescription Goal: Initial exercise prescription builds to 30-45 minutes a day of aerobic activity, 2-3 days per week.  Home exercise guidelines will be given to  patient during program as part of exercise prescription that the participant will acknowledge.   Education: Aerobic Exercise: - Group verbal and visual presentation on the components of exercise prescription. Introduces F.I.T.T principle from ACSM for exercise prescriptions.  Reviews F.I.T.T. principles of aerobic exercise including progression. Written material given at graduation. Flowsheet Row Cardiac Rehab from 03/28/2020 in [facility] Cardiac and Pulmonary Rehab  Education need identified 01/30/20      Education: Resistance Exercise: - Group verbal and visual presentation on the components of exercise prescription. Introduces F.I.T.T principle from ACSM for exercise prescriptions  Reviews F.I.T.T. principles of resistance exercise including progression. Written material given at graduation. Flowsheet Row Cardiac Rehab from 03/28/2020 in [facility] Cardiac and Pulmonary Rehab  Date 02/08/20  Educator [facility]  Instruction Review Code 1- [employer] Understanding       Education: Exercise & Equipment Safety: - Individual verbal instruction and demonstration of equipment use and safety with use of the equipment. Flowsheet Row Cardiac Rehab from 03/28/2020 in [facility] Cardiac and Pulmonary Rehab  Date 01/30/20  Educator [facility]  Instruction Review Code 1- Verbalizes Understanding      Education: Exercise Physiology & General Exercise Guidelines: - Group verbal and written instruction with models to review the exercise physiology of the cardiovascular system and associated critical values. Provides general exercise guidelines with specific guidelines to those with heart or lung disease.  Flowsheet Row Cardiac Rehab from 03/28/2020 in [facility] Cardiac and Pulmonary Rehab  Date 03/28/20  Educator [facility]  Instruction Review Code 1- Verbalizes Understanding      Education: Flexibility, Balance, Mind/Body Relaxation: - Group verbal and visual presentation with interactive activity on the components of exercise  prescription. Introduces F.I.T.T principle from ACSM for exercise prescriptions. Reviews F.I.T.T. principles of flexibility and balance exercise training including progression. Also discusses the mind body connection.  Reviews various relaxation techniques to help reduce and manage stress (i.e. Deep breathing, progressive muscle relaxation, and visualization). Balance handout provided to take home. Written material given at graduation.   Activity Barriers & Risk Stratification:  Activity Barriers & Cardiac Risk Stratification - 01/30/20 1527      Activity Barriers & Cardiac Risk Stratification   Activity Barriers Other (comment);Deconditioning    Comments Life Vest; Renal Transplant, L shoulder tingly    Cardiac Risk Stratification High           6 Minute Walk:  6 Minute Walk    Row Name 01/30/20 1526         6 Minute Walk   Phase Initial     Distance 1230 feet     Walk Time 6  minutes     # of Rest Breaks 0     MPH 2.33     METS 3.71     RPE 9     VO2 Peak 12.97     Symptoms No     Resting HR 75 bpm     Resting BP 128/70     Resting Oxygen Saturation  97 %     Exercise Oxygen Saturation  during 6 min walk 96 %     Max Ex. HR 101 bpm     Max Ex. BP 146/64     2 Minute Post BP 126/64            Oxygen Initial Assessment:   Oxygen Re-Evaluation:   Oxygen Discharge (Final Oxygen Re-Evaluation):   Initial Exercise Prescription:  Initial Exercise Prescription - 01/30/20 1500      Date of Initial Exercise RX and Referring Provider   Date 01/30/20    Referring Provider Loralie Champagne MD      Treadmill   MPH 2.3    Grade 1    Minutes 15    METs 3.08      NuStep   Level 4    SPM 80    Minutes 15    METs 3      Elliptical   Level 1    Speed 3.5    Minutes 15    METs 3      Biostep-RELP   Level 4    SPM 50    Minutes 15    METs 3      Prescription Details   Frequency (times per week) 2    Duration Progress to 30 minutes of continuous aerobic  without signs/symptoms of physical distress      Intensity   THRR 40-80% of Max Heartrate 109-144    Ratings of Perceived Exertion 11-13    Perceived Dyspnea 0-4      Progression   Progression Continue to progress workloads to maintain intensity without signs/symptoms of physical distress.      Resistance Training   Training Prescription Yes    Weight 4 lb    Reps 10-15           Perform Capillary Blood Glucose checks as needed.  Exercise Prescription Changes:  Exercise Prescription Changes    Row Name 01/30/20 1500 02/13/20 1200 03/07/20 0900 03/13/20 1400 03/27/20 1500     Response to Exercise   Blood Pressure (Admit) 128/70 142/76 -- 108/62 110/62   Blood Pressure (Exercise) 146/64 152/64 -- 114/66 118/62   Blood Pressure (Exit) 126/64 118/60 -- 110/60 112/64   Heart Rate (Admit) 75 bpm 73 bpm -- 69 bpm 83 bpm   Heart Rate (Exercise) 101 bpm 92 bpm -- 90 bpm 98 bpm   Heart Rate (Exit) 76 bpm 76 bpm -- 75 bpm 71 bpm   Oxygen Saturation (Admit) 97 % -- -- -- --   Oxygen Saturation (Exercise) 96 % -- -- -- --   Rating of Perceived Exertion (Exercise) 9 12 -- 12 11   Symptoms none none -- none none   Comments walk test results -- -- -- --   Duration -- -- -- Continue with 30 min of aerobic exercise without signs/symptoms of physical distress. Continue with 30 min of aerobic exercise without signs/symptoms of physical distress.   Intensity -- -- -- THRR unchanged THRR unchanged     Progression   Progression -- -- -- Continue to progress workloads to maintain intensity  without signs/symptoms of physical distress. Continue to progress workloads to maintain intensity without signs/symptoms of physical distress.   Average METs -- -- -- 2.7 3.55     Resistance Training   Training Prescription -- Yes -- Yes Yes   Weight -- 4 lb -- 4 lb 4 lb   Reps -- 10-15 -- 10-15 10-15     Interval Training   Interval Training -- -- -- No No     Treadmill   MPH -- 2.3 -- 2.4 --   Grade  -- 1 -- 0.5 --   Minutes -- 15 -- 15 --   METs -- 3.08 -- 3 --     NuStep   Level -- 4 -- -- 4   SPM -- 80 -- -- 80   Minutes -- 15 -- -- 15   METs -- 2.1 -- -- 4     T5 Nustep   Level -- -- -- 4 --   Minutes -- -- -- 15 --   METs -- -- -- 2.4 --     Biostep-RELP   Level -- -- -- -- 4   SPM -- -- -- -- 50   Minutes -- -- -- -- 15   METs -- -- -- -- 4     Home Exercise Plan   Plans to continue exercise at -- -- Home (comment)  walking, weights, staff videos Home (comment)  walking, weights, staff videos Home (comment)  walking, weights, staff videos   Frequency -- -- Add 2 additional days to program exercise sessions. Add 2 additional days to program exercise sessions. Add 2 additional days to program exercise sessions.   Initial Home Exercises Provided -- -- 03/07/20 03/07/20 03/07/20          Exercise Comments:  Exercise Comments    Row Name 02/08/20 0932 03/26/20 [id number]         Exercise Comments First full day of exercise!  Patient was oriented to gym and equipment including functions, settings, policies, and procedures.  Patient's individual exercise prescription and treatment plan were reviewed.  All starting workloads were established based on the results of the 6 minute walk test done at initial orientation visit.  The plan for exercise progression was also introduced and progression will be customized based on patient's performance and goals. Returns today after a death in his family             Exercise Goals and Review:  Exercise Goals    Row Name 01/30/20 1529             Exercise Goals   Increase Physical Activity Yes       Intervention Provide advice, education, support and counseling about physical activity/exercise needs.;Develop an individualized exercise prescription for aerobic and resistive training based on initial evaluation findings, risk stratification, comorbidities and participant's personal goals.       Expected Outcomes Short Term: Attend  rehab on a regular basis to increase amount of physical activity.;Long Term: Add in home exercise to make exercise part of routine and to increase amount of physical activity.;Long Term: Exercising regularly at least 3-5 days a week.       Increase Strength and Stamina Yes       Intervention Provide advice, education, support and counseling about physical activity/exercise needs.;Develop an individualized exercise prescription for aerobic and resistive training based on initial evaluation findings, risk stratification, comorbidities and participant's personal goals.       Expected Outcomes Short Term: Increase workloads  from initial exercise prescription for resistance, speed, and METs.;Short Term: Perform resistance training exercises routinely during rehab and add in resistance training at home;Long Term: Improve cardiorespiratory fitness, muscular endurance and strength as measured by increased METs and functional capacity (6MWT)       Able to understand and use rate of perceived exertion (RPE) scale Yes       Intervention Provide education and explanation on how to use RPE scale       Expected Outcomes Long Term:  Able to use RPE to guide intensity level when exercising independently;Short Term: Able to use RPE daily in rehab to express subjective intensity level       Able to understand and use Dyspnea scale Yes       Intervention Provide education and explanation on how to use Dyspnea scale       Expected Outcomes Long Term: Able to use Dyspnea scale to guide intensity level when exercising independently;Short Term: Able to use Dyspnea scale daily in rehab to express subjective sense of shortness of breath during exertion       Knowledge and understanding of Target Heart Rate Range (THRR) Yes       Intervention Provide education and explanation of THRR including how the numbers were predicted and where they are located for reference       Expected Outcomes Short Term: Able to state/look up  THRR;Short Term: Able to use daily as guideline for intensity in rehab;Long Term: Able to use THRR to govern intensity when exercising independently       Able to check pulse independently Yes       Intervention Provide education and demonstration on how to check pulse in carotid and radial arteries.;Review the importance of being able to check your own pulse for safety during independent exercise       Expected Outcomes Short Term: Able to explain why pulse checking is important during independent exercise;Long Term: Able to check pulse independently and accurately       Understanding of Exercise Prescription Yes       Intervention Provide education, explanation, and written materials on patient's individual exercise prescription       Expected Outcomes Short Term: Able to explain program exercise prescription;Long Term: Able to explain home exercise prescription to exercise independently              Exercise Goals Re-Evaluation :  Exercise Goals Re-Evaluation    Row Name 02/08/20 0932 03/07/20 0931 03/13/20 1407 03/26/20 0928       Exercise Goal Re-Evaluation   Exercise Goals Review Increase Physical Activity;Able to understand and use rate of perceived exertion (RPE) scale;Knowledge and understanding of Target Heart Rate Range (THRR);Understanding of Exercise Prescription;Increase Strength and Stamina;Able to understand and use Dyspnea scale;Able to check pulse independently Increase Physical Activity;Increase Strength and Stamina;Understanding of Exercise Prescription Increase Physical Activity;Increase Strength and Stamina;Understanding of Exercise Prescription Increase Physical Activity;Increase Strength and Stamina    Comments Reviewed RPE and dyspnea scales, THR and program prescription with pt today.  Pt voiced understanding and was given a copy of goals to take home. Hunberto returned today after being in [place] and finallizing things after his mother's passing.  He was able to exercise  while he was up there.  He was moving furniture and walking 4-5 blocks each day.  He does feel like his strength and stamina are starting to reocover some.  Reviewed home exercise with pt today.  Pt plans to walking and weights  at home for exercise.  We also talked about using the staff videos for bad weather days. Reviewed THR, pulse, RPE, sign and symptoms, pulse oximetery and when to call 911 or MD.  Also discussed weather considerations and indoor options.  Pt voiced understanding. Only one visit this month after his mother's passing.  He is back in [place] this week again.  He is planning to try to exercise more up there this time. Ikechi did do some push ups and squats while he was away.  He is gald to be back to [facility]!    Expected Outcomes Short: Use RPE daily to regulate intensity. Long: Follow program prescription in THR. Short: Start to add in exercise at home Long: Continue to exercise independently Short: Return to regular attendance  Long: Continue to improve stamina Short:  get back to regular attendance Long:  improve overall stamina           Discharge Exercise Prescription (Final Exercise Prescription Changes):  Exercise Prescription Changes - 03/27/20 1500      Response to Exercise   Blood Pressure (Admit) 110/62    Blood Pressure (Exercise) 118/62    Blood Pressure (Exit) 112/64    Heart Rate (Admit) 83 bpm    Heart Rate (Exercise) 98 bpm    Heart Rate (Exit) 71 bpm    Rating of Perceived Exertion (Exercise) 11    Symptoms none    Duration Continue with 30 min of aerobic exercise without signs/symptoms of physical distress.    Intensity THRR unchanged      Progression   Progression Continue to progress workloads to maintain intensity without signs/symptoms of physical distress.    Average METs 3.55      Resistance Training   Training Prescription Yes    Weight 4 lb    Reps 10-15      Interval Training   Interval Training No      NuStep   Level 4    SPM 80     Minutes 15    METs 4      Biostep-RELP   Level 4    SPM 50    Minutes 15    METs 4      Home Exercise Plan   Plans to continue exercise at Home (comment)   walking, weights, staff videos   Frequency Add 2 additional days to program exercise sessions.    Initial Home Exercises Provided 03/07/20           Nutrition:  Target Goals: Understanding of nutrition guidelines, daily intake of sodium '1500mg'$ , cholesterol '200mg'$ , calories 30% from fat and 7% or less from saturated fats, daily to have 5 or more servings of fruits and vegetables.  Education: All About Nutrition: -Group instruction provided by verbal, written material, interactive activities, discussions, models, and posters to present general guidelines for heart healthy nutrition including fat, fiber, MyPlate, the role of sodium in heart healthy nutrition, utilization of the nutrition label, and utilization of this knowledge for meal planning. Follow up email sent as well. Written material given at graduation.   Biometrics:  Pre Biometrics - 01/30/20 1530      Pre Biometrics   Height '6\' 1"'$  (1.854 m)    Weight 195 lb 11.2 oz (88.8 kg)    BMI (Calculated) 25.83    Single Leg Stand 30 seconds            Nutrition Therapy Plan and Nutrition Goals:   Nutrition Assessments:  MEDIFICTS Score Key:  ?  70 Need to make dietary changes   40-70 Heart Healthy Diet  ? 40 Therapeutic Level Cholesterol Diet  Flowsheet Row Cardiac Rehab from 01/30/2020 in [facility] Cardiac and Pulmonary Rehab  Picture Your Plate Total Score on Admission 80     Picture Your Plate Scores:  [id number] Unhealthy dietary pattern with much room for improvement.  41-50 Dietary pattern unlikely to meet recommendations for good health and room for improvement.  51-60 More healthful dietary pattern, with some room for improvement.   >60 Healthy dietary pattern, although there may be some specific behaviors that could be improved.    Nutrition Goals  Re-Evaluation:  Nutrition Goals Re-Evaluation    [place] Name 03/07/20 0958 04/09/20 1250           Goals   Nutrition Goal Meet with dietician Meet with dietician      Comment Cornelia Copa missed dietician appointment with his mom's death.  He has now rescheduled with her. Cornelia Copa re-scheduled RD appointment - missed today's.      Expected Outcome Meet with dietician Meet with dietician             Nutrition Goals Discharge (Final Nutrition Goals Re-Evaluation):  Nutrition Goals Re-Evaluation - 04/09/20 1250      Goals   Nutrition Goal Meet with dietician    Comment Cornelia Copa re-scheduled RD appointment - missed today's.    Expected Outcome Meet with dietician           Psychosocial: Target Goals: Acknowledge presence or absence of significant depression and/or stress, maximize coping skills, provide positive support system. Participant is able to verbalize types and ability to use techniques and skills needed for reducing stress and depression.   Education: Stress, Anxiety, and Depression - Group verbal and visual presentation to define topics covered.  Reviews how body is impacted by stress, anxiety, and depression.  Also discusses healthy ways to reduce stress and to treat/manage anxiety and depression.  Written material given at graduation.   Education: Sleep Hygiene -Provides group verbal and written instruction about how sleep can affect your health.  Define sleep hygiene, discuss sleep cycles and impact of sleep habits. Review good sleep hygiene tips.    Initial Review & Psychosocial Screening:  Initial Psych Review & Screening - 01/26/20 1411      Initial Review   Current issues with Current Sleep Concerns;Current Stress Concerns    Source of Stress Concerns Occupation    Comments working on his health to return to work.      Family Dynamics   Good Support System? Yes   wife     Barriers   Psychosocial barriers to participate in program There are no identifiable barriers  or psychosocial needs.;The patient should benefit from training in stress management and relaxation.      Screening Interventions   Interventions Encouraged to exercise;To provide support and resources with identified psychosocial needs;Provide feedback about the scores to participant    Expected Outcomes Short Term goal: Utilizing psychosocial counselor, staff and physician to assist with identification of specific Stressors or current issues interfering with healing process. Setting desired goal for each stressor or current issue identified.;Long Term Goal: Stressors or current issues are controlled or eliminated.;Short Term goal: Identification and review with participant of any Quality of Life or Depression concerns found by scoring the questionnaire.;Long Term goal: The participant improves quality of Life and PHQ9 Scores as seen by post scores and/or verbalization of changes  Quality of Life Scores:   Quality of Life - 01/30/20 1530      Quality of Life   Select Quality of Life      Quality of Life Scores   Health/Function Pre 28.8 %    Socioeconomic Pre 30 %    Psych/Spiritual Pre 29.14 %    Family Pre 30 %    GLOBAL Pre 29.29 %          Scores of 19 and below usually indicate a poorer quality of life in these areas.  A difference of  2-3 points is a clinically meaningful difference.  A difference of 2-3 points in the total score of the Quality of Life Index has been associated with significant improvement in overall quality of life, self-image, physical symptoms, and general health in studies assessing change in quality of life.  PHQ-9: Recent Review Flowsheet Data    Depression screen [facility] 2/9 01/30/2020   Decreased Interest 0   Down, Depressed, Hopeless 0   PHQ - 2 Score 0   Altered sleeping 0   Tired, decreased energy 0   Change in appetite 0   Feeling bad or failure about yourself  0   Trouble concentrating 0   Moving slowly or fidgety/restless 0   Suicidal  thoughts 0   PHQ-9 Score 0   Difficult doing work/chores Not difficult at all     Interpretation of Total Score  Total Score Depression Severity:  1-4 = Minimal depression, 5-9 = Mild depression, 10-14 = Moderate depression, 15-19 = Moderately severe depression, 20-27 = Severe depression   Psychosocial Evaluation and Intervention:  Psychosocial Evaluation - 01/26/20 1418      Psychosocial Evaluation & Interventions   Comments Karon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 He has a history of renal transplant in 2011 and states all is going well with that. He does have some issues sleeping because some of his medications have to be taken later, but he is getting used to it. He doesn't report any major stressors besides being focused on getting better. He and his wife are looking forward to the education during the program and he is ready to boost his stamina and strength in order to get back to work.    Expected Outcomes Short: attend cardiac rehab for education and exercise. Long: develop positive self care habits.           Psychosocial Re-Evaluation:  Psychosocial Re-Evaluation    [place] Name 03/07/20 0932 03/26/20 0926           Psychosocial Re-Evaluation   Current issues with Current Stress Concerns --      Comments Cornelia Copa recently/suddenly lost his mom.  They have been trying to pack things up, she lived in [place] but wanted to be buried in her home town on [place], [place] so he had some scheduling on logistics for that.  His brother who lives near by here was able to help as well. They are headed back up there again next week to finish up some of the packing.  His managing and coping the best he can.  He is sleeping well. Otherwise, he is doing pretty good. Dakai states hes sleeping well and has no signs of depression /anxiety,etc.       Expected Outcomes Short:  Finish up packing up mom's things Long: Continue to cope with death positively Short: get back to exercise to help manage stress Long: maintain positive outlook      Interventions Encouraged to attend Cardiac Rehabilitation for the exercise;Stress management education --      Continue Psychosocial Services  Follow up required by staff --             Psychosocial Discharge (Final Psychosocial Re-Evaluation):  Psychosocial Re-Evaluation - 03/26/20 0926      Psychosocial Re-Evaluation   Comments Korie states hes sleeping well and has no signs of depression /anxiety,etc.    Expected Outcomes Short: get back to exercise to help manage stress Long: maintain positive outlook           Vocational Rehabilitation: Provide vocational rehab assistance to qualifying candidates.   Vocational Rehab Evaluation & Intervention:  Vocational Rehab - 01/26/20 1411      Initial Vocational Rehab Evaluation & Intervention   Assessment shows need for Vocational Rehabilitation No           Education: Education Goals: Education classes will be provided on a variety of topics geared toward better understanding of heart health and risk factor modification. Participant will state understanding/return demonstration of topics presented as noted by education test scores.  Learning Barriers/Preferences:  Learning Barriers/Preferences - 01/26/20 1410      Learning Barriers/Preferences   Learning Barriers None    Learning Preferences None           General Cardiac Education Topics:  AED/CPR: - Group verbal and written instruction with the use of models to demonstrate the basic use of the AED with the basic ABC's of resuscitation.   Anatomy and Cardiac Procedures: - Group verbal and visual presentation and models provide information about basic cardiac anatomy and function. Reviews the testing methods done to diagnose heart disease and the outcomes of the test results.  Describes the treatment choices: Medical Management, Angioplasty, or Coronary Bypass Surgery for treating various heart conditions including Myocardial Infarction, Angina, Valve Disease, and Cardiac Arrhythmias.  Written material given at graduation. Flowsheet Row Cardiac Rehab from 03/28/2020 in [facility] Card[facility] and Pul[facility] Rehab  Education need identified 01/30/20  Date 02/08/20  Educator SB  Instruction Review Code 1- Verbalizes Understanding      Medication Safety: - Group verbal and visual instruction to review commonly prescribed medications for heart and lung disease. Reviews the medication, class of the drug, and side effects. Includes the steps to properly store meds and maintain the prescription regimen.  Written material given at graduation.   Intimacy: - Group verbal instruction through game format to discuss how heart and lung disease can affect sexual intimacy. Written material given at graduation..   Know Your Numbers and Heart Failure: - Group verbal and visual instruction to discuss disease risk factors for cardiac and pulmonary disease and treatment options.  Reviews associated critical values for Overweight/Obesity, Hypertension, Cholesterol, and Diabetes.  Discusses basics of heart failure: signs/symptoms and treatments.  Introduces Heart Failure Zone chart for action plan for heart failure.  Written material given at graduation.   Infection Prevention: - Provides verbal and written material to individual with discussion of infection control including proper hand washing and proper equipment cleaning during exercise session. Flowsheet Row Cardiac Rehab from 03/28/2020 in [facility]iac and [facility] Rehab  Date 01/30/20  Educator [facility]  Instruction Review Code 1- Verbalizes Understanding      Falls Prevention: - Provides verbal and written material to individual with  discussion of falls prevention and safety. Flowsheet Row Cardiac Rehab from 03/28/2020 in [facility] Cardiac and  Pulmonary Rehab  Date 01/30/20  Educator [facility]  Instruction Review Code 1- Verbalizes Understanding      Other: -Provides group and verbal instruction on various topics (see comments)   Knowledge Questionnaire Score:  Knowledge Questionnaire Score - 01/30/20 1531      Knowledge Questionnaire Score   Pre Score 21/26 Education Focus: MI, angina, nutrtion, exercise           Core Components/Risk Factors/Patient Goals at Admission:  Personal Goals and Risk Factors at Admission - 01/30/20 1532      Core Components/Risk Factors/Patient Goals on Admission    Weight Management Yes;Weight Loss    Intervention Weight Management: Develop a combined nutrition and exercise program designed to reach desired caloric intake, while maintaining appropriate intake of nutrient and fiber, sodium and fats, and appropriate energy expenditure required for the weight goal.;Weight Management: Provide education and appropriate resources to help participant work on and attain dietary goals.    Admit Weight 195 lb 11.2 oz (88.8 kg)    Goal Weight: Short Term 190 lb (86.2 kg)    Goal Weight: Long Term 190 lb (86.2 kg)    Expected Outcomes Short Term: Continue to assess and modify interventions until short term weight is achieved;Long Term: Adherence to nutrition and physical activity/exercise program aimed toward attainment of established weight goal;Weight Loss: Understanding of general recommendations for a balanced deficit meal plan, which promotes 1-2 lb weight loss per week and includes a negative energy balance of [phone] kcal/d;Understanding recommendations for meals to include 15-35% energy as protein, 25-35% energy from fat, 35-60% energy from carbohydrates, less than '200mg'$  of dietary cholesterol, 20-35 gm of total fiber daily;Understanding of distribution of calorie intake throughout the day with the consumption of 4-5 meals/snacks    Diabetes Yes    Intervention Provide education about signs/symptoms and  action to take for hypo/hyperglycemia.;Provide education about proper nutrition, including hydration, and aerobic/resistive exercise prescription along with prescribed medications to achieve blood glucose in normal ranges: Fasting glucose 65-99 mg/dL    Expected Outcomes Short Term: Participant verbalizes understanding of the signs/symptoms and immediate care of hyper/hypoglycemia, proper foot care and importance of medication, aerobic/resistive exercise and nutrition plan for blood glucose control.;Long Term: Attainment of HbA1C < 7%.    Heart Failure Yes    Intervention Provide a combined exercise and nutrition program that is supplemented with education, support and counseling about heart failure. Directed toward relieving symptoms such as shortness of breath, decreased exercise tolerance, and extremity edema.    Expected Outcomes Short term: Attendance in program 2-3 days a week with increased exercise capacity. Reported lower sodium intake. Reported increased fruit and vegetable intake. Reports medication compliance.;Improve functional capacity of life;Short term: Daily weights obtained and reported for increase. Utilizing diuretic protocols set by physician.;Long term: Adoption of self-care skills and reduction of barriers for early signs and symptoms recognition and intervention leading to self-care maintenance.    Hypertension Yes    Intervention Provide education on lifestyle modifcations including regular physical activity/exercise, weight management, moderate sodium restriction and increased consumption of fresh fruit, vegetables, and low fat dairy, alcohol moderation, and smoking cessation.;Monitor prescription use compliance.    Expected Outcomes Short Term: Continued assessment and intervention until BP is < 140/91m HG in hypertensive participants. < 130/844mHG in hypertensive participants with diabetes, heart failure or chronic kidney disease.;Long Term: Maintenance of blood pressure at goal  levels.  Lipids Yes    Intervention Provide education and support for participant on nutrition & aerobic/resistive exercise along with prescribed medications to achieve LDL '70mg'$ , HDL >'40mg'$ .    Expected Outcomes Short Term: Participant states understanding of desired cholesterol values and is compliant with medications prescribed. Participant is following exercise prescription and nutrition guidelines.;Long Term: Cholesterol controlled with medications as prescribed, with individualized exercise RX and with personalized nutrition plan. Value goals: LDL < '70mg'$ , HDL > 40 mg.           Education:Diabetes - Individual verbal and written instruction to review signs/symptoms of diabetes, desired ranges of glucose level fasting, after meals and with exercise. Acknowledge that pre and post exercise glucose checks will be done for 3 sessions at entry of program. [place] from 03/28/2020 in [facility] Cardiac and Pulmonary Rehab  Date 01/26/20  Educator [facility]  Instruction Review Code 1- Verbalizes Understanding      Core Components/Risk Factors/Patient Goals Review:   Goals and Risk Factor Review    [place] Name 03/07/20 [phone] 03/26/20 0923           Core Components/Risk Factors/Patient Goals Review   Personal Goals Review Weight Management/Obesity;Diabetes;Hypertension;Lipids;Heart Failure --      Review Dermaine is doing well with rehab.  He is down to 185 lb at home.  He weighs routinely.  His pressures have been good and he checks them at home.  His sugars have been doing well.  His fasting levels have been around 135 in morning.  He is hoping it will contnue to trend down for him.  He is doing well with his medications overall.  He has a cardiology follow up tomorrow and an echo.  He is hoping to get his LifeVest off!! Ramere is checking BP and BG at home.  He sees EP Dr. next week.  His EF is up to 35-40% from most recent echo.  Fasting BG was 98 yesterday.      Expected Outcomes Short:  Go to cardiology follow up and continue to work on diabetes management Long; Continue to monitor risk factors. Short: continue to monitor risk factors and see Dr as recommended Long: manage risk factors and maintain at ideal range             Core Components/Risk Factors/Patient Goals at Discharge (Final Review):   Goals and Risk Factor Review - 03/26/20 0923      Core Components/Risk Factors/Patient Goals Review   Review Natalio is checking BP and BG at home.  He sees EP Dr. next week.  His EF is up to 35-40% from most recent echo.  Fasting BG was 98 yesterday.    Expected Outcomes Short: continue to monitor risk factors and see Dr as recommended Long: manage risk factors and maintain at ideal range           ITP Comments:  ITP Comments    [place] Name 01/26/20 1404 01/30/20 1526 02/08/20 0931 02/14/20 0522 02/20/20 0740   ITP Comments Initial telephone orientation completed. Diagnosis can be found in [facility] 10/19. EP orientation scheduled for Tuesday 12/14 at 10am. Completed 6MWT and gym orientation. Initial ITP created and sent for review to Dr. Emily Filbert, Medical Director. First full day of exercise!  Patient was oriented to gym and equipment including functions, settings, policies, and procedures.  Patient's individual exercise prescription and treatment plan were reviewed.  All starting workloads were established based on the results of the 6 minute walk test done at initial orientation  visit.  The plan for exercise progression was also introduced and progression will be customized based on patient's performance and goals. 30 Day review completed. Medical Director ITP review done, changes made as directed, and signed approval by Medical Director. Pt's wife called to let us know that his mother had passed on 2020-02-26.  She lived in [place] and they will need to go up there to handle affairs.  He will be out at least the next two weeks.   [place] Name 02/29/20 1130 03/13/20 0846 03/26/20 0938 04/10/20  0608     ITP Comments Elvern has not attended sicne last review due to a death in his family. 30 Day review completed. Medical Director ITP review done, changes made as directed, and signed approval by Medical Director. Returns today after a death in his family 83 Day review completed. Medical Director ITP review done, changes made as directed, and signed approval by Medical Director.           Comments:

## 2020-04-10 NOTE — Patient Instructions (Signed)
-   skip warfarin tonight, then  - Continue on same dosage of warfarin 3 tablets every day - Recheck INR in 3 weeks  Coumadin Clinic [facility] [phone].

## 2020-04-10 NOTE — Progress Notes (Signed)
Completed initial RD evaluation 

## 2020-04-11 DIAGNOSIS — I213 ST elevation (STEMI) myocardial infarction of unspecified site: Secondary | ICD-10-CM | POA: Diagnosis not present

## 2020-04-11 DIAGNOSIS — Z955 Presence of coronary angioplasty implant and graft: Secondary | ICD-10-CM

## 2020-04-11 NOTE — Progress Notes (Signed)
Daily Session Note  Patient Details  Name: Casey [name]. MRN: [id number] Date of Birth: 1960-08-10 Referring Provider:   Flowsheet Row Cardiac Rehab from 01/30/2020 in [facility] Cardiac and Pulmonary Rehab  Referring Provider Loralie Champagne MD      Encounter Date: 04/11/2020  Check In:  Session Check In - 04/11/20 4731      Check-In   Supervising physician immediately available to respond to emergencies See telemetry face sheet for immediately available ER MD    Location [facility]-Cardiac & Pulmonary Rehab    Staff Present Birdie Sons, MPA, RN;Melissa Caiola RDN, Rowe Pavy, BA, ACSM CEP, Exercise Physiologist    Virtual Visit No    Medication changes reported     No    Fall or balance concerns reported    No    Warm-up and Cool-down Performed on first and last piece of equipment    Resistance Training Performed Yes    VAD Patient? No    PAD/SET Patient? No      Pain Assessment   Currently in Pain? No/denies              Social History   Tobacco Use  Smoking Status Never Smoker  Smokeless Tobacco Never Used    Goals Met:  Independence with exercise equipment Exercise tolerated well No report of cardiac concerns or symptoms Strength training completed today  Goals Unmet:  Not Applicable  Comments: Pt able to follow exercise prescription today without complaint.  Will continue to monitor for progression.    Dr. Emily Filbert is Medical Director for [place] and [facility].

## 2020-04-13 ENCOUNTER — Other Ambulatory Visit: Payer: Self-pay | Admitting: Cardiology

## 2020-04-13 ENCOUNTER — Other Ambulatory Visit (HOSPITAL_COMMUNITY): Payer: Self-pay | Admitting: Adult Health

## 2020-04-16 ENCOUNTER — Other Ambulatory Visit: Payer: Self-pay

## 2020-04-16 ENCOUNTER — Encounter: Payer: BC Managed Care – PPO | Attending: Cardiology | Admitting: *Deleted

## 2020-04-16 DIAGNOSIS — Z955 Presence of coronary angioplasty implant and graft: Secondary | ICD-10-CM

## 2020-04-16 DIAGNOSIS — I2102 ST elevation (STEMI) myocardial infarction involving left anterior descending coronary artery: Secondary | ICD-10-CM | POA: Diagnosis not present

## 2020-04-16 DIAGNOSIS — I213 ST elevation (STEMI) myocardial infarction of unspecified site: Secondary | ICD-10-CM

## 2020-04-16 NOTE — Progress Notes (Signed)
Daily Session Note  Patient Details  Name: Casey [name]. MRN: [id number] Date of Birth: [birth year] Referring Provider:   Flowsheet Row Cardiac Rehab from 01/30/2020 in [facility] Cardiac and Pulmonary Rehab  Referring Provider Loralie Champagne MD      Encounter Date: 04/16/2020  Check In:  Session Check In - 04/16/20 0924      Check-In   Supervising physician immediately available to respond to emergencies See telemetry face sheet for immediately available ER MD    Location [facility]-Cardiac & Pulmonary Rehab    Staff Present Heath Lark, RN, BSN, CCRP;Joseph Lou Miner, [place] Exercise Physiologist    Virtual Visit No    Medication changes reported     No    Fall or balance concerns reported    No    Warm-up and Cool-down Performed on first and last piece of equipment    Resistance Training Performed Yes    VAD Patient? No    PAD/SET Patient? No      Pain Assessment   Currently in Pain? No/denies              Social History   Tobacco Use  Smoking Status Never Smoker  Smokeless Tobacco Never Used    Goals Met:  Independence with exercise equipment Exercise tolerated well No report of cardiac concerns or symptoms  Goals Unmet:  Not Applicable  Comments: Pt able to follow exercise prescription today without complaint.  Will continue to monitor for progression.    Dr. Emily Filbert is Medical Director for [place] and [facility].

## 2020-04-18 ENCOUNTER — Other Ambulatory Visit: Payer: Self-pay

## 2020-04-18 DIAGNOSIS — I2102 ST elevation (STEMI) myocardial infarction involving left anterior descending coronary artery: Secondary | ICD-10-CM | POA: Diagnosis not present

## 2020-04-18 DIAGNOSIS — Z955 Presence of coronary angioplasty implant and graft: Secondary | ICD-10-CM

## 2020-04-18 DIAGNOSIS — I213 ST elevation (STEMI) myocardial infarction of unspecified site: Secondary | ICD-10-CM

## 2020-04-18 NOTE — Progress Notes (Signed)
Daily Session Note  Patient Details  Name: Casey [name]. MRN: [id number] Date of Birth: [birth year] Referring Provider:   Flowsheet Row Cardiac Rehab from 01/30/2020 in [facility] Cardiac and Pulmonary Rehab  Referring Provider Loralie Champagne MD      Encounter Date: 04/18/2020  Check In:  Session Check In - 04/18/20 0948      Check-In   Supervising physician immediately available to respond to emergencies See telemetry face sheet for immediately available ER MD    Location [facility]-Cardiac & Pulmonary Rehab    Staff Present Birdie Sons, MPA, RN;Melissa Caiola RDN, LDN;Jessica Luan Pulling, MA, RCEP, CCRP, CCET    Virtual Visit No    Medication changes reported     No    Fall or balance concerns reported    No    Warm-up and Cool-down Performed on first and last piece of equipment    Resistance Training Performed Yes    VAD Patient? No    PAD/SET Patient? No      Pain Assessment   Currently in Pain? No/denies              Social History   Tobacco Use  Smoking Status Never Smoker  Smokeless Tobacco Never Used    Goals Met:  Independence with exercise equipment Exercise tolerated well No report of cardiac concerns or symptoms Strength training completed today  Goals Unmet:  Not Applicable  Comments: Pt able to follow exercise prescription today without complaint.  Will continue to monitor for progression.    Dr. Emily Filbert is Medical Director for [place] and [facility].

## 2020-04-23 ENCOUNTER — Other Ambulatory Visit: Payer: Self-pay

## 2020-04-23 DIAGNOSIS — I213 ST elevation (STEMI) myocardial infarction of unspecified site: Secondary | ICD-10-CM

## 2020-04-23 DIAGNOSIS — I2102 ST elevation (STEMI) myocardial infarction involving left anterior descending coronary artery: Secondary | ICD-10-CM | POA: Diagnosis not present

## 2020-04-23 DIAGNOSIS — Z955 Presence of coronary angioplasty implant and graft: Secondary | ICD-10-CM

## 2020-04-23 NOTE — Progress Notes (Signed)
Daily Session Note  Patient Details  Name: Casey [name]. MRN: [id number] Date of Birth: Jan 28, 1961 Referring Provider:   Flowsheet Row Cardiac Rehab from 01/30/2020 in [facility] Cardiac and Pulmonary Rehab  Referring Provider Loralie Champagne MD      Encounter Date: 04/23/2020  Check In:  Session Check In - 04/23/20 0915      Check-In   Supervising physician immediately available to respond to emergencies See telemetry face sheet for immediately available ER MD    Location [facility]-Cardiac & Pulmonary Rehab    Staff Present Birdie Sons, MPA, RN;Amanda Oletta Darter, BA, ACSM CEP, Exercise Physiologist;Kara Eliezer Bottom, MS Exercise Physiologist    Virtual Visit No    Medication changes reported     No    Fall or balance concerns reported    No    Warm-up and Cool-down Performed on first and last piece of equipment    Resistance Training Performed Yes    VAD Patient? No    PAD/SET Patient? No      Pain Assessment   Currently in Pain? No/denies              Social History   Tobacco Use  Smoking Status Never Smoker  Smokeless Tobacco Never Used    Goals Met:  Independence with exercise equipment Exercise tolerated well No report of cardiac concerns or symptoms Strength training completed today  Goals Unmet:  Not Applicable  Comments: Pt able to follow exercise prescription today without complaint.  Will continue to monitor for progression.    Dr. Emily Filbert is Medical Director for [place] and [facility].

## 2020-04-30 ENCOUNTER — Other Ambulatory Visit: Payer: Self-pay

## 2020-04-30 DIAGNOSIS — I2102 ST elevation (STEMI) myocardial infarction involving left anterior descending coronary artery: Secondary | ICD-10-CM | POA: Diagnosis not present

## 2020-04-30 DIAGNOSIS — I213 ST elevation (STEMI) myocardial infarction of unspecified site: Secondary | ICD-10-CM

## 2020-04-30 DIAGNOSIS — Z955 Presence of coronary angioplasty implant and graft: Secondary | ICD-10-CM

## 2020-04-30 NOTE — Progress Notes (Signed)
Daily Session Note  Patient Details  Name: Casey [name]. MRN: [id number] Date of Birth: Oct 05, 1960 Referring Provider:   Flowsheet Row Cardiac Rehab from 01/30/2020 in [facility] Cardiac and Pulmonary Rehab  Referring Provider Loralie Champagne MD      Encounter Date: 04/30/2020  Check In:  Session Check In - 04/30/20 0924      Check-In   Supervising physician immediately available to respond to emergencies See telemetry face sheet for immediately available ER MD    Location [facility]-Cardiac & Pulmonary Rehab    Staff Present Birdie Sons, MPA, RN;Amanda Oletta Darter, BA, ACSM CEP, Exercise Physiologist;Kara Eliezer Bottom, MS Exercise Physiologist    Virtual Visit No    Medication changes reported     No    Fall or balance concerns reported    No    Warm-up and Cool-down Performed on first and last piece of equipment    Resistance Training Performed Yes    VAD Patient? No    PAD/SET Patient? No      Pain Assessment   Currently in Pain? No/denies              Social History   Tobacco Use  Smoking Status Never Smoker  Smokeless Tobacco Never Used    Goals Met:  Independence with exercise equipment Exercise tolerated well No report of cardiac concerns or symptoms Strength training completed today  Goals Unmet:  Not Applicable  Comments: Pt able to follow exercise prescription today without complaint.  Will continue to monitor for progression.    Dr. Emily Filbert is Medical Director for [place] and [facility].

## 2020-05-01 ENCOUNTER — Ambulatory Visit (INDEPENDENT_AMBULATORY_CARE_PROVIDER_SITE_OTHER): Payer: BC Managed Care – PPO

## 2020-05-01 ENCOUNTER — Other Ambulatory Visit: Payer: Self-pay

## 2020-05-01 ENCOUNTER — Telehealth (HOSPITAL_COMMUNITY): Payer: Self-pay | Admitting: Cardiology

## 2020-05-01 DIAGNOSIS — Z7901 Long term (current) use of anticoagulants: Secondary | ICD-10-CM | POA: Diagnosis not present

## 2020-05-01 DIAGNOSIS — I4891 Unspecified atrial fibrillation: Secondary | ICD-10-CM

## 2020-05-01 DIAGNOSIS — I2102 ST elevation (STEMI) myocardial infarction involving left anterior descending coronary artery: Secondary | ICD-10-CM

## 2020-05-01 DIAGNOSIS — I236 Thrombosis of atrium, auricular appendage, and ventricle as current complications following acute myocardial infarction: Secondary | ICD-10-CM

## 2020-05-01 LAB — POCT INR: INR: 4 — AB (ref 2.0–3.0)

## 2020-05-01 NOTE — Patient Instructions (Signed)
-   skip warfarin tonight, then  - START NEW dosage of warfarin 3 tablets every day EXCEPT 2 TABLETS ON MONDAYS & WEDNESDAYS. - Recheck INR in 3 weeks  Coumadin Clinic [facility] [phone].

## 2020-05-01 NOTE — Telephone Encounter (Signed)
[facility]  Called to request fax number for dental procedure/extraction clearance  Fax number provided

## 2020-05-02 DIAGNOSIS — I2102 ST elevation (STEMI) myocardial infarction involving left anterior descending coronary artery: Secondary | ICD-10-CM | POA: Diagnosis not present

## 2020-05-02 DIAGNOSIS — I213 ST elevation (STEMI) myocardial infarction of unspecified site: Secondary | ICD-10-CM

## 2020-05-02 DIAGNOSIS — Z955 Presence of coronary angioplasty implant and graft: Secondary | ICD-10-CM

## 2020-05-02 NOTE — Progress Notes (Signed)
Daily Session Note  Patient Details  Name: Casey [name]. MRN: [id number] Date of Birth: [birth year] Referring Provider:   Flowsheet Row Cardiac Rehab from 01/30/2020 in [facility] Cardiac and Pulmonary Rehab  Referring Provider Loralie Champagne MD      Encounter Date: 05/02/2020  Check In:  Session Check In - 05/02/20 0938      Check-In   Supervising physician immediately available to respond to emergencies See telemetry face sheet for immediately available ER MD    Location [facility]-Cardiac & Pulmonary Rehab    Staff Present Birdie Sons, MPA, RN;Melissa Caiola RDN, Rowe Pavy, BA, ACSM CEP, Exercise Physiologist    Virtual Visit No    Medication changes reported     No    Fall or balance concerns reported    No    Warm-up and Cool-down Performed on first and last piece of equipment    Resistance Training Performed Yes    VAD Patient? No    PAD/SET Patient? No      Pain Assessment   Currently in Pain? No/denies              Social History   Tobacco Use  Smoking Status Never Smoker  Smokeless Tobacco Never Used    Goals Met:  Independence with exercise equipment Exercise tolerated well No report of cardiac concerns or symptoms Strength training completed today  Goals Unmet:  Not Applicable  Comments: Pt able to follow exercise prescription today without complaint.  Will continue to monitor for progression.    Dr. Emily Filbert is Medical Director for [place] and [facility].

## 2020-05-08 ENCOUNTER — Encounter: Payer: Self-pay | Admitting: *Deleted

## 2020-05-08 NOTE — Progress Notes (Signed)
Cardiac Individual Treatment Plan  Patient Details  Name: Casey [name]. MRN: [id number] Date of Birth: 1960/06/21 Referring Provider:   Flowsheet Row Cardiac Rehab from 01/30/2020 in [facility] Cardiac and Pulmonary Rehab  Referring Provider Loralie Champagne MD      Initial Encounter Date:  Flowsheet Row Cardiac Rehab from 01/30/2020 in [facility] Cardiac and Pulmonary Rehab  Date 01/30/20      Visit Diagnosis: No diagnosis found.  Patient's Home Medications on Admission:  Current Outpatient Medications:  .  amiodarone (PACERONE) 200 MG tablet, Take 1 tablet (200 mg total) by mouth daily., Disp: 30 tablet, Rfl: 6 .  carvedilol (COREG) 12.5 MG tablet, Take 1.5 tablets (18.75 mg total) by mouth 2 (two) times daily., Disp: 270 tablet, Rfl: 3 .  clopidogrel (PLAVIX) 75 MG tablet, Take 1 tablet (75 mg total) by mouth daily., Disp: 30 tablet, Rfl: 6 .  dapagliflozin propanediol (FARXIGA) 10 MG TABS tablet, Take 1 tablet (10 mg total) by mouth daily before breakfast., Disp: 90 tablet, Rfl: 3 .  glipiZIDE (GLUCOTROL) 10 MG tablet, Take 10 mg by mouth 2 (two) times daily before a meal., Disp: , Rfl:  .  isosorbide-hydrALAZINE (BIDIL) 20-37.5 MG tablet, Take 2 tablets by mouth 3 (three) times daily., Disp: 180 tablet, Rfl: 6 .  loratadine (CLARITIN) 10 MG tablet, Take 10 mg by mouth daily as needed for allergies., Disp: , Rfl:  .  nitroGLYCERIN (NITROSTAT) 0.4 MG SL tablet, Place 1 tablet (0.4 mg total) under the tongue every 5 (five) minutes as needed for chest pain., Disp: 100 tablet, Rfl: 3 .  pantoprazole (PROTONIX) 40 MG tablet, TAKE 1 TABLET BY MOUTH EVERY DAY, Disp: 90 tablet, Rfl: 1 .  predniSONE (DELTASONE) 5 MG tablet, Take 1 tablet (5 mg total) by mouth daily with breakfast., Disp: , Rfl:  .  rosuvastatin (CRESTOR) 40 MG tablet, Take 1 tablet (40 mg total) by mouth daily., Disp: 30 tablet, Rfl: 6 .  simethicone (MYLICON) 468 MG chewable tablet, Chew 125 mg by mouth every 6 (six) hours as  needed for flatulence., Disp: , Rfl:  .  tacrolimus (PROGRAF) 1 MG capsule, Take 4 mg by mouth 2 (two) times daily., Disp: , Rfl:  .  torsemide (DEMADEX) 20 MG tablet, Take 1 tablet (20 mg total) by mouth as needed. 3 pound weight gain in 24 hours, Disp: 30 tablet, Rfl: 6 .  warfarin (COUMADIN) 1 MG tablet, TAKE 3 TO 4 TABLETS DAILY AS DIRECTED BY ANTICOAGULATION CLINIC, Disp: 110 tablet, Rfl: 1  Past Medical History: Past Medical History:  Diagnosis Date  . CHF (congestive heart failure) ([place])   . Chronic kidney disease 04/2009   Kidney Transplant  . Diabetes mellitus   . GERD (gastroesophageal reflux disease)    as needed reflux  . Heart attack ([place]) 02/02/2020  . Hypertension   . 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    Tobacco Use: Social History   Tobacco Use  Smoking Status Never Smoker  Smokeless Tobacco Never Used    Labs: Recent Review Flowsheet Data    Labs for ITP Cardiac and Pulmonary Rehab Latest Ref Rng & Units 12/12/2019 12/13/2019 12/14/2019 12/15/2019 12/26/2019   Cholestrol 0 - 200 mg/dL - - - - 147   LDLCALC 0 - 99 mg/dL - - - - 68   HDL >40 mg/dL - - - - 43   Trlycerides <150 mg/dL - - - - 181(H)   Hemoglobin A1c  4.8 - 5.6 % - - - - -   PHART 7.350 - 7.450 - - - - -   PCO2ART 32.0 - 48.0 mmHg - - - - -   HCO3 20.0 - 28.0 mmol/L - - - - -   TCO2 22 - 32 mmol/L - - - - -   ACIDBASEDEF 0.0 - 2.0 mmol/L - - - - -   O2SAT % 65.2 70.7 68.9 72.2 -       Exercise Target Goals: Exercise Program Goal: Individual exercise prescription set using results from initial 6 min walk test and THRR while considering  patient's activity barriers and safety.   Exercise Prescription Goal: Initial exercise prescription builds to 30-45 minutes a day of aerobic activity, 2-3 days per week.  Home exercise guidelines will be given to patient during program as part of exercise prescription that the participant will acknowledge.   Education:  Aerobic Exercise: - Group verbal and visual presentation on the components of exercise prescription. Introduces F.I.T.T principle from ACSM for exercise prescriptions.  Reviews F.I.T.T. principles of aerobic exercise including progression. Written material given at graduation. Flowsheet Row Cardiac Rehab from 05/02/2020 in [facility] Cardiac and Pulmonary Rehab  Education need identified 01/30/20      Education: Resistance Exercise: - Group verbal and visual presentation on the components of exercise prescription. Introduces F.I.T.T principle from ACSM for exercise prescriptions  Reviews F.I.T.T. principles of resistance exercise including progression. Written material given at graduation. Flowsheet Row Cardiac Rehab from 05/02/2020 in [facility] Cardiac and Pulmonary Rehab  Date 02/08/20  Educator [facility]  Instruction Review Code 1- [employer] Understanding       Education: Exercise & Equipment Safety: - Individual verbal instruction and demonstration of equipment use and safety with use of the equipment. Flowsheet Row Cardiac Rehab from 05/02/2020 in [facility] Cardiac and Pulmonary Rehab  Date 01/30/20  Educator [facility]  Instruction Review Code 1- Verbalizes Understanding      Education: Exercise Physiology & General Exercise Guidelines: - Group verbal and written instruction with models to review the exercise physiology of the cardiovascular system and associated critical values. Provides general exercise guidelines with specific guidelines to those with heart or lung disease.  Flowsheet Row Cardiac Rehab from 05/02/2020 in [facility] Cardiac and Pulmonary Rehab  Date 03/28/20  Educator [facility]  Instruction Review Code 1- Verbalizes Understanding      Education: Flexibility, Balance, Mind/Body Relaxation: - Group verbal and visual presentation with interactive activity on the components of exercise prescription. Introduces F.I.T.T principle from ACSM for exercise prescriptions. Reviews F.I.T.T. principles of  flexibility and balance exercise training including progression. Also discusses the mind body connection.  Reviews various relaxation techniques to help reduce and manage stress (i.e. Deep breathing, progressive muscle relaxation, and visualization). Balance handout provided to take home. Written material given at graduation. Flowsheet Row Cardiac Rehab from 05/02/2020 in [facility] Cardiac and Pulmonary Rehab  Date 04/18/20  Educator [facility]  Instruction Review Code 1- Verbalizes Understanding      Activity Barriers & Risk Stratification:  Activity Barriers & Cardiac Risk Stratification - 01/30/20 1527      Activity Barriers & Cardiac Risk Stratification   Activity Barriers Other (comment);Deconditioning    Comments Life Vest; Renal Transplant, L shoulder tingly    Cardiac Risk Stratification High           6 Minute Walk:  6 Minute Walk    Row Name 01/30/20 1526         6 Minute Walk  Phase Initial     Distance 1230 feet     Walk Time 6 minutes     # of Rest Breaks 0     MPH 2.33     METS 3.71     RPE 9     VO2 Peak 12.97     Symptoms No     Resting HR 75 bpm     Resting BP 128/70     Resting Oxygen Saturation  97 %     Exercise Oxygen Saturation  during 6 min walk 96 %     Max Ex. HR 101 bpm     Max Ex. BP 146/64     2 Minute Post BP 126/64            Oxygen Initial Assessment:   Oxygen Re-Evaluation:   Oxygen Discharge (Final Oxygen Re-Evaluation):   Initial Exercise Prescription:  Initial Exercise Prescription - 01/30/20 1500      Date of Initial Exercise RX and Referring Provider   Date 01/30/20    Referring Provider Loralie Champagne MD      Treadmill   MPH 2.3    Grade 1    Minutes 15    METs 3.08      NuStep   Level 4    SPM 80    Minutes 15    METs 3      Elliptical   Level 1    Speed 3.5    Minutes 15    METs 3      Biostep-RELP   Level 4    SPM 50    Minutes 15    METs 3      Prescription Details   Frequency (times per week) 2     Duration Progress to 30 minutes of continuous aerobic without signs/symptoms of physical distress      Intensity   THRR 40-80% of Max Heartrate 109-144    Ratings of Perceived Exertion 11-13    Perceived Dyspnea 0-4      Progression   Progression Continue to progress workloads to maintain intensity without signs/symptoms of physical distress.      Resistance Training   Training Prescription Yes    Weight 4 lb    Reps 10-15           Perform Capillary Blood Glucose checks as needed.  Exercise Prescription Changes:  Exercise Prescription Changes    Row Name 01/30/20 1500 02/13/20 1200 03/07/20 0900 03/13/20 1400 03/27/20 1500     Response to Exercise   Blood Pressure (Admit) 128/70 142/76 -- 108/62 110/62   Blood Pressure (Exercise) 146/64 152/64 -- 114/66 118/62   Blood Pressure (Exit) 126/64 118/60 -- 110/60 112/64   Heart Rate (Admit) 75 bpm 73 bpm -- 69 bpm 83 bpm   Heart Rate (Exercise) 101 bpm 92 bpm -- 90 bpm 98 bpm   Heart Rate (Exit) 76 bpm 76 bpm -- 75 bpm 71 bpm   Oxygen Saturation (Admit) 97 % -- -- -- --   Oxygen Saturation (Exercise) 96 % -- -- -- --   Rating of Perceived Exertion (Exercise) 9 12 -- 12 11   Symptoms none none -- none none   Comments walk test results -- -- -- --   Duration -- -- -- Continue with 30 min of aerobic exercise without signs/symptoms of physical distress. Continue with 30 min of aerobic exercise without signs/symptoms of physical distress.   Intensity -- -- -- THRR unchanged THRR unchanged  Progression   Progression -- -- -- Continue to progress workloads to maintain intensity without signs/symptoms of physical distress. Continue to progress workloads to maintain intensity without signs/symptoms of physical distress.   Average METs -- -- -- 2.7 3.55     Resistance Training   Training Prescription -- Yes -- Yes Yes   Weight -- 4 lb -- 4 lb 4 lb   Reps -- 10-15 -- 10-15 10-15     Interval Training   Interval Training -- -- --  No No     Treadmill   MPH -- 2.3 -- 2.4 --   Grade -- 1 -- 0.5 --   Minutes -- 15 -- 15 --   METs -- 3.08 -- 3 --     NuStep   Level -- 4 -- -- 4   SPM -- 80 -- -- 80   Minutes -- 15 -- -- 15   METs -- 2.1 -- -- 4     T5 Nustep   Level -- -- -- 4 --   Minutes -- -- -- 15 --   METs -- -- -- 2.4 --     Biostep-RELP   Level -- -- -- -- 4   SPM -- -- -- -- 50   Minutes -- -- -- -- 15   METs -- -- -- -- 4     Home Exercise Plan   Plans to continue exercise at -- -- Home (comment)  walking, weights, staff videos Home (comment)  walking, weights, staff videos Home (comment)  walking, weights, staff videos   Frequency -- -- Add 2 additional days to program exercise sessions. Add 2 additional days to program exercise sessions. Add 2 additional days to program exercise sessions.   Initial Home Exercises Provided -- -- 03/07/20 03/07/20 03/07/20   Row Name 04/10/20 0700 04/23/20 0800 05/06/20 1500         Response to Exercise   Blood Pressure (Admit) 108/60 114/54 102/64     Blood Pressure (Exercise) 112/76 120/60 122/60     Blood Pressure (Exit) 108/52 124/64 104/54     Heart Rate (Admit) 64 bpm 82 bpm 73 bpm     Heart Rate (Exercise) 94 bpm 88 bpm 83 bpm     Heart Rate (Exit) 71 bpm 65 bpm 66 bpm     Rating of Perceived Exertion (Exercise) _0 Symptoms none none none     Duration Continue with 30 min of aerobic exercise without signs/symptoms of physical distress. Continue with 30 min of aerobic exercise without signs/symptoms of physical distress. Continue with 30 min of aerobic exercise without signs/symptoms of physical distress.     Intensity THRR unchanged THRR unchanged THRR unchanged           Progression   Progression Continue to progress workloads to maintain intensity without signs/symptoms of physical distress. Continue to progress workloads to maintain intensity without signs/symptoms of physical distress. Continue to progress workloads to maintain intensity  without signs/symptoms of physical distress.     Average METs 2.85 2.95 2.83           Resistance Training   Training Prescription Yes Yes Yes     Weight 4 lb 5 lb 5 lb     Reps 10-15 10-15 10-15           Interval Training   Interval Training No -- No           Treadmill   MPH 3 --  2.3     Grade 0.5 -- 1     Minutes 15 -- 15     METs 3.5 -- 3.08           NuStep   Level 4 -- --     Minutes 15 -- --     METs 2.6 -- --           Elliptical   Level 1 -- 2     Speed 3.5 -- 3.5     Minutes 15 -- 15     METs 2.2 -- 2.4           REL-XR   Level -- -- 2     Minutes -- -- 15     METs -- -- 3           Biostep-RELP   Level 4 -- --     Minutes 15 -- --     METs 3 -- --           Home Exercise Plan   Plans to continue exercise at Home (comment)  walking, weights, staff videos -- Home (comment)  walking, weights, staff videos     Frequency Add 2 additional days to program exercise sessions. -- Add 2 additional days to program exercise sessions.     Initial Home Exercises Provided 03/07/20 -- 03/07/20            Exercise Comments:  Exercise Comments    Row Name 02/08/20 0932 03/26/20 8088         Exercise Comments First full day of exercise!  Patient was oriented to gym and equipment including functions, settings, policies, and procedures.  Patient's individual exercise prescription and treatment plan were reviewed.  All starting workloads were established based on the results of the 6 minute walk test done at initial orientation visit.  The plan for exercise progression was also introduced and progression will be customized based on patient's performance and goals. Returns today after a death in his family             Exercise Goals and Review:  Exercise Goals    Row Name 01/30/20 1529             Exercise Goals   Increase Physical Activity Yes       Intervention Provide advice, education, support and counseling about physical activity/exercise  needs.;Develop an individualized exercise prescription for aerobic and resistive training based on initial evaluation findings, risk stratification, comorbidities and participant's personal goals.       Expected Outcomes Short Term: Attend rehab on a regular basis to increase amount of physical activity.;Long Term: Add in home exercise to make exercise part of routine and to increase amount of physical activity.;Long Term: Exercising regularly at least 3-5 days a week.       Increase Strength and Stamina Yes       Intervention Provide advice, education, support and counseling about physical activity/exercise needs.;Develop an individualized exercise prescription for aerobic and resistive training based on initial evaluation findings, risk stratification, comorbidities and participant's personal goals.       Expected Outcomes Short Term: Increase workloads from initial exercise prescription for resistance, speed, and METs.;Short Term: Perform resistance training exercises routinely during rehab and add in resistance training at home;Long Term: Improve cardiorespiratory fitness, muscular endurance and strength as measured by increased METs and functional capacity (6MWT)       Able to understand and use rate of perceived exertion (RPE) scale Yes  Intervention Provide education and explanation on how to use RPE scale       Expected Outcomes Long Term:  Able to use RPE to guide intensity level when exercising independently;Short Term: Able to use RPE daily in rehab to express subjective intensity level       Able to understand and use Dyspnea scale Yes       Intervention Provide education and explanation on how to use Dyspnea scale       Expected Outcomes Long Term: Able to use Dyspnea scale to guide intensity level when exercising independently;Short Term: Able to use Dyspnea scale daily in rehab to express subjective sense of shortness of breath during exertion       Knowledge and understanding of  Target Heart Rate Range (THRR) Yes       Intervention Provide education and explanation of THRR including how the numbers were predicted and where they are located for reference       Expected Outcomes Short Term: Able to state/look up THRR;Short Term: Able to use daily as guideline for intensity in rehab;Long Term: Able to use THRR to govern intensity when exercising independently       Able to check pulse independently Yes       Intervention Provide education and demonstration on how to check pulse in carotid and radial arteries.;Review the importance of being able to check your own pulse for safety during independent exercise       Expected Outcomes Short Term: Able to explain why pulse checking is important during independent exercise;Long Term: Able to check pulse independently and accurately       Understanding of Exercise Prescription Yes       Intervention Provide education, explanation, and written materials on patient's individual exercise prescription       Expected Outcomes Short Term: Able to explain program exercise prescription;Long Term: Able to explain home exercise prescription to exercise independently              Exercise Goals Re-Evaluation :  Exercise Goals Re-Evaluation    Row Name 02/08/20 0932 03/07/20 0931 03/13/20 1407 03/26/20 0928 04/10/20 0738     Exercise Goal Re-Evaluation   Exercise Goals Review Increase Physical Activity;Able to understand and use rate of perceived exertion (RPE) scale;Knowledge and understanding of Target Heart Rate Range (THRR);Understanding of Exercise Prescription;Increase Strength and Stamina;Able to understand and use Dyspnea scale;Able to check pulse independently Increase Physical Activity;Increase Strength and Stamina;Understanding of Exercise Prescription Increase Physical Activity;Increase Strength and Stamina;Understanding of Exercise Prescription Increase Physical Activity;Increase Strength and Stamina Increase Physical  Activity;Increase Strength and Stamina;Understanding of Exercise Prescription   Comments Reviewed RPE and dyspnea scales, THR and program prescription with pt today.  Pt voiced understanding and was given a copy of goals to take home. Neilan returned today after being in [place] and finallizing things after his mother's passing.  He was able to exercise while he was up there.  He was moving furniture and walking 4-5 blocks each day.  He does feel like his strength and stamina are starting to reocover some.  Reviewed home exercise with pt today.  Pt plans to walking and weights at home for exercise.  We also talked about using the staff videos for bad weather days. Reviewed THR, pulse, RPE, sign and symptoms, pulse oximetery and when to call 911 or MD.  Also discussed weather considerations and indoor options.  Pt voiced understanding. Only one visit this month after his mother's passing.  He is  back in [place] this week again.  He is planning to try to exercise more up there this time. Yulian did do some push ups and squats while he was away.  He is gald to be back to [facility]! Velmer is doing well back in rehab.  He is coming regularly, he even tried out the elliptical and doing well on it.  He is up to 2.2 METs on it.  We will continue to monitor his progress.   Expected Outcomes Short: Use RPE daily to regulate intensity. Long: Follow program prescription in THR. Short: Start to add in exercise at home Long: Continue to exercise independently Short: Return to regular attendance  Long: Continue to improve stamina Short:  get back to regular attendance Long:  improve overall stamina Short: Continue to improve on elliptical and get into home routine Long: Continue to improve stamina   Row Name 04/16/20 0932 04/23/20 0853 05/06/20 1554         Exercise Goal Re-Evaluation   Exercise Goals Review Increase Physical Activity;Increase Strength and Stamina Increase Physical Activity;Increase Strength and Stamina  Increase Physical Activity;Increase Strength and Stamina;Understanding of Exercise Prescription     Comments Konrad Penta does some strength exercises and walking outside class.  We reviewed monitoring RPE, HR during exercise. Wilkes is progressing well and uses the elliptical some sessions.  He has improved MET level.  He has increased to 5 lb for strength training. Nishawn is doing well in rehab.  He is up to 2.4 METs on the treadmill.  He has only been doing 2.3 mph on the treadmill, we will talk about moving back up to 3.  We will continue to monitor his progress.     Expected Outcomes Short: check HR during exercise Long: increase  MET level Short: continue to exercise consistently Long:  improve overall stamina Short: Increase treadmill back to 3 mph Long; continue to improve stamina            Discharge Exercise Prescription (Final Exercise Prescription Changes):  Exercise Prescription Changes - 05/06/20 1500      Response to Exercise   Blood Pressure (Admit) 102/64    Blood Pressure (Exercise) 122/60    Blood Pressure (Exit) 104/54    Heart Rate (Admit) 73 bpm    Heart Rate (Exercise) 83 bpm    Heart Rate (Exit) 66 bpm    Rating of Perceived Exertion (Exercise) 12    Symptoms none    Duration Continue with 30 min of aerobic exercise without signs/symptoms of physical distress.    Intensity THRR unchanged      Progression   Progression Continue to progress workloads to maintain intensity without signs/symptoms of physical distress.    Average METs 2.83      Resistance Training   Training Prescription Yes    Weight 5 lb    Reps 10-15      Interval Training   Interval Training No      Treadmill   MPH 2.3    Grade 1    Minutes 15    METs 3.08      Elliptical   Level 2    Speed 3.5    Minutes 15    METs 2.4      REL-XR   Level 2    Minutes 15    METs 3      Home Exercise Plan   Plans to continue exercise at Home (comment)   walking, weights, staff videos   Frequency  Add  2 additional days to program exercise sessions.    Initial Home Exercises Provided 03/07/20           Nutrition:  Target Goals: Understanding of nutrition guidelines, daily intake of sodium <1522m, cholesterol <2071m calories 30% from fat and 7% or less from saturated fats, daily to have 5 or more servings of fruits and vegetables.  Education: All About Nutrition: -Group instruction provided by verbal, written material, interactive activities, discussions, models, and posters to present general guidelines for heart healthy nutrition including fat, fiber, MyPlate, the role of sodium in heart healthy nutrition, utilization of the nutrition label, and utilization of this knowledge for meal planning. Follow up email sent as well. Written material given at graduation.   Biometrics:  Pre Biometrics - 01/30/20 1530      Pre Biometrics   Height 6' 1" (1.854 m)    Weight 195 lb 11.2 oz (88.8 kg)    BMI (Calculated) 25.83    Single Leg Stand 30 seconds            Nutrition Therapy Plan and Nutrition Goals:  Nutrition Therapy & Goals - 04/10/20 1502      Nutrition Therapy   Diet Heart healthy, low Na, diabetes friendly    Drug/Food Interactions Statins/Certain Fruits;Coumadin/Vit K    Protein (specify units) 70g    Fiber 30 grams    Whole Grain Foods 3 servings    Saturated Fats 12 max. grams    Fruits and Vegetables 8 servings/day    Sodium 1.5 grams      Personal Nutrition Goals   Nutrition Goal ST: add 1 serving of complex carbohydrates to meals, he would like to include sweet potatoes LT: include all macronutrients in meals    Comments green beans, boiled chicken, salmon, baked pork chop, spinach, broccoli, boiled eggs, salads, berries, oranges, vegetables. BG in am 120-125, A1C 7.2. He has limited carbohydrates, recommended including some complex carbohydrates at least 1 exchange per meal. Pt is open to making changes. He reports speaking to a dietitian in the last two  months and feeling like he has no questions at this time. Reviewed heart healthy and diabetes friendly eating.      Intervention Plan   Intervention Prescribe, educate and counsel regarding individualized specific dietary modifications aiming towards targeted core components such as weight, hypertension, lipid management, diabetes, heart failure and other comorbidities.;Nutrition handout(s) given to patient.    Expected Outcomes Short Term Goal: Understand basic principles of dietary content, such as calories, fat, sodium, cholesterol and nutrients.;Short Term Goal: A plan has been developed with personal nutrition goals set during dietitian appointment.;Long Term Goal: Adherence to prescribed nutrition plan.           Nutrition Assessments:  MEDIFICTS Score Key:  ?70 Need to make dietary changes   40-70 Heart Healthy Diet  ? 40 Therapeutic Level Cholesterol Diet  [facility]  Picture Your Plate Total Score on Admission 80     Picture Your Plate Scores:  <4<29nhealthy dietary pattern with much room for improvement.  41-50 Dietary pattern unlikely to meet recommendations for good health and room for improvement.  51-60 More healthful dietary pattern, with some room for improvement.   >60 Healthy dietary pattern, although there may be some specific behaviors that could be improved.    Nutrition Goals Re-Evaluation:  Nutrition Goals Re-Evaluation    [place] 03/07/20 [id number]/22 1250 04/16/20 [id number]  Goals   Nutrition Goal Meet with dietician Meet with dietician ST: add 1 serving of complex carbohydrates to meals, he would like to include sweet potatoes LT: include all macronutrients in meals     Comment Theseus missed dietician appointment with his mom's death.  He has now rescheduled with her. Cornelia Copa re-scheduled RD appointment - missed today's. He had met with me regarding nutrition last week and is  still working on making changes, he has added some sweet potatoes. Will continue to check in.     Expected Outcome Meet with dietician Meet with dietician ST: add 1 serving of complex carbohydrates to meals, he would like to include sweet potatoes LT: include all macronutrients in meals            Nutrition Goals Discharge (Final Nutrition Goals Re-Evaluation):  Nutrition Goals Re-Evaluation - 04/16/20 0944      Goals   Nutrition Goal ST: add 1 serving of complex carbohydrates to meals, he would like to include sweet potatoes LT: include all macronutrients in meals    Comment He had met with me regarding nutrition last week and is still working on making changes, he has added some sweet potatoes. Will continue to check in.    Expected Outcome ST: add 1 serving of complex carbohydrates to meals, he would like to include sweet potatoes LT: include all macronutrients in meals           Psychosocial: Target Goals: Acknowledge presence or absence of significant depression and/or stress, maximize coping skills, provide positive support system. Participant is able to verbalize types and ability to use techniques and skills needed for reducing stress and depression.   Education: Stress, Anxiety, and Depression - Group verbal and visual presentation to define topics covered.  Reviews how body is impacted by stress, anxiety, and depression.  Also discusses healthy ways to reduce stress and to treat/manage anxiety and depression.  Written material given at graduation.   Education: Sleep Hygiene -Provides group verbal and written instruction about how sleep can affect your health.  Define sleep hygiene, discuss sleep cycles and impact of sleep habits. Review good sleep hygiene tips.    Initial Review & Psychosocial Screening:  Initial Psych Review & Screening - 01/26/20 1411      Initial Review   Current issues with Current Sleep Concerns;Current Stress Concerns    Source of Stress Concerns  Occupation    Comments working on his health to return to work.      Family Dynamics   Good Support System? Yes   wife     Barriers   Psychosocial barriers to participate in program There are no identifiable barriers or psychosocial needs.;The patient should benefit from training in stress management and relaxation.      Screening Interventions   Interventions Encouraged to exercise;To provide support and resources with identified psychosocial needs;Provide feedback about the scores to participant    Expected Outcomes Short Term goal: Utilizing psychosocial counselor, staff and physician to assist with identification of specific Stressors or current issues interfering with healing process. Setting desired goal for each stressor or current issue identified.;Long Term Goal: Stressors or current issues are controlled or eliminated.;Short Term goal: Identification and review with participant of any Quality of Life or Depression concerns found by scoring the questionnaire.;Long Term goal: The participant improves quality of Life and PHQ9 Scores as seen by post scores and/or verbalization of changes           Quality of Life Scores:  Quality of Life - 01/30/20 1530      Quality of Life   Select Quality of Life      Quality of Life Scores   Health/Function Pre 28.8 %    Socioeconomic Pre 30 %    Psych/Spiritual Pre 29.14 %    Family Pre 30 %    GLOBAL Pre 29.29 %          Scores of 19 and below usually indicate a poorer quality of life in these areas.  A difference of  2-3 points is a clinically meaningful difference.  A difference of 2-3 points in the total score of the Quality of Life Index has been associated with significant improvement in overall quality of life, self-image, physical symptoms, and general health in studies assessing change in quality of life.  PHQ-9: Recent Review Flowsheet Data    Depression screen [facility] 2/9 01/30/2020   Decreased Interest 0   Down, Depressed,  Hopeless 0   PHQ - 2 Score 0   Altered sleeping 0   Tired, decreased energy 0   Change in appetite 0   Feeling bad or failure about yourself  0   Trouble concentrating 0   Moving slowly or fidgety/restless 0   Suicidal thoughts 0   PHQ-9 Score 0   Difficult doing work/chores Not difficult at all     Interpretation of Total Score  Total Score Depression Severity:  1-4 = Minimal depression, 5-9 = Mild depression, 10-14 = Moderate depression, 15-19 = Moderately severe depression, 20-27 = Severe depression   Psychosocial Evaluation and Intervention:  Psychosocial Evaluation - 01/26/20 1418      Psychosocial Evaluation & Interventions   Comments 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 He has a history of renal transplant in 2011 and states all is going well with that. He does have some issues sleeping because some of his medications have to be taken later, but he is getting used to it. He doesn't report any major stressors besides being focused on getting better. He and his wife are looking forward to the education during the program and he is ready to boost his stamina and strength in order to get back to work.    Expected Outcomes Short: attend cardiac rehab for education and exercise. Long: develop positive self care habits.           Psychosocial Re-Evaluation:  Psychosocial Re-Evaluation    Row Name 03/07/20 0932 03/26/20 0926 04/16/20 0930         Psychosocial Re-Evaluation   Current issues with Current Stress Concerns -- None Identified     Comments Cornelia Copa recently/suddenly lost his mom.  They have been trying to pack things up, she lived in [place] but wanted to be buried in her home town on [place] so he had some scheduling on logistics for that.  His brother who lives near by here was able to help as well. They  are headed back up there again next week to finish up some of the packing.  His managing and coping the best he can.  He is sleeping well. Otherwise, he is doing pretty good. Bradlee states hes sleeping well and has no signs of depression /anxiety,etc. Shreyansh is able to sleep on both sides now and sleeps well.  He reports no signs of anxiety or depression.  He rides hi smotorcycle to reduce stress.     Expected Outcomes Short: Finish up packing up mom's things Long: Continue to cope with death positively Short: get back to exercise to help manage stress Long: maintain positive outlook Short: continue to manage stress with exercise and activity Long: maintain good sleep patterns and positive outlook     Interventions Encouraged to attend Cardiac Rehabilitation for the exercise;Stress management education -- Encouraged to attend Cardiac Rehabilitation for the exercise     Continue Psychosocial Services  Follow up required by staff -- --            Psychosocial Discharge (Final Psychosocial Re-Evaluation):  Psychosocial Re-Evaluation - 04/16/20 0930      Psychosocial Re-Evaluation   Current issues with None Identified    Comments Antwoine is able to sleep on both sides now and sleeps well.  He reports no signs of anxiety or depression.  He rides hi smotorcycle to reduce stress.    Expected Outcomes Short: continue to manage stress with exercise and activity Long: maintain good sleep patterns and positive outlook    Interventions Encouraged to attend Cardiac Rehabilitation for the exercise           Vocational Rehabilitation: Provide vocational rehab assistance to qualifying candidates.   Vocational Rehab Evaluation & Intervention:  Vocational Rehab - 01/26/20 1411      Initial Vocational Rehab Evaluation & Intervention   Assessment shows need for Vocational Rehabilitation No           Education: Education Goals: Education classes will be provided on a variety of topics geared toward  better understanding of heart health and risk factor modification. Participant will state understanding/return demonstration of topics presented as noted by education test scores.  Learning Barriers/Preferences:  Learning Barriers/Preferences - 01/26/20 1410      Learning Barriers/Preferences   Learning Barriers None    Learning Preferences None           General Cardiac Education Topics:  AED/CPR: - Group verbal and written instruction with the use of models to demonstrate the basic use of the AED with the basic ABC's of resuscitation.   Anatomy and Cardiac Procedures: - Group verbal and visual presentation and models provide information about basic cardiac anatomy and function. Reviews the testing methods done to diagnose heart disease and the outcomes of the test results. Describes the treatment choices: Medical Management, Angioplasty, or Coronary Bypass Surgery for treating various heart conditions including Myocardial Infarction, Angina, Valve Disease, and Cardiac Arrhythmias.  Written material given at graduation. Flowsheet Row Cardiac Rehab from 05/02/2020 in [facility] Cardiac and Pulmonary Rehab  Education need identified 01/30/20  Date 02/08/20  Educator SB  Instruction Review Code 1- Verbalizes Understanding      Medication Safety: - Group verbal and visual instruction to review commonly prescribed medications for heart and lung disease. Reviews the medication, class of the drug, and side effects. Includes the steps to properly store meds and maintain the prescription regimen.  Written material given at graduation. Flowsheet Row Cardiac Rehab from 05/02/2020 in [facility] Cardiac and Pulmonary Rehab  Date 05/02/20  Educator [facility]  Instruction Review Code 1- Verbalizes Understanding      Intimacy: - Group verbal instruction through game format to discuss how heart and lung disease can affect sexual intimacy. Written material given at graduation..   Know Your Numbers and Heart  Failure: - Group verbal and visual instruction to discuss disease risk  factors for cardiac and pulmonary disease and treatment options.  Reviews associated critical values for Overweight/Obesity, Hypertension, Cholesterol, and Diabetes.  Discusses basics of heart failure: signs/symptoms and treatments.  Introduces Heart Failure Zone chart for action plan for heart failure.  Written material given at graduation.   Infection Prevention: - Provides verbal and written material to individual with discussion of infection control including proper hand washing and proper equipment cleaning during exercise session. Flowsheet Row Cardiac Rehab from 05/02/2020 in [facility] Cardiac and Pulmonary Rehab  Date 01/30/20  Educator [facility]  Instruction Review Code 1- Verbalizes Understanding      Falls Prevention: - Provides verbal and written material to individual with discussion of falls prevention and safety. Flowsheet Row Cardiac Rehab from 05/02/2020 in [facility] Cardiac and Pulmonary Rehab  Date 01/30/20  Educator [facility]  Instruction Review Code 1- Verbalizes Understanding      Other: -Provides group and verbal instruction on various topics (see comments)   Knowledge Questionnaire Score:  Knowledge Questionnaire Score - 01/30/20 1531      Knowledge Questionnaire Score   Pre Score 21/26 Education Focus: MI, angina, nutrtion, exercise           Core Components/Risk Factors/Patient Goals at Admission:  Personal Goals and Risk Factors at Admission - 01/30/20 1532      Core Components/Risk Factors/Patient Goals on Admission    Weight Management Yes;Weight Loss    Intervention Weight Management: Develop a combined nutrition and exercise program designed to reach desired caloric intake, while maintaining appropriate intake of nutrient and fiber, sodium and fats, and appropriate energy expenditure required for the weight goal.;Weight Management: Provide education and appropriate resources to help participant work  on and attain dietary goals.    Admit Weight 195 lb 11.2 oz (88.8 kg)    Goal Weight: Short Term 190 lb (86.2 kg)    Goal Weight: Long Term 190 lb (86.2 kg)    Expected Outcomes Short Term: Continue to assess and modify interventions until short term weight is achieved;Long Term: Adherence to nutrition and physical activity/exercise program aimed toward attainment of established weight goal;Weight Loss: Understanding of general recommendations for a balanced deficit meal plan, which promotes 1-2 lb weight loss per week and includes a negative energy balance of [phone] kcal/d;Understanding recommendations for meals to include 15-35% energy as protein, 25-35% energy from fat, 35-60% energy from carbohydrates, less than 214m of dietary cholesterol, 20-35 gm of total fiber daily;Understanding of distribution of calorie intake throughout the day with the consumption of 4-5 meals/snacks    Diabetes Yes    Intervention Provide education about signs/symptoms and action to take for hypo/hyperglycemia.;Provide education about proper nutrition, including hydration, and aerobic/resistive exercise prescription along with prescribed medications to achieve blood glucose in normal ranges: Fasting glucose 65-99 mg/dL    Expected Outcomes Short Term: Participant verbalizes understanding of the signs/symptoms and immediate care of hyper/hypoglycemia, proper foot care and importance of medication, aerobic/resistive exercise and nutrition plan for blood glucose control.;Long Term: Attainment of HbA1C < 7%.    Heart Failure Yes    Intervention Provide a combined exercise and nutrition program that is supplemented with education, support and counseling about heart failure. Directed toward relieving symptoms such as shortness of breath, decreased exercise tolerance, and extremity edema.    Expected Outcomes Short term: Attendance in program 2-3 days a week with increased exercise capacity. Reported lower sodium intake. Reported  increased fruit and vegetable intake. Reports medication compliance.;Improve functional capacity of life;Short term: Daily weights obtained and  reported for increase. Utilizing diuretic protocols set by physician.;Long term: Adoption of self-care skills and reduction of barriers for early signs and symptoms recognition and intervention leading to self-care maintenance.    Hypertension Yes    Intervention Provide education on lifestyle modifcations including regular physical activity/exercise, weight management, moderate sodium restriction and increased consumption of fresh fruit, vegetables, and low fat dairy, alcohol moderation, and smoking cessation.;Monitor prescription use compliance.    Expected Outcomes Short Term: Continued assessment and intervention until BP is < 140/82m HG in hypertensive participants. < 130/818mHG in hypertensive participants with diabetes, heart failure or chronic kidney disease.;Long Term: Maintenance of blood pressure at goal levels.    Lipids Yes    Intervention Provide education and support for participant on nutrition & aerobic/resistive exercise along with prescribed medications to achieve LDL <7019mHDL >42m72m  Expected Outcomes Short Term: Participant states understanding of desired cholesterol values and is compliant with medications prescribed. Participant is following exercise prescription and nutrition guidelines.;Long Term: Cholesterol controlled with medications as prescribed, with individualized exercise RX and with personalized nutrition plan. Value goals: LDL < 70mg5mL > 40 mg.           Education:Diabetes - Individual verbal and written instruction to review signs/symptoms of diabetes, desired ranges of glucose level fasting, after meals and with exercise. Acknowledge that pre and post exercise glucose checks will be done for 3 sessions at entry of program. [place] [facility]  Date 01/26/20   Educator [facility] Review Code 1- Verbalizes Understanding      Core Components/Risk Factors/Patient Goals Review:   Goals and Risk Factor Review    Row Name 03/07/20 0937 [id number] [id number] 04/16/20 0926         Core Components/Risk Factors/Patient Goals Review   Personal Goals Review Weight Management/Obesity;Diabetes;Hypertension;Lipids;Heart Failure -- Weight Management/Obesity;Diabetes;Hypertension;Lipids     Review EugenKarlosoing well with rehab.  He is down to 185 lb at home.  He weighs routinely.  His pressures have been good and he checks them at home.  His sugars have been doing well.  His fasting levels have been around 135 in morning.  He is hoping it will contnue to trend down for him.  He is doing well with his medications overall.  He has a cardiology follow up tomorrow and an echo.  He is hoping to get his LifeVest off!! EugenAvishhecking BP and BG at home.  He sees EP Dr. next week.  His EF is up to 35-40% from most recent echo.  Fasting BG was 98 yesterday. EugenElissee the EP - they want to do an ICD.  He is not sure if he wants to have the procedure.  He goes back March 30.  He continues to monitor BP and BG at home.  FBG was 125 today.  BP 108/62 at HT.  He had lab work done last week - cholesterol last time was 152.  His weight is staying steady.     Expected Outcomes Short: Go to cardiology follow up and continue to work on diabetes management Long; Continue to monitor risk factors. Short: continue to monitor risk factors and see Dr as recommended Long: manage risk factors and maintain at ideal range Short: follow up with Dr about ICD Long: manage risk factors            Core Components/Risk Factors/Patient Goals at Discharge (Final Review):   Goals  and Risk Factor Review - 04/16/20 0926      Core Components/Risk Factors/Patient Goals Review   Personal Goals Review Weight Management/Obesity;Diabetes;Hypertension;Lipids    Review Avel did see the EP - they want  to do an ICD.  He is not sure if he wants to have the procedure.  He goes back March 30.  He continues to monitor BP and BG at home.  FBG was 125 today.  BP 108/62 at HT.  He had lab work done last week - cholesterol last time was 152.  His weight is staying steady.    Expected Outcomes Short: follow up with Dr about ICD Long: manage risk factors           ITP Comments:  ITP Comments    Row Name 01/26/20 1404 01/30/20 1526 02/08/20 0931 02/14/20 0522 02/20/20 0740   ITP Comments Initial telephone orientation completed. Diagnosis can be found in [facility] 10/19. EP orientation scheduled for Tuesday 12/14 at 10am. Completed 6MWT and gym orientation. Initial ITP created and sent for review to Dr. Emily Filbert, Medical Director. First full day of exercise!  Patient was oriented to gym and equipment including functions, settings, policies, and procedures.  Patient's individual exercise prescription and treatment plan were reviewed.  All starting workloads were established based on the results of the 6 minute walk test done at initial orientation visit.  The plan for exercise progression was also introduced and progression will be customized based on patient's performance and goals. 30 Day review completed. Medical Director ITP review done, changes made as directed, and signed approval by Medical Director. Pt's wife called to let us know that his mother had passed on Mar 13, 2020.  She lived in [place] and they will need to go up there to handle affairs.  He will be out at least the next two weeks.   [place] Name 02/29/20 1130 03/13/20 0846 03/26/20 2633 04/10/20 0608 04/10/20 1521   ITP Comments Torre has not attended sicne last review due to a death in his family. 30 Day review completed. Medical Director ITP review done, changes made as directed, and signed approval by Medical Director. Returns today after a death in his family 66 Day review completed. Medical Director ITP review done, changes made as directed, and signed  approval by Medical Director. Completed initial RD evaluation          Comments:

## 2020-05-08 NOTE — Progress Notes (Signed)
Cardiac Individual Treatment Plan  Patient Details  Name: [name] Mcgough Jr. MRN: [id number] Date of Birth: 04/11/1960 Referring Provider:   Flowsheet Row Cardiac Rehab from 01/30/2020 in [facility]  Referring Provider McLean, Dalton MD      Initial Encounter Date:  Flowsheet Row Cardiac Rehab from 01/30/2020 in [facility]  Date 01/30/20      Visit Diagnosis: No diagnosis found.  Patient's Home Medications on Admission:  Current Outpatient Medications:  .  amiodarone (PACERONE) 200 MG tablet, Take 1 tablet (200 mg total) by mouth daily., Disp: 30 tablet, Rfl: 6 .  carvedilol (COREG) 12.5 MG tablet, Take 1.5 tablets (18.75 mg total) by mouth 2 (two) times daily., Disp: 270 tablet, Rfl: 3 .  clopidogrel (PLAVIX) 75 MG tablet, Take 1 tablet (75 mg total) by mouth daily., Disp: 30 tablet, Rfl: 6 .  dapagliflozin propanediol (FARXIGA) 10 MG TABS tablet, Take 1 tablet (10 mg total) by mouth daily before breakfast., Disp: 90 tablet, Rfl: 3 .  glipiZIDE (GLUCOTROL) 10 MG tablet, Take 10 mg by mouth 2 (two) times daily before a meal., Disp: , Rfl:  .  isosorbide-hydrALAZINE (BIDIL) 20-37.5 MG tablet, Take 2 tablets by mouth 3 (three) times daily., Disp: 180 tablet, Rfl: 6 .  loratadine (CLARITIN) 10 MG tablet, Take 10 mg by mouth daily as needed for allergies., Disp: , Rfl:  .  nitroGLYCERIN (NITROSTAT) 0.4 MG SL tablet, Place 1 tablet (0.4 mg total) under the tongue every 5 (five) minutes as needed for chest pain., Disp: 100 tablet, Rfl: 3 .  pantoprazole (PROTONIX) 40 MG tablet, TAKE 1 TABLET BY MOUTH EVERY DAY, Disp: 90 tablet, Rfl: 1 .  predniSONE (DELTASONE) 5 MG tablet, Take 1 tablet (5 mg total) by mouth daily with breakfast., Disp: , Rfl:  .  rosuvastatin (CRESTOR) 40 MG tablet, Take 1 tablet (40 mg total) by mouth daily., Disp: 30 tablet, Rfl: 6 .  simethicone (MYLICON) 125 MG chewable tablet, Chew 125 mg by mouth every 6 (six) hours as  needed for flatulence., Disp: , Rfl:  .  tacrolimus (PROGRAF) 1 MG capsule, Take 4 mg by mouth 2 (two) times daily., Disp: , Rfl:  .  torsemide (DEMADEX) 20 MG tablet, Take 1 tablet (20 mg total) by mouth as needed. 3 pound weight gain in 24 hours, Disp: 30 tablet, Rfl: 6 .  warfarin (COUMADIN) 1 MG tablet, TAKE 3 TO 4 TABLETS DAILY AS DIRECTED BY ANTICOAGULATION CLINIC, Disp: 110 tablet, Rfl: 1  Past Medical History: Past Medical History:  Diagnosis Date  . CHF (congestive heart failure) (HCC)   . Chronic kidney disease 04/2009   Kidney Transplant  . Diabetes mellitus   . GERD (gastroesophageal reflux disease)    as needed reflux  . Heart attack (HCC) 02/02/2020  . Hypertension   . 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    Tobacco Use: Social History   Tobacco Use  Smoking Status Never Smoker  Smokeless Tobacco Never Used    Labs: Recent Review Flowsheet Data    Labs for ITP Cardiac and Pulmonary Rehab Latest Ref Rng & Units 12/12/2019 12/13/2019 12/14/2019 12/15/2019 12/26/2019   Cholestrol 0 - 200 mg/dL - - - - 147   LDLCALC 0 - 99 mg/dL - - - - 68   HDL >40 mg/dL - - - - 43   Trlycerides <150 mg/dL - - - - 181(H)   Hemoglobin A1c   4.8 - 5.6 % - - - - -   PHART 7.350 - 7.450 - - - - -   PCO2ART 32.0 - 48.0 mmHg - - - - -   HCO3 20.0 - 28.0 mmol/L - - - - -   TCO2 22 - 32 mmol/L - - - - -   ACIDBASEDEF 0.0 - 2.0 mmol/L - - - - -   O2SAT % 65.2 70.7 68.9 72.2 -       Exercise Target Goals: Exercise Program Goal: Individual exercise prescription set using results from initial 6 min walk test and THRR while considering  patient's activity barriers and safety.   Exercise Prescription Goal: Initial exercise prescription builds to 30-45 minutes a day of aerobic activity, 2-3 days per week.  Home exercise guidelines will be given to patient during program as part of exercise prescription that the participant will acknowledge.   Education:  Aerobic Exercise: - Group verbal and visual presentation on the components of exercise prescription. Introduces F.I.T.T principle from ACSM for exercise prescriptions.  Reviews F.I.T.T. principles of aerobic exercise including progression. Written material given at graduation. Flowsheet Row Cardiac Rehab from 05/02/2020 in [facility]  Education need identified 01/30/20      Education: Resistance Exercise: - Group verbal and visual presentation on the components of exercise prescription. Introduces F.I.T.T principle from ACSM for exercise prescriptions  Reviews F.I.T.T. principles of resistance exercise including progression. Written material given at graduation. Flowsheet Row Cardiac Rehab from 05/02/2020 in [facility]  Date 02/08/20  Educator JeH  Instruction Review Code 1- Verbalizes Understanding       Education: Exercise & Equipment Safety: - Individual verbal instruction and demonstration of equipment use and safety with use of the equipment. Flowsheet Row Cardiac Rehab from 05/02/2020 in [facility]  Date 01/30/20  Educator JH  Instruction Review Code 1- Verbalizes Understanding      Education: Exercise Physiology & General Exercise Guidelines: - Group verbal and written instruction with models to review the exercise physiology of the cardiovascular system and associated critical values. Provides general exercise guidelines with specific guidelines to those with heart or lung disease.  Flowsheet Row Cardiac Rehab from 05/02/2020 in [facility]  Date 03/28/20  Educator JH  Instruction Review Code 1- Verbalizes Understanding      Education: Flexibility, Balance, Mind/Body Relaxation: - Group verbal and visual presentation with interactive activity on the components of exercise prescription. Introduces F.I.T.T principle from ACSM for exercise prescriptions. Reviews F.I.T.T. principles of  flexibility and balance exercise training including progression. Also discusses the mind body connection.  Reviews various relaxation techniques to help reduce and manage stress (i.e. Deep breathing, progressive muscle relaxation, and visualization). Balance handout provided to take home. Written material given at graduation. Flowsheet Row Cardiac Rehab from 05/02/2020 in [facility]  Date 04/18/20  Educator AS  Instruction Review Code 1- Verbalizes Understanding      Activity Barriers & Risk Stratification:  Activity Barriers & Cardiac Risk Stratification - 01/30/20 1527      Activity Barriers & Cardiac Risk Stratification   Activity Barriers Other (comment);Deconditioning    Comments Life Vest; Renal Transplant, L shoulder tingly    Cardiac Risk Stratification High           6 Minute Walk:  6 Minute Walk    Row Name 01/30/20 1526         6 Minute Walk     Phase Initial     Distance 1230 feet     Walk Time 6 minutes     # of Rest Breaks 0     MPH 2.33     METS 3.71     RPE 9     VO2 Peak 12.97     Symptoms No     Resting HR 75 bpm     Resting BP 128/70     Resting Oxygen Saturation  97 %     Exercise Oxygen Saturation  during 6 min walk 96 %     Max Ex. HR 101 bpm     Max Ex. BP 146/64     2 Minute Post BP 126/64            Oxygen Initial Assessment:   Oxygen Re-Evaluation:   Oxygen Discharge (Final Oxygen Re-Evaluation):   Initial Exercise Prescription:  Initial Exercise Prescription - 01/30/20 1500      Date of Initial Exercise RX and Referring Provider   Date 01/30/20    Referring Provider Loralie Champagne MD      Treadmill   MPH 2.3    Grade 1    Minutes 15    METs 3.08      NuStep   Level 4    SPM 80    Minutes 15    METs 3      Elliptical   Level 1    Speed 3.5    Minutes 15    METs 3      Biostep-RELP   Level 4    SPM 50    Minutes 15    METs 3      Prescription Details   Frequency (times per week) 2     Duration Progress to 30 minutes of continuous aerobic without signs/symptoms of physical distress      Intensity   THRR 40-80% of Max Heartrate 109-144    Ratings of Perceived Exertion 11-13    Perceived Dyspnea 0-4      Progression   Progression Continue to progress workloads to maintain intensity without signs/symptoms of physical distress.      Resistance Training   Training Prescription Yes    Weight 4 lb    Reps 10-15           Perform Capillary Blood Glucose checks as needed.  Exercise Prescription Changes:  Exercise Prescription Changes    Row Name 01/30/20 1500 02/13/20 1200 03/07/20 0900 03/13/20 1400 03/27/20 1500     Response to Exercise   Blood Pressure (Admit) 128/70 142/76 -- 108/62 110/62   Blood Pressure (Exercise) 146/64 152/64 -- 114/66 118/62   Blood Pressure (Exit) 126/64 118/60 -- 110/60 112/64   Heart Rate (Admit) 75 bpm 73 bpm -- 69 bpm 83 bpm   Heart Rate (Exercise) 101 bpm 92 bpm -- 90 bpm 98 bpm   Heart Rate (Exit) 76 bpm 76 bpm -- 75 bpm 71 bpm   Oxygen Saturation (Admit) 97 % -- -- -- --   Oxygen Saturation (Exercise) 96 % -- -- -- --   Rating of Perceived Exertion (Exercise) 9 12 -- 12 11   Symptoms none none -- none none   Comments walk test results -- -- -- --   Duration -- -- -- Continue with 30 min of aerobic exercise without signs/symptoms of physical distress. Continue with 30 min of aerobic exercise without signs/symptoms of physical distress.   Intensity -- -- -- THRR unchanged THRR unchanged  Progression   Progression -- -- -- Continue to progress workloads to maintain intensity without signs/symptoms of physical distress. Continue to progress workloads to maintain intensity without signs/symptoms of physical distress.   Average METs -- -- -- 2.7 3.55     Resistance Training   Training Prescription -- Yes -- Yes Yes   Weight -- 4 lb -- 4 lb 4 lb   Reps -- 10-15 -- 10-15 10-15     Interval Training   Interval Training -- -- --  No No     Treadmill   MPH -- 2.3 -- 2.4 --   Grade -- 1 -- 0.5 --   Minutes -- 15 -- 15 --   METs -- 3.08 -- 3 --     NuStep   Level -- 4 -- -- 4   SPM -- 80 -- -- 80   Minutes -- 15 -- -- 15   METs -- 2.1 -- -- 4     T5 Nustep   Level -- -- -- 4 --   Minutes -- -- -- 15 --   METs -- -- -- 2.4 --     Biostep-RELP   Level -- -- -- -- 4   SPM -- -- -- -- 50   Minutes -- -- -- -- 15   METs -- -- -- -- 4     Home Exercise Plan   Plans to continue exercise at -- -- Home (comment)  walking, weights, staff videos Home (comment)  walking, weights, staff videos Home (comment)  walking, weights, staff videos   Frequency -- -- Add 2 additional days to program exercise sessions. Add 2 additional days to program exercise sessions. Add 2 additional days to program exercise sessions.   Initial Home Exercises Provided -- -- 03/07/20 03/07/20 03/07/20   Row Name 04/10/20 0700 04/23/20 0800 05/06/20 1500         Response to Exercise   Blood Pressure (Admit) 108/60 114/54 102/64     Blood Pressure (Exercise) 112/76 120/60 122/60     Blood Pressure (Exit) 108/52 124/64 104/54     Heart Rate (Admit) 64 bpm 82 bpm 73 bpm     Heart Rate (Exercise) 94 bpm 88 bpm 83 bpm     Heart Rate (Exit) 71 bpm 65 bpm 66 bpm     Rating of Perceived Exertion (Exercise) 12 12 12     Symptoms none none none     Duration Continue with 30 min of aerobic exercise without signs/symptoms of physical distress. Continue with 30 min of aerobic exercise without signs/symptoms of physical distress. Continue with 30 min of aerobic exercise without signs/symptoms of physical distress.     Intensity THRR unchanged THRR unchanged THRR unchanged           Progression   Progression Continue to progress workloads to maintain intensity without signs/symptoms of physical distress. Continue to progress workloads to maintain intensity without signs/symptoms of physical distress. Continue to progress workloads to maintain intensity  without signs/symptoms of physical distress.     Average METs 2.85 2.95 2.83           Resistance Training   Training Prescription Yes Yes Yes     Weight 4 lb 5 lb 5 lb     Reps 10-15 10-15 10-15           Interval Training   Interval Training No -- No           Treadmill   MPH 3 --   2.3     Grade 0.5 -- 1     Minutes 15 -- 15     METs 3.5 -- 3.08           NuStep   Level 4 -- --     Minutes 15 -- --     METs 2.6 -- --           Elliptical   Level 1 -- 2     Speed 3.5 -- 3.5     Minutes 15 -- 15     METs 2.2 -- 2.4           REL-XR   Level -- -- 2     Minutes -- -- 15     METs -- -- 3           Biostep-RELP   Level 4 -- --     Minutes 15 -- --     METs 3 -- --           Home Exercise Plan   Plans to continue exercise at Home (comment)  walking, weights, staff videos -- Home (comment)  walking, weights, staff videos     Frequency Add 2 additional days to program exercise sessions. -- Add 2 additional days to program exercise sessions.     Initial Home Exercises Provided 03/07/20 -- 03/07/20            Exercise Comments:  Exercise Comments    Row Name 02/08/20 0932 03/26/20 0938         Exercise Comments First full day of exercise!  Patient was oriented to gym and equipment including functions, settings, policies, and procedures.  Patient's individual exercise prescription and treatment plan were reviewed.  All starting workloads were established based on the results of the 6 minute walk test done at initial orientation visit.  The plan for exercise progression was also introduced and progression will be customized based on patient's performance and goals. Returns today after a death in his family             Exercise Goals and Review:  Exercise Goals    Row Name 01/30/20 1529             Exercise Goals   Increase Physical Activity Yes       Intervention Provide advice, education, support and counseling about physical activity/exercise  needs.;Develop an individualized exercise prescription for aerobic and resistive training based on initial evaluation findings, risk stratification, comorbidities and participant's personal goals.       Expected Outcomes Short Term: Attend rehab on a regular basis to increase amount of physical activity.;Long Term: Add in home exercise to make exercise part of routine and to increase amount of physical activity.;Long Term: Exercising regularly at least 3-5 days a week.       Increase Strength and Stamina Yes       Intervention Provide advice, education, support and counseling about physical activity/exercise needs.;Develop an individualized exercise prescription for aerobic and resistive training based on initial evaluation findings, risk stratification, comorbidities and participant's personal goals.       Expected Outcomes Short Term: Increase workloads from initial exercise prescription for resistance, speed, and METs.;Short Term: Perform resistance training exercises routinely during rehab and add in resistance training at home;Long Term: Improve cardiorespiratory fitness, muscular endurance and strength as measured by increased METs and functional capacity (6MWT)       Able to understand and use rate of perceived exertion (RPE) scale Yes         Intervention Provide education and explanation on how to use RPE scale       Expected Outcomes Long Term:  Able to use RPE to guide intensity level when exercising independently;Short Term: Able to use RPE daily in rehab to express subjective intensity level       Able to understand and use Dyspnea scale Yes       Intervention Provide education and explanation on how to use Dyspnea scale       Expected Outcomes Long Term: Able to use Dyspnea scale to guide intensity level when exercising independently;Short Term: Able to use Dyspnea scale daily in rehab to express subjective sense of shortness of breath during exertion       Knowledge and understanding of  Target Heart Rate Range (THRR) Yes       Intervention Provide education and explanation of THRR including how the numbers were predicted and where they are located for reference       Expected Outcomes Short Term: Able to state/look up THRR;Short Term: Able to use daily as guideline for intensity in rehab;Long Term: Able to use THRR to govern intensity when exercising independently       Able to check pulse independently Yes       Intervention Provide education and demonstration on how to check pulse in carotid and radial arteries.;Review the importance of being able to check your own pulse for safety during independent exercise       Expected Outcomes Short Term: Able to explain why pulse checking is important during independent exercise;Long Term: Able to check pulse independently and accurately       Understanding of Exercise Prescription Yes       Intervention Provide education, explanation, and written materials on patient's individual exercise prescription       Expected Outcomes Short Term: Able to explain program exercise prescription;Long Term: Able to explain home exercise prescription to exercise independently              Exercise Goals Re-Evaluation :  Exercise Goals Re-Evaluation    Row Name 02/08/20 0932 03/07/20 0931 03/13/20 1407 03/26/20 0928 04/10/20 0738     Exercise Goal Re-Evaluation   Exercise Goals Review Increase Physical Activity;Able to understand and use rate of perceived exertion (RPE) scale;Knowledge and understanding of Target Heart Rate Range (THRR);Understanding of Exercise Prescription;Increase Strength and Stamina;Able to understand and use Dyspnea scale;Able to check pulse independently Increase Physical Activity;Increase Strength and Stamina;Understanding of Exercise Prescription Increase Physical Activity;Increase Strength and Stamina;Understanding of Exercise Prescription Increase Physical Activity;Increase Strength and Stamina Increase Physical  Activity;Increase Strength and Stamina;Understanding of Exercise Prescription   Comments Reviewed RPE and dyspnea scales, THR and program prescription with pt today.  Pt voiced understanding and was given a copy of goals to take home. Baylee returned today after being in [place] and finallizing things after his mother's passing.  He was able to exercise while he was up there.  He was moving furniture and walking 4-5 blocks each day.  He does feel like his strength and stamina are starting to reocover some.  Reviewed home exercise with pt today.  Pt plans to walking and weights at home for exercise.  We also talked about using the staff videos for bad weather days. Reviewed THR, pulse, RPE, sign and symptoms, pulse oximetery and when to call 911 or MD.  Also discussed weather considerations and indoor options.  Pt voiced understanding. Only one visit this month after his mother's passing.  He is   back in [place] this week again.  He is planning to try to exercise more up there this time. Yulian did do some push ups and squats while he was away.  He is gald to be back to [facility]! Velmer is doing well back in rehab.  He is coming regularly, he even tried out the elliptical and doing well on it.  He is up to 2.2 METs on it.  We will continue to monitor his progress.   Expected Outcomes Short: Use RPE daily to regulate intensity. Long: Follow program prescription in THR. Short: Start to add in exercise at home Long: Continue to exercise independently Short: Return to regular attendance  Long: Continue to improve stamina Short:  get back to regular attendance Long:  improve overall stamina Short: Continue to improve on elliptical and get into home routine Long: Continue to improve stamina   Row Name 04/16/20 0932 04/23/20 0853 05/06/20 1554         Exercise Goal Re-Evaluation   Exercise Goals Review Increase Physical Activity;Increase Strength and Stamina Increase Physical Activity;Increase Strength and Stamina  Increase Physical Activity;Increase Strength and Stamina;Understanding of Exercise Prescription     Comments Konrad Penta does some strength exercises and walking outside class.  We reviewed monitoring RPE, HR during exercise. Wilkes is progressing well and uses the elliptical some sessions.  He has improved MET level.  He has increased to 5 lb for strength training. Nishawn is doing well in rehab.  He is up to 2.4 METs on the treadmill.  He has only been doing 2.3 mph on the treadmill, we will talk about moving back up to 3.  We will continue to monitor his progress.     Expected Outcomes Short: check HR during exercise Long: increase  MET level Short: continue to exercise consistently Long:  improve overall stamina Short: Increase treadmill back to 3 mph Long; continue to improve stamina            Discharge Exercise Prescription (Final Exercise Prescription Changes):  Exercise Prescription Changes - 05/06/20 1500      Response to Exercise   Blood Pressure (Admit) 102/64    Blood Pressure (Exercise) 122/60    Blood Pressure (Exit) 104/54    Heart Rate (Admit) 73 bpm    Heart Rate (Exercise) 83 bpm    Heart Rate (Exit) 66 bpm    Rating of Perceived Exertion (Exercise) 12    Symptoms none    Duration Continue with 30 min of aerobic exercise without signs/symptoms of physical distress.    Intensity THRR unchanged      Progression   Progression Continue to progress workloads to maintain intensity without signs/symptoms of physical distress.    Average METs 2.83      Resistance Training   Training Prescription Yes    Weight 5 lb    Reps 10-15      Interval Training   Interval Training No      Treadmill   MPH 2.3    Grade 1    Minutes 15    METs 3.08      Elliptical   Level 2    Speed 3.5    Minutes 15    METs 2.4      REL-XR   Level 2    Minutes 15    METs 3      Home Exercise Plan   Plans to continue exercise at Home (comment)   walking, weights, staff videos   Frequency  Add  2 additional days to program exercise sessions.    Initial Home Exercises Provided 03/07/20           Nutrition:  Target Goals: Understanding of nutrition guidelines, daily intake of sodium <1500mg, cholesterol <200mg, calories 30% from fat and 7% or less from saturated fats, daily to have 5 or more servings of fruits and vegetables.  Education: All About Nutrition: -Group instruction provided by verbal, written material, interactive activities, discussions, models, and posters to present general guidelines for heart healthy nutrition including fat, fiber, MyPlate, the role of sodium in heart healthy nutrition, utilization of the nutrition label, and utilization of this knowledge for meal planning. Follow up email sent as well. Written material given at graduation.   Biometrics:  Pre Biometrics - 01/30/20 1530      Pre Biometrics   Height 6' 1" (1.854 m)    Weight 195 lb 11.2 oz (88.8 kg)    BMI (Calculated) 25.83    Single Leg Stand 30 seconds            Nutrition Therapy Plan and Nutrition Goals:  Nutrition Therapy & Goals - 04/10/20 1502      Nutrition Therapy   Diet Heart healthy, low Na, diabetes friendly    Drug/Food Interactions Statins/Certain Fruits;Coumadin/Vit K    Protein (specify units) 70g    Fiber 30 grams    Whole Grain Foods 3 servings    Saturated Fats 12 max. grams    Fruits and Vegetables 8 servings/day    Sodium 1.5 grams      Personal Nutrition Goals   Nutrition Goal ST: add 1 serving of complex carbohydrates to meals, he would like to include sweet potatoes LT: include all macronutrients in meals    Comments green beans, boiled chicken, salmon, baked pork chop, spinach, broccoli, boiled eggs, salads, berries, oranges, vegetables. BG in am 120-125, A1C 7.2. He has limited carbohydrates, recommended including some complex carbohydrates at least 1 exchange per meal. Pt is open to making changes. He reports speaking to a dietitian in the last two  months and feeling like he has no questions at this time. Reviewed heart healthy and diabetes friendly eating.      Intervention Plan   Intervention Prescribe, educate and counsel regarding individualized specific dietary modifications aiming towards targeted core components such as weight, hypertension, lipid management, diabetes, heart failure and other comorbidities.;Nutrition handout(s) given to patient.    Expected Outcomes Short Term Goal: Understand basic principles of dietary content, such as calories, fat, sodium, cholesterol and nutrients.;Short Term Goal: A plan has been developed with personal nutrition goals set during dietitian appointment.;Long Term Goal: Adherence to prescribed nutrition plan.           Nutrition Assessments:  MEDIFICTS Score Key:  ?70 Need to make dietary changes   40-70 Heart Healthy Diet  ? 40 Therapeutic Level Cholesterol Diet  Flowsheet Row Cardiac Rehab from 01/30/2020 in [facility]  Picture Your Plate Total Score on Admission 80     Picture Your Plate Scores:  <40 Unhealthy dietary pattern with much room for improvement.  41-50 Dietary pattern unlikely to meet recommendations for good health and room for improvement.  51-60 More healthful dietary pattern, with some room for improvement.   >60 Healthy dietary pattern, although there may be some specific behaviors that could be improved.    Nutrition Goals Re-Evaluation:  Nutrition Goals Re-Evaluation    Row Name 03/07/20 0958 04/09/20 1250 04/16/20 0944           Goals   Nutrition Goal Meet with dietician Meet with dietician ST: add 1 serving of complex carbohydrates to meals, he would like to include sweet potatoes LT: include all macronutrients in meals     Comment Rohin missed dietician appointment with his mom's death.  He has now rescheduled with her. Hendricks re-scheduled RD appointment - missed today's. He had met with me regarding nutrition last week and is  still working on making changes, he has added some sweet potatoes. Will continue to check in.     Expected Outcome Meet with dietician Meet with dietician ST: add 1 serving of complex carbohydrates to meals, he would like to include sweet potatoes LT: include all macronutrients in meals            Nutrition Goals Discharge (Final Nutrition Goals Re-Evaluation):  Nutrition Goals Re-Evaluation - 04/16/20 0944      Goals   Nutrition Goal ST: add 1 serving of complex carbohydrates to meals, he would like to include sweet potatoes LT: include all macronutrients in meals    Comment He had met with me regarding nutrition last week and is still working on making changes, he has added some sweet potatoes. Will continue to check in.    Expected Outcome ST: add 1 serving of complex carbohydrates to meals, he would like to include sweet potatoes LT: include all macronutrients in meals           Psychosocial: Target Goals: Acknowledge presence or absence of significant depression and/or stress, maximize coping skills, provide positive support system. Participant is able to verbalize types and ability to use techniques and skills needed for reducing stress and depression.   Education: Stress, Anxiety, and Depression - Group verbal and visual presentation to define topics covered.  Reviews how body is impacted by stress, anxiety, and depression.  Also discusses healthy ways to reduce stress and to treat/manage anxiety and depression.  Written material given at graduation.   Education: Sleep Hygiene -Provides group verbal and written instruction about how sleep can affect your health.  Define sleep hygiene, discuss sleep cycles and impact of sleep habits. Review good sleep hygiene tips.    Initial Review & Psychosocial Screening:  Initial Psych Review & Screening - 01/26/20 1411      Initial Review   Current issues with Current Sleep Concerns;Current Stress Concerns    Source of Stress Concerns  Occupation    Comments working on his health to return to work.      Family Dynamics   Good Support System? Yes   wife     Barriers   Psychosocial barriers to participate in program There are no identifiable barriers or psychosocial needs.;The patient should benefit from training in stress management and relaxation.      Screening Interventions   Interventions Encouraged to exercise;To provide support and resources with identified psychosocial needs;Provide feedback about the scores to participant    Expected Outcomes Short Term goal: Utilizing psychosocial counselor, staff and physician to assist with identification of specific Stressors or current issues interfering with healing process. Setting desired goal for each stressor or current issue identified.;Long Term Goal: Stressors or current issues are controlled or eliminated.;Short Term goal: Identification and review with participant of any Quality of Life or Depression concerns found by scoring the questionnaire.;Long Term goal: The participant improves quality of Life and PHQ9 Scores as seen by post scores and/or verbalization of changes           Quality of Life Scores:     Quality of Life - 01/30/20 1530      Quality of Life   Select Quality of Life      Quality of Life Scores   Health/Function Pre 28.8 %    Socioeconomic Pre 30 %    Psych/Spiritual Pre 29.14 %    Family Pre 30 %    GLOBAL Pre 29.29 %          Scores of 19 and below usually indicate a poorer quality of life in these areas.  A difference of  2-3 points is a clinically meaningful difference.  A difference of 2-3 points in the total score of the Quality of Life Index has been associated with significant improvement in overall quality of life, self-image, physical symptoms, and general health in studies assessing change in quality of life.  PHQ-9: Recent Review Flowsheet Data    Depression screen [facility] 2/9 01/30/2020   Decreased Interest 0   Down, Depressed,  Hopeless 0   PHQ - 2 Score 0   Altered sleeping 0   Tired, decreased energy 0   Change in appetite 0   Feeling bad or failure about yourself  0   Trouble concentrating 0   Moving slowly or fidgety/restless 0   Suicidal thoughts 0   PHQ-9 Score 0   Difficult doing work/chores Not difficult at all     Interpretation of Total Score  Total Score Depression Severity:  1-4 = Minimal depression, 5-9 = Mild depression, 10-14 = Moderate depression, 15-19 = Moderately severe depression, 20-27 = Severe depression   Psychosocial Evaluation and Intervention:  Psychosocial Evaluation - 01/26/20 1418      Psychosocial Evaluation & Interventions   Comments 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 He has a history of renal transplant in 2011 and states all is going well with that. He does have some issues sleeping because some of his medications have to be taken later, but he is getting used to it. He doesn't report any major stressors besides being focused on getting better. He and his wife are looking forward to the education during the program and he is ready to boost his stamina and strength in order to get back to work.    Expected Outcomes Short: attend cardiac rehab for education and exercise. Long: develop positive self care habits.           Psychosocial Re-Evaluation:  Psychosocial Re-Evaluation    Row Name 03/07/20 0932 03/26/20 0926 04/16/20 0930         Psychosocial Re-Evaluation   Current issues with Current Stress Concerns -- None Identified     Comments Cornelia Copa recently/suddenly lost his mom.  They have been trying to pack things up, she lived in [place] but wanted to be buried in her home town on [place] so he had some scheduling on logistics for that.  His brother who lives near by here was able to help as well. They  are headed back up there again next week to finish up some of the packing.  His managing and coping the best he can.  He is sleeping well. Otherwise, he is doing pretty good. Bradlee states hes sleeping well and has no signs of depression /anxiety,etc. Shreyansh is able to sleep on both sides now and sleeps well.  He reports no signs of anxiety or depression.  He rides hi smotorcycle to reduce stress.     Expected Outcomes Short: Finish up packing up mom's things Long: Continue to cope with death positively Short: get back to exercise to help manage stress Long: maintain positive outlook Short: continue to manage stress with exercise and activity Long: maintain good sleep patterns and positive outlook     Interventions Encouraged to attend Cardiac Rehabilitation for the exercise;Stress management education -- Encouraged to attend Cardiac Rehabilitation for the exercise     Continue Psychosocial Services  Follow up required by staff -- --            Psychosocial Discharge (Final Psychosocial Re-Evaluation):  Psychosocial Re-Evaluation - 04/16/20 0930      Psychosocial Re-Evaluation   Current issues with None Identified    Comments Yasseen is able to sleep on both sides now and sleeps well.  He reports no signs of anxiety or depression.  He rides hi smotorcycle to reduce stress.    Expected Outcomes Short: continue to manage stress with exercise and activity Long: maintain good sleep patterns and positive outlook    Interventions Encouraged to attend Cardiac Rehabilitation for the exercise           Vocational Rehabilitation: Provide vocational rehab assistance to qualifying candidates.   Vocational Rehab Evaluation & Intervention:  Vocational Rehab - 01/26/20 1411      Initial Vocational Rehab Evaluation & Intervention   Assessment shows need for Vocational Rehabilitation No           Education: Education Goals: Education classes will be provided on a variety of topics geared toward  better understanding of heart health and risk factor modification. Participant will state understanding/return demonstration of topics presented as noted by education test scores.  Learning Barriers/Preferences:  Learning Barriers/Preferences - 01/26/20 1410      Learning Barriers/Preferences   Learning Barriers None    Learning Preferences None           General Cardiac Education Topics:  AED/CPR: - Group verbal and written instruction with the use of models to demonstrate the basic use of the AED with the basic ABC's of resuscitation.   Anatomy and Cardiac Procedures: - Group verbal and visual presentation and models provide information about basic cardiac anatomy and function. Reviews the testing methods done to diagnose heart disease and the outcomes of the test results. Describes the treatment choices: Medical Management, Angioplasty, or Coronary Bypass Surgery for treating various heart conditions including Myocardial Infarction, Angina, Valve Disease, and Cardiac Arrhythmias.  Written material given at graduation. Flowsheet Row Cardiac Rehab from 05/02/2020 in [facility]  Education need identified 01/30/20  Date 02/08/20  Educator SB  Instruction Review Code 1- Verbalizes Understanding      Medication Safety: - Group verbal and visual instruction to review commonly prescribed medications for heart and lung disease. Reviews the medication, class of the drug, and side effects. Includes the steps to properly store meds and maintain the prescription regimen.  Written material given at graduation. Flowsheet Row Cardiac Rehab from 05/02/2020 in [facility]  Date 05/02/20  Educator MC  Instruction Review Code 1- Verbalizes Understanding      Intimacy: - Group verbal instruction through game format to discuss how heart and lung disease can affect sexual intimacy. Written material given at graduation..   Know Your Numbers and Heart  Failure: - Group verbal and visual instruction to discuss disease risk   factors for cardiac and pulmonary disease and treatment options.  Reviews associated critical values for Overweight/Obesity, Hypertension, Cholesterol, and Diabetes.  Discusses basics of heart failure: signs/symptoms and treatments.  Introduces Heart Failure Zone chart for action plan for heart failure.  Written material given at graduation.   Infection Prevention: - Provides verbal and written material to individual with discussion of infection control including proper hand washing and proper equipment cleaning during exercise session. Flowsheet Row Cardiac Rehab from 05/02/2020 in [facility]  Date 01/30/20  Educator JH  Instruction Review Code 1- Verbalizes Understanding      Falls Prevention: - Provides verbal and written material to individual with discussion of falls prevention and safety. Flowsheet Row Cardiac Rehab from 05/02/2020 in [facility]  Date 01/30/20  Educator JH  Instruction Review Code 1- Verbalizes Understanding      Other: -Provides group and verbal instruction on various topics (see comments)   Knowledge Questionnaire Score:  Knowledge Questionnaire Score - 01/30/20 1531      Knowledge Questionnaire Score   Pre Score 21/26 Education Focus: MI, angina, nutrtion, exercise           Core Components/Risk Factors/Patient Goals at Admission:  Personal Goals and Risk Factors at Admission - 01/30/20 1532      Core Components/Risk Factors/Patient Goals on Admission    Weight Management Yes;Weight Loss    Intervention Weight Management: Develop a combined nutrition and exercise program designed to reach desired caloric intake, while maintaining appropriate intake of nutrient and fiber, sodium and fats, and appropriate energy expenditure required for the weight goal.;Weight Management: Provide education and appropriate resources to help participant work  on and attain dietary goals.    Admit Weight 195 lb 11.2 oz (88.8 kg)    Goal Weight: Short Term 190 lb (86.2 kg)    Goal Weight: Long Term 190 lb (86.2 kg)    Expected Outcomes Short Term: Continue to assess and modify interventions until short term weight is achieved;Long Term: Adherence to nutrition and physical activity/exercise program aimed toward attainment of established weight goal;Weight Loss: Understanding of general recommendations for a balanced deficit meal plan, which promotes 1-2 lb weight loss per week and includes a negative energy balance of 500-1000 kcal/d;Understanding recommendations for meals to include 15-35% energy as protein, 25-35% energy from fat, 35-60% energy from carbohydrates, less than 200mg of dietary cholesterol, 20-35 gm of total fiber daily;Understanding of distribution of calorie intake throughout the day with the consumption of 4-5 meals/snacks    Diabetes Yes    Intervention Provide education about signs/symptoms and action to take for hypo/hyperglycemia.;Provide education about proper nutrition, including hydration, and aerobic/resistive exercise prescription along with prescribed medications to achieve blood glucose in normal ranges: Fasting glucose 65-99 mg/dL    Expected Outcomes Short Term: Participant verbalizes understanding of the signs/symptoms and immediate care of hyper/hypoglycemia, proper foot care and importance of medication, aerobic/resistive exercise and nutrition plan for blood glucose control.;Long Term: Attainment of HbA1C < 7%.    Heart Failure Yes    Intervention Provide a combined exercise and nutrition program that is supplemented with education, support and counseling about heart failure. Directed toward relieving symptoms such as shortness of breath, decreased exercise tolerance, and extremity edema.    Expected Outcomes Short term: Attendance in program 2-3 days a week with increased exercise capacity. Reported lower sodium intake. Reported  increased fruit and vegetable intake. Reports medication compliance.;Improve functional capacity of life;Short term: Daily weights obtained and   reported for increase. Utilizing diuretic protocols set by physician.;Long term: Adoption of self-care skills and reduction of barriers for early signs and symptoms recognition and intervention leading to self-care maintenance.    Hypertension Yes    Intervention Provide education on lifestyle modifcations including regular physical activity/exercise, weight management, moderate sodium restriction and increased consumption of fresh fruit, vegetables, and low fat dairy, alcohol moderation, and smoking cessation.;Monitor prescription use compliance.    Expected Outcomes Short Term: Continued assessment and intervention until BP is < 140/90mm HG in hypertensive participants. < 130/80mm HG in hypertensive participants with diabetes, heart failure or chronic kidney disease.;Long Term: Maintenance of blood pressure at goal levels.    Lipids Yes    Intervention Provide education and support for participant on nutrition & aerobic/resistive exercise along with prescribed medications to achieve LDL <70mg, HDL >40mg.    Expected Outcomes Short Term: Participant states understanding of desired cholesterol values and is compliant with medications prescribed. Participant is following exercise prescription and nutrition guidelines.;Long Term: Cholesterol controlled with medications as prescribed, with individualized exercise RX and with personalized nutrition plan. Value goals: LDL < 70mg, HDL > 40 mg.           Education:Diabetes - Individual verbal and written instruction to review signs/symptoms of diabetes, desired ranges of glucose level fasting, after meals and with exercise. Acknowledge that pre and post exercise glucose checks will be done for 3 sessions at entry of program. Flowsheet Row Cardiac Rehab from 05/02/2020 in [facility]  Date 01/26/20   Educator MC  Instruction Review Code 1- Verbalizes Understanding      Core Components/Risk Factors/Patient Goals Review:   Goals and Risk Factor Review    Row Name 03/07/20 0937 03/26/20 0923 04/16/20 0926         Core Components/Risk Factors/Patient Goals Review   Personal Goals Review Weight Management/Obesity;Diabetes;Hypertension;Lipids;Heart Failure -- Weight Management/Obesity;Diabetes;Hypertension;Lipids     Review Ranvir is doing well with rehab.  He is down to 185 lb at home.  He weighs routinely.  His pressures have been good and he checks them at home.  His sugars have been doing well.  His fasting levels have been around 135 in morning.  He is hoping it will contnue to trend down for him.  He is doing well with his medications overall.  He has a cardiology follow up tomorrow and an echo.  He is hoping to get his LifeVest off!! Vasco is checking BP and BG at home.  He sees EP Dr. next week.  His EF is up to 35-40% from most recent echo.  Fasting BG was 98 yesterday. [place] did see the EP - they want to do an ICD.  He is not sure if he wants to have the procedure.  He goes back March 30.  He continues to monitor BP and BG at home.  FBG was 125 today.  BP 108/62 at HT.  He had lab work done last week - cholesterol last time was 152.  His weight is staying steady.     Expected Outcomes Short: Go to cardiology follow up and continue to work on diabetes management Long; Continue to monitor risk factors. Short: continue to monitor risk factors and see Dr as recommended Long: manage risk factors and maintain at ideal range Short: follow up with Dr about ICD Long: manage risk factors            Core Components/Risk Factors/Patient Goals at Discharge (Final Review):   Goals   and Risk Factor Review - 04/16/20 0926      Core Components/Risk Factors/Patient Goals Review   Personal Goals Review Weight Management/Obesity;Diabetes;Hypertension;Lipids    Review [place] did see the EP - they want  to do an ICD.  He is not sure if he wants to have the procedure.  He goes back March 30.  He continues to monitor BP and BG at home.  FBG was 125 today.  BP 108/62 at HT.  He had lab work done last week - cholesterol last time was 152.  His weight is staying steady.    Expected Outcomes Short: follow up with Dr about ICD Long: manage risk factors           ITP Comments:  ITP Comments    Row Name 01/26/20 1404 01/30/20 1526 02/08/20 0931 02/14/20 0522 02/20/20 0740   ITP Comments Initial telephone orientation completed. Diagnosis can be found in [facility] 10/19. EP orientation scheduled for Tuesday 12/14 at 10am. Completed 6MWT and gym orientation. Initial ITP created and sent for review to Dr. Mark Miller, Medical Director. First full day of exercise!  Patient was oriented to gym and equipment including functions, settings, policies, and procedures.  Patient's individual exercise prescription and treatment plan were reviewed.  All starting workloads were established based on the results of the 6 minute walk test done at initial orientation visit.  The plan for exercise progression was also introduced and progression will be customized based on patient's performance and goals. 30 Day review completed. Medical Director ITP review done, changes made as directed, and signed approval by Medical Director. Pt's wife called to let us know that his mother had passed on 02/19/20.  She lived in [place] and they will need to go up there to handle affairs.  He will be out at least the next two weeks.   Row Name 02/29/20 1130 03/13/20 0846 03/26/20 0938 04/10/20 0608 04/10/20 1521   ITP Comments Darrious has not attended sicne last review due to a death in his family. 30 Day review completed. Medical Director ITP review done, changes made as directed, and signed approval by Medical Director. Returns today after a death in his family 30 Day review completed. Medical Director ITP review done, changes made as directed, and signed  approval by Medical Director. Completed initial RD evaluation          Comments:  

## 2020-05-09 ENCOUNTER — Other Ambulatory Visit: Payer: Self-pay

## 2020-05-09 DIAGNOSIS — I2102 ST elevation (STEMI) myocardial infarction involving left anterior descending coronary artery: Secondary | ICD-10-CM | POA: Diagnosis not present

## 2020-05-09 DIAGNOSIS — Z955 Presence of coronary angioplasty implant and graft: Secondary | ICD-10-CM

## 2020-05-09 DIAGNOSIS — I213 ST elevation (STEMI) myocardial infarction of unspecified site: Secondary | ICD-10-CM

## 2020-05-09 NOTE — Progress Notes (Signed)
Daily Session Note  Patient Details  Name: Casey [name]. MRN: [id number] Date of Birth: 1960/10/20 Referring Provider:   Flowsheet Row Cardiac Rehab from 01/30/2020 in [facility]  Referring Provider Loralie Champagne MD      Encounter Date: 05/09/2020  Check In:  Session Check In - 05/09/20 0929      Check-In   Supervising physician immediately available to respond to emergencies See telemetry face sheet for immediately available ER MD    Location [facility]    Staff Present Birdie Sons, MPA, RN;Amanda Oletta Darter, BA, ACSM CEP, Exercise Physiologist    Virtual Visit No    Medication changes reported     No    Fall or balance concerns reported    No    Warm-up and Cool-down Performed on first and last piece of equipment    Resistance Training Performed Yes    VAD Patient? No    PAD/SET Patient? No      Pain Assessment   Currently in Pain? No/denies              Social History   Tobacco Use  Smoking Status Never Smoker  Smokeless Tobacco Never Used    Goals Met:  Independence with exercise equipment Exercise tolerated well No report of cardiac concerns or symptoms Strength training completed today  Goals Unmet:  Not Applicable  Comments: Pt able to follow exercise prescription today without complaint.  Will continue to monitor for progression.    Dr. Emily Filbert is Medical Director for [facility] and [facility].

## 2020-05-14 ENCOUNTER — Other Ambulatory Visit: Payer: Self-pay

## 2020-05-14 DIAGNOSIS — Z955 Presence of coronary angioplasty implant and graft: Secondary | ICD-10-CM

## 2020-05-14 DIAGNOSIS — I2102 ST elevation (STEMI) myocardial infarction involving left anterior descending coronary artery: Secondary | ICD-10-CM | POA: Diagnosis not present

## 2020-05-14 DIAGNOSIS — I213 ST elevation (STEMI) myocardial infarction of unspecified site: Secondary | ICD-10-CM

## 2020-05-14 NOTE — Progress Notes (Signed)
Daily Session Note  Patient Details  Name: Casey [name]. MRN: [id number] Date of Birth: 06-09-1960 Referring Provider:   Flowsheet Row Cardiac Rehab from 01/30/2020 in [facility]  Referring Provider Loralie Champagne MD      Encounter Date: 05/14/2020  Check In:  Session Check In - 05/14/20 0923      Check-In   Supervising physician immediately available to respond to emergencies See telemetry face sheet for immediately available ER MD    Location [facility]    Staff Present Birdie Sons, MPA, RN;Amanda Oletta Darter, BA, ACSM CEP, Exercise Physiologist;Joseph Tessie Fass RCP,RRT,BSRT    Virtual Visit No    Medication changes reported     No    Fall or balance concerns reported    No    Warm-up and Cool-down Performed on first and last piece of equipment    Resistance Training Performed Yes    VAD Patient? No    PAD/SET Patient? No      Pain Assessment   Currently in Pain? No/denies              Social History   Tobacco Use  Smoking Status Never Smoker  Smokeless Tobacco Never Used    Goals Met:  Independence with exercise equipment Exercise tolerated well No report of cardiac concerns or symptoms Strength training completed today  Goals Unmet:  Not Applicable  Comments: Pt able to follow exercise prescription today without complaint.  Will continue to monitor for progression.    Dr. Emily Filbert is Medical Director for [place] [facility] and [facility].

## 2020-05-15 ENCOUNTER — Other Ambulatory Visit: Payer: Self-pay

## 2020-05-15 ENCOUNTER — Ambulatory Visit (HOSPITAL_COMMUNITY)
Admission: RE | Admit: 2020-05-15 | Discharge: 2020-05-15 | Disposition: A | Discharge status: Home / Self Care | Payer: BC Managed Care – PPO | Source: Ambulatory Visit | Attending: Cardiology | Admitting: Cardiology

## 2020-05-15 ENCOUNTER — Encounter (HOSPITAL_COMMUNITY): Payer: Self-pay | Admitting: Cardiology

## 2020-05-15 VITALS — BP 118/70 | HR 68 | Wt 197.8 lb

## 2020-05-15 DIAGNOSIS — I130 Hypertensive heart and chronic kidney disease with heart failure and stage 1 through stage 4 chronic kidney disease, or unspecified chronic kidney disease: Secondary | ICD-10-CM | POA: Insufficient documentation

## 2020-05-15 DIAGNOSIS — Z955 Presence of coronary angioplasty implant and graft: Secondary | ICD-10-CM | POA: Diagnosis not present

## 2020-05-15 DIAGNOSIS — I252 Old myocardial infarction: Secondary | ICD-10-CM | POA: Diagnosis not present

## 2020-05-15 DIAGNOSIS — I4891 Unspecified atrial fibrillation: Secondary | ICD-10-CM | POA: Diagnosis not present

## 2020-05-15 DIAGNOSIS — Z833 Family history of diabetes mellitus: Secondary | ICD-10-CM | POA: Diagnosis not present

## 2020-05-15 DIAGNOSIS — Z7902 Long term (current) use of antithrombotics/antiplatelets: Secondary | ICD-10-CM | POA: Insufficient documentation

## 2020-05-15 DIAGNOSIS — I480 Paroxysmal atrial fibrillation: Secondary | ICD-10-CM | POA: Insufficient documentation

## 2020-05-15 DIAGNOSIS — E1122 Type 2 diabetes mellitus with diabetic chronic kidney disease: Secondary | ICD-10-CM | POA: Diagnosis not present

## 2020-05-15 DIAGNOSIS — N183 Chronic kidney disease, stage 3 unspecified: Secondary | ICD-10-CM | POA: Insufficient documentation

## 2020-05-15 DIAGNOSIS — I513 Intracardiac thrombosis, not elsewhere classified: Secondary | ICD-10-CM | POA: Insufficient documentation

## 2020-05-15 DIAGNOSIS — I5022 Chronic systolic (congestive) heart failure: Secondary | ICD-10-CM | POA: Diagnosis not present

## 2020-05-15 DIAGNOSIS — Z7901 Long term (current) use of anticoagulants: Secondary | ICD-10-CM | POA: Insufficient documentation

## 2020-05-15 DIAGNOSIS — Z79899 Other long term (current) drug therapy: Secondary | ICD-10-CM | POA: Diagnosis not present

## 2020-05-15 DIAGNOSIS — Z7984 Long term (current) use of oral hypoglycemic drugs: Secondary | ICD-10-CM | POA: Diagnosis not present

## 2020-05-15 DIAGNOSIS — Z7952 Long term (current) use of systemic steroids: Secondary | ICD-10-CM | POA: Insufficient documentation

## 2020-05-15 DIAGNOSIS — Z940 Kidney transplant status: Secondary | ICD-10-CM | POA: Insufficient documentation

## 2020-05-15 DIAGNOSIS — I2510 Atherosclerotic heart disease of native coronary artery without angina pectoris: Secondary | ICD-10-CM | POA: Insufficient documentation

## 2020-05-15 LAB — COMPREHENSIVE METABOLIC PANEL
ALT: 35 U/L (ref 0–44)
AST: 25 U/L (ref 15–41)
Albumin: 3.5 g/dL (ref 3.5–5.0)
Alkaline Phosphatase: 40 U/L (ref 38–126)
Anion gap: 6 (ref 5–15)
BUN: 44 mg/dL — ABNORMAL HIGH (ref 6–20)
CO2: 22 mmol/L (ref 22–32)
Calcium: 9.4 mg/dL (ref 8.9–10.3)
Chloride: 112 mmol/L — ABNORMAL HIGH (ref 98–111)
Creatinine, Ser: 3.34 mg/dL — ABNORMAL HIGH (ref 0.61–1.24)
GFR, Estimated: 20 mL/min — ABNORMAL LOW (ref >60)
Glucose, Bld: 174 mg/dL — ABNORMAL HIGH (ref 70–99)
Potassium: 4 mmol/L (ref 3.5–5.1)
Sodium: 140 mmol/L (ref 135–145)
Total Bilirubin: 0.7 mg/dL (ref 0.3–1.2)
Total Protein: 6.1 g/dL — ABNORMAL LOW (ref 6.5–8.1)

## 2020-05-15 LAB — CBC
HCT: 36.8 % — ABNORMAL LOW (ref 39.0–52.0)
Hemoglobin: 11.8 g/dL — ABNORMAL LOW (ref 13.0–17.0)
MCH: 27.3 pg (ref 26.0–34.0)
MCHC: 32.1 g/dL (ref 30.0–36.0)
MCV: 85.2 fL (ref 80.0–100.0)
Platelets: 206 10*3/uL (ref 150–400)
RBC: 4.32 MIL/uL (ref 4.22–5.81)
RDW: 15.8 % — ABNORMAL HIGH (ref 11.5–15.5)
WBC: 8.6 10*3/uL (ref 4.0–10.5)
nRBC: 0 % (ref 0.0–0.2)

## 2020-05-15 LAB — TSH: TSH: 1.341 u[IU]/mL (ref 0.350–4.500)

## 2020-05-15 MED ORDER — EZETIMIBE 10 MG PO TABS: 10.0000 mg | ORAL_TABLET | Freq: Every day | ORAL | 11 refills | Status: DC

## 2020-05-15 MED ORDER — CARVEDILOL 25 MG PO TABS: 25.0000 mg | ORAL_TABLET | Freq: Two times a day (BID) | ORAL | 11 refills | Status: DC

## 2020-05-15 NOTE — Progress Notes (Signed)
PCP: Dion Body, MD Cardiology: Dr. Aundra Dubin  60 y.o. with history of renal transplant in 2011, CAD s/p anterior MI, and ischemic cardiomyopathy presents for followup of CHF and CAD.  Patient had his renal transplant at [facility], and has been followed by [facility] as well as [facility] since that time.  He had been doing well until 10/21.  In early 10/21, he developed chest pain.  He did not go immediately to the ER, he presented when the pain had continued for > 1 day.  He was found to have acute anterior MI with late presentation. He went for cath, LAD was occluded and there was severe diffuse disease in the RCA as well as severe disease in the LCx system.  He had DES to the LAD.  He subsequently developed cardiogenic shock as well as suspected septic shock, possible from gut source.  Abdominal imaging showed profound ileus.  He was taken to the OR for Impella 5.5 placement, but this was deferred due to the finding of LV thrombus on intra-op TEE.  He also developed atrial fibrillation with RVR, controlled by amiodarone and eventually converted back to NSR.  He was maintained on milrinone 0.25 with gradual improvement.  However, he developed AKI, likely due to contrast as well as cardiorenal. Creatinine went up to 5.  Creatinine gradually improved down to 3.38 at discharge, and we were able to discontinue milrinone.  Ileus also gradually resolved.  Echo in the hospital showed EF 20-25%. He initially wore a Lifevest but has now sent it back.   Echo in 1/22 showed EF 30-35%, periapical akinesis, no LV thrombus, normal RV.  He saw EP to discuss ICD and atrial fibrillation ablation, but he was not ready for this yet.   Patient returns for followup.  Still doing cardiac rehab. Symptomatically doing quite well. Can run for a short distance on his treadmill.  No significant exertional dyspnea.  No chest pain.  No orthopnea/PND.  No palpitations.    Labs (10/21): K 4.3, creatinine 3.38 Labs (11/21):  K 4.7, creatinine 2.24, LDL 68, HDL 43, hgb 11.9, LFTs normal Labs (12/21): K 3.9, creatinine 2.43 Labs (2/22): LDL 81, LFTs normal, K 3.7, creatinine 2.49  ECG (personally reviewed): NSR, old inferior MI, old anterior MI  PMH: 1. Type 2 diabetes 2. Atrial fibrillation: Paroxysmal, on amiodarone.  3. Renal transplant 2011 [facility].  4. GERD 5. 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 CAD: Late presentation anterior MI in 10/21.  Cath with occluded LAD, long up to 99% mid-distal RCA stenosis, 95% mid-distal LCx, 80% OM3.  He had DES to LAD.   8. Chronic systolic CHF: Ischemic cardiomyopathy. Cardiogenic shock in 10/21 with MI.  - Echo (10/21): EF 20-25%, heavy smoke/early thrombus LV apex, RV normal.  - RHC (10/21, milrinone 0.25): mean RA 4, PA 22/4, mean PCWP 6, CI 2.06 Fick, CI 2.06 thermo.  - Echo (1/22): EF 30-35%, periapical akinesis, no LV thrombus, normal RV.  9. LV thrombus  SH: Married, nonsmoker, no ETOH.  Lives in [place]. 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   Family History  Problem Relation Age of Onset  . Diabetes Mother   . Hyperlipidemia Mother   . Cancer Father    ROS: all systems reviewed and negative except as per HPI.   Current Outpatient Medications  Medication Sig Dispense Refill  . amiodarone (PACERONE) 200 MG tablet Take 1 tablet (200 mg total) by mouth daily. 30 tablet 6  . clopidogrel (PLAVIX)  75 MG tablet Take 1 tablet (75 mg total) by mouth daily. 30 tablet 6  . dapagliflozin propanediol (FARXIGA) 10 MG TABS tablet Take 1 tablet (10 mg total) by mouth daily before breakfast. 90 tablet 3  . ezetimibe (ZETIA) 10 MG tablet Take 1 tablet (10 mg total) by mouth daily. 30 tablet 11  . glipiZIDE (GLUCOTROL) 10 MG tablet Take 10 mg by mouth 2 (two) times daily before a meal.    . isosorbide-hydrALAZINE (BIDIL) 20-37.5 MG tablet Take 2 tablets by mouth 3 (three) times daily. 180 tablet 6  . loratadine (CLARITIN) 10 MG tablet Take 10 mg by mouth daily as needed for  allergies.    . nitroGLYCERIN (NITROSTAT) 0.4 MG SL tablet Place 1 tablet (0.4 mg total) under the tongue every 5 (five) minutes as needed for chest pain. 100 tablet 3  . pantoprazole (PROTONIX) 40 MG tablet TAKE 1 TABLET BY MOUTH EVERY DAY 90 tablet 1  . predniSONE (DELTASONE) 5 MG tablet Take 1 tablet (5 mg total) by mouth daily with breakfast.    . rosuvastatin (CRESTOR) 40 MG tablet Take 1 tablet (40 mg total) by mouth daily. 30 tablet 6  . simethicone (MYLICON) 0000000 MG chewable tablet Chew 125 mg by mouth every 6 (six) hours as needed for flatulence.    . tacrolimus (PROGRAF) 1 MG capsule Take 4 mg by mouth 2 (two) times daily.    Marland Kitchen torsemide (DEMADEX) 20 MG tablet Take 1 tablet (20 mg total) by mouth as needed. 3 pound weight gain in 24 hours 30 tablet 6  . warfarin (COUMADIN) 1 MG tablet TAKE 3 TO 4 TABLETS DAILY AS DIRECTED BY ANTICOAGULATION CLINIC 110 tablet 1  . carvedilol (COREG) 25 MG tablet Take 1 tablet (25 mg total) by mouth 2 (two) times daily. 60 tablet 11   No current facility-administered medications for this encounter.   BP 118/70   Pulse 68   Wt 89.7 kg (197 lb 12.8 oz)   SpO2 97%   BMI 26.10 kg/m  General: NAD Neck: No JVD, no thyromegaly or thyroid nodule.  Lungs: Clear to auscultation bilaterally with normal respiratory effort. CV: Nondisplaced PMI.  Heart regular S1/S2, no S3/S4, no murmur.  No peripheral edema.  No carotid bruit.  Normal pedal pulses.  Abdomen: Soft, nontender, no hepatosplenomegaly, no distention.  Skin: Intact without lesions or rashes.  Neurologic: Alert and oriented x 3.  Psych: Normal affect. Extremities: No clubbing or cyanosis.  HEENT: Normal.   Assessment/Plan: 1. CAD: S/p late presentation anterior MI in 10/21 with DES to LAD.  He has residual severe disease in the RCA which is not revascularizable.   He has 95% mid-distal LCx stenosis and 80% OM3 stenosis that could potentially be intervened upon.  No chest pain.  - Continue cardiac  rehab.   - Continue Plavix for up to 1 year post-PCI.  He is now off ASA as he is also on warfarin.  Can stop Plavix at 6 months if he develops issues with bleeding. - Continue statin, good lipids in 11/21.  - If creatinine decreases back to baseline around 1, will arrange for PCI to LCx system.  However, no chest pain and creatinine remains > 2, so will continue medical management for now.   2. Atrial fibrillation: Paroxysmal.  He is in NSR today.  - Continue amiodarone 200 mg daily.  Check LFTs and TSH today, will need regular eye exam.  - Continue warfarin. CBC today.  - Recommended atrial  fibrillation ablation so he can safely stop amiodarone. Dr. Curt Bears has seen him and offered ablation.  He is thinking about this and will re-discuss when he goes back to EP.  3. Chronic systolic CHF: Ischemic cardiomyopathy.  Echo in 10/21 with EF 20-25%.  Echo in 1/22 showed EF 30-35% with regional wall motion abnormalities.  Not volume overloaded on exam, NYHA class II.   - He can continue to use torsemide prn.  - Continue Bidil 2 tabs tid.  - Increase Coreg to 25 mg bid.  - Check BMET today, if creatinine stable will be able to start spironolactone 12.5 mg daily as next step.  - Continue Farxiga 10 mg daily.   - EF persistently < 35%.  Not CRT candidate with narrow QRS. He has seen EP, ICD offered (significant post-MI scar).  However, he was not ready yet for ICD.  Plan is to repeat echo in 5/22, and if EF remains low, he seems willing to get ICD.   4. LV thrombus: No thrombus on 1/22 echo.   - Now on warfarin.  5. 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  6. Type 2 diabetes: On Farxiga.   Echo scheduled for 5/22, see Dr. Curt Bears afterwards if EF remains low.  Followup APP 1 month for med titration.   Loralie Champagne 05/15/2020

## 2020-05-15 NOTE — Addendum Note (Signed)
Encounter addended by: Shonna Chock, CMA on: [id number] 10:35 AM  Actions taken: Order list changed, Diagnosis association updated

## 2020-05-15 NOTE — Addendum Note (Signed)
Encounter addended by: Larey Dresser, MD on: 05/15/2020 1:57 PM  Actions taken: Clinical Note Signed, Level of Service modified

## 2020-05-15 NOTE — Patient Instructions (Addendum)
EKG done today.  Labs done today. We will contact you only if your labs are abnormal.  START Zetia '10mg'$  (1 tablet) by mouth daily.   INCREASE Carvedilol (Coreg) to '25mg'$  (1 tablet) by mouth 2 times daily.  No other medication changes were made. Please continue all current medications as prescribed.  Your physician recommends that you schedule a follow-up appointment in: 1 month with our PA/NP Clinic here in our office and in 2 months for a lab only appointment.   If you have any questions or concerns before your next appointment please send [place] a message through [place] or call our office at [phone].    TO LEAVE A MESSAGE FOR THE NURSE SELECT OPTION 2, PLEASE LEAVE A MESSAGE INCLUDING: . YOUR NAME . DATE OF BIRTH . CALL BACK NUMBER . REASON FOR CALL**this is important as we prioritize the call backs  YOU WILL RECEIVE A CALL BACK THE SAME DAY AS LONG AS YOU CALL BEFORE 4:00 PM   Do the following things EVERYDAY: 1) Weigh yourself in the morning before breakfast. Write it down and keep it in a log. 2) Take your medicines as prescribed 3) Eat low salt foods--Limit salt (sodium) to 2000 mg per day.  4) Stay as active as you can everyday 5) Limit all fluids for the day to less than 2 liters   At the [facility], you and your health needs are our priority. As part of our continuing mission to provide you with exceptional heart care, we have created designated Provider Care Teams. These Care Teams include your primary Cardiologist (physician) and Advanced Practice Providers (APPs- Physician Assistants and Nurse Practitioners) who all work together to provide you with the care you need, when you need it.   You may see any of the following providers on your designated Care Team at your next follow up: Marland Kitchen Dr Glori Bickers . Dr Loralie Champagne . Darrick Grinder, NP . Lyda Jester, PA . Audry Riles, PharmD   Please be sure to bring in all your medications bottles to  every appointment.

## 2020-05-16 DIAGNOSIS — I213 ST elevation (STEMI) myocardial infarction of unspecified site: Secondary | ICD-10-CM

## 2020-05-16 DIAGNOSIS — I2102 ST elevation (STEMI) myocardial infarction involving left anterior descending coronary artery: Secondary | ICD-10-CM | POA: Diagnosis not present

## 2020-05-16 DIAGNOSIS — Z955 Presence of coronary angioplasty implant and graft: Secondary | ICD-10-CM

## 2020-05-16 NOTE — Progress Notes (Signed)
Daily Session Note  Patient Details  Name: Casey [name]. MRN: [id number] Date of Birth: 1960-10-01 Referring Provider:   Flowsheet Row Cardiac Rehab from 01/30/2020 in [facility] Cardiac and Pulmonary Rehab  Referring Provider Loralie Champagne MD      Encounter Date: 05/16/2020  Check In:  Session Check In - 05/16/20 0926      Check-In   Supervising physician immediately available to respond to emergencies See telemetry face sheet for immediately available ER MD    Location [facility]-Cardiac & Pulmonary Rehab    Staff Present Birdie Sons, MPA, Mauricia Area, BS, ACSM CEP, Exercise Physiologist;Amanda Oletta Darter, BA, ACSM CEP, Exercise Physiologist;Colleen Mel Almond, RN, BSN    Virtual Visit No    Medication changes reported     No    Fall or balance concerns reported    No    Warm-up and Cool-down Performed on first and last piece of equipment    Resistance Training Performed Yes    VAD Patient? No    PAD/SET Patient? No      Pain Assessment   Currently in Pain? No/denies              Social History   Tobacco Use  Smoking Status Never Smoker  Smokeless Tobacco Never Used    Goals Met:  Independence with exercise equipment Exercise tolerated well No report of cardiac concerns or symptoms Strength training completed today  Goals Unmet:  Not Applicable  Comments: Pt able to follow exercise prescription today without complaint.  Will continue to monitor for progression.    Dr. Emily Filbert is Medical Director for [place] and [facility].

## 2020-05-17 ENCOUNTER — Other Ambulatory Visit (HOSPITAL_COMMUNITY): Payer: Self-pay | Admitting: Surgery

## 2020-05-17 ENCOUNTER — Telehealth (HOSPITAL_COMMUNITY): Payer: Self-pay | Admitting: Surgery

## 2020-05-17 DIAGNOSIS — I5022 Chronic systolic (congestive) heart failure: Secondary | ICD-10-CM

## 2020-05-17 NOTE — Telephone Encounter (Signed)
Results reviewed with patient and labwork scheduled for next week per Dr. Aundra Dubin.

## 2020-05-21 ENCOUNTER — Other Ambulatory Visit: Payer: Self-pay

## 2020-05-21 ENCOUNTER — Encounter: Payer: BC Managed Care – PPO | Attending: Cardiology

## 2020-05-21 ENCOUNTER — Other Ambulatory Visit: Payer: Self-pay | Admitting: Cardiology

## 2020-05-21 DIAGNOSIS — I213 ST elevation (STEMI) myocardial infarction of unspecified site: Secondary | ICD-10-CM | POA: Diagnosis present

## 2020-05-21 DIAGNOSIS — Z955 Presence of coronary angioplasty implant and graft: Secondary | ICD-10-CM

## 2020-05-21 NOTE — Progress Notes (Signed)
Daily Session Note  Patient Details  Name: Casey [name]. MRN: [id number] Date of Birth: 11-14-1960 Referring Provider:   Flowsheet Row Cardiac Rehab from 01/30/2020 in [facility] Cardiac and Pulmonary Rehab  Referring Provider Loralie Champagne MD      Encounter Date: 05/21/2020  Check In:  Session Check In - 05/21/20 0926      Check-In   Supervising physician immediately available to respond to emergencies See telemetry face sheet for immediately available ER MD    Location [facility]-Cardiac & Pulmonary Rehab    Staff Present Birdie Sons, MPA, RN;Amanda Oletta Darter, BA, ACSM CEP, Exercise Physiologist;Kara Eliezer Bottom, MS Exercise Physiologist    Virtual Visit No    Medication changes reported     No    Fall or balance concerns reported    No    Warm-up and Cool-down Performed on first and last piece of equipment    Resistance Training Performed Yes    VAD Patient? No    PAD/SET Patient? No      Pain Assessment   Currently in Pain? No/denies              Social History   Tobacco Use  Smoking Status Never Smoker  Smokeless Tobacco Never Used    Goals Met:  Independence with exercise equipment Exercise tolerated well No report of cardiac concerns or symptoms Strength training completed today  Goals Unmet:  Not Applicable  Comments: Pt able to follow exercise prescription today without complaint.  Will continue to monitor for progression.    Dr. Emily Filbert is Medical Director for [place] and [facility].

## 2020-05-22 ENCOUNTER — Ambulatory Visit (INDEPENDENT_AMBULATORY_CARE_PROVIDER_SITE_OTHER): Payer: BC Managed Care – PPO

## 2020-05-22 ENCOUNTER — Telehealth (HOSPITAL_COMMUNITY): Payer: Self-pay | Admitting: *Deleted

## 2020-05-22 ENCOUNTER — Other Ambulatory Visit: Payer: Self-pay

## 2020-05-22 DIAGNOSIS — I2102 ST elevation (STEMI) myocardial infarction involving left anterior descending coronary artery: Secondary | ICD-10-CM | POA: Diagnosis not present

## 2020-05-22 DIAGNOSIS — I4891 Unspecified atrial fibrillation: Secondary | ICD-10-CM

## 2020-05-22 DIAGNOSIS — I236 Thrombosis of atrium, auricular appendage, and ventricle as current complications following acute myocardial infarction: Secondary | ICD-10-CM

## 2020-05-22 DIAGNOSIS — Z7901 Long term (current) use of anticoagulants: Secondary | ICD-10-CM

## 2020-05-22 LAB — POCT INR: INR: 3 (ref 2.0–3.0)

## 2020-05-22 NOTE — Patient Instructions (Signed)
-   continue dosage of warfarin 3 tablets every day EXCEPT 2 TABLETS ON MONDAYS & WEDNESDAYS. - Recheck INR in 4 weeks  Coumadin Clinic [facility] [phone].

## 2020-05-22 NOTE — Telephone Encounter (Signed)
Received fax from [facility], pt needs clearnce for dental extractions with bone grafting  Per Dr Aundra Dubin: "He is on Plavix and warfarin, >6 months post-stent so can hold Plavix 5 days prior to procedure and restart after. With h/o LV thrombus continue warfarin, can aim for INR abour 2 pre procedure"  Note faxed back to them at [phone]

## 2020-05-23 ENCOUNTER — Ambulatory Visit (HOSPITAL_COMMUNITY)
Admission: RE | Admit: 2020-05-23 | Discharge: 2020-05-23 | Disposition: A | Discharge status: Home / Self Care | Payer: BC Managed Care – PPO | Source: Ambulatory Visit | Attending: Internal Medicine | Admitting: Internal Medicine

## 2020-05-23 DIAGNOSIS — I213 ST elevation (STEMI) myocardial infarction of unspecified site: Secondary | ICD-10-CM

## 2020-05-23 DIAGNOSIS — Z955 Presence of coronary angioplasty implant and graft: Secondary | ICD-10-CM | POA: Diagnosis not present

## 2020-05-23 DIAGNOSIS — I5022 Chronic systolic (congestive) heart failure: Secondary | ICD-10-CM | POA: Diagnosis not present

## 2020-05-23 LAB — HEPATIC FUNCTION PANEL
ALT: 40 U/L (ref 0–44)
AST: 26 U/L (ref 15–41)
Albumin: 3.6 g/dL (ref 3.5–5.0)
Alkaline Phosphatase: 41 U/L (ref 38–126)
Bilirubin, Direct: <0.1 mg/dL (ref 0.0–0.2)
Total Bilirubin: 0.7 mg/dL (ref 0.3–1.2)
Total Protein: 6.3 g/dL — ABNORMAL LOW (ref 6.5–8.1)

## 2020-05-23 LAB — LIPID PANEL
Cholesterol: 112 mg/dL (ref 0–200)
HDL: 48 mg/dL (ref >40)
LDL Cholesterol: 36 mg/dL (ref 0–99)
Total CHOL/HDL Ratio: 2.3 RATIO
Triglycerides: 141 mg/dL (ref <150)
VLDL: 28 mg/dL (ref 0–40)

## 2020-05-23 NOTE — Progress Notes (Signed)
Daily Session Note  Patient Details  Name: Casey [name]. MRN: [id number] Date of Birth: 26-Nov-1960 Referring Provider:   Flowsheet Row Cardiac Rehab from 01/30/2020 in [facility] Cardiac and Pulmonary Rehab  Referring Provider Loralie Champagne MD      Encounter Date: 05/23/2020  Check In:  Session Check In - 05/23/20 0921      Check-In   Supervising physician immediately available to respond to emergencies See telemetry face sheet for immediately available ER MD    Location [facility]-Cardiac & Pulmonary Rehab    Staff Present Birdie Sons, MPA, Mauricia Area, BS, ACSM CEP, Exercise Physiologist;Amanda Oletta Darter, BA, ACSM CEP, Exercise Physiologist    Virtual Visit No    Medication changes reported     No    Fall or balance concerns reported    No    Warm-up and Cool-down Performed on first and last piece of equipment    Resistance Training Performed Yes    VAD Patient? No    PAD/SET Patient? No      Pain Assessment   Currently in Pain? No/denies              Social History   Tobacco Use  Smoking Status Never Smoker  Smokeless Tobacco Never Used    Goals Met:  Independence with exercise equipment Exercise tolerated well No report of cardiac concerns or symptoms Strength training completed today  Goals Unmet:  Not Applicable  Comments: Pt able to follow exercise prescription today without complaint.  Will continue to monitor for progression.    Dr. Emily Filbert is Medical Director for [place] and [facility].

## 2020-05-28 ENCOUNTER — Other Ambulatory Visit: Payer: Self-pay

## 2020-05-28 DIAGNOSIS — Z955 Presence of coronary angioplasty implant and graft: Secondary | ICD-10-CM | POA: Diagnosis not present

## 2020-05-28 DIAGNOSIS — I213 ST elevation (STEMI) myocardial infarction of unspecified site: Secondary | ICD-10-CM

## 2020-05-28 NOTE — Progress Notes (Signed)
Daily Session Note  Patient Details  Name: Casey [name]. MRN: [id number] Date of Birth: [birth year] Referring Provider:   Flowsheet Row Cardiac Rehab from 01/30/2020 in [facility] Cardiac and Pulmonary Rehab  Referring Provider Loralie Champagne MD      Encounter Date: 05/28/2020  Check In:  Session Check In - 05/28/20 0920      Check-In   Supervising physician immediately available to respond to emergencies See telemetry face sheet for immediately available ER MD    Location [facility]-Cardiac & Pulmonary Rehab    Staff Present Birdie Sons, MPA, RN;Amanda Oletta Darter, BA, ACSM CEP, Exercise Physiologist;Kara Eliezer Bottom, MS Exercise Physiologist    Virtual Visit No    Medication changes reported     No    Fall or balance concerns reported    No    Warm-up and Cool-down Performed on first and last piece of equipment    Resistance Training Performed Yes    VAD Patient? No    PAD/SET Patient? No      Pain Assessment   Currently in Pain? No/denies              Social History   Tobacco Use  Smoking Status Never Smoker  Smokeless Tobacco Never Used    Goals Met:  Independence with exercise equipment Exercise tolerated well No report of cardiac concerns or symptoms Strength training completed today  Goals Unmet:  Not Applicable  Comments: Pt able to follow exercise prescription today without complaint.  Will continue to monitor for progression.    Dr. Emily Filbert is Medical Director for [place] and [facility].

## 2020-05-30 ENCOUNTER — Other Ambulatory Visit: Payer: Self-pay

## 2020-05-30 DIAGNOSIS — Z955 Presence of coronary angioplasty implant and graft: Secondary | ICD-10-CM

## 2020-05-30 DIAGNOSIS — I213 ST elevation (STEMI) myocardial infarction of unspecified site: Secondary | ICD-10-CM

## 2020-05-30 NOTE — Progress Notes (Signed)
Daily Session Note  Patient Details  Name: Casey [name]. MRN: [id number] Date of Birth: 11-28-1960 Referring Provider:   Flowsheet Row Cardiac Rehab from 01/30/2020 in [facility] Cardiac and Pulmonary Rehab  Referring Provider Loralie Champagne MD      Encounter Date: 05/30/2020  Check In:  Session Check In - 05/30/20 0928      Check-In   Supervising physician immediately available to respond to emergencies See telemetry face sheet for immediately available ER MD    Location [facility]-Cardiac & Pulmonary Rehab    Staff Present Birdie Sons, MPA, RN;Amanda Oletta Darter, BA, ACSM CEP, Exercise Physiologist;Shere Eisenhart Amedeo Plenty, BS, ACSM CEP, Exercise Physiologist    Virtual Visit No    Medication changes reported     No    Fall or balance concerns reported    No    Warm-up and Cool-down Performed on first and last piece of equipment    Resistance Training Performed Yes    VAD Patient? No    PAD/SET Patient? No      Pain Assessment   Currently in Pain? No/denies              Social History   Tobacco Use  Smoking Status Never Smoker  Smokeless Tobacco Never Used    Goals Met:  Independence with exercise equipment Exercise tolerated well No report of cardiac concerns or symptoms Strength training completed today  Goals Unmet:  Not Applicable  Comments: Pt able to follow exercise prescription today without complaint.  Will continue to monitor for progression.    Dr. Emily Filbert is Medical Director for [place] and [facility].

## 2020-06-04 ENCOUNTER — Encounter: Payer: BC Managed Care – PPO | Admitting: *Deleted

## 2020-06-04 ENCOUNTER — Other Ambulatory Visit: Payer: Self-pay

## 2020-06-04 DIAGNOSIS — I213 ST elevation (STEMI) myocardial infarction of unspecified site: Secondary | ICD-10-CM

## 2020-06-04 DIAGNOSIS — Z955 Presence of coronary angioplasty implant and graft: Secondary | ICD-10-CM | POA: Diagnosis not present

## 2020-06-04 NOTE — Progress Notes (Signed)
Daily Session Note  Patient Details  Name: Casey [name]. MRN: [id number] Date of Birth: 05/08/1960 Referring Provider:   Flowsheet Row Cardiac Rehab from 01/30/2020 in [facility] Cardiac and Pulmonary Rehab  Referring Provider Loralie Champagne MD      Encounter Date: 06/04/2020  Check In:  Session Check In - 06/04/20 1005      Check-In   Supervising physician immediately available to respond to emergencies See telemetry face sheet for immediately available ER MD    Location [facility]-Cardiac & Pulmonary Rehab    Staff Present Heath Lark, RN, BSN, CCRP;Melissa Caiola RDN, LDN;Laureen Owens Shark, BS, RRT, CPFT    Virtual Visit No    Medication changes reported     No    Fall or balance concerns reported    Yes    Warm-up and Cool-down Performed on first and last piece of equipment    Resistance Training Performed Yes    VAD Patient? No    PAD/SET Patient? No      Pain Assessment   Currently in Pain? No/denies              Social History   Tobacco Use  Smoking Status Never Smoker  Smokeless Tobacco Never Used    Goals Met:  Independence with exercise equipment Exercise tolerated well No report of cardiac concerns or symptoms  Goals Unmet:  Not Applicable  Comments: Pt able to follow exercise prescription today without complaint.  Will continue to monitor for progression.    Dr. Emily Filbert is Medical Director for [place] and [facility].

## 2020-06-05 ENCOUNTER — Encounter: Payer: Self-pay | Admitting: *Deleted

## 2020-06-05 DIAGNOSIS — Z955 Presence of coronary angioplasty implant and graft: Secondary | ICD-10-CM

## 2020-06-05 DIAGNOSIS — I213 ST elevation (STEMI) myocardial infarction of unspecified site: Secondary | ICD-10-CM

## 2020-06-05 NOTE — Progress Notes (Signed)
Cardiac Individual Treatment Plan  Patient Details  Name: Casey [name]. MRN: [id number] Date of Birth: [birth year] Referring Provider:   Flowsheet Row Cardiac Rehab from 01/30/2020 in [facility] Cardiac and Pulmonary Rehab  Referring Provider Loralie Champagne MD      Initial Encounter Date:  Flowsheet Row Cardiac Rehab from 01/30/2020 in [facility] Cardiac and Pulmonary Rehab  Date 01/30/20      Visit Diagnosis: Status post coronary artery stent placement  ST elevation myocardial infarction (STEMI), unspecified artery ([place])  Patient's Home Medications on Admission:  Current Outpatient Medications:  .  amiodarone (PACERONE) 200 MG tablet, Take 1 tablet (200 mg total) by mouth daily., Disp: 30 tablet, Rfl: 6 .  carvedilol (COREG) 25 MG tablet, Take 1 tablet (25 mg total) by mouth 2 (two) times daily., Disp: 60 tablet, Rfl: 11 .  clopidogrel (PLAVIX) 75 MG tablet, Take 1 tablet (75 mg total) by mouth daily., Disp: 30 tablet, Rfl: 6 .  dapagliflozin propanediol (FARXIGA) 10 MG TABS tablet, Take 1 tablet (10 mg total) by mouth daily before breakfast., Disp: 90 tablet, Rfl: 3 .  ezetimibe (ZETIA) 10 MG tablet, Take 1 tablet (10 mg total) by mouth daily., Disp: 30 tablet, Rfl: 11 .  glipiZIDE (GLUCOTROL) 10 MG tablet, Take 10 mg by mouth 2 (two) times daily before a meal., Disp: , Rfl:  .  isosorbide-hydrALAZINE (BIDIL) 20-37.5 MG tablet, Take 2 tablets by mouth 3 (three) times daily., Disp: 180 tablet, Rfl: 6 .  loratadine (CLARITIN) 10 MG tablet, Take 10 mg by mouth daily as needed for allergies., Disp: , Rfl:  .  nitroGLYCERIN (NITROSTAT) 0.4 MG SL tablet, Place 1 tablet (0.4 mg total) under the tongue every 5 (five) minutes as needed for chest pain., Disp: 100 tablet, Rfl: 3 .  pantoprazole (PROTONIX) 40 MG tablet, TAKE 1 TABLET BY MOUTH EVERY DAY, Disp: 90 tablet, Rfl: 1 .  predniSONE (DELTASONE) 5 MG tablet, Take 1 tablet (5 mg total) by mouth daily with breakfast., Disp: , Rfl:  .   rosuvastatin (CRESTOR) 40 MG tablet, Take 1 tablet (40 mg total) by mouth daily., Disp: 30 tablet, Rfl: 6 .  simethicone (MYLICON) 222 MG chewable tablet, Chew 125 mg by mouth every 6 (six) hours as needed for flatulence., Disp: , Rfl:  .  tacrolimus (PROGRAF) 1 MG capsule, Take 4 mg by mouth 2 (two) times daily., Disp: , Rfl:  .  warfarin (COUMADIN) 1 MG tablet, TAKE 2 TO 3 TABLETS BY MOUTH AS DIRECTED BY ANTICOAGULATION CLINIC, Disp: 90 tablet, Rfl: 2  Past Medical History: Past Medical History:  Diagnosis Date  . CHF (congestive heart failure) ([place])   . Chronic kidney disease 04/2009   Kidney Transplant  . Diabetes mellitus   . GERD (gastroesophageal reflux disease)    as needed reflux  . Heart attack ([place]) 02/02/2020  . Hypertension   . 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    Tobacco Use: Social History   Tobacco Use  Smoking Status Never Smoker  Smokeless Tobacco Never Used    Labs: Recent Review Flowsheet Data    Labs for ITP Cardiac and Pulmonary Rehab Latest Ref Rng & Units 12/13/2019 12/14/2019 12/15/2019 12/26/2019 05/23/2020   Cholestrol 0 - 200 mg/dL - - - 147 112   LDLCALC 0 - 99 mg/dL - - - 68 36   HDL >40 mg/dL - - - 43 48   Trlycerides <150 mg/dL - - - 181(H)  141   Hemoglobin A1c 4.8 - 5.6 % - - - - -   PHART 7.350 - 7.450 - - - - -   PCO2ART 32.0 - 48.0 mmHg - - - - -   HCO3 20.0 - 28.0 mmol/L - - - - -   TCO2 22 - 32 mmol/L - - - - -   ACIDBASEDEF 0.0 - 2.0 mmol/L - - - - -   O2SAT % 70.7 68.9 72.2 - -       Exercise Target Goals: Exercise Program Goal: Individual exercise prescription set using results from initial 6 min walk test and THRR while considering  patient's activity barriers and safety.   Exercise Prescription Goal: Initial exercise prescription builds to 30-45 minutes a day of aerobic activity, 2-3 days per week.  Home exercise guidelines will be given to patient during program as part of exercise prescription  that the participant will acknowledge.   Education: Aerobic Exercise: - Group verbal and visual presentation on the components of exercise prescription. Introduces F.I.T.T principle from ACSM for exercise prescriptions.  Reviews F.I.T.T. principles of aerobic exercise including progression. Written material given at graduation. Flowsheet Row Cardiac Rehab from 05/09/2020 in [facility] Cardiac and Pulmonary Rehab  Education need identified 01/30/20      Education: Resistance Exercise: - Group verbal and visual presentation on the components of exercise prescription. Introduces F.I.T.T principle from ACSM for exercise prescriptions  Reviews F.I.T.T. principles of resistance exercise including progression. Written material given at graduation. Flowsheet Row Cardiac Rehab from 05/09/2020 in [facility] Cardiac and Pulmonary Rehab  Date 02/08/20  Educator [facility]  Instruction Review Code 1- [employer] Understanding       Education: Exercise & Equipment Safety: - Individual verbal instruction and demonstration of equipment use and safety with use of the equipment. Flowsheet Row Cardiac Rehab from 05/09/2020 in [facility] Cardiac and Pulmonary Rehab  Date 01/30/20  Educator [facility]  Instruction Review Code 1- Verbalizes Understanding      Education: Exercise Physiology & General Exercise Guidelines: - Group verbal and written instruction with models to review the exercise physiology of the cardiovascular system and associated critical values. Provides general exercise guidelines with specific guidelines to those with heart or lung disease.  Flowsheet Row Cardiac Rehab from 05/09/2020 in [facility] Cardiac and Pulmonary Rehab  Date 03/28/20  Educator [facility]  Instruction Review Code 1- Verbalizes Understanding      Education: Flexibility, Balance, Mind/Body Relaxation: - Group verbal and visual presentation with interactive activity on the components of exercise prescription. Introduces F.I.T.T principle from ACSM for exercise  prescriptions. Reviews F.I.T.T. principles of flexibility and balance exercise training including progression. Also discusses the mind body connection.  Reviews various relaxation techniques to help reduce and manage stress (i.e. Deep breathing, progressive muscle relaxation, and visualization). Balance handout provided to take home. Written material given at graduation. Flowsheet Row Cardiac Rehab from 05/09/2020 in [facility] Cardiac and Pulmonary Rehab  Date 04/18/20  Educator [facility]  Instruction Review Code 1- Verbalizes Understanding      Activity Barriers & Risk Stratification:  Activity Barriers & Cardiac Risk Stratification - 01/30/20 1527      Activity Barriers & Cardiac Risk Stratification   Activity Barriers Other (comment);Deconditioning    Comments Life Vest; Renal Transplant, L shoulder tingly    Cardiac Risk Stratification High           6 Minute Walk:  6 Minute Walk    Row Name 01/30/20 1526  6 Minute Walk   Phase Initial     Distance 1230 feet     Walk Time 6 minutes     # of Rest Breaks 0     MPH 2.33     METS 3.71     RPE 9     VO2 Peak 12.97     Symptoms No     Resting HR 75 bpm     Resting BP 128/70     Resting Oxygen Saturation  97 %     Exercise Oxygen Saturation  during 6 min walk 96 %     Max Ex. HR 101 bpm     Max Ex. BP 146/64     2 Minute Post BP 126/64            Oxygen Initial Assessment:   Oxygen Re-Evaluation:   Oxygen Discharge (Final Oxygen Re-Evaluation):   Initial Exercise Prescription:  Initial Exercise Prescription - 01/30/20 1500      Date of Initial Exercise RX and Referring Provider   Date 01/30/20    Referring Provider Loralie Champagne MD      Treadmill   MPH 2.3    Grade 1    Minutes 15    METs 3.08      NuStep   Level 4    SPM 80    Minutes 15    METs 3      Elliptical   Level 1    Speed 3.5    Minutes 15    METs 3      Biostep-RELP   Level 4    SPM 50    Minutes 15    METs 3       Prescription Details   Frequency (times per week) 2    Duration Progress to 30 minutes of continuous aerobic without signs/symptoms of physical distress      Intensity   THRR 40-80% of Max Heartrate 109-144    Ratings of Perceived Exertion 11-13    Perceived Dyspnea 0-4      Progression   Progression Continue to progress workloads to maintain intensity without signs/symptoms of physical distress.      Resistance Training   Training Prescription Yes    Weight 4 lb    Reps 10-15           Perform Capillary Blood Glucose checks as needed.  Exercise Prescription Changes:  Exercise Prescription Changes    Row Name 01/30/20 1500 02/13/20 1200 03/07/20 0900 03/13/20 1400 03/27/20 1500     Response to Exercise   Blood Pressure (Admit) 128/70 142/76 -- 108/62 110/62   Blood Pressure (Exercise) 146/64 152/64 -- 114/66 118/62   Blood Pressure (Exit) 126/64 118/60 -- 110/60 112/64   Heart Rate (Admit) 75 bpm 73 bpm -- 69 bpm 83 bpm   Heart Rate (Exercise) 101 bpm 92 bpm -- 90 bpm 98 bpm   Heart Rate (Exit) 76 bpm 76 bpm -- 75 bpm 71 bpm   Oxygen Saturation (Admit) 97 % -- -- -- --   Oxygen Saturation (Exercise) 96 % -- -- -- --   Rating of Perceived Exertion (Exercise) 9 12 -- 12 11   Symptoms none none -- none none   Comments walk test results -- -- -- --   Duration -- -- -- Continue with 30 min of aerobic exercise without signs/symptoms of physical distress. Continue with 30 min of aerobic exercise without signs/symptoms of physical distress.   Intensity -- -- -- THRR  unchanged THRR unchanged     Progression   Progression -- -- -- Continue to progress workloads to maintain intensity without signs/symptoms of physical distress. Continue to progress workloads to maintain intensity without signs/symptoms of physical distress.   Average METs -- -- -- 2.7 3.55     Resistance Training   Training Prescription -- Yes -- Yes Yes   Weight -- 4 lb -- 4 lb 4 lb   Reps -- 10-15 -- 10-15  10-15     Interval Training   Interval Training -- -- -- No No     Treadmill   MPH -- 2.3 -- 2.4 --   Grade -- 1 -- 0.5 --   Minutes -- 15 -- 15 --   METs -- 3.08 -- 3 --     NuStep   Level -- 4 -- -- 4   SPM -- 80 -- -- 80   Minutes -- 15 -- -- 15   METs -- 2.1 -- -- 4     T5 Nustep   Level -- -- -- 4 --   Minutes -- -- -- 15 --   METs -- -- -- 2.4 --     Biostep-RELP   Level -- -- -- -- 4   SPM -- -- -- -- 50   Minutes -- -- -- -- 15   METs -- -- -- -- 4     Home Exercise Plan   Plans to continue exercise at -- -- Home (comment)  walking, weights, staff videos Home (comment)  walking, weights, staff videos Home (comment)  walking, weights, staff videos   Frequency -- -- Add 2 additional days to program exercise sessions. Add 2 additional days to program exercise sessions. Add 2 additional days to program exercise sessions.   Initial Home Exercises Provided -- -- 03/07/20 03/07/20 03/07/20   Row Name 04/10/20 0700 04/23/20 0800 05/06/20 1500 05/22/20 1200       Response to Exercise   Blood Pressure (Admit) 108/60 114/54 102/64 102/62    Blood Pressure (Exercise) 112/76 120/60 122/60 122/58    Blood Pressure (Exit) 108/52 124/64 104/54 102/58    Heart Rate (Admit) 64 bpm 82 bpm 73 bpm 58 bpm    Heart Rate (Exercise) 94 bpm 88 bpm 83 bpm 84 bpm    Heart Rate (Exit) 71 bpm 65 bpm 66 bpm 63 bpm    Rating of Perceived Exertion (Exercise) _0 Symptoms none none none none    Duration Continue with 30 min of aerobic exercise without signs/symptoms of physical distress. Continue with 30 min of aerobic exercise without signs/symptoms of physical distress. Continue with 30 min of aerobic exercise without signs/symptoms of physical distress. Continue with 30 min of aerobic exercise without signs/symptoms of physical distress.    Intensity THRR unchanged THRR unchanged THRR unchanged THRR unchanged         Progression   Progression Continue to progress workloads to  maintain intensity without signs/symptoms of physical distress. Continue to progress workloads to maintain intensity without signs/symptoms of physical distress. Continue to progress workloads to maintain intensity without signs/symptoms of physical distress. Continue to progress workloads to maintain intensity without signs/symptoms of physical distress.    Average METs 2.85 2.95 2.83 2.95         Resistance Training   Training Prescription Yes Yes Yes Yes    Weight 4 lb 5 lb 5 lb 5 lb    Reps 10-15 10-15 10-15 10-15  Interval Training   Interval Training No -- No --         Treadmill   MPH 3 -- 2.3 3    Grade 0.5 -- 1 1    Minutes 15 -- 15 15    METs 3.5 -- 3.08 3.71         NuStep   Level 4 -- -- --    Minutes 15 -- -- --    METs 2.6 -- -- --         Elliptical   Level 1 -- 2 1    Speed 3.5 -- 3.5 3.5    Minutes 15 -- 15 15    METs 2.2 -- 2.4 2.2         REL-XR   Level -- -- 2 --    Minutes -- -- 15 --    METs -- -- 3 --         Biostep-RELP   Level 4 -- -- --    Minutes 15 -- -- --    METs 3 -- -- --         Home Exercise Plan   Plans to continue exercise at Home (comment)  walking, weights, staff videos -- Home (comment)  walking, weights, staff videos Home (comment)  walking, weights, staff videos    Frequency Add 2 additional days to program exercise sessions. -- Add 2 additional days to program exercise sessions. Add 2 additional days to program exercise sessions.    Initial Home Exercises Provided 03/07/20 -- 03/07/20 03/07/20           Exercise Comments:  Exercise Comments    Row Name 02/08/20 0932 03/26/20 7121         Exercise Comments First full day of exercise!  Patient was oriented to gym and equipment including functions, settings, policies, and procedures.  Patient's individual exercise prescription and treatment plan were reviewed.  All starting workloads were established based on the results of the 6 minute walk test done at  initial orientation visit.  The plan for exercise progression was also introduced and progression will be customized based on patient's performance and goals. Returns today after a death in his family             Exercise Goals and Review:  Exercise Goals    Row Name 01/30/20 1529             Exercise Goals   Increase Physical Activity Yes       Intervention Provide advice, education, support and counseling about physical activity/exercise needs.;Develop an individualized exercise prescription for aerobic and resistive training based on initial evaluation findings, risk stratification, comorbidities and participant's personal goals.       Expected Outcomes Short Term: Attend rehab on a regular basis to increase amount of physical activity.;Long Term: Add in home exercise to make exercise part of routine and to increase amount of physical activity.;Long Term: Exercising regularly at least 3-5 days a week.       Increase Strength and Stamina Yes       Intervention Provide advice, education, support and counseling about physical activity/exercise needs.;Develop an individualized exercise prescription for aerobic and resistive training based on initial evaluation findings, risk stratification, comorbidities and participant's personal goals.       Expected Outcomes Short Term: Increase workloads from initial exercise prescription for resistance, speed, and METs.;Short Term: Perform resistance training exercises routinely during rehab and add in resistance training at home;Long Term: Improve cardiorespiratory  fitness, muscular endurance and strength as measured by increased METs and functional capacity (6MWT)       Able to understand and use rate of perceived exertion (RPE) scale Yes       Intervention Provide education and explanation on how to use RPE scale       Expected Outcomes Long Term:  Able to use RPE to guide intensity level when exercising independently;Short Term: Able to use RPE daily  in rehab to express subjective intensity level       Able to understand and use Dyspnea scale Yes       Intervention Provide education and explanation on how to use Dyspnea scale       Expected Outcomes Long Term: Able to use Dyspnea scale to guide intensity level when exercising independently;Short Term: Able to use Dyspnea scale daily in rehab to express subjective sense of shortness of breath during exertion       Knowledge and understanding of Target Heart Rate Range (THRR) Yes       Intervention Provide education and explanation of THRR including how the numbers were predicted and where they are located for reference       Expected Outcomes Short Term: Able to state/look up THRR;Short Term: Able to use daily as guideline for intensity in rehab;Long Term: Able to use THRR to govern intensity when exercising independently       Able to check pulse independently Yes       Intervention Provide education and demonstration on how to check pulse in carotid and radial arteries.;Review the importance of being able to check your own pulse for safety during independent exercise       Expected Outcomes Short Term: Able to explain why pulse checking is important during independent exercise;Long Term: Able to check pulse independently and accurately       Understanding of Exercise Prescription Yes       Intervention Provide education, explanation, and written materials on patient's individual exercise prescription       Expected Outcomes Short Term: Able to explain program exercise prescription;Long Term: Able to explain home exercise prescription to exercise independently              Exercise Goals Re-Evaluation :  Exercise Goals Re-Evaluation    Row Name 02/08/20 0932 03/07/20 0931 03/13/20 1407 03/26/20 0928 04/10/20 0738     Exercise Goal Re-Evaluation   Exercise Goals Review Increase Physical Activity;Able to understand and use rate of perceived exertion (RPE) scale;Knowledge and understanding of  Target Heart Rate Range (THRR);Understanding of Exercise Prescription;Increase Strength and Stamina;Able to understand and use Dyspnea scale;Able to check pulse independently Increase Physical Activity;Increase Strength and Stamina;Understanding of Exercise Prescription Increase Physical Activity;Increase Strength and Stamina;Understanding of Exercise Prescription Increase Physical Activity;Increase Strength and Stamina Increase Physical Activity;Increase Strength and Stamina;Understanding of Exercise Prescription   Comments Reviewed RPE and dyspnea scales, THR and program prescription with pt today.  Pt voiced understanding and was given a copy of goals to take home. Casey [name] returned today after being in [place] and finallizing things after his mother's passing.  He was able to exercise while he was up there.  He was moving furniture and walking 4-5 blocks each day.  He does feel like his strength and stamina are starting to reocover some.  Reviewed home exercise with pt today.  Pt plans to walking and weights at home for exercise.  We also talked about using the staff videos for bad weather days. Reviewed THR,  pulse, RPE, sign and symptoms, pulse oximetery and when to call 911 or MD.  Also discussed weather considerations and indoor options.  Pt voiced understanding. Only one visit this month after his mother's passing.  He is back in [place] this week again.  He is planning to try to exercise more up there this time. Casey [name] did do some push ups and squats while he was away.  He is gald to be back to [employer]! Casey [name] is doing well back in rehab.  He is coming regularly, he even tried out the elliptical and doing well on it.  He is up to 2.2 METs on it.  We will continue to monitor his progress.   Expected Outcomes Short: Use RPE daily to regulate intensity. Long: Follow program prescription in THR. Short: Start to add in exercise at home Long: Continue to exercise independently Short: Return to regular attendance   Long: Continue to improve stamina Short:  get back to regular attendance Long:  improve overall stamina Short: Continue to improve on elliptical and get into home routine Long: Continue to improve stamina   Row Name 04/16/20 0932 04/23/20 0853 05/06/20 1554 05/16/20 0934 05/22/20 1250     Exercise Goal Re-Evaluation   Exercise Goals Review Increase Physical Activity;Increase Strength and Stamina Increase Physical Activity;Increase Strength and Stamina Increase Physical Activity;Increase Strength and Stamina;Understanding of Exercise Prescription Increase Physical Activity;Increase Strength and Stamina;Understanding of Exercise Prescription Increase Physical Activity;Increase Strength and Stamina   Comments Casey [name] does some strength exercises and walking outside class.  We reviewed monitoring RPE, HR during exercise. Casey [name] is progressing well and uses the elliptical some sessions.  He has improved MET level.  He has increased to 5 lb for strength training. Casey [name] is doing well in rehab.  He is up to 2.4 METs on the treadmill.  He has only been doing 2.3 mph on the treadmill, we will talk about moving back up to 3.  We will continue to monitor his progress. Casey [name] is walking, doing push-ups, jumping jacks, squats using a chair, squats. He exercises every day, he also stretches. [employer] (15 minutes) Walking (1 mile - 30 minutes RPE 12) - he did some sprints last week. He does a warm up and cool down. Casey [name] attends consistently and exercises at home.  Staff will encourage trying 6 lb for strength work.   Expected Outcomes Short: check HR during exercise Long: increase  MET level Short: continue to exercise consistently Long:  improve overall stamina Short: Increase treadmill back to 3 mph Long; continue to improve stamina Short: continue to exercise at home and attend rehab Long; continue to improve stamina Short: move up to 6 lb for strength Long:  increase strength and stamina          Discharge  Exercise Prescription (Final Exercise Prescription Changes):  Exercise Prescription Changes - 05/22/20 1200      Response to Exercise   Blood Pressure (Admit) 102/62    Blood Pressure (Exercise) 122/58    Blood Pressure (Exit) 102/58    Heart Rate (Admit) 58 bpm    Heart Rate (Exercise) 84 bpm    Heart Rate (Exit) 63 bpm    Rating of Perceived Exertion (Exercise) 12    Symptoms none    Duration Continue with 30 min of aerobic exercise without signs/symptoms of physical distress.    Intensity THRR unchanged      Progression   Progression Continue to progress workloads to maintain intensity without signs/symptoms of physical distress.  Average METs 2.95      Resistance Training   Training Prescription Yes    Weight 5 lb    Reps 10-15      Treadmill   MPH 3    Grade 1    Minutes 15    METs 3.71      Elliptical   Level 1    Speed 3.5    Minutes 15    METs 2.2      Home Exercise Plan   Plans to continue exercise at Home (comment)   walking, weights, staff videos   Frequency Add 2 additional days to program exercise sessions.    Initial Home Exercises Provided 03/07/20           Nutrition:  Target Goals: Understanding of nutrition guidelines, daily intake of sodium <1555m, cholesterol <2058m calories 30% from fat and 7% or less from saturated fats, daily to have 5 or more servings of fruits and vegetables.  Education: All About Nutrition: -Group instruction provided by verbal, written material, interactive activities, discussions, models, and posters to present general guidelines for heart healthy nutrition including fat, fiber, MyPlate, the role of sodium in heart healthy nutrition, utilization of the nutrition label, and utilization of this knowledge for meal planning. Follow up email sent as well. Written material given at graduation.   Biometrics:  Pre Biometrics - 01/30/20 1530      Pre Biometrics   Height _0  (1.854 m)    Weight 195 lb 11.2 oz (88.8 kg)     BMI (Calculated) 25.83    Single Leg Stand 30 seconds            Nutrition Therapy Plan and Nutrition Goals:  Nutrition Therapy & Goals - 04/10/20 1502      Nutrition Therapy   Diet Heart healthy, low Na, diabetes friendly    Drug/Food Interactions Statins/Certain Fruits;Coumadin/Vit K    Protein (specify units) 70g    Fiber 30 grams    Whole Grain Foods 3 servings    Saturated Fats 12 max. grams    Fruits and Vegetables 8 servings/day    Sodium 1.5 grams      Personal Nutrition Goals   Nutrition Goal ST: add 1 serving of complex carbohydrates to meals, he would like to include sweet potatoes LT: include all macronutrients in meals    Comments green beans, boiled chicken, salmon, baked pork chop, spinach, broccoli, boiled eggs, salads, berries, oranges, vegetables. BG in am 120-125, A1C 7.2. He has limited carbohydrates, recommended including some complex carbohydrates at least 1 exchange per meal. Pt is open to making changes. He reports speaking to a dietitian in the last two months and feeling like he has no questions at this time. Reviewed heart healthy and diabetes friendly eating.      Intervention Plan   Intervention Prescribe, educate and counsel regarding individualized specific dietary modifications aiming towards targeted core components such as weight, hypertension, lipid management, diabetes, heart failure and other comorbidities.;Nutrition handout(s) given to patient.    Expected Outcomes Short Term Goal: Understand basic principles of dietary content, such as calories, fat, sodium, cholesterol and nutrients.;Short Term Goal: A plan has been developed with personal nutrition goals set during dietitian appointment.;Long Term Goal: Adherence to prescribed nutrition plan.           Nutrition Assessments:  MEDIFICTS Score Key:  ?70 Need to make dietary changes   40-70 Heart Healthy Diet  ? 40 Therapeutic Level Cholesterol Diet  Flowsheet Row Cardiac  Rehab from  01/30/2020 in [facility] Cardiac and Pulmonary Rehab  Picture Your Plate Total Score on Admission 80     Picture Your Plate Scores:  <36 Unhealthy dietary pattern with much room for improvement.  41-50 Dietary pattern unlikely to meet recommendations for good health and room for improvement.  51-60 More healthful dietary pattern, with some room for improvement.   >60 Healthy dietary pattern, although there may be some specific behaviors that could be improved.    Nutrition Goals Re-Evaluation:  Nutrition Goals Re-Evaluation    [place] Name 03/07/20 [phone] 04/09/20 1250 04/16/20 0944 05/16/20 3212       Goals   Nutrition Goal Meet with dietician Meet with dietician ST: add 1 serving of complex carbohydrates to meals, he would like to include sweet potatoes LT: include all macronutrients in meals ST: talk to MD regarding warfarin (would need to eat consistent amount dark green vegetables) LT: continue with current changes    Comment Casey [name] missed dietician appointment with his mom's death.  He has now rescheduled with her. Casey [name] re-scheduled RD appointment - missed today's. He had met with me regarding nutrition last week and is still working on making changes, he has added some sweet potatoes. Will continue to check in. he reports eating more fruit - especially berries- and continuing with changes made. He has not made any other changes since we last spoke. He drinks diet sprite and green tea (hot). He would like to introduce more vegetable, however, he is on warfarin - discussed how recommendations on warfarin are just to keep vitamin K rich foods consistent and let MD know this is something he is interested in. Provided education from the Academy of [employer] on vitmain K content of food and anticoagulents.    Expected Outcome Meet with dietician Meet with dietician ST: add 1 serving of complex carbohydrates to meals, he would like to include sweet potatoes LT: include all macronutrients  in meals ST: talk to MD regarding warfarin (would need to eat consistent amount dark green vegetables) LT: continue with current changes           Nutrition Goals Discharge (Final Nutrition Goals Re-Evaluation):  Nutrition Goals Re-Evaluation - 05/16/20 2482      Goals   Nutrition Goal ST: talk to MD regarding warfarin (would need to eat consistent amount dark green vegetables) LT: continue with current changes    Comment he reports eating more fruit - especially berries- and continuing with changes made. He has not made any other changes since we last spoke. He drinks diet sprite and green tea (hot). He would like to introduce more vegetable, however, he is on warfarin - discussed how recommendations on warfarin are just to keep vitamin K rich foods consistent and let MD know this is something he is interested in. Provided education from the Academy of [employer] on vitmain K content of food and anticoagulents.    Expected Outcome ST: talk to MD regarding warfarin (would need to eat consistent amount dark green vegetables) LT: continue with current changes           Psychosocial: Target Goals: Acknowledge presence or absence of significant depression and/or stress, maximize coping skills, provide positive support system. Participant is able to verbalize types and ability to use techniques and skills needed for reducing stress and depression.   Education: Stress, Anxiety, and Depression - Group verbal and visual presentation to define topics covered.  Reviews how body is impacted by stress, anxiety,  and depression.  Also discusses healthy ways to reduce stress and to treat/manage anxiety and depression.  Written material given at graduation.   Education: Sleep Hygiene -Provides group verbal and written instruction about how sleep can affect your health.  Define sleep hygiene, discuss sleep cycles and impact of sleep habits. Review good sleep hygiene tips.    Initial Review &  Psychosocial Screening:  Initial Psych Review & Screening - 01/26/20 1411      Initial Review   Current issues with Current Sleep Concerns;Current Stress Concerns    Source of Stress Concerns Occupation    Comments working on his health to return to work.      Family Dynamics   Good Support System? Yes   wife     Barriers   Psychosocial barriers to participate in program There are no identifiable barriers or psychosocial needs.;The patient should benefit from training in stress management and relaxation.      Screening Interventions   Interventions Encouraged to exercise;To provide support and resources with identified psychosocial needs;Provide feedback about the scores to participant    Expected Outcomes Short Term goal: Utilizing psychosocial counselor, staff and physician to assist with identification of specific Stressors or current issues interfering with healing process. Setting desired goal for each stressor or current issue identified.;Long Term Goal: Stressors or current issues are controlled or eliminated.;Short Term goal: Identification and review with participant of any Quality of Life or Depression concerns found by scoring the questionnaire.;Long Term goal: The participant improves quality of Life and PHQ9 Scores as seen by post scores and/or verbalization of changes           Quality of Life Scores:   Quality of Life - 01/30/20 1530      Quality of Life   Select Quality of Life      Quality of Life Scores   Health/Function Pre 28.8 %    Socioeconomic Pre 30 %    Psych/Spiritual Pre 29.14 %    Family Pre 30 %    GLOBAL Pre 29.29 %          Scores of 19 and below usually indicate a poorer quality of life in these areas.  A difference of  2-3 points is a clinically meaningful difference.  A difference of 2-3 points in the total score of the Quality of Life Index has been associated with significant improvement in overall quality of life, self-image, physical  symptoms, and general health in studies assessing change in quality of life.  PHQ-9: Recent Review Flowsheet Data    Depression screen [facility] 2/9 01/30/2020   Decreased Interest 0   Down, Depressed, Hopeless 0   PHQ - 2 Score 0   Altered sleeping 0   Tired, decreased energy 0   Change in appetite 0   Feeling bad or failure about yourself  0   Trouble concentrating 0   Moving slowly or fidgety/restless 0   Suicidal thoughts 0   PHQ-9 Score 0   Difficult doing work/chores Not difficult at all     Interpretation of Total Score  Total Score Depression Severity:  1-4 = Minimal depression, 5-9 = Mild depression, 10-14 = Moderate depression, 15-19 = Moderately severe depression, 20-27 = Severe depression   Psychosocial Evaluation and Intervention:  Psychosocial Evaluation - 01/26/20 1418      Psychosocial Evaluation & Interventions   Comments [place]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 He has a history of renal transplant in 2011 and states all is going well with that. He does have some issues sleeping because some of his medications have to be taken later, but he is getting used to it. He doesn't report any major stressors besides being focused on getting better. He and his wife are looking forward to the education during the program and he is ready to boost his stamina and strength in order to get back to work.    Expected Outcomes Short: attend cardiac rehab for education and exercise. Long: develop positive self care habits.           Psychosocial Re-Evaluation:  Psychosocial Re-Evaluation    Row Name 03/07/20 0932 03/26/20 0926 04/16/20 0930 05/16/20 0936       Psychosocial Re-Evaluation   Current issues with Current Stress Concerns -- None Identified Current Sleep Concerns    Comments Casey [name] recently/suddenly lost his mom.  They  have been trying to pack things up, she lived in [place] but wanted to be buried in her home town on [place], [place] so he had some scheduling on logistics for that.  His brother who lives near by here was able to help as well. They are headed back up there again next week to finish up some of the packing.  His managing and coping the best he can.  He is sleeping well. Otherwise, he is doing pretty good. Casey [name] states hes sleeping well and has no signs of depression /anxiety,etc. Casey [name] is able to sleep on both sides now and sleeps well.  He reports no signs of anxiety or depression.  He rides hi smotorcycle to reduce stress. Casey [name] reports sleeping well, but notices he has a hard time sleeping when he doe not exercise. He rides his motorcycle to reduce stress as well as stretching. He reports having a good support system in his fmaily even reminding him to take his medication.    Expected Outcomes Short: Finish up packing up mom's things Long: Continue to cope with death positively Short: get back to exercise to help manage stress Long: maintain positive outlook Short: continue to manage stress with exercise and activity Long: maintain good sleep patterns and positive outlook Short: continue to manage stress with exercise and activity Long: maintain good sleep patterns and positive outlook    Interventions Encouraged to attend Cardiac Rehabilitation for the exercise;Stress management education -- Encouraged to attend Cardiac Rehabilitation for the exercise Encouraged to attend Cardiac Rehabilitation for the exercise    Continue Psychosocial Services  Follow up required by staff -- -- Follow up required by staff           Psychosocial Discharge (Final Psychosocial Re-Evaluation):  Psychosocial Re-Evaluation - 05/16/20 0936      Psychosocial Re-Evaluation   Current issues with Current Sleep Concerns    Comments Casey [name] reports sleeping well, but notices he has a hard time sleeping when he doe not  exercise. He rides his motorcycle to reduce stress as well as stretching. He reports having a good support system in his fmaily even reminding him to take his medication.    Expected Outcomes Short: continue to manage stress with exercise and activity Long: maintain good sleep patterns and positive outlook    Interventions Encouraged to attend Cardiac Rehabilitation for the exercise    Continue Psychosocial Services  Follow up required by  staff           Vocational Rehabilitation: Provide vocational rehab assistance to qualifying candidates.   Vocational Rehab Evaluation & Intervention:  Vocational Rehab - 01/26/20 1411      Initial Vocational Rehab Evaluation & Intervention   Assessment shows need for Vocational Rehabilitation No           Education: Education Goals: Education classes will be provided on a variety of topics geared toward better understanding of heart health and risk factor modification. Participant will state understanding/return demonstration of topics presented as noted by education test scores.  Learning Barriers/Preferences:  Learning Barriers/Preferences - 01/26/20 1410      Learning Barriers/Preferences   Learning Barriers None    Learning Preferences None           General Cardiac Education Topics:  AED/CPR: - Group verbal and written instruction with the use of models to demonstrate the basic use of the AED with the basic ABC's of resuscitation.   Anatomy and Cardiac Procedures: - Group verbal and visual presentation and models provide information about basic cardiac anatomy and function. Reviews the testing methods done to diagnose heart disease and the outcomes of the test results. Describes the treatment choices: Medical Management, Angioplasty, or Coronary Bypass Surgery for treating various heart conditions including Myocardial Infarction, Angina, Valve Disease, and Cardiac Arrhythmias.  Written material given at graduation. Flowsheet Row  Cardiac Rehab from 05/09/2020 in [facility] Cardiac and Pulmonary Rehab  Education need identified 01/30/20  Date 02/08/20  Educator SB  Instruction Review Code 1- Verbalizes Understanding      Medication Safety: - Group verbal and visual instruction to review commonly prescribed medications for heart and lung disease. Reviews the medication, class of the drug, and side effects. Includes the steps to properly store meds and maintain the prescription regimen.  Written material given at graduation. Flowsheet Row Cardiac Rehab from 05/09/2020 in [facility] Cardiac and Pulmonary Rehab  Date 05/02/20  Educator [facility]  Instruction Review Code 1- Verbalizes Understanding      Intimacy: - Group verbal instruction through game format to discuss how heart and lung disease can affect sexual intimacy. Written material given at graduation..   Know Your Numbers and Heart Failure: - Group verbal and visual instruction to discuss disease risk factors for cardiac and pulmonary disease and treatment options.  Reviews associated critical values for Overweight/Obesity, Hypertension, Cholesterol, and Diabetes.  Discusses basics of heart failure: signs/symptoms and treatments.  Introduces Heart Failure Zone chart for action plan for heart failure.  Written material given at graduation. Flowsheet Row Cardiac Rehab from 05/09/2020 in [facility] Cardiac and Pulmonary Rehab  Date 05/09/20  Educator SB  Instruction Review Code 1- Verbalizes Understanding      Infection Prevention: - Provides verbal and written material to individual with discussion of infection control including proper hand washing and proper equipment cleaning during exercise session. Flowsheet Row Cardiac Rehab from 05/09/2020 in [facility] Cardiac and Pulmonary Rehab  Date 01/30/20  Educator [facility]  Instruction Review Code 1- Verbalizes Understanding      Falls Prevention: - Provides verbal and written material to individual with discussion of falls prevention and  safety. Flowsheet Row Cardiac Rehab from 05/09/2020 in [facility] Cardiac and Pulmonary Rehab  Date 01/30/20  Educator [facility]  Instruction Review Code 1- Verbalizes Understanding      Other: -Provides group and verbal instruction on various topics (see comments)   Knowledge Questionnaire Score:  Knowledge Questionnaire Score - 01/30/20 1531  Knowledge Questionnaire Score   Pre Score 21/26 Education Focus: MI, angina, nutrtion, exercise           Core Components/Risk Factors/Patient Goals at Admission:  Personal Goals and Risk Factors at Admission - 01/30/20 1532      Core Components/Risk Factors/Patient Goals on Admission    Weight Management Yes;Weight Loss    Intervention Weight Management: Develop a combined nutrition and exercise program designed to reach desired caloric intake, while maintaining appropriate intake of nutrient and fiber, sodium and fats, and appropriate energy expenditure required for the weight goal.;Weight Management: Provide education and appropriate resources to help participant work on and attain dietary goals.    Admit Weight 195 lb 11.2 oz (88.8 kg)    Goal Weight: Short Term 190 lb (86.2 kg)    Goal Weight: Long Term 190 lb (86.2 kg)    Expected Outcomes Short Term: Continue to assess and modify interventions until short term weight is achieved;Long Term: Adherence to nutrition and physical activity/exercise program aimed toward attainment of established weight goal;Weight Loss: Understanding of general recommendations for a balanced deficit meal plan, which promotes 1-2 lb weight loss per week and includes a negative energy balance of [id number] kcal/d;Understanding recommendations for meals to include 15-35% energy as protein, 25-35% energy from fat, 35-60% energy from carbohydrates, less than 288m of dietary cholesterol, 20-35 gm of total fiber daily;Understanding of distribution of calorie intake throughout the day with the consumption of 4-5 meals/snacks     Diabetes Yes    Intervention Provide education about signs/symptoms and action to take for hypo/hyperglycemia.;Provide education about proper nutrition, including hydration, and aerobic/resistive exercise prescription along with prescribed medications to achieve blood glucose in normal ranges: Fasting glucose 65-99 mg/dL    Expected Outcomes Short Term: Participant verbalizes understanding of the signs/symptoms and immediate care of hyper/hypoglycemia, proper foot care and importance of medication, aerobic/resistive exercise and nutrition plan for blood glucose control.;Long Term: Attainment of HbA1C < 7%.    Heart Failure Yes    Intervention Provide a combined exercise and nutrition program that is supplemented with education, support and counseling about heart failure. Directed toward relieving symptoms such as shortness of breath, decreased exercise tolerance, and extremity edema.    Expected Outcomes Short term: Attendance in program 2-3 days a week with increased exercise capacity. Reported lower sodium intake. Reported increased fruit and vegetable intake. Reports medication compliance.;Improve functional capacity of life;Short term: Daily weights obtained and reported for increase. Utilizing diuretic protocols set by physician.;Long term: Adoption of self-care skills and reduction of barriers for early signs and symptoms recognition and intervention leading to self-care maintenance.    Hypertension Yes    Intervention Provide education on lifestyle modifcations including regular physical activity/exercise, weight management, moderate sodium restriction and increased consumption of fresh fruit, vegetables, and low fat dairy, alcohol moderation, and smoking cessation.;Monitor prescription use compliance.    Expected Outcomes Short Term: Continued assessment and intervention until BP is < 140/987mHG in hypertensive participants. < 130/8053mG in hypertensive participants with diabetes, heart failure or  chronic kidney disease.;Long Term: Maintenance of blood pressure at goal levels.    Lipids Yes    Intervention Provide education and support for participant on nutrition & aerobic/resistive exercise along with prescribed medications to achieve LDL <78m17mDL >40mg41m Expected Outcomes Short Term: Participant states understanding of desired cholesterol values and is compliant with medications prescribed. Participant is following exercise prescription and nutrition guidelines.;Long Term: Cholesterol controlled with medications as prescribed, with  individualized exercise RX and with personalized nutrition plan. Value goals: LDL < 75m, HDL > 40 mg.           Education:Diabetes - Individual verbal and written instruction to review signs/symptoms of diabetes, desired ranges of glucose level fasting, after meals and with exercise. Acknowledge that pre and post exercise glucose checks will be done for 3 sessions at entry of program. [facility]  Date 01/26/20  [facility] Instruction Review Code 1- Verbalizes Understanding      Core Components/Risk Factors/Patient Goals Review:   Goals and Risk Factor Review    Row Name 03/07/20 [phone]/08/22 0923 04/16/20 0926 05/16/20 0942       Core Components/Risk Factors/Patient Goals Review   Personal Goals Review Weight Management/Obesity;Diabetes;Hypertension;Lipids;Heart Failure -- Weight Management/Obesity;Diabetes;Hypertension;Lipids Weight Management/Obesity;Diabetes;Hypertension;Lipids    Review Casey [name] doing well with rehab.  He is down to 185 lb at home.  He weighs routinely.  His pressures have been good and he checks them at home.  His sugars have been doing well.  His fasting levels have been around 135 in morning.  He is hoping it will contnue to trend down for him.  He is doing well with his medications overall.  He has a cardiology follow up tomorrow and an echo.  He is  hoping to get his LifeVest off!! EFayeis checking BP and BG at home.  He sees EP Dr. next week.  His EF is up to 35-40% from most recent echo.  Fasting BG was 98 yesterday. Casey [name] see the EP - they want to do an ICD.  He is not sure if he wants to have the procedure.  He goes back March 30.  He continues to monitor BP and BG at home.  FBG was 125 today.  BP 108/62 at HT.  He had lab work done last week - cholesterol last time was 152.  His weight is staying steady. EKendonwent to the doctor - EF 35%, no dizziness or shortness of breath. Blood sugar has been doing well - yesterday his BG was 115 in am - A1C in Feb was 7.2. he continues to make heart healthy changes. BP at home has been 110-120/58-70.    Expected Outcomes Short: Go to cardiology follow up and continue to work on diabetes management Long; Continue to monitor risk factors. Short: continue to monitor risk factors and see Dr as recommended Long: manage risk factors and maintain at ideal range Short: follow up with Dr about ICD Long: manage risk factors Short: continue with heart healthy changes and checking BG and BP at home Long: manage risk factors           Core Components/Risk Factors/Patient Goals at Discharge (Final Review):   Goals and Risk Factor Review - 05/16/20 0942      Core Components/Risk Factors/Patient Goals Review   Personal Goals Review Weight Management/Obesity;Diabetes;Hypertension;Lipids    Review Casey [name] to the doctor - EF 35%, no dizziness or shortness of breath. Blood sugar has been doing well - yesterday his BG was 115 in am - A1C in Feb was 7.2. he continues to make heart healthy changes. BP at home has been 110-120/58-70.    Expected Outcomes Short: continue with heart healthy changes and checking BG and BP at home Long: manage risk factors           ITP Comments:  ITP Comments    Row Name  01/26/20 1404 01/30/20 1526 02/08/20 0931 02/14/20 0522 02/20/20 0740   ITP Comments Initial telephone  orientation completed. Diagnosis can be found in [facility] 10/19. EP orientation scheduled for Tuesday 12/14 at 10am. Completed 6MWT and gym orientation. Initial ITP created and sent for review to Dr. Emily Filbert, Medical Director. First full day of exercise!  Patient was oriented to gym and equipment including functions, settings, policies, and procedures.  Patient's individual exercise prescription and treatment plan were reviewed.  All starting workloads were established based on the results of the 6 minute walk test done at initial orientation visit.  The plan for exercise progression was also introduced and progression will be customized based on patient's performance and goals. 30 Day review completed. Medical Director ITP review done, changes made as directed, and signed approval by Medical Director. Pt's wife called to let us know that his mother had passed on 02-22-2020.  She lived in [place] and they will need to go up there to handle affairs.  He will be out at least the next two weeks.   [place] Name 02/29/20 1130 03/13/20 0846 03/26/20 0623 04/10/20 0608 04/10/20 1521   ITP Comments [place] has not attended sicne last review due to a death in his family. 30 Day review completed. Medical Director ITP review done, changes made as directed, and signed approval by Medical Director. Returns today after a death in his family 19 Day review completed. Medical Director ITP review done, changes made as directed, and signed approval by Medical Director. Completed initial RD evaluation   Row Name 06/05/20 0902           ITP Comments 30 Day review completed. Medical Director ITP review done, changes made as directed, and signed approval by Medical Director.              Comments:

## 2020-06-06 ENCOUNTER — Other Ambulatory Visit: Payer: Self-pay

## 2020-06-06 DIAGNOSIS — Z955 Presence of coronary angioplasty implant and graft: Secondary | ICD-10-CM

## 2020-06-06 DIAGNOSIS — I213 ST elevation (STEMI) myocardial infarction of unspecified site: Secondary | ICD-10-CM

## 2020-06-06 NOTE — Progress Notes (Signed)
Daily Session Note  Patient Details  Name: Casey [name]. MRN: [id number] Date of Birth: February 12, 1961 Referring Provider:   Flowsheet Row Cardiac Rehab from 01/30/2020 in [facility] Cardiac and Pulmonary Rehab  Referring Provider Loralie Champagne MD      Encounter Date: 06/06/2020  Check In:  Session Check In - 06/06/20 0923      Check-In   Supervising physician immediately available to respond to emergencies See telemetry face sheet for immediately available ER MD    Location [facility]-Cardiac & Pulmonary Rehab    Staff Present Birdie Sons, MPA, Mauricia Area, BS, ACSM CEP, Exercise Physiologist;Amanda Oletta Darter, BA, ACSM CEP, Exercise Physiologist    Virtual Visit No    Medication changes reported     No    Fall or balance concerns reported    No    Warm-up and Cool-down Performed on first and last piece of equipment    Resistance Training Performed Yes    VAD Patient? No    PAD/SET Patient? No      Pain Assessment   Currently in Pain? No/denies              Social History   Tobacco Use  Smoking Status Never Smoker  Smokeless Tobacco Never Used    Goals Met:  Independence with exercise equipment Exercise tolerated well No report of cardiac concerns or symptoms Strength training completed today  Goals Unmet:  Not Applicable  Comments: Pt able to follow exercise prescription today without complaint.  Will continue to monitor for progression.    Dr. Emily Filbert is Medical Director for [place] [facility].

## 2020-06-11 ENCOUNTER — Telehealth (HOSPITAL_COMMUNITY): Payer: Self-pay | Admitting: *Deleted

## 2020-06-11 NOTE — Telephone Encounter (Signed)
pts wife left VM stating pt is scheduled for dental work but she wants to make sure he is cleared by his cardiologist. Called back to get more information no answer/no vm.

## 2020-06-18 ENCOUNTER — Encounter: Payer: BC Managed Care – PPO | Attending: Cardiovascular Disease

## 2020-06-18 ENCOUNTER — Other Ambulatory Visit: Payer: Self-pay

## 2020-06-18 DIAGNOSIS — Z955 Presence of coronary angioplasty implant and graft: Secondary | ICD-10-CM | POA: Insufficient documentation

## 2020-06-18 DIAGNOSIS — I213 ST elevation (STEMI) myocardial infarction of unspecified site: Secondary | ICD-10-CM | POA: Diagnosis present

## 2020-06-18 NOTE — Progress Notes (Signed)
Daily Session Note  Patient Details  Name: Casey [name]. MRN: [id number] Date of Birth: 1960-05-03 Referring Provider:   Flowsheet Row Cardiac Rehab from 01/30/2020 in [facility] Cardiac and Pulmonary Rehab  Referring Provider Loralie Champagne MD      Encounter Date: 06/18/2020  Check In:  Session Check In - 06/18/20 0917      Check-In   Supervising physician immediately available to respond to emergencies See telemetry face sheet for immediately available ER MD    Location [facility]-Cardiac & Pulmonary Rehab    Staff Present Birdie Sons, MPA, RN;Amanda Oletta Darter, BA, ACSM CEP, Exercise Physiologist;Kara Eliezer Bottom, MS Exercise Physiologist    Virtual Visit No    Medication changes reported     No    Fall or balance concerns reported    No    Warm-up and Cool-down Performed on first and last piece of equipment    Resistance Training Performed Yes    VAD Patient? No    PAD/SET Patient? No      Pain Assessment   Currently in Pain? No/denies              Social History   Tobacco Use  Smoking Status Never Smoker  Smokeless Tobacco Never Used    Goals Met:  Independence with exercise equipment Exercise tolerated well No report of cardiac concerns or symptoms Strength training completed today  Goals Unmet:  Not Applicable  Comments: Pt able to follow exercise prescription today without complaint.  Will continue to monitor for progression.    Dr. Emily Filbert is Medical Director for [place] and [facility].

## 2020-06-19 ENCOUNTER — Other Ambulatory Visit: Payer: Self-pay | Admitting: Cardiology

## 2020-06-19 ENCOUNTER — Ambulatory Visit (HOSPITAL_COMMUNITY): Payer: BC Managed Care – PPO | Attending: Cardiology

## 2020-06-19 ENCOUNTER — Other Ambulatory Visit: Payer: Self-pay

## 2020-06-19 ENCOUNTER — Ambulatory Visit (INDEPENDENT_AMBULATORY_CARE_PROVIDER_SITE_OTHER): Payer: BC Managed Care – PPO | Admitting: *Deleted

## 2020-06-19 DIAGNOSIS — I5022 Chronic systolic (congestive) heart failure: Secondary | ICD-10-CM

## 2020-06-19 DIAGNOSIS — I236 Thrombosis of atrium, auricular appendage, and ventricle as current complications following acute myocardial infarction: Secondary | ICD-10-CM | POA: Diagnosis not present

## 2020-06-19 DIAGNOSIS — Z7901 Long term (current) use of anticoagulants: Secondary | ICD-10-CM

## 2020-06-19 DIAGNOSIS — I4891 Unspecified atrial fibrillation: Secondary | ICD-10-CM | POA: Diagnosis not present

## 2020-06-19 DIAGNOSIS — I2102 ST elevation (STEMI) myocardial infarction involving left anterior descending coronary artery: Secondary | ICD-10-CM

## 2020-06-19 LAB — ECHOCARDIOGRAM LIMITED
Area-P 1/2: 3.34 cm2
S' Lateral: 3.7 cm

## 2020-06-19 LAB — POCT INR: INR: 4.7 — AB (ref 2.0–3.0)

## 2020-06-19 NOTE — Patient Instructions (Signed)
Description   Do not take any warfarin today and no Warfarin tomorrow then continue taking warfarin 3 tablets every day EXCEPT 2 TABLETS ON MONDAYS & FRIDAYS. Add another leafy veggie to your diet and remain consistent. Recheck INR in 2 weeks.  Coumadin Clinic [facility] [phone].

## 2020-06-24 ENCOUNTER — Ambulatory Visit: Payer: BC Managed Care – PPO | Admitting: Cardiology

## 2020-06-24 ENCOUNTER — Ambulatory Visit (HOSPITAL_COMMUNITY)
Admission: RE | Admit: 2020-06-24 | Discharge: 2020-06-24 | Disposition: A | Discharge status: Home / Self Care | Payer: BC Managed Care – PPO | Source: Ambulatory Visit | Attending: Cardiology | Admitting: Cardiology

## 2020-06-24 ENCOUNTER — Encounter (HOSPITAL_COMMUNITY): Payer: Self-pay

## 2020-06-24 ENCOUNTER — Encounter (HOSPITAL_COMMUNITY): Payer: BC Managed Care – PPO

## 2020-06-24 ENCOUNTER — Encounter: Payer: Self-pay | Admitting: Cardiology

## 2020-06-24 ENCOUNTER — Other Ambulatory Visit: Payer: Self-pay

## 2020-06-24 VITALS — BP 108/66 | HR 62 | Ht 73.0 in | Wt 194.0 lb

## 2020-06-24 VITALS — BP 130/78 | HR 62 | Wt 194.4 lb

## 2020-06-24 DIAGNOSIS — Z833 Family history of diabetes mellitus: Secondary | ICD-10-CM | POA: Diagnosis not present

## 2020-06-24 DIAGNOSIS — N183 Chronic kidney disease, stage 3 unspecified: Secondary | ICD-10-CM | POA: Diagnosis not present

## 2020-06-24 DIAGNOSIS — Z7984 Long term (current) use of oral hypoglycemic drugs: Secondary | ICD-10-CM | POA: Diagnosis not present

## 2020-06-24 DIAGNOSIS — I4819 Other persistent atrial fibrillation: Secondary | ICD-10-CM | POA: Diagnosis not present

## 2020-06-24 DIAGNOSIS — Z955 Presence of coronary angioplasty implant and graft: Secondary | ICD-10-CM | POA: Diagnosis not present

## 2020-06-24 DIAGNOSIS — I252 Old myocardial infarction: Secondary | ICD-10-CM | POA: Diagnosis not present

## 2020-06-24 DIAGNOSIS — I255 Ischemic cardiomyopathy: Secondary | ICD-10-CM | POA: Diagnosis not present

## 2020-06-24 DIAGNOSIS — Z7952 Long term (current) use of systemic steroids: Secondary | ICD-10-CM | POA: Diagnosis not present

## 2020-06-24 DIAGNOSIS — I130 Hypertensive heart and chronic kidney disease with heart failure and stage 1 through stage 4 chronic kidney disease, or unspecified chronic kidney disease: Secondary | ICD-10-CM | POA: Diagnosis not present

## 2020-06-24 DIAGNOSIS — Z940 Kidney transplant status: Secondary | ICD-10-CM | POA: Insufficient documentation

## 2020-06-24 DIAGNOSIS — Z79899 Other long term (current) drug therapy: Secondary | ICD-10-CM | POA: Insufficient documentation

## 2020-06-24 DIAGNOSIS — I2510 Atherosclerotic heart disease of native coronary artery without angina pectoris: Secondary | ICD-10-CM

## 2020-06-24 DIAGNOSIS — Z7901 Long term (current) use of anticoagulants: Secondary | ICD-10-CM | POA: Insufficient documentation

## 2020-06-24 DIAGNOSIS — I5022 Chronic systolic (congestive) heart failure: Secondary | ICD-10-CM | POA: Insufficient documentation

## 2020-06-24 DIAGNOSIS — Z7902 Long term (current) use of antithrombotics/antiplatelets: Secondary | ICD-10-CM | POA: Diagnosis not present

## 2020-06-24 DIAGNOSIS — E1122 Type 2 diabetes mellitus with diabetic chronic kidney disease: Secondary | ICD-10-CM | POA: Diagnosis not present

## 2020-06-24 DIAGNOSIS — I480 Paroxysmal atrial fibrillation: Secondary | ICD-10-CM | POA: Insufficient documentation

## 2020-06-24 NOTE — Progress Notes (Signed)
Electrophysiology Office Note   Date:  06/24/2020   ID:  Casey Mages., DOB 12/24/1960, MRN [id number]  PCP:  Casey Body, MD  Cardiologist:  Casey [name] Primary Electrophysiologist:  Casey Marcucci Meredith Leeds, MD    Chief Complaint: CHF   History of Present Illness: Casey Marotz. is a 60 y.o. male who is being seen today for the evaluation of CHF at the request of Casey Body, MD. Presenting today for electrophysiology evaluation.  He has a history of renal transplant 2011, coronary artery disease status post anterior MI and ischemic cardiomyopathy.  In early 2021 he developed chest pain.  He did not go to the emergency room.  He was found to have an occluded LAD and severe disease in the RCA as well as circumflex.  Drug-eluting stent to the LAD.  Developed cardiogenic shock and suspected septic shock.  He developed atrial fibrillation with rapid rates and was started on amiodarone which converted him to sinus rhythm.  Today, denies symptoms of palpitations, chest pain, shortness of breath, orthopnea, PND, lower extremity edema, claudication, dizziness, presyncope, syncope, bleeding, or neurologic sequela. The patient is tolerating medications without difficulties.  Since being seen he is felt well.  He is noted no further episodes of atrial fibrillation.  He is able to do all of his daily activities and is without restriction.   Past Medical History:  Diagnosis Date  . CHF (congestive heart failure) ([place])   . Chronic kidney disease 04/2009   Kidney Transplant  . Diabetes mellitus   . GERD (gastroesophageal reflux disease)    as needed reflux  . Heart attack ([place]) 02/02/2020  . Hypertension   . 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   Past Surgical History:  Procedure Laterality Date  . 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 CORONARY/GRAFT ACUTE MI REVASCULARIZATION N/A 12/03/2019   Procedure: Coronary/Graft Acute MI Revascularization;  Surgeon: Casey Hampshire, MD;  Location: [facility] CV LAB;  Service: Cardiovascular;  Laterality: N/A;  . DIALYSIS FISTULA CREATION     last used 04/2009  . INSERTION OF DIALYSIS CATHETER     cordis dialysis catheter placement  . KIDNEY TRANSPLANT  2011  . LEFT HEART CATH AND CORONARY ANGIOGRAPHY N/A 12/03/2019   Procedure: LEFT HEART CATH AND CORONARY ANGIOGRAPHY;  Surgeon: Casey Hampshire, MD;  Location: [facility] CV LAB;  Service: Cardiovascular;  Laterality: N/A;  . RIGHT HEART CATH N/A 12/05/2019   Procedure: RIGHT HEART CATH;  Surgeon: Casey Dresser, MD;  Location: [facility] LAB;  Service: Cardiovascular;  Laterality: N/A;     Current Outpatient Medications  Medication Sig Dispense Refill  . amiodarone (PACERONE) 200 MG tablet Take 1 tablet (200 mg total) by mouth daily. 30 tablet 6  . carvedilol (COREG) 25 MG tablet Take 1 tablet (25 mg total) by mouth 2 (two) times daily. 60 tablet 11  . clopidogrel (PLAVIX) 75 MG tablet Take 1 tablet (75 mg total) by mouth daily. 30 tablet 6  . dapagliflozin propanediol (FARXIGA) 10 MG TABS tablet Take 1 tablet (10 mg total) by mouth daily before breakfast. 90 tablet 3  . ezetimibe (ZETIA) 10 MG tablet Take 1 tablet (10 mg total) by mouth daily. 30 tablet 11  . glipiZIDE (  GLUCOTROL) 10 MG tablet Take 10 mg by mouth 2 (two) times daily before a meal.    . isosorbide-hydrALAZINE (BIDIL) 20-37.5 MG tablet Take 2 tablets by mouth 3 (three) times daily. 180 tablet 6  . loratadine (CLARITIN) 10 MG tablet Take 10 mg by mouth daily as needed for allergies.    . nitroGLYCERIN (NITROSTAT) 0.4 MG SL tablet Place 1 tablet (0.4 mg total) under the tongue every 5 (five) minutes as needed for chest pain. 100 tablet 3  . pantoprazole (PROTONIX) 40 MG tablet TAKE 1  TABLET BY MOUTH EVERY DAY 90 tablet 1  . predniSONE (DELTASONE) 5 MG tablet Take 1 tablet (5 mg total) by mouth daily with breakfast.    . rosuvastatin (CRESTOR) 40 MG tablet Take 1 tablet (40 mg total) by mouth daily. 30 tablet 6  . simethicone (MYLICON) 0000000 MG chewable tablet Chew 125 mg by mouth every 6 (six) hours as needed for flatulence.    . tacrolimus (PROGRAF) 1 MG capsule Take 4 mg by mouth 2 (two) times daily.    Marland Kitchen warfarin (COUMADIN) 1 MG tablet TAKE 2 TO 3 TABLETS BY MOUTH AS DIRECTED BY ANTICOAGULATION CLINIC 90 tablet 2   No current facility-administered medications for this visit.    Allergies:   Patient has no known allergies.   Social History:  The patient  reports that he has never smoked. He has never used smokeless tobacco. He reports current alcohol use. He reports that he does not use drugs.   Family History:  The patient's family history includes Cancer in his father; Diabetes in his mother; Hyperlipidemia in his mother.   ROS:  Please see the history of present illness.   Otherwise, review of systems is positive for none.   All other systems are reviewed and negative.   PHYSICAL EXAM: VS:  BP 108/66   Pulse 62   Ht '6\' 1"'$  (1.854 m)   Wt 194 lb (88 kg)   SpO2 97%   BMI 25.60 kg/m  , BMI [name] mass index is 25.6 kg/m. GEN: Well nourished, well developed, in no acute distress  HEENT: normal  Neck: no JVD, carotid bruits, or masses Cardiac: RRR; no murmurs, rubs, or gallops,no edema  Respiratory:  clear to auscultation bilaterally, normal work of breathing GI: soft, nontender, nondistended, + BS MS: no deformity or atrophy  Skin: warm and dry Neuro:  Strength and sensation are intact Psych: euthymic mood, full affect  EKG:  EKG is not ordered today. Personal review of the ekg ordered 06/24/20 shows sinus rhythm, LVH, inferior infarct, anterior infarct  Recent Labs: 12/07/2019: Magnesium 2.8 05/15/2020: BUN 44; Creatinine, Ser 3.34; Hemoglobin 11.8; Platelets  206; Potassium 4.0; Sodium 140; TSH 1.341 05/23/2020: ALT 40    Lipid Panel     Component Value Date/Time   CHOL 112 05/23/2020 1426   TRIG 141 05/23/2020 1426   HDL 48 05/23/2020 1426   CHOLHDL 2.3 05/23/2020 1426   VLDL 28 05/23/2020 1426   LDLCALC 36 05/23/2020 1426     Wt Readings from Last 3 Encounters:  06/24/20 194 lb (88 kg)  06/24/20 194 lb 6.4 oz (88.2 kg)  05/15/20 197 lb 12.8 oz (89.7 kg)      Other studies Reviewed: Additional studies/ records that were reviewed today include: TTE 06/19/2020 Review of the above records today demonstrates:  1. Mid-apical anteroseptal, inferoseptal, apical inferior, apical  inferolateral and apical hypokinesis. Left ventricular ejection fraction,  by estimation, is 40 to 45%. The  left ventricle has mildly decreased  function. The left ventricle demonstrates  regional wall motion abnormalities (see scoring diagram/findings for  description). There is mild left ventricular hypertrophy of the posterior  segment. Left ventricular diastolic parameters are consistent with Grade  II diastolic dysfunction  (pseudonormalization). Elevated left ventricular end-diastolic pressure.  2. Right ventricular systolic function is normal. The right ventricular  size is normal.  3. The mitral valve is normal in structure. Trivial mitral valve  regurgitation. No evidence of mitral stenosis.  4. The aortic valve is normal in structure. Aortic valve regurgitation is  not visualized. No aortic stenosis is present.  5. The inferior vena cava is normal in size with greater than 50%  respiratory variability, suggesting right atrial pressure of 3 mmHg.    ASSESSMENT AND PLAN:  1.  Paroxysmal atrial fibrillation: Currently on amiodarone and warfarin.  High risk medication monitoring.  CHA2DS2-VASc of at least 3.  At this point, he is comfortable being on amiodarone.  He would like to avoid ablation for now.  He may agree to ablation in the future.  He  has agreed to monitoring with amiodarone every 6 months with TSH and hepatic panel.  I Casey [name] let his heart failure cardiologist to adjust his amiodarone.  I Casey [name] see him back on an as-needed basis if he determines that ablation is reasonable.  Case discussed with heart failure cardiology  2.  Chronic systolic heart failure due to ischemic cardiomyopathy: Ejection fraction 30 to 35%.  Currently on Farxiga, carvedilol, BiDil.  Repeat echo shows an ejection fraction of 40 to 45%.  3.  Coronary artery disease: Status post drug-eluting stent to the LAD.  Currently on Plavix.  Plan per primary cardiology.    4.  CKD stage III: Status post renal transplant.     Current medicines are reviewed at length with the patient today.   The patient does not have concerns regarding his medicines.  The following changes were made today: None  Labs/ tests ordered today include:  No orders of the defined types were placed in this encounter.    Disposition:   FU with Tiwanna Tuch as needed months  Signed, Radwan Cowley Meredith Leeds, MD  06/24/2020 2:33 PM     [facility] [address] [place] [address] [phone] (office) [phone] (fax)

## 2020-06-24 NOTE — Progress Notes (Signed)
Advanced Heart Failure Clinic Progress Note  PCP: Dion Body, MD Cardiology: Dr. Aundra Dubin  59 y.o. with history of renal transplant in 2011, CAD s/p anterior MI, and ischemic cardiomyopathy presents for followup of CHF and CAD.  Patient had his renal transplant at [facility], and has been followed by [facility] as well as [facility] since that time.  He had been doing well until 10/21.  In early 10/21, he developed chest pain.  He did not go immediately to the ER, he presented when the pain had continued for > 1 day.  He was found to have acute anterior MI with late presentation. He went for cath, LAD was occluded and there was severe diffuse disease in the RCA as well as severe disease in the LCx system.  He had DES to the LAD.  He subsequently developed cardiogenic shock as well as suspected septic shock, possible from gut source.  Abdominal imaging showed profound ileus.  He was taken to the OR for Impella 5.5 placement, but this was deferred due to the finding of LV thrombus on intra-op TEE.  He also developed atrial fibrillation with RVR, controlled by amiodarone and eventually converted back to NSR.  He was maintained on milrinone 0.25 with gradual improvement.  However, he developed AKI, likely due to contrast as well as cardiorenal. Creatinine went up to 5.  Creatinine gradually improved down to 3.38 at discharge, and we were able to discontinue milrinone.  Ileus also gradually resolved.  Echo in the hospital showed EF 20-25%. He initially wore a Lifevest but has now sent it back.   Echo in 1/22 showed EF 30-35%, periapical akinesis, no LV thrombus, normal RV.  He saw EP to discuss ICD and atrial fibrillation ablation, but he was not ready for this yet.   Had clinic f/u w/ Dr. Aundra Dubin 2/22 and symptomatically was doing quite well. Was enrolled in cardiac rehab and able to run for a short distance on his treadmill w/o significant exertional dyspnea.  Denied chest pain, orthopnea/PND  and palpitations.  Given lack of CP and SCr persistently > 2, it was decided not to persure staged PCI of LCx. Continued medical therapy of CAD elected. GDMT further titrated.    He had repeat echo last week, on 06/19/20, and LVEF has improved to 40-45%. RV ok. He has f/u w/ Dr. Curt Bears later today to discuss afib ablation.   He returns to clinic today for f/u for CHF. Doing well. Denies CP and dyspnea. Still enrolled in CR. NYHA Class I-II. EKG shows NSR 61 bpm. BP well controlled. Volume status stable. Wt down 4 lb from prior visit. No complaints. INRs followed by coumadin clinic. Denies abnormal bleeding.    Labs (10/21): K 4.3, creatinine 3.38 Labs (11/21): K 4.7, creatinine 2.24, LDL 68, HDL 43, hgb 11.9, LFTs normal Labs (12/21): K 3.9, creatinine 2.43 Labs (2/22): LDL 81, LFTs normal, K 3.7, creatinine 2.49 Labs (3/22): SCr 3.34, K 4.0, TSH normal  Labs (4/22): LDL 36, LFTs normal   ECG (personally reviewed): NSr 61 bpm   PMH: 1. Type 2 diabetes 2. Atrial fibrillation: Paroxysmal, on amiodarone.  3. Renal transplant 2011 [facility].  4. GERD 5. 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 CAD: Late presentation anterior MI in 10/21.  Cath with occluded LAD, long up to 99% mid-distal RCA stenosis, 95% mid-distal LCx, 80% OM3.  He had DES to LAD.   8. Chronic systolic CHF: Ischemic cardiomyopathy. Cardiogenic shock in 10/21  with MI.  - Echo (10/21): EF 20-25%, heavy smoke/early thrombus LV apex, RV normal.  - RHC (10/21, milrinone 0.25): mean RA 4, PA 22/4, mean PCWP 6, CI 2.06 Fick, CI 2.06 thermo.  - Echo (1/22): EF 30-35%, periapical akinesis, no LV thrombus, normal RV.  9. LV thrombus  SH: Married, nonsmoker, no ETOH.  Lives in [place]. 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   Family History  Problem Relation Age of Onset  . Diabetes Mother   . Hyperlipidemia Mother   . Cancer Father    ROS: all systems reviewed and negative except as per HPI.   Current Outpatient Medications   Medication Sig Dispense Refill  . amiodarone (PACERONE) 200 MG tablet Take 1 tablet (200 mg total) by mouth daily. 30 tablet 6  . carvedilol (COREG) 25 MG tablet Take 1 tablet (25 mg total) by mouth 2 (two) times daily. 60 tablet 11  . clopidogrel (PLAVIX) 75 MG tablet Take 1 tablet (75 mg total) by mouth daily. 30 tablet 6  . dapagliflozin propanediol (FARXIGA) 10 MG TABS tablet Take 1 tablet (10 mg total) by mouth daily before breakfast. 90 tablet 3  . ezetimibe (ZETIA) 10 MG tablet Take 1 tablet (10 mg total) by mouth daily. 30 tablet 11  . glipiZIDE (GLUCOTROL) 10 MG tablet Take 10 mg by mouth 2 (two) times daily before a meal.    . isosorbide-hydrALAZINE (BIDIL) 20-37.5 MG tablet Take 2 tablets by mouth 3 (three) times daily. 180 tablet 6  . loratadine (CLARITIN) 10 MG tablet Take 10 mg by mouth daily as needed for allergies.    . nitroGLYCERIN (NITROSTAT) 0.4 MG SL tablet Place 1 tablet (0.4 mg total) under the tongue every 5 (five) minutes as needed for chest pain. 100 tablet 3  . pantoprazole (PROTONIX) 40 MG tablet TAKE 1 TABLET BY MOUTH EVERY DAY 90 tablet 1  . predniSONE (DELTASONE) 5 MG tablet Take 1 tablet (5 mg total) by mouth daily with breakfast.    . rosuvastatin (CRESTOR) 40 MG tablet Take 1 tablet (40 mg total) by mouth daily. 30 tablet 6  . simethicone (MYLICON) 0000000 MG chewable tablet Chew 125 mg by mouth every 6 (six) hours as needed for flatulence.    . tacrolimus (PROGRAF) 1 MG capsule Take 4 mg by mouth 2 (two) times daily.    Marland Kitchen warfarin (COUMADIN) 1 MG tablet TAKE 2 TO 3 TABLETS BY MOUTH AS DIRECTED BY ANTICOAGULATION CLINIC 90 tablet 2   No current facility-administered medications for this encounter.   BP 130/78   Pulse 62   Wt 88.2 kg (194 lb 6.4 oz)   SpO2 98%   BMI 25.65 kg/m  PHYSICAL EXAM: General:  Well appearing. No respiratory difficulty HEENT: normal Neck: supple. no JVD. Carotids 2+ bilat; no bruits. No lymphadenopathy or thyromegaly  appreciated. Cor: PMI nondisplaced. Regular rate & rhythm. No rubs, gallops or murmurs. Lungs: clear Abdomen: soft, nontender, nondistended. No hepatosplenomegaly. No bruits or masses. Good bowel sounds. Extremities: no cyanosis, clubbing, rash, edema Neuro: alert & oriented x 3, cranial nerves grossly intact. moves all 4 extremities w/o difficulty. Affect pleasant.    Assessment/Plan: 1. CAD: S/p late presentation anterior MI in 10/21 with DES to LAD.  He has residual severe disease in the RCA which is not revascularizable.   He has 95% mid-distal LCx stenosis and 80% OM3 stenosis that could potentially be intervened upon.  Stable w/o anginal symptomatology.  - Given CKD w/ SCr >2.0 and lack  of CP, will continue medical therapy for residual disease  - Continue Plavix for up to 1 year post-PCI.  He is now off ASA as he is also on warfarin.  Can stop Plavix at 6 months if he develops issues with bleeding.  - Continue statin, good lipids in 4/21. LDL 36 mg/dL. LFTs WNL  2. Atrial fibrillation: Paroxysmal.  He is in NSR today on EKG. Denies breakthrough symptoms w/ amio   - Continue amiodarone 200 mg daily.  Recent TFTs and LFTs were normal. Will need regular eye exam.  - Continue warfarin. Check CBC today. INR followed by coumadin clinic  - Recommended atrial fibrillation ablation so he can safely stop amiodarone. Has appt w/ Dr. Curt Bears today to further discuss.  3. Chronic systolic CHF: Ischemic cardiomyopathy.  Echo in 10/21 with EF 20-25%.  Echo in 1/22 showed EF 30-35% with regional wall motion abnormalities.  Echo 06/19/20 LVEF improved to 40-45%. RV ok.  Not volume overloaded on exam, NYHA class I-II.   - No ARB/ARNi, spiro nor dig w/ CKD  - He can continue to use torsemide prn.  - Continue Bidil 2 tabs tid.   - Continue Coreg 25 mg bid.  - Continue Farxiga 10 mg daily.   - Check BMP today  - EF now out of range for ICD  4. LV thrombus: No thrombus on 1/22 echo.   - Now on warfarin.  INRs followed by coumadin clinic  5. 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  - Check BMP today  6. Type 2 diabetes: On Farxiga.   - BMP today  Keep f/u w/ Dr. Curt Bears this afternoon to discuss Afib ablation. F/u w/ Dr. Aundra Dubin in 3 months   Casey Jester, PA-C  06/24/2020

## 2020-06-24 NOTE — Patient Instructions (Signed)
No medication changes were made today  Your physician recommends that you schedule a follow-up appointment in: 3 months  .If you have any questions or concerns before your next appointment please send [place] a message through [place] or call our office at [phone].    TO LEAVE A MESSAGE FOR THE NURSE SELECT OPTION 2, PLEASE LEAVE A MESSAGE INCLUDING: . YOUR NAME . DATE OF BIRTH . CALL BACK NUMBER . REASON FOR CALL**this is important as we prioritize the call backs  [place] AS LONG AS YOU CALL BEFORE 4:00 PM At the [facility], you and your health needs are our priority. As part of our continuing mission to provide you with exceptional heart care, we have created designated Provider Care Teams. These Care Teams include your primary Cardiologist (physician) and Advanced Practice Providers (APPs- Physician Assistants and Nurse Practitioners) who all work together to provide you with the care you need, when you need it.   You may see any of the following providers on your designated Care Team at your next follow up: Marland Kitchen Dr Glori Bickers . Dr Loralie Champagne . Dr Vickki Muff . Darrick Grinder, NP . Lyda Jester, [place] . Audry Riles, PharmD   Please be sure to bring in all your medications bottles to every appointment.

## 2020-06-24 NOTE — Patient Instructions (Signed)
Medication Instructions:  Your physician recommends that you continue on your current medications as directed. Please refer to the Current Medication list given to you today.  *If you need a refill on your cardiac medications before your next appointment, please call your pharmacy*   Lab Work: None ordered   Testing/Procedures: None ordered   Follow-Up: At [facility], you and your health needs are our priority.  As part of our continuing mission to provide you with exceptional heart care, we have created designated Provider Care Teams.  These Care Teams include your primary Cardiologist (physician) and Advanced Practice Providers (APPs -  Physician Assistants and Nurse Practitioners) who all work together to provide you with the care you need, when you need it.  Your next appointment:    as needed  The format for your next appointment:   In Person  Provider:   Allegra Lai, MD    Thank you for choosing [facility]!!   Trinidad Curet, RN [phone]   Other Instructions  If you decide you would like to proceed with an ablation we will be more than happy to see you again and schedule this......Marland Kitchen

## 2020-06-25 DIAGNOSIS — I213 ST elevation (STEMI) myocardial infarction of unspecified site: Secondary | ICD-10-CM

## 2020-06-25 DIAGNOSIS — Z955 Presence of coronary angioplasty implant and graft: Secondary | ICD-10-CM | POA: Diagnosis not present

## 2020-06-25 NOTE — Progress Notes (Signed)
Daily Session Note  Patient Details  Name: Casey [name]. MRN: [id number] Date of Birth: [birth year] Referring Provider:   Flowsheet Row Cardiac Rehab from 01/30/2020 in [facility]  Referring Provider Loralie Champagne MD      Encounter Date: 06/25/2020  Check In:  Session Check In - 06/25/20 0923      Check-In   Supervising physician immediately available to respond to emergencies See telemetry face sheet for immediately available ER MD    Location [facility]-Cardiac & Pulmonary Rehab    Staff Present Birdie Sons, MPA, RN;Amanda Oletta Darter, BA, ACSM CEP, Exercise Physiologist;Kara Eliezer Bottom, MS Exercise Physiologist    Virtual Visit No    Medication changes reported     No    Fall or balance concerns reported    No    Warm-up and Cool-down Performed on first and last piece of equipment    Resistance Training Performed Yes    VAD Patient? No    PAD/SET Patient? No      Pain Assessment   Currently in Pain? No/denies              Social History   Tobacco Use  Smoking Status Never Smoker  Smokeless Tobacco Never Used    Goals Met:  Independence with exercise equipment Exercise tolerated well Personal goals reviewed No report of cardiac concerns or symptoms Strength training completed today  Goals Unmet:  Not Applicable  Comments: Pt able to follow exercise prescription today without complaint.  Will continue to monitor for progression.    Dr. Emily Filbert is Medical Director for [facility].

## 2020-06-27 ENCOUNTER — Other Ambulatory Visit: Payer: Self-pay

## 2020-06-27 DIAGNOSIS — Z955 Presence of coronary angioplasty implant and graft: Secondary | ICD-10-CM | POA: Diagnosis not present

## 2020-06-27 DIAGNOSIS — I213 ST elevation (STEMI) myocardial infarction of unspecified site: Secondary | ICD-10-CM

## 2020-06-27 NOTE — Progress Notes (Signed)
Daily Session Note  Patient Details  Name: Casey [name]. MRN: [id number] Date of Birth: [birth year] Referring Provider:   Flowsheet Row Cardiac Rehab from 01/30/2020 in [facility] Cardiac [facility] Re[facility]  Referring Provider Loralie Champagne MD      Encounter Date: 06/27/2020  Check In:  Session Check In - 06/27/20 0915      Check-In   Supervising physician immediately available to respond to emergencies See telemetry face sheet for immediately available ER MD    Location [facility]-Cardiac & Pulmonary Rehab    Staff Present Birdie Sons, MPA, Elveria Rising, BA, ACSM CEP, Exercise Physiologist;Saretta Dahlem Amedeo Plenty, BS, ACSM CEP, Exercise Physiologist    Virtual Visit No    Medication changes reported     No    Fall or balance concerns reported    No    Warm-up and Cool-down Performed on first and last piece of equipment    Resistance Training Performed Yes    VAD Patient? No    PAD/SET Patient? No      Pain Assessment   Currently in Pain? No/denies              Social History   Tobacco Use  Smoking Status Never Smoker  Smokeless Tobacco Never Used    Goals Met:  Independence with exercise equipment Exercise tolerated well No report of cardiac concerns or symptoms Strength training completed today  Goals Unmet:  Not Applicable  Comments: Pt able to follow exercise prescription today without complaint.  Will continue to monitor for progression.    Dr. Emily Filbert is Medical Director for [facility].

## 2020-06-28 NOTE — Patient Instructions (Signed)
Discharge Patient Instructions  Patient Details  Name: Casey [name]. MRN: [id number] Date of Birth: February 03, 1961 Referring Provider:  Wellington Hampshire, MD   Number of Visits: 39  Reason for Discharge:  Patient reached a stable level of exercise. Patient independent in their exercise. Patient has met program and personal goals.  Smoking History:  Social History   Tobacco Use  Smoking Status Never Smoker  Smokeless Tobacco Never Used    Diagnosis:  Status post coronary artery stent placement  ST elevation myocardial infarction (STEMI), unspecified artery (HCC)  Initial Exercise Prescription:  Initial Exercise Prescription - 01/30/20 1500      Date of Initial Exercise RX and Referring Provider   Date 01/30/20    Referring Provider Loralie Champagne MD      Treadmill   MPH 2.3    Grade 1    Minutes 15    METs 3.08      NuStep   Level 4    SPM 80    Minutes 15    METs 3      Elliptical   Level 1    Speed 3.5    Minutes 15    METs 3      Biostep-RELP   Level 4    SPM 50    Minutes 15    METs 3      Prescription Details   Frequency (times per week) 2    Duration Progress to 30 minutes of continuous aerobic without signs/symptoms of physical distress      Intensity   THRR 40-80% of Max Heartrate 109-144    Ratings of Perceived Exertion 11-13    Perceived Dyspnea 0-4      Progression   Progression Continue to progress workloads to maintain intensity without signs/symptoms of physical distress.      Resistance Training   Training Prescription Yes    Weight 4 lb    Reps 10-15           Discharge Exercise Prescription (Final Exercise Prescription Changes):  Exercise Prescription Changes - 06/19/20 1000      Response to Exercise   Blood Pressure (Admit) 120/64    Blood Pressure (Exercise) 120/64    Blood Pressure (Exit) 104/62    Heart Rate (Admit) 65 bpm    Heart Rate (Exercise) 72 bpm    Heart Rate (Exit) 62 bpm    Rating of Perceived  Exertion (Exercise) 12    Symptoms none    Duration Continue with 30 min of aerobic exercise without signs/symptoms of physical distress.    Intensity THRR unchanged      Progression   Progression Continue to progress workloads to maintain intensity without signs/symptoms of physical distress.    Average METs 3      Resistance Training   Training Prescription Yes    Weight 5 lb    Reps 10-15      Treadmill   MPH 3    Grade 1    Minutes 15    METs 3.71      Elliptical   Level 1    Minutes 15    METs 2.2           Functional Capacity:  6 Minute Walk    Row Name 01/30/20 1526 06/18/20 0930       6 Minute Walk   Phase Initial Discharge    Distance 1230 feet 1720 feet    Distance % Change -- 39.8 %  Distance Feet Change -- 490 ft    Walk Time 6 minutes 6 minutes    # of Rest Breaks 0 0    MPH 2.33 3.25    METS 3.71 4.34    RPE 9 12    Perceived Dyspnea  -- 0    VO2 Peak 12.97 15.2    Symptoms No No    Resting HR 75 bpm 66 bpm    Resting BP 128/70 120/66    Resting Oxygen Saturation  97 % 96 %    Exercise Oxygen Saturation  during 6 min walk 96 % 96 %    Max Ex. HR 101 bpm 93 bpm    Max Ex. BP 146/64 120/64    2 Minute Post BP 126/64 --          Nutrition:  Nutrition Therapy & Goals - 04/10/20 1502      Nutrition Therapy   Diet Heart healthy, low Na, diabetes friendly    Drug/Food Interactions Statins/Certain Fruits;Coumadin/Vit K    Protein (specify units) 70g    Fiber 30 grams    Whole Grain Foods 3 servings    Saturated Fats 12 max. grams    Fruits and Vegetables 8 servings/day    Sodium 1.5 grams      Personal Nutrition Goals   Nutrition Goal ST: add 1 serving of complex carbohydrates to meals, he would like to include sweet potatoes LT: include all macronutrients in meals    Comments green beans, boiled chicken, salmon, baked pork chop, spinach, broccoli, boiled eggs, salads, berries, oranges, vegetables. BG in am 120-125, A1C 7.2. He has  limited carbohydrates, recommended including some complex carbohydrates at least 1 exchange per meal. Pt is open to making changes. He reports speaking to a dietitian in the last two months and feeling like he has no questions at this time. Reviewed heart healthy and diabetes friendly eating.      Intervention Plan   Intervention Prescribe, educate and counsel regarding individualized specific dietary modifications aiming towards targeted core components such as weight, hypertension, lipid management, diabetes, heart failure and other comorbidities.;Nutrition handout(s) given to patient.    Expected Outcomes Short Term Goal: Understand basic principles of dietary content, such as calories, fat, sodium, cholesterol and nutrients.;Short Term Goal: A plan has been developed with personal nutrition goals set during dietitian appointment.;Long Term Goal: Adherence to prescribed nutrition plan.          Goals reviewed with patient; copy given to patient.

## 2020-06-30 ENCOUNTER — Other Ambulatory Visit (HOSPITAL_COMMUNITY): Payer: Self-pay | Admitting: Cardiology

## 2020-07-02 ENCOUNTER — Encounter: Payer: Self-pay | Admitting: *Deleted

## 2020-07-02 DIAGNOSIS — Z955 Presence of coronary angioplasty implant and graft: Secondary | ICD-10-CM

## 2020-07-02 DIAGNOSIS — I213 ST elevation (STEMI) myocardial infarction of unspecified site: Secondary | ICD-10-CM

## 2020-07-02 NOTE — Progress Notes (Signed)
Cardiac Individual Treatment Plan  Patient Details  Name: Casey [name]. MRN: [id number] Date of Birth: [birth year] Referring Provider:   Flowsheet Row Cardiac Rehab from 01/30/2020 in [facility] Cardiac and Pulmonary Rehab  Referring Provider Loralie Champagne MD      Initial Encounter Date:  Flowsheet Row Cardiac Rehab from 01/30/2020 in [facility] Cardiac and Pulmonary Rehab  Date 01/30/20      Visit Diagnosis: Status post coronary artery stent placement  ST elevation myocardial infarction (STEMI), unspecified artery ([place])  Patient's Home Medications on Admission:  Current Outpatient Medications:  .  amiodarone (PACERONE) 200 MG tablet, Take 1 tablet (200 mg total) by mouth daily., Disp: 30 tablet, Rfl: 6 .  carvedilol (COREG) 25 MG tablet, Take 1 tablet (25 mg total) by mouth 2 (two) times daily., Disp: 60 tablet, Rfl: 11 .  clopidogrel (PLAVIX) 75 MG tablet, Take 1 tablet (75 mg total) by mouth daily., Disp: 30 tablet, Rfl: 6 .  dapagliflozin propanediol (FARXIGA) 10 MG TABS tablet, Take 1 tablet (10 mg total) by mouth daily before breakfast., Disp: 90 tablet, Rfl: 3 .  ezetimibe (ZETIA) 10 MG tablet, Take 1 tablet (10 mg total) by mouth daily., Disp: 30 tablet, Rfl: 11 .  glipiZIDE (GLUCOTROL) 10 MG tablet, Take 10 mg by mouth 2 (two) times daily before a meal., Disp: , Rfl:  .  isosorbide-hydrALAZINE (BIDIL) 20-37.5 MG tablet, Take 2 tablets by mouth 3 (three) times daily., Disp: 180 tablet, Rfl: 6 .  loratadine (CLARITIN) 10 MG tablet, Take 10 mg by mouth daily as needed for allergies., Disp: , Rfl:  .  nitroGLYCERIN (NITROSTAT) 0.4 MG SL tablet, Place 1 tablet (0.4 mg total) under the tongue every 5 (five) minutes as needed for chest pain., Disp: 100 tablet, Rfl: 3 .  pantoprazole (PROTONIX) 40 MG tablet, TAKE 1 TABLET BY MOUTH EVERY DAY, Disp: 90 tablet, Rfl: 1 .  predniSONE (DELTASONE) 5 MG tablet, Take 1 tablet (5 mg total) by mouth daily with breakfast., Disp: , Rfl:  .   rosuvastatin (CRESTOR) 40 MG tablet, Take 1 tablet (40 mg total) by mouth daily., Disp: 30 tablet, Rfl: 6 .  simethicone (MYLICON) 222 MG chewable tablet, Chew 125 mg by mouth every 6 (six) hours as needed for flatulence., Disp: , Rfl:  .  tacrolimus (PROGRAF) 1 MG capsule, Take 4 mg by mouth 2 (two) times daily., Disp: , Rfl:  .  warfarin (COUMADIN) 1 MG tablet, TAKE 2 TO 3 TABLETS BY MOUTH AS DIRECTED BY ANTICOAGULATION CLINIC, Disp: 90 tablet, Rfl: 2  Past Medical History: Past Medical History:  Diagnosis Date  . CHF (congestive heart failure) ([place])   . Chronic kidney disease 04/2009   Kidney Transplant  . Diabetes mellitus   . GERD (gastroesophageal reflux disease)    as needed reflux  . Heart attack ([place]) 02/02/2020  . Hypertension   . 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    Tobacco Use: Social History   Tobacco Use  Smoking Status Never Smoker  Smokeless Tobacco Never Used    Labs: Recent Review Flowsheet Data    Labs for ITP Cardiac and Pulmonary Rehab Latest Ref Rng & Units 12/13/2019 12/14/2019 12/15/2019 12/26/2019 05/23/2020   Cholestrol 0 - 200 mg/dL - - - 147 112   LDLCALC 0 - 99 mg/dL - - - 68 36   HDL >40 mg/dL - - - 43 48   Trlycerides <150 mg/dL - - - 181(H)  141   Hemoglobin A1c 4.8 - 5.6 % - - - - -   PHART 7.350 - 7.450 - - - - -   PCO2ART 32.0 - 48.0 mmHg - - - - -   HCO3 20.0 - 28.0 mmol/L - - - - -   TCO2 22 - 32 mmol/L - - - - -   ACIDBASEDEF 0.0 - 2.0 mmol/L - - - - -   O2SAT % 70.7 68.9 72.2 - -       Exercise Target Goals: Exercise Program Goal: Individual exercise prescription set using results from initial 6 min walk test and THRR while considering  patient's activity barriers and safety.   Exercise Prescription Goal: Initial exercise prescription builds to 30-45 minutes a day of aerobic activity, 2-3 days per week.  Home exercise guidelines will be given to patient during program as part of exercise prescription  that the participant will acknowledge.   Education: Aerobic Exercise: - Group verbal and visual presentation on the components of exercise prescription. Introduces F.I.T.T principle from ACSM for exercise prescriptions.  Reviews F.I.T.T. principles of aerobic exercise including progression. Written material given at graduation. Flowsheet Row Cardiac Rehab from 06/27/2020 in [facility] Cardiac and Pulmonary Rehab  Education need identified 01/30/20  Date 06/06/20  Educator [place]  Instruction Review Code 1- Verbalizes Understanding      Education: Resistance Exercise: - Group verbal and visual presentation on the components of exercise prescription. Introduces F.I.T.T principle from ACSM for exercise prescriptions  Reviews F.I.T.T. principles of resistance exercise including progression. Written material given at graduation. Flowsheet Row Cardiac Rehab from 06/27/2020 in [facility] Cardiac and Pulmonary Rehab  Date 02/08/20  Educator [facility]  Instruction Review Code 1- [employer] Understanding       Education: Exercise & Equipment Safety: - Individual verbal instruction and demonstration of equipment use and safety with use of the equipment. Flowsheet Row Cardiac Rehab from 06/27/2020 in [facility] Cardiac and Pulmonary Rehab  Date 01/30/20  Educator [facility]  Instruction Review Code 1- Verbalizes Understanding      Education: Exercise Physiology & General Exercise Guidelines: - Group verbal and written instruction with models to review the exercise physiology of the cardiovascular system and associated critical values. Provides general exercise guidelines with specific guidelines to those with heart or lung disease.  Flowsheet Row Cardiac Rehab from 06/27/2020 in [facility] Cardiac and Pulmonary Rehab  Date 03/28/20  Educator [facility]  Instruction Review Code 1- Verbalizes Understanding      Education: Flexibility, Balance, Mind/Body Relaxation: - Group verbal and visual presentation with interactive activity on the  components of exercise prescription. Introduces F.I.T.T principle from ACSM for exercise prescriptions. Reviews F.I.T.T. principles of flexibility and balance exercise training including progression. Also discusses the mind body connection.  Reviews various relaxation techniques to help reduce and manage stress (i.e. Deep breathing, progressive muscle relaxation, and visualization). Balance handout provided to take home. Written material given at graduation. Flowsheet Row Cardiac Rehab from 06/27/2020 in [facility] Cardiac and Pulmonary Rehab  Date 04/18/20  Educator [facility]  Instruction Review Code 1- Verbalizes Understanding      Activity Barriers & Risk Stratification:  Activity Barriers & Cardiac Risk Stratification - 01/30/20 1527      Activity Barriers & Cardiac Risk Stratification   Activity Barriers Other (comment);Deconditioning    Comments Life Vest; Renal Transplant, L shoulder tingly    Cardiac Risk Stratification High           6 Minute Walk:  6 Minute Walk  [place] Name 01/30/20 1526 06/18/20 0930       6 Minute Walk   Phase Initial Discharge    Distance 1230 feet 1720 feet    Distance % Change -- 39.8 %    Distance Feet Change -- 490 ft    Walk Time 6 minutes 6 minutes    # of Rest Breaks 0 0    MPH 2.33 3.25    METS 3.71 4.34    RPE 9 12    Perceived Dyspnea  -- 0    VO2 Peak 12.97 15.2    Symptoms No No    Resting HR 75 bpm 66 bpm    Resting BP 128/70 120/66    Resting Oxygen Saturation  97 % 96 %    Exercise Oxygen Saturation  during 6 min walk 96 % 96 %    Max Ex. HR 101 bpm 93 bpm    Max Ex. BP 146/64 120/64    2 Minute Post BP 126/64 --           Oxygen Initial Assessment:   Oxygen Re-Evaluation:   Oxygen Discharge (Final Oxygen Re-Evaluation):   Initial Exercise Prescription:  Initial Exercise Prescription - 01/30/20 1500      Date of Initial Exercise RX and Referring Provider   Date 01/30/20    Referring Provider Loralie Champagne MD       Treadmill   MPH 2.3    Grade 1    Minutes 15    METs 3.08      NuStep   Level 4    SPM 80    Minutes 15    METs 3      Elliptical   Level 1    Speed 3.5    Minutes 15    METs 3      Biostep-RELP   Level 4    SPM 50    Minutes 15    METs 3      Prescription Details   Frequency (times per week) 2    Duration Progress to 30 minutes of continuous aerobic without signs/symptoms of physical distress      Intensity   THRR 40-80% of Max Heartrate 109-144    Ratings of Perceived Exertion 11-13    Perceived Dyspnea 0-4      Progression   Progression Continue to progress workloads to maintain intensity without signs/symptoms of physical distress.      Resistance Training   Training Prescription Yes    Weight 4 lb    Reps 10-15           Perform Capillary Blood Glucose checks as needed.  Exercise Prescription Changes:  Exercise Prescription Changes    Row Name 01/30/20 1500 02/13/20 1200 03/07/20 0900 03/13/20 1400 03/27/20 1500     Response to Exercise   Blood Pressure (Admit) 128/70 142/76 -- 108/62 110/62   Blood Pressure (Exercise) 146/64 152/64 -- 114/66 118/62   Blood Pressure (Exit) 126/64 118/60 -- 110/60 112/64   Heart Rate (Admit) 75 bpm 73 bpm -- 69 bpm 83 bpm   Heart Rate (Exercise) 101 bpm 92 bpm -- 90 bpm 98 bpm   Heart Rate (Exit) 76 bpm 76 bpm -- 75 bpm 71 bpm   Oxygen Saturation (Admit) 97 % -- -- -- --   Oxygen Saturation (Exercise) 96 % -- -- -- --   Rating of Perceived Exertion (Exercise) 9 12 -- 12 11   Symptoms none none -- none none   Comments  walk test results -- -- -- --   Duration -- -- -- Continue with 30 min of aerobic exercise without signs/symptoms of physical distress. Continue with 30 min of aerobic exercise without signs/symptoms of physical distress.   Intensity -- -- -- THRR unchanged THRR unchanged     Progression   Progression -- -- -- Continue to progress workloads to maintain intensity without signs/symptoms of physical  distress. Continue to progress workloads to maintain intensity without signs/symptoms of physical distress.   Average METs -- -- -- 2.7 3.55     Resistance Training   Training Prescription -- Yes -- Yes Yes   Weight -- 4 lb -- 4 lb 4 lb   Reps -- 10-15 -- 10-15 10-15     Interval Training   Interval Training -- -- -- No No     Treadmill   MPH -- 2.3 -- 2.4 --   Grade -- 1 -- 0.5 --   Minutes -- 15 -- 15 --   METs -- 3.08 -- 3 --     NuStep   Level -- 4 -- -- 4   SPM -- 80 -- -- 80   Minutes -- 15 -- -- 15   METs -- 2.1 -- -- 4     T5 Nustep   Level -- -- -- 4 --   Minutes -- -- -- 15 --   METs -- -- -- 2.4 --     Biostep-RELP   Level -- -- -- -- 4   SPM -- -- -- -- 50   Minutes -- -- -- -- 15   METs -- -- -- -- 4     Home Exercise Plan   Plans to continue exercise at -- -- Home (comment)  walking, weights, staff videos Home (comment)  walking, weights, staff videos Home (comment)  walking, weights, staff videos   Frequency -- -- Add 2 additional days to program exercise sessions. Add 2 additional days to program exercise sessions. Add 2 additional days to program exercise sessions.   Initial Home Exercises Provided -- -- 03/07/20 03/07/20 03/07/20   Row Name 04/10/20 0700 04/23/20 0800 05/06/20 1500 05/22/20 1200 06/05/20 1300     Response to Exercise   Blood Pressure (Admit) 108/60 114/54 102/64 102/62 102/58   Blood Pressure (Exercise) 112/76 120/60 122/60 122/58 94/54   Blood Pressure (Exit) 108/52 124/64 104/54 102/58 98/60   Heart Rate (Admit) 64 bpm 82 bpm 73 bpm 58 bpm 64 bpm   Heart Rate (Exercise) 94 bpm 88 bpm 83 bpm 84 bpm 78 bpm   Heart Rate (Exit) 71 bpm 65 bpm 66 bpm 63 bpm 61 bpm   Rating of Perceived Exertion (Exercise) 12 12 12 12 12    Symptoms none none none none none   Duration Continue with 30 min of aerobic exercise without signs/symptoms of physical distress. Continue with 30 min of aerobic exercise without signs/symptoms of physical distress.  Continue with 30 min of aerobic exercise without signs/symptoms of physical distress. Continue with 30 min of aerobic exercise without signs/symptoms of physical distress. Continue with 30 min of aerobic exercise without signs/symptoms of physical distress.   Intensity THRR unchanged THRR unchanged THRR unchanged THRR unchanged THRR unchanged     Progression   Progression Continue to progress workloads to maintain intensity without signs/symptoms of physical distress. Continue to progress workloads to maintain intensity without signs/symptoms of physical distress. Continue to progress workloads to maintain intensity without signs/symptoms of physical distress. Continue to progress workloads  to maintain intensity without signs/symptoms of physical distress. Continue to progress workloads to maintain intensity without signs/symptoms of physical distress.   Average METs 2.85 2.95 2.83 2.95 3     Resistance Training   Training Prescription Yes Yes Yes Yes Yes   Weight 4 lb 5 lb 5 lb 5 lb 5 lb   Reps 10-15 10-15 10-15 10-15 10-15     Interval Training   Interval Training No -- No -- --     Treadmill   MPH 3 -- 2.3 3 3    Grade 0.5 -- 1 1 1    Minutes 15 -- 15 15 15    METs 3.5 -- 3.08 3.71 3.71     NuStep   Level 4 -- -- -- --   Minutes 15 -- -- -- --   METs 2.6 -- -- -- --     Elliptical   Level 1 -- 2 1 1    Speed 3.5 -- 3.5 3.5 3.5   Minutes 15 -- 15 15 15    METs 2.2 -- 2.4 2.2 2.4     REL-XR   Level -- -- 2 -- --   Minutes -- -- 15 -- --   METs -- -- 3 -- --     Biostep-RELP   Level 4 -- -- -- --   Minutes 15 -- -- -- --   METs 3 -- -- -- --     Home Exercise Plan   Plans to continue exercise at Home (comment)  walking, weights, staff videos -- Home (comment)  walking, weights, staff videos Home (comment)  walking, weights, staff videos Home (comment)  walking, weights, staff videos   Frequency Add 2 additional days to program exercise sessions. -- Add 2 additional days to  program exercise sessions. Add 2 additional days to program exercise sessions. Add 2 additional days to program exercise sessions.   Initial Home Exercises Provided 03/07/20 -- 03/07/20 03/07/20 03/07/20   Row Name 06/19/20 1000             Response to Exercise   Blood Pressure (Admit) 120/64       Blood Pressure (Exercise) 120/64       Blood Pressure (Exit) 104/62       Heart Rate (Admit) 65 bpm       Heart Rate (Exercise) 72 bpm       Heart Rate (Exit) 62 bpm       Rating of Perceived Exertion (Exercise) 12       Symptoms none       Duration Continue with 30 min of aerobic exercise without signs/symptoms of physical distress.       Intensity THRR unchanged               Progression   Progression Continue to progress workloads to maintain intensity without signs/symptoms of physical distress.       Average METs 3               Resistance Training   Training Prescription Yes       Weight 5 lb       Reps 10-15               Treadmill   MPH 3       Grade 1       Minutes 15       METs 3.71               Elliptical   Level 1  Minutes 15       METs 2.2              Exercise Comments:  Exercise Comments    Row Name 02/08/20 0932 03/26/20 5625         Exercise Comments First full day of exercise!  Patient was oriented to gym and equipment including functions, settings, policies, and procedures.  Patient's individual exercise prescription and treatment plan were reviewed.  All starting workloads were established based on the results of the 6 minute walk test done at initial orientation visit.  The plan for exercise progression was also introduced and progression will be customized based on patient's performance and goals. Returns today after a death in his family             Exercise Goals and Review:  Exercise Goals    Row Name 01/30/20 1529             Exercise Goals   Increase Physical Activity Yes       Intervention Provide advice, education,  support and counseling about physical activity/exercise needs.;Develop an individualized exercise prescription for aerobic and resistive training based on initial evaluation findings, risk stratification, comorbidities and participant's personal goals.       Expected Outcomes Short Term: Attend rehab on a regular basis to increase amount of physical activity.;Long Term: Add in home exercise to make exercise part of routine and to increase amount of physical activity.;Long Term: Exercising regularly at least 3-5 days a week.       Increase Strength and Stamina Yes       Intervention Provide advice, education, support and counseling about physical activity/exercise needs.;Develop an individualized exercise prescription for aerobic and resistive training based on initial evaluation findings, risk stratification, comorbidities and participant's personal goals.       Expected Outcomes Short Term: Increase workloads from initial exercise prescription for resistance, speed, and METs.;Short Term: Perform resistance training exercises routinely during rehab and add in resistance training at home;Long Term: Improve cardiorespiratory fitness, muscular endurance and strength as measured by increased METs and functional capacity (6MWT)       Able to understand and use rate of perceived exertion (RPE) scale Yes       Intervention Provide education and explanation on how to use RPE scale       Expected Outcomes Long Term:  Able to use RPE to guide intensity level when exercising independently;Short Term: Able to use RPE daily in rehab to express subjective intensity level       Able to understand and use Dyspnea scale Yes       Intervention Provide education and explanation on how to use Dyspnea scale       Expected Outcomes Long Term: Able to use Dyspnea scale to guide intensity level when exercising independently;Short Term: Able to use Dyspnea scale daily in rehab to express subjective sense of shortness of breath  during exertion       Knowledge and understanding of Target Heart Rate Range (THRR) Yes       Intervention Provide education and explanation of THRR including how the numbers were predicted and where they are located for reference       Expected Outcomes Short Term: Able to state/look up THRR;Short Term: Able to use daily as guideline for intensity in rehab;Long Term: Able to use THRR to govern intensity when exercising independently       Able to check pulse independently Yes  Intervention Provide education and demonstration on how to check pulse in carotid and radial arteries.;Review the importance of being able to check your own pulse for safety during independent exercise       Expected Outcomes Short Term: Able to explain why pulse checking is important during independent exercise;Long Term: Able to check pulse independently and accurately       Understanding of Exercise Prescription Yes       Intervention Provide education, explanation, and written materials on patient's individual exercise prescription       Expected Outcomes Short Term: Able to explain program exercise prescription;Long Term: Able to explain home exercise prescription to exercise independently              Exercise Goals Re-Evaluation :  Exercise Goals Re-Evaluation    Row Name 02/08/20 0932 03/07/20 0931 03/13/20 1407 03/26/20 0928 04/10/20 0738     Exercise Goal Re-Evaluation   Exercise Goals Review Increase Physical Activity;Able to understand and use rate of perceived exertion (RPE) scale;Knowledge and understanding of Target Heart Rate Range (THRR);Understanding of Exercise Prescription;Increase Strength and Stamina;Able to understand and use Dyspnea scale;Able to check pulse independently Increase Physical Activity;Increase Strength and Stamina;Understanding of Exercise Prescription Increase Physical Activity;Increase Strength and Stamina;Understanding of Exercise Prescription Increase Physical  Activity;Increase Strength and Stamina Increase Physical Activity;Increase Strength and Stamina;Understanding of Exercise Prescription   Comments Reviewed RPE and dyspnea scales, THR and program prescription with pt today.  Pt voiced understanding and was given a copy of goals to take home. Corydon returned today after being in [place] and finallizing things after his mother's passing.  He was able to exercise while he was up there.  He was moving furniture and walking 4-5 blocks each day.  He does feel like his strength and stamina are starting to reocover some.  Reviewed home exercise with pt today.  Pt plans to walking and weights at home for exercise.  We also talked about using the staff videos for bad weather days. Reviewed THR, pulse, RPE, sign and symptoms, pulse oximetery and when to call 911 or MD.  Also discussed weather considerations and indoor options.  Pt voiced understanding. Only one visit this month after his mother's passing.  He is back in [place] this week again.  He is planning to try to exercise more up there this time. Thornton did do some push ups and squats while he was away.  He is gald to be back to [name]! Alik is doing well back in rehab.  He is coming regularly, he even tried out the elliptical and doing well on it.  He is up to 2.2 METs on it.  We will continue to monitor his progress.   Expected Outcomes Short: Use RPE daily to regulate intensity. Long: Follow program prescription in THR. Short: Start to add in exercise at home Long: Continue to exercise independently Short: Return to regular attendance  Long: Continue to improve stamina Short:  get back to regular attendance Long:  improve overall stamina Short: Continue to improve on elliptical and get into home routine Long: Continue to improve stamina   Row Name 04/16/20 0932 04/23/20 0853 05/06/20 1554 05/16/20 0934 05/22/20 1250     Exercise Goal Re-Evaluation   Exercise Goals Review Increase Physical Activity;Increase  Strength and Stamina Increase Physical Activity;Increase Strength and Stamina Increase Physical Activity;Increase Strength and Stamina;Understanding of Exercise Prescription Increase Physical Activity;Increase Strength and Stamina;Understanding of Exercise Prescription Increase Physical Activity;Increase Strength and Stamina   Comments Konrad Penta  does some strength exercises and walking outside class.  We reviewed monitoring RPE, HR during exercise. Romel is progressing well and uses the elliptical some sessions.  He has improved MET level.  He has increased to 5 lb for strength training. Mauro is doing well in rehab.  He is up to 2.4 METs on the treadmill.  He has only been doing 2.3 mph on the treadmill, we will talk about moving back up to 3.  We will continue to monitor his progress. Nyaire is walking, doing push-ups, jumping jacks, squats using a chair, squats. He exercises every day, he also stretches. [employer] (15 minutes) Walking (1 mile - 30 minutes RPE 12) - he did some sprints last week. He does a warm up and cool down. Eual attends consistently and exercises at home.  Staff will encourage trying 6 lb for strength work.   Expected Outcomes Short: check HR during exercise Long: increase  MET level Short: continue to exercise consistently Long:  improve overall stamina Short: Increase treadmill back to 3 mph Long; continue to improve stamina Short: continue to exercise at home and attend rehab Long; continue to improve stamina Short: move up to 6 lb for strength Long:  increase strength and stamina   Row Name 06/05/20 1341 06/19/20 1010 06/25/20 0934         Exercise Goal Re-Evaluation   Exercise Goals Review Increase Physical Activity;Increase Strength and Stamina Increase Physical Activity;Increase Strength and Stamina Increase Physical Activity;Increase Strength and Stamina     Comments Chuong is at session 29.  We expect him to improve post 6MWT. Cornelia Copa improved 6MWT by 490 feet ! He reports  doing sit-ups and push-ups and walking. He will do this on the days he is not here. He will walk for 30 minutes (1 mile).     Expected Outcomes Short:improve post walk Long: complete HT program Short:complete HT Long:maintain exercise on his own Short:complete HT, do a warm up and cool down for aerobic exercise  Long:maintain exercise on his own            Discharge Exercise Prescription (Final Exercise Prescription Changes):  Exercise Prescription Changes - 06/19/20 1000      Response to Exercise   Blood Pressure (Admit) 120/64    Blood Pressure (Exercise) 120/64    Blood Pressure (Exit) 104/62    Heart Rate (Admit) 65 bpm    Heart Rate (Exercise) 72 bpm    Heart Rate (Exit) 62 bpm    Rating of Perceived Exertion (Exercise) 12    Symptoms none    Duration Continue with 30 min of aerobic exercise without signs/symptoms of physical distress.    Intensity THRR unchanged      Progression   Progression Continue to progress workloads to maintain intensity without signs/symptoms of physical distress.    Average METs 3      Resistance Training   Training Prescription Yes    Weight 5 lb    Reps 10-15      Treadmill   MPH 3    Grade 1    Minutes 15    METs 3.71      Elliptical   Level 1    Minutes 15    METs 2.2           Nutrition:  Target Goals: Understanding of nutrition guidelines, daily intake of sodium <1568m, cholesterol <2011m calories 30% from fat and 7% or less from saturated fats, daily to have 5 or more servings of fruits  and vegetables.  Education: All About Nutrition: -Group instruction provided by verbal, written material, interactive activities, discussions, models, and posters to present general guidelines for heart healthy nutrition including fat, fiber, MyPlate, the role of sodium in heart healthy nutrition, utilization of the nutrition label, and utilization of this knowledge for meal planning. Follow up email sent as well. Written material given at  graduation. Flowsheet Row Cardiac Rehab from 06/27/2020 in [facility] Cardiac and Pulmonary Rehab  Date 06/27/20  Educator [facility]  Instruction Review Code 1- Verbalizes Understanding      Biometrics:  Pre Biometrics - 01/30/20 1530      Pre Biometrics   Height 6' 1"  (1.854 m)    Weight 195 lb 11.2 oz (88.8 kg)    BMI (Calculated) 25.83    Single Leg Stand 30 seconds            Nutrition Therapy Plan and Nutrition Goals:  Nutrition Therapy & Goals - 04/10/20 1502      Nutrition Therapy   Diet Heart healthy, low Na, diabetes friendly    Drug/Food Interactions Statins/Certain Fruits;Coumadin/Vit K    Protein (specify units) 70g    Fiber 30 grams    Whole Grain Foods 3 servings    Saturated Fats 12 max. grams    Fruits and Vegetables 8 servings/day    Sodium 1.5 grams      Personal Nutrition Goals   Nutrition Goal ST: add 1 serving of complex carbohydrates to meals, he would like to include sweet potatoes LT: include all macronutrients in meals    Comments green beans, boiled chicken, salmon, baked pork chop, spinach, broccoli, boiled eggs, salads, berries, oranges, vegetables. BG in am 120-125, A1C 7.2. He has limited carbohydrates, recommended including some complex carbohydrates at least 1 exchange per meal. Pt is open to making changes. He reports speaking to a dietitian in the last two months and feeling like he has no questions at this time. Reviewed heart healthy and diabetes friendly eating.      Intervention Plan   Intervention Prescribe, educate and counsel regarding individualized specific dietary modifications aiming towards targeted core components such as weight, hypertension, lipid management, diabetes, heart failure and other comorbidities.;Nutrition handout(s) given to patient.    Expected Outcomes Short Term Goal: Understand basic principles of dietary content, such as calories, fat, sodium, cholesterol and nutrients.;Short Term Goal: A plan has been developed with  personal nutrition goals set during dietitian appointment.;Long Term Goal: Adherence to prescribed nutrition plan.           Nutrition Assessments:  MEDIFICTS Score Key:  ?70 Need to make dietary changes   40-70 Heart Healthy Diet  ? 40 Therapeutic Level Cholesterol Diet  Flowsheet Row Cardiac Rehab from 06/25/2020 in [facility] Cardiac and Pulmonary Rehab  Picture Your Plate Total Score on Admission 80  Picture Your Plate Total Score on Discharge 72     Picture Your Plate Scores:  <60 Unhealthy dietary pattern with much room for improvement.  41-50 Dietary pattern unlikely to meet recommendations for good health and room for improvement.  51-60 More healthful dietary pattern, with some room for improvement.   >60 Healthy dietary pattern, although there may be some specific behaviors that could be improved.    Nutrition Goals Re-Evaluation:  Nutrition Goals Re-Evaluation    [place] Name 03/07/20 [phone] 04/09/20 1250 04/16/20 0944 05/16/20 0938 06/25/20 0626     Goals   Nutrition Goal Meet with dietician Meet with dietician ST: add 1 serving of complex  carbohydrates to meals, he would like to include sweet potatoes LT: include all macronutrients in meals ST: talk to MD regarding warfarin (would need to eat consistent amount dark green vegetables) LT: continue with current changes ST: LT: continue with current changes   Comment Cornelia Copa missed dietician appointment with his mom's death.  He has now rescheduled with her. Cornelia Copa re-scheduled RD appointment - missed today's. He had met with me regarding nutrition last week and is still working on making changes, he has added some sweet potatoes. Will continue to check in. he reports eating more fruit - especially berries- and continuing with changes made. He has not made any other changes since we last spoke. He drinks diet sprite and green tea (hot). He would like to introduce more vegetable, however, he is on warfarin - discussed how  recommendations on warfarin are just to keep vitamin K rich foods consistent and let MD know this is something he is interested in. Provided education from the Academy of [employer] and [employer]it[employer] on vitmain K content of food and anticoagulents. He reports that he spoke to his doctor and increased the amount of greens and is doing well - he is consistent with his intake. He reports continuing with current changes. He has no questions or concerns at this time.   Expected Outcome Meet with dietician Meet with dietician ST: add 1 serving of complex carbohydrates to meals, he would like to include sweet potatoes LT: include all macronutrients in meals ST: talk to MD regarding warfarin (would need to eat consistent amount dark green vegetables) LT: continue with current changes ST: LT: continue with current changes          Nutrition Goals Discharge (Final Nutrition Goals Re-Evaluation):  Nutrition Goals Re-Evaluation - 06/25/20 0929      Goals   Nutrition Goal ST: LT: continue with current changes    Comment He reports that he spoke to his doctor and increased the amount of greens and is doing well - he is consistent with his intake. He reports continuing with current changes. He has no questions or concerns at this time.    Expected Outcome ST: LT: continue with current changes           Psychosocial: Target Goals: Acknowledge presence or absence of significant depression and/or stress, maximize coping skills, provide positive support system. Participant is able to verbalize types and ability to use techniques and skills needed for reducing stress and depression.   Education: Stress, Anxiety, and Depression - Group verbal and visual presentation to define topics covered.  Reviews how body is impacted by stress, anxiety, and depression.  Also discusses healthy ways to reduce stress and to treat/manage anxiety and depression.  Written material given at graduation.   Education: Sleep  Hygiene -Provides group verbal and written instruction about how sleep can affect your health.  Define sleep hygiene, discuss sleep cycles and impact of sleep habits. Review good sleep hygiene tips.    Initial Review & Psychosocial Screening:  Initial Psych Review & Screening - 01/26/20 1411      Initial Review   Current issues with Current Sleep Concerns;Current Stress Concerns    Source of Stress Concerns Occupation    Comments working on his health to return to work.      Family Dynamics   Good Support System? Yes   wife     Barriers   Psychosocial barriers to participate in program There are no identifiable barriers or psychosocial needs.;The patient should benefit  from training in stress management and relaxation.      Screening Interventions   Interventions Encouraged to exercise;To provide support and resources with identified psychosocial needs;Provide feedback about the scores to participant    Expected Outcomes Short Term goal: Utilizing psychosocial counselor, staff and physician to assist with identification of specific Stressors or current issues interfering with healing process. Setting desired goal for each stressor or current issue identified.;Long Term Goal: Stressors or current issues are controlled or eliminated.;Short Term goal: Identification and review with participant of any Quality of Life or Depression concerns found by scoring the questionnaire.;Long Term goal: The participant improves quality of Life and PHQ9 Scores as seen by post scores and/or verbalization of changes           Quality of Life Scores:   Quality of Life - 06/25/20 0939      Quality of Life Scores   Health/Function Pre 28.8 %    Health/Function Post 28 %    Health/Function % Change -2.78 %    Socioeconomic Pre 30 %    Socioeconomic Post 29.06 %    Socioeconomic % Change  -3.13 %    Psych/Spiritual Pre 29.14 %    Psych/Spiritual Post 30 %    Psych/Spiritual % Change 2.95 %    Family  Pre 30 %    Family Post 30 %    Family % Change 0 %    GLOBAL Pre 29.29 %    GLOBAL Post 28.93 %    GLOBAL % Change -1.23 %          Scores of 19 and below usually indicate a poorer quality of life in these areas.  A difference of  2-3 points is a clinically meaningful difference.  A difference of 2-3 points in the total score of the Quality of Life Index has been associated with significant improvement in overall quality of life, self-image, physical symptoms, and general health in studies assessing change in quality of life.  PHQ-9: Recent Review Flowsheet Data    Depression screen [facility] 2/9 06/25/2020 01/30/2020   Decreased Interest 0 0   Down, Depressed, Hopeless 0 0   PHQ - 2 Score 0 0   Altered sleeping 0 0   Tired, decreased energy 0 0   Change in appetite 0 0   Feeling bad or failure about yourself  0 0   Trouble concentrating 0 0   Moving slowly or fidgety/restless 0 0   Suicidal thoughts 0 0   PHQ-9 Score 0 0   Difficult doing work/chores Not difficult at all Not difficult at all     Interpretation of Total Score  Total Score Depression Severity:  1-4 = Minimal depression, 5-9 = Mild depression, 10-14 = Moderate depression, 15-19 = Moderately severe depression, 20-27 = Severe depression   Psychosocial Evaluation and Intervention:  Psychosocial Evaluation - 01/26/20 1418      Psychosocial Evaluation & Interventions   Comments 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 He has a history of renal transplant in 2011 and states all is going well with that. He does have some issues sleeping because some of his medications have to be taken later, but he is getting used to it. He doesn't report any major stressors besides being focused on getting better. He and  his wife are looking forward to the education during the program and  he is ready to boost his stamina and strength in order to get back to work.    Expected Outcomes Short: attend cardiac rehab for education and exercise. Long: develop positive self care habits.           Psychosocial Re-Evaluation:  Psychosocial Re-Evaluation    Row Name 03/07/20 0932 03/26/20 0926 04/16/20 0930 05/16/20 0936 06/25/20 0925     Psychosocial Re-Evaluation   Current issues with Current Stress Concerns -- None Identified Current Sleep Concerns None Identified   Comments Cornelia Copa recently/suddenly lost his mom.  They have been trying to pack things up, she lived in [place] but wanted to be buried in her home town on [place], [place] so he had some scheduling on logistics for that.  His brother who lives near by here was able to help as well. They are headed back up there again next week to finish up some of the packing.  His managing and coping the best he can.  He is sleeping well. Otherwise, he is doing pretty good. Ferdinando states hes sleeping well and has no signs of depression /anxiety,etc. Dontez is able to sleep on both sides now and sleeps well.  He reports no signs of anxiety or depression.  He rides hi smotorcycle to reduce stress. Ripley reports sleeping well, but notices he has a hard time sleeping when he doe not exercise. He rides his motorcycle to reduce stress as well as stretching. He reports having a good support system in his fmaily even reminding him to take his medication. Patient reports exercise and movement during the day has helped his sleep - 1130pm - 630am (he feels well rested). He reports still riding his motorcycle to reduce stress.   Expected Outcomes Short: Finish up packing up mom's things Long: Continue to cope with death positively Short: get back to exercise to help manage stress Long: maintain positive outlook Short: continue to manage stress with exercise and activity Long: maintain good sleep patterns and positive outlook Short: continue to manage stress  with exercise and activity Long: maintain good sleep patterns and positive outlook Short: continue to manage stress with exercise and activity Long: maintain good sleep patterns and positive outlook   Interventions Encouraged to attend Cardiac Rehabilitation for the exercise;Stress management education -- Encouraged to attend Cardiac Rehabilitation for the exercise Encouraged to attend Cardiac Rehabilitation for the exercise Encouraged to attend Cardiac Rehabilitation for the exercise   Continue Psychosocial Services  Follow up required by staff -- -- Follow up required by staff Follow up required by staff     Initial Review   Source of Stress Concerns -- -- -- -- None Identified          Psychosocial Discharge (Final Psychosocial Re-Evaluation):  Psychosocial Re-Evaluation - 06/25/20 3276      Psychosocial Re-Evaluation   Current issues with None Identified    Comments Patient reports exercise and movement during the day has helped his sleep - 1130pm - 630am (he feels well rested). He reports still riding his motorcycle to reduce stress.    Expected Outcomes Short: continue to manage stress with exercise and activity Long: maintain good sleep patterns and positive outlook    Interventions Encouraged to attend Cardiac Rehabilitation for the exercise    Continue Psychosocial Services  Follow up required by staff      Initial Review   Source of Stress Concerns None Identified  Vocational Rehabilitation: Provide vocational rehab assistance to qualifying candidates.   Vocational Rehab Evaluation & Intervention:  Vocational Rehab - 01/26/20 1411      Initial Vocational Rehab Evaluation & Intervention   Assessment shows need for Vocational Rehabilitation No           Education: Education Goals: Education classes will be provided on a variety of topics geared toward better understanding of heart health and risk factor modification. Participant will state  understanding/return demonstration of topics presented as noted by education test scores.  Learning Barriers/Preferences:  Learning Barriers/Preferences - 01/26/20 1410      Learning Barriers/Preferences   Learning Barriers None    Learning Preferences None           General Cardiac Education Topics:  AED/CPR: - Group verbal and written instruction with the use of models to demonstrate the basic use of the AED with the basic ABC's of resuscitation.   Anatomy and Cardiac Procedures: - Group verbal and visual presentation and models provide information about basic cardiac anatomy and function. Reviews the testing methods done to diagnose heart disease and the outcomes of the test results. Describes the treatment choices: Medical Management, Angioplasty, or Coronary Bypass Surgery for treating various heart conditions including Myocardial Infarction, Angina, Valve Disease, and Cardiac Arrhythmias.  Written material given at graduation. Flowsheet Row Cardiac Rehab from 06/27/2020 in [facility] Cardiac and Pulmonary Rehab  Education need identified 01/30/20  Date 02/08/20  Educator SB  Instruction Review Code 1- Verbalizes Understanding      Medication Safety: - Group verbal and visual instruction to review commonly prescribed medications for heart and lung disease. Reviews the medication, class of the drug, and side effects. Includes the steps to properly store meds and maintain the prescription regimen.  Written material given at graduation. Flowsheet Row Cardiac Rehab from 06/27/2020 in [facility] Cardiac and Pulmonary Rehab  Date 05/02/20  Educator [facility]  Instruction Review Code 1- Verbalizes Understanding      Intimacy: - Group verbal instruction through game format to discuss how heart and lung disease can affect sexual intimacy. Written material given at graduation.. Flowsheet Row Cardiac Rehab from 06/27/2020 in [facility] Cardiac and Pulmonary Rehab  Date 06/06/20  Educator [place]  Instruction  Review Code 1- Verbalizes Understanding      Know Your Numbers and Heart Failure: - Group verbal and visual instruction to discuss disease risk factors for cardiac and pulmonary disease and treatment options.  Reviews associated critical values for Overweight/Obesity, Hypertension, Cholesterol, and Diabetes.  Discusses basics of heart failure: signs/symptoms and treatments.  Introduces Heart Failure Zone chart for action plan for heart failure.  Written material given at graduation. Flowsheet Row Cardiac Rehab from 06/27/2020 in [facility] Cardiac and Pulmonary Rehab  Date 05/09/20  Educator SB  Instruction Review Code 1- Verbalizes Understanding      Infection Prevention: - Provides verbal and written material to individual with discussion of infection control including proper hand washing and proper equipment cleaning during exercise session. Flowsheet Row Cardiac Rehab from 06/27/2020 in [facility] Cardiac and Pulmonary Rehab  Date 01/30/20  Educator [facility]  Instruction Review Code 1- Verbalizes Understanding      Falls Prevention: - Provides verbal and written material to individual with discussion of falls prevention and safety. Flowsheet Row Cardiac Rehab from 06/27/2020 in [facility] Cardiac and Pulmonary Rehab  Date 01/30/20  Educator [facility]  Instruction Review Code 1- Verbalizes Understanding      Other: -Provides group and verbal instruction on various topics (see  comments)   Knowledge Questionnaire Score:  Knowledge Questionnaire Score - 06/25/20 0938      Knowledge Questionnaire Score   Pre Score 21/26 Education Focus: MI, angina, nutrtion, exercise    Post Score 21/26: MI, nutrition, exercise           Core Components/Risk Factors/Patient Goals at Admission:  Personal Goals and Risk Factors at Admission - 01/30/20 1532      Core Components/Risk Factors/Patient Goals on Admission    Weight Management Yes;Weight Loss    Intervention Weight Management: Develop a combined nutrition and  exercise program designed to reach desired caloric intake, while maintaining appropriate intake of nutrient and fiber, sodium and fats, and appropriate energy expenditure required for the weight goal.;Weight Management: Provide education and appropriate resources to help participant work on and attain dietary goals.    Admit Weight 195 lb 11.2 oz (88.8 kg)    Goal Weight: Short Term 190 lb (86.2 kg)    Goal Weight: Long Term 190 lb (86.2 kg)    Expected Outcomes Short Term: Continue to assess and modify interventions until short term weight is achieved;Long Term: Adherence to nutrition and physical activity/exercise program aimed toward attainment of established weight goal;Weight Loss: Understanding of general recommendations for a balanced deficit meal plan, which promotes 1-2 lb weight loss per week and includes a negative energy balance of [phone] kcal/d;Understanding recommendations for meals to include 15-35% energy as protein, 25-35% energy from fat, 35-60% energy from carbohydrates, less than 219m of dietary cholesterol, 20-35 gm of total fiber daily;Understanding of distribution of calorie intake throughout the day with the consumption of 4-5 meals/snacks    Diabetes Yes    Intervention Provide education about signs/symptoms and action to take for hypo/hyperglycemia.;Provide education about proper nutrition, including hydration, and aerobic/resistive exercise prescription along with prescribed medications to achieve blood glucose in normal ranges: Fasting glucose 65-99 mg/dL    Expected Outcomes Short Term: Participant verbalizes understanding of the signs/symptoms and immediate care of hyper/hypoglycemia, proper foot care and importance of medication, aerobic/resistive exercise and nutrition plan for blood glucose control.;Long Term: Attainment of HbA1C < 7%.    Heart Failure Yes    Intervention Provide a combined exercise and nutrition program that is supplemented with education, support and  counseling about heart failure. Directed toward relieving symptoms such as shortness of breath, decreased exercise tolerance, and extremity edema.    Expected Outcomes Short term: Attendance in program 2-3 days a week with increased exercise capacity. Reported lower sodium intake. Reported increased fruit and vegetable intake. Reports medication compliance.;Improve functional capacity of life;Short term: Daily weights obtained and reported for increase. Utilizing diuretic protocols set by physician.;Long term: Adoption of self-care skills and reduction of barriers for early signs and symptoms recognition and intervention leading to self-care maintenance.    Hypertension Yes    Intervention Provide education on lifestyle modifcations including regular physical activity/exercise, weight management, moderate sodium restriction and increased consumption of fresh fruit, vegetables, and low fat dairy, alcohol moderation, and smoking cessation.;Monitor prescription use compliance.    Expected Outcomes Short Term: Continued assessment and intervention until BP is < 140/954mHG in hypertensive participants. < 130/8034mG in hypertensive participants with diabetes, heart failure or chronic kidney disease.;Long Term: Maintenance of blood pressure at goal levels.    Lipids Yes    Intervention Provide education and support for participant on nutrition & aerobic/resistive exercise along with prescribed medications to achieve LDL <27m60mDL >40mg73m Expected Outcomes Short Term: Participant states  understanding of desired cholesterol values and is compliant with medications prescribed. Participant is following exercise prescription and nutrition guidelines.;Long Term: Cholesterol controlled with medications as prescribed, with individualized exercise RX and with personalized nutrition plan. Value goals: LDL < 84m, HDL > 40 mg.           Education:Diabetes - Individual verbal and written instruction to review  signs/symptoms of diabetes, desired ranges of glucose level fasting, after meals and with exercise. Acknowledge that pre and post exercise glucose checks will be done for 3 sessions at entry of program. [facility]  Date 01/26/20  Educ[facility] Instruction Review Code 1- Verbalizes Understanding      Core Components/Risk Factors/Patient Goals Review:   Goals and Risk Factor Review    Row Name 03/07/20 [phone]/08/22 0923 04/16/20 0926 05/16/20 0942 06/25/20 0931     Core Components/Risk Factors/Patient Goals Review   Personal Goals Review Weight Management/Obesity;Diabetes;Hypertension;Lipids;Heart Failure -- Weight Management/Obesity;Diabetes;Hypertension;Lipids Weight Management/Obesity;Diabetes;Hypertension;Lipids Weight Management/Obesity;Diabetes;Hypertension;Lipids   Review EEmrickis doing well with rehab.  He is down to 185 lb at home.  He weighs routinely.  His pressures have been good and he checks them at home.  His sugars have been doing well.  His fasting levels have been around 135 in morning.  He is hoping it will contnue to trend down for him.  He is doing well with his medications overall.  He has a cardiology follow up tomorrow and an echo.  He is hoping to get his LifeVest off!! EJuliesis checking BP and BG at home.  He sees EP Dr. next week.  His EF is up to 35-40% from most recent echo.  Fasting BG was 98 yesterday. EDmetriusdid see the EP - they want to do an ICD.  He is not sure if he wants to have the procedure.  He goes back March 30.  He continues to monitor BP and BG at home.  FBG was 125 today.  BP 108/62 at HT.  He had lab work done last week - cholesterol last time was 152.  His weight is staying steady. EMileywent to the doctor - EF 35%, no dizziness or shortness of breath. Blood sugar has been doing well - yesterday his BG was 115 in am - A1C in Feb was 7.2. he continues to make heart healthy changes. BP at  home has been 110-120/58-70. Blood sugar: 140 am  this morning - depends on what he eats the night before. Typically sits from 95-140s in am; if he eats after 8pm it gets higher (piece of fruit). BP: 120/70s at home (2pm), 110/60s at home (am). He still his medication as directed and is doing well. He continues to work on his nutrition and stay active. His weight has been stable.   Expected Outcomes Short: Go to cardiology follow up and continue to work on diabetes management Long; Continue to monitor risk factors. Short: continue to monitor risk factors and see Dr as recommended Long: manage risk factors and maintain at ideal range Short: follow up with Dr about ICD Long: manage risk factors Short: continue with heart healthy changes and checking BG and BP at home Long: manage risk factors Short: continue with heart healthy changes and checking BG and BP at home Long: manage risk factors          Core Components/Risk Factors/Patient Goals at Discharge (Final Review):   Goals and Risk Factor Review - 06/25/20  0931      Core Components/Risk Factors/Patient Goals Review   Personal Goals Review Weight Management/Obesity;Diabetes;Hypertension;Lipids    Review Blood sugar: 140 am  this morning - depends on what he eats the night before. Typically sits from 95-140s in am; if he eats after 8pm it gets higher (piece of fruit). BP: 120/70s at home (2pm), 110/60s at home (am). He still his medication as directed and is doing well. He continues to work on his nutrition and stay active. His weight has been stable.    Expected Outcomes Short: continue with heart healthy changes and checking BG and BP at home Long: manage risk factors           ITP Comments:  ITP Comments    Row Name 01/26/20 1404 01/30/20 1526 02/08/20 0931 02/14/20 0522 02/20/20 0740   ITP Comments Initial telephone orientation completed. Diagnosis can be found in [facility] 10/19. EP orientation scheduled for Tuesday 12/14 at 10am. Completed  6MWT and gym orientation. Initial ITP created and sent for review to Dr. Emily Filbert, Medical Director. First full day of exercise!  Patient was oriented to gym and equipment including functions, settings, policies, and procedures.  Patient's individual exercise prescription and treatment plan were reviewed.  All starting workloads were established based on the results of the 6 minute walk test done at initial orientation visit.  The plan for exercise progression was also introduced and progression will be customized based on patient's performance and goals. 30 Day review completed. Medical Director ITP review done, changes made as directed, and signed approval by Medical Director. Pt's wife called to let us know that his mother had passed on Feb 24, 2020.  She lived in [place] and they will need to go up there to handle affairs.  He will be out at least the next two weeks.   [place] Name 02/29/20 1130 03/13/20 0846 03/26/20 5009 04/10/20 0608 04/10/20 1521   ITP Comments Savvas has not attended sicne last review due to a death in his family. 30 Day review completed. Medical Director ITP review done, changes made as directed, and signed approval by Medical Director. Returns today after a death in his family 33 Day review completed. Medical Director ITP review done, changes made as directed, and signed approval by Medical Director. Completed initial RD evaluation   Row Name 06/05/20 0902 07/02/20 0836         ITP Comments 30 Day review completed. Medical Director ITP review done, changes made as directed, and signed approval by Medical Director. Discharged  only one visit  left and he is home with family member positive for Covid 19             Comments: Discharged

## 2020-07-02 NOTE — Progress Notes (Signed)
Discharge Progress Report  Patient Details  Name: Casey [name]. MRN: [id number] Date of Birth: 19-Jul-1960 Referring Provider:   Flowsheet Row Cardiac Rehab from 01/30/2020 in [facility] Card[facility]  Referring Provider Loralie Champagne MD       Number of Visits: 35  Reason for Discharge:  Patient reached a stable level of exercise. Patient independent in their exercise.  Smoking History:  Social History   Tobacco Use  Smoking Status Never Smoker  Smokeless Tobacco Never Used    Diagnosis:  Status post coronary artery stent placement  ST elevation myocardial infarction (STEMI), unspecified artery (HCC)   Initial Exercise Prescription:  Initial Exercise Prescription - 01/30/20 1500      Date of Initial Exercise RX and Referring Provider   Date 01/30/20    Referring Provider Loralie Champagne MD      Treadmill   MPH 2.3    Grade 1    Minutes 15    METs 3.08      NuStep   Level 4    SPM 80    Minutes 15    METs 3      Elliptical   Level 1    Speed 3.5    Minutes 15    METs 3      Biostep-RELP   Level 4    SPM 50    Minutes 15    METs 3      Prescription Details   Frequency (times per week) 2    Duration Progress to 30 minutes of continuous aerobic without signs/symptoms of physical distress      Intensity   THRR 40-80% of Max Heartrate 109-144    Ratings of Perceived Exertion 11-13    Perceived Dyspnea 0-4      Progression   Progression Continue to progress workloads to maintain intensity without signs/symptoms of physical distress.      Resistance Training   Training Prescription Yes    Weight 4 lb    Reps 10-15           Discharge Exercise Prescription (Final Exercise Prescription Changes):  Exercise Prescription Changes - 06/19/20 1000      Response to Exercise   Blood Pressure (Admit) 120/64    Blood Pressure (Exercise) 120/64    Blood Pressure (Exit) 104/62    Heart Rate (Admit) 65 bpm    Heart Rate (Exercise) 72 bpm     Heart Rate (Exit) 62 bpm    Rating of Perceived Exertion (Exercise) 12    Symptoms none    Duration Continue with 30 min of aerobic exercise without signs/symptoms of physical distress.    Intensity THRR unchanged      Progression   Progression Continue to progress workloads to maintain intensity without signs/symptoms of physical distress.    Average METs 3      Resistance Training   Training Prescription Yes    Weight 5 lb    Reps 10-15      Treadmill   MPH 3    Grade 1    Minutes 15    METs 3.71      Elliptical   Level 1    Minutes 15    METs 2.2           Functional Capacity:  6 Minute Walk    Row Name 01/30/20 1526 06/18/20 0930       6 Minute Walk   Phase Initial Discharge    Distance 1230 feet 1720  feet    Distance % Change -- 39.8 %    Distance Feet Change -- 490 ft    Walk Time 6 minutes 6 minutes    # of Rest Breaks 0 0    MPH 2.33 3.25    METS 3.71 4.34    RPE 9 12    Perceived Dyspnea  -- 0    VO2 Peak 12.97 15.2    Symptoms No No    Resting HR 75 bpm 66 bpm    Resting BP 128/70 120/66    Resting Oxygen Saturation  97 % 96 %    Exercise Oxygen Saturation  during 6 min walk 96 % 96 %    Max Ex. HR 101 bpm 93 bpm    Max Ex. BP 146/64 120/64    2 Minute Post BP 126/64 --           Nutrition & Weight - Outcomes:  Pre Biometrics - 01/30/20 1530      Pre Biometrics   Height '6\' 1"'$  (1.854 m)    Weight 195 lb 11.2 oz (88.8 kg)    BMI (Calculated) 25.83    Single Leg Stand 30 seconds            Nutrition:  Nutrition Therapy & Goals - 04/10/20 1502      Nutrition Therapy   Diet Heart healthy, low Na, diabetes friendly    Drug/Food Interactions Statins/Certain Fruits;Coumadin/Vit K    Protein (specify units) 70g    Fiber 30 grams    Whole Grain Foods 3 servings    Saturated Fats 12 max. grams    Fruits and Vegetables 8 servings/day    Sodium 1.5 grams      Personal Nutrition Goals   Nutrition Goal ST: add 1 serving of complex  carbohydrates to meals, he would like to include sweet potatoes LT: include all macronutrients in meals    Comments green beans, boiled chicken, salmon, baked pork chop, spinach, broccoli, boiled eggs, salads, berries, oranges, vegetables. BG in am 120-125, A1C 7.2. He has limited carbohydrates, recommended including some complex carbohydrates at least 1 exchange per meal. Pt is open to making changes. He reports speaking to a dietitian in the last two months and feeling like he has no questions at this time. Reviewed heart healthy and diabetes friendly eating.      Intervention Plan   Intervention Prescribe, educate and counsel regarding individualized specific dietary modifications aiming towards targeted core components such as weight, hypertension, lipid management, diabetes, heart failure and other comorbidities.;Nutrition handout(s) given to patient.    Expected Outcomes Short Term Goal: Understand basic principles of dietary content, such as calories, fat, sodium, cholesterol and nutrients.;Short Term Goal: A plan has been developed with personal nutrition goals set during dietitian appointment.;Long Term Goal: Adherence to prescribed nutrition plan.

## 2020-07-05 ENCOUNTER — Other Ambulatory Visit (HOSPITAL_COMMUNITY): Payer: Self-pay | Admitting: Adult Health

## 2020-07-08 ENCOUNTER — Other Ambulatory Visit (HOSPITAL_COMMUNITY): Payer: Self-pay | Admitting: Adult Health

## 2020-07-09 ENCOUNTER — Ambulatory Visit: Payer: BC Managed Care – PPO

## 2020-07-11 ENCOUNTER — Ambulatory Visit: Payer: BC Managed Care – PPO

## 2020-07-16 ENCOUNTER — Ambulatory Visit: Payer: BC Managed Care – PPO

## 2020-07-17 ENCOUNTER — Encounter: Payer: Self-pay | Admitting: Emergency Medicine

## 2020-07-17 ENCOUNTER — Emergency Department: Payer: BC Managed Care – PPO

## 2020-07-17 ENCOUNTER — Emergency Department
Admission: EM | Admit: 2020-07-17 | Discharge: 2020-07-17 | Disposition: A | Discharge status: Home / Self Care | Payer: BC Managed Care – PPO | Attending: Emergency Medicine | Admitting: Emergency Medicine

## 2020-07-17 ENCOUNTER — Ambulatory Visit: Payer: BC Managed Care – PPO

## 2020-07-17 ENCOUNTER — Other Ambulatory Visit (HOSPITAL_COMMUNITY): Payer: BC Managed Care – PPO

## 2020-07-17 ENCOUNTER — Other Ambulatory Visit: Payer: Self-pay

## 2020-07-17 DIAGNOSIS — Z7901 Long term (current) use of anticoagulants: Secondary | ICD-10-CM | POA: Diagnosis not present

## 2020-07-17 DIAGNOSIS — I4891 Unspecified atrial fibrillation: Secondary | ICD-10-CM | POA: Insufficient documentation

## 2020-07-17 DIAGNOSIS — Z940 Kidney transplant status: Secondary | ICD-10-CM | POA: Diagnosis not present

## 2020-07-17 DIAGNOSIS — I236 Thrombosis of atrium, auricular appendage, and ventricle as current complications following acute myocardial infarction: Secondary | ICD-10-CM | POA: Diagnosis not present

## 2020-07-17 DIAGNOSIS — S4992XA Unspecified injury of left shoulder and upper arm, initial encounter: Secondary | ICD-10-CM

## 2020-07-17 DIAGNOSIS — Y92410 Unspecified street and highway as the place of occurrence of the external cause: Secondary | ICD-10-CM | POA: Diagnosis not present

## 2020-07-17 DIAGNOSIS — Z79899 Other long term (current) drug therapy: Secondary | ICD-10-CM | POA: Diagnosis not present

## 2020-07-17 DIAGNOSIS — Z7984 Long term (current) use of oral hypoglycemic drugs: Secondary | ICD-10-CM | POA: Insufficient documentation

## 2020-07-17 DIAGNOSIS — N186 End stage renal disease: Secondary | ICD-10-CM | POA: Diagnosis not present

## 2020-07-17 DIAGNOSIS — I132 Hypertensive heart and chronic kidney disease with heart failure and with stage 5 chronic kidney disease, or end stage renal disease: Secondary | ICD-10-CM | POA: Diagnosis not present

## 2020-07-17 DIAGNOSIS — S60812A Abrasion of left wrist, initial encounter: Secondary | ICD-10-CM | POA: Insufficient documentation

## 2020-07-17 DIAGNOSIS — I2102 ST elevation (STEMI) myocardial infarction involving left anterior descending coronary artery: Secondary | ICD-10-CM

## 2020-07-17 DIAGNOSIS — M7918 Myalgia, other site: Secondary | ICD-10-CM

## 2020-07-17 DIAGNOSIS — S6992XA Unspecified injury of left wrist, hand and finger(s), initial encounter: Secondary | ICD-10-CM | POA: Diagnosis present

## 2020-07-17 DIAGNOSIS — I5021 Acute systolic (congestive) heart failure: Secondary | ICD-10-CM | POA: Insufficient documentation

## 2020-07-17 DIAGNOSIS — E1122 Type 2 diabetes mellitus with diabetic chronic kidney disease: Secondary | ICD-10-CM | POA: Diagnosis not present

## 2020-07-17 DIAGNOSIS — Z7902 Long term (current) use of antithrombotics/antiplatelets: Secondary | ICD-10-CM | POA: Diagnosis not present

## 2020-07-17 LAB — POCT INR: INR: 2.7 (ref 2.0–3.0)

## 2020-07-17 MED ORDER — TIZANIDINE HCL 4 MG PO TABS: 4.0000 mg | ORAL_TABLET | Freq: Three times a day (TID) | ORAL | 0 refills | Status: DC

## 2020-07-17 MED ORDER — BACITRACIN-NEOMYCIN-POLYMYXIN 400-5-5000 EX OINT
TOPICAL_OINTMENT | Freq: Once | CUTANEOUS | Status: AC
  Administered 2020-07-17: 1 via TOPICAL
  Filled 2020-07-17: qty 1
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
## 2020-07-17 NOTE — Patient Instructions (Signed)
-   continue taking warfarin 3 tablets every day EXCEPT 2 TABLETS ON MONDAYS & FRIDAYS. Add another leafy veggie to your diet and remain consistent.  - Recheck INR in 4 weeks.  Coumadin Clinic [facility] [phone].

## 2020-07-17 NOTE — Discharge Instructions (Addendum)
Please follow up with your primary care provider for symptoms that are not improving over the next week or so.  Return to the ER for symptoms that change or worsen if unable to schedule an appointment.
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
## 2020-07-18 ENCOUNTER — Ambulatory Visit: Payer: BC Managed Care – PPO

## 2020-08-06 ENCOUNTER — Other Ambulatory Visit: Payer: Self-pay | Admitting: Cardiology

## 2020-08-14 ENCOUNTER — Ambulatory Visit (INDEPENDENT_AMBULATORY_CARE_PROVIDER_SITE_OTHER): Payer: BC Managed Care – PPO | Admitting: Pharmacist

## 2020-08-14 ENCOUNTER — Other Ambulatory Visit: Payer: Self-pay

## 2020-08-14 ENCOUNTER — Telehealth (HOSPITAL_COMMUNITY): Payer: Self-pay | Admitting: *Deleted

## 2020-08-14 DIAGNOSIS — Z7901 Long term (current) use of anticoagulants: Secondary | ICD-10-CM

## 2020-08-14 DIAGNOSIS — I236 Thrombosis of atrium, auricular appendage, and ventricle as current complications following acute myocardial infarction: Secondary | ICD-10-CM | POA: Diagnosis not present

## 2020-08-14 DIAGNOSIS — I2102 ST elevation (STEMI) myocardial infarction involving left anterior descending coronary artery: Secondary | ICD-10-CM

## 2020-08-14 DIAGNOSIS — I4891 Unspecified atrial fibrillation: Secondary | ICD-10-CM | POA: Diagnosis not present

## 2020-08-14 LAB — POCT INR: INR: 5.5 — AB (ref 2.0–3.0)

## 2020-08-14 NOTE — Patient Instructions (Signed)
Description   -Hold warfarin today and tomorrow. - continue taking warfarin 3 tablets every day EXCEPT 2 TABLETS ON MONDAYS & FRIDAYS. Add another leafy veggie to your diet and remain consistent.  - Recheck INR in 1 week.  Coumadin Clinic [facility] [phone].

## 2020-08-14 NOTE — Telephone Encounter (Signed)
Received vm that pt needed a call back concerning a prescription. I called pt back no answer/left vm for return call.

## 2020-08-21 ENCOUNTER — Ambulatory Visit (HOSPITAL_COMMUNITY)
Admission: RE | Admit: 2020-08-21 | Discharge: 2020-08-21 | Disposition: A | Discharge status: Home / Self Care | Payer: BC Managed Care – PPO | Source: Ambulatory Visit | Attending: Cardiology | Admitting: Cardiology

## 2020-08-21 ENCOUNTER — Other Ambulatory Visit: Payer: Self-pay

## 2020-08-21 ENCOUNTER — Ambulatory Visit (INDEPENDENT_AMBULATORY_CARE_PROVIDER_SITE_OTHER): Payer: BC Managed Care – PPO | Admitting: Pharmacist

## 2020-08-21 ENCOUNTER — Other Ambulatory Visit (HOSPITAL_COMMUNITY): Payer: Self-pay | Admitting: Cardiology

## 2020-08-21 DIAGNOSIS — I236 Thrombosis of atrium, auricular appendage, and ventricle as current complications following acute myocardial infarction: Secondary | ICD-10-CM | POA: Diagnosis not present

## 2020-08-21 DIAGNOSIS — Z7901 Long term (current) use of anticoagulants: Secondary | ICD-10-CM | POA: Diagnosis not present

## 2020-08-21 DIAGNOSIS — I4891 Unspecified atrial fibrillation: Secondary | ICD-10-CM

## 2020-08-21 DIAGNOSIS — I2102 ST elevation (STEMI) myocardial infarction involving left anterior descending coronary artery: Secondary | ICD-10-CM | POA: Diagnosis not present

## 2020-08-21 DIAGNOSIS — E785 Hyperlipidemia, unspecified: Secondary | ICD-10-CM

## 2020-08-21 LAB — HEPATIC FUNCTION PANEL
ALT: 102 U/L — ABNORMAL HIGH (ref 0–44)
AST: 109 U/L — ABNORMAL HIGH (ref 15–41)
Albumin: 3.6 g/dL (ref 3.5–5.0)
Alkaline Phosphatase: 68 U/L (ref 38–126)
Bilirubin, Direct: <0.1 mg/dL (ref 0.0–0.2)
Total Bilirubin: 0.8 mg/dL (ref 0.3–1.2)
Total Protein: 6.1 g/dL — ABNORMAL LOW (ref 6.5–8.1)

## 2020-08-21 LAB — LIPID PANEL
Cholesterol: 120 mg/dL (ref 0–200)
HDL: 55 mg/dL (ref >40)
LDL Cholesterol: 40 mg/dL (ref 0–99)
Total CHOL/HDL Ratio: 2.2 RATIO
Triglycerides: 127 mg/dL (ref <150)
VLDL: 25 mg/dL (ref 0–40)

## 2020-08-21 LAB — POCT INR: INR: 2.6 (ref 2.0–3.0)

## 2020-08-21 NOTE — Patient Instructions (Signed)
Description    - continue taking warfarin 3 tablets every day EXCEPT 2 TABLETS ON MONDAYS & FRIDAYS.  - Keep consistent on leafy greens - Recheck INR in 3 weeks.  Coumadin Clinic [facility] [phone].

## 2020-08-26 ENCOUNTER — Telehealth (HOSPITAL_COMMUNITY): Payer: Self-pay

## 2020-08-26 DIAGNOSIS — E785 Hyperlipidemia, unspecified: Secondary | ICD-10-CM

## 2020-08-26 MED ORDER — AMIODARONE HCL 200 MG PO TABS: 100.0000 mg | ORAL_TABLET | Freq: Every day | ORAL | 2 refills | Status: DC

## 2020-08-26 NOTE — Telephone Encounter (Signed)
-----   Message from Larey Dresser, MD sent at 08/21/2020  7:14 PM EDT ----- Increased LFTs.  Does he have any abdominal pain? Decrease amiodarone to 100 mg daily and repeat LFTs in 1 week.

## 2020-08-26 NOTE — Addendum Note (Signed)
Addended by: Stanford Scotland on: 08/26/2020 12:55 PM   Modules accepted: Orders

## 2020-08-26 NOTE — Telephone Encounter (Signed)
Patient aware.Denies abdominal pain.Will decrease Amiodarone to 100 mgdaily.Repeat labs to be done 7/19.

## 2020-09-03 ENCOUNTER — Other Ambulatory Visit: Payer: Self-pay

## 2020-09-03 ENCOUNTER — Ambulatory Visit (HOSPITAL_COMMUNITY)
Admission: RE | Admit: 2020-09-03 | Discharge: 2020-09-03 | Disposition: A | Discharge status: Home / Self Care | Payer: BC Managed Care – PPO | Source: Ambulatory Visit | Attending: Cardiology | Admitting: Cardiology

## 2020-09-03 DIAGNOSIS — E785 Hyperlipidemia, unspecified: Secondary | ICD-10-CM

## 2020-09-03 DIAGNOSIS — I2102 ST elevation (STEMI) myocardial infarction involving left anterior descending coronary artery: Secondary | ICD-10-CM | POA: Diagnosis not present

## 2020-09-03 DIAGNOSIS — I236 Thrombosis of atrium, auricular appendage, and ventricle as current complications following acute myocardial infarction: Secondary | ICD-10-CM | POA: Insufficient documentation

## 2020-09-03 DIAGNOSIS — Z7901 Long term (current) use of anticoagulants: Secondary | ICD-10-CM

## 2020-09-03 DIAGNOSIS — I4891 Unspecified atrial fibrillation: Secondary | ICD-10-CM | POA: Insufficient documentation

## 2020-09-03 LAB — HEPATIC FUNCTION PANEL
ALT: 88 U/L — ABNORMAL HIGH (ref 0–44)
AST: 39 U/L (ref 15–41)
Albumin: 3.5 g/dL (ref 3.5–5.0)
Alkaline Phosphatase: 54 U/L (ref 38–126)
Bilirubin, Direct: <0.1 mg/dL (ref 0.0–0.2)
Total Bilirubin: 0.6 mg/dL (ref 0.3–1.2)
Total Protein: 5.6 g/dL — ABNORMAL LOW (ref 6.5–8.1)

## 2020-09-10 ENCOUNTER — Other Ambulatory Visit (HOSPITAL_COMMUNITY): Payer: Self-pay | Admitting: *Deleted

## 2020-09-10 DIAGNOSIS — I5022 Chronic systolic (congestive) heart failure: Secondary | ICD-10-CM

## 2020-09-11 ENCOUNTER — Ambulatory Visit (INDEPENDENT_AMBULATORY_CARE_PROVIDER_SITE_OTHER): Payer: BC Managed Care – PPO

## 2020-09-11 ENCOUNTER — Other Ambulatory Visit: Payer: Self-pay

## 2020-09-11 DIAGNOSIS — I4891 Unspecified atrial fibrillation: Secondary | ICD-10-CM | POA: Diagnosis not present

## 2020-09-11 DIAGNOSIS — Z7901 Long term (current) use of anticoagulants: Secondary | ICD-10-CM | POA: Diagnosis not present

## 2020-09-11 DIAGNOSIS — I2102 ST elevation (STEMI) myocardial infarction involving left anterior descending coronary artery: Secondary | ICD-10-CM | POA: Diagnosis not present

## 2020-09-11 DIAGNOSIS — I236 Thrombosis of atrium, auricular appendage, and ventricle as current complications following acute myocardial infarction: Secondary | ICD-10-CM | POA: Diagnosis not present

## 2020-09-11 LAB — POCT INR: INR: 4 — AB (ref 2.0–3.0)

## 2020-09-11 NOTE — Patient Instructions (Signed)
-   SKIP WARFARIN TONIGHT, take 2 mg TOMORROW, then, - START NEW DOSAGE warfarin 3 tablets every day EXCEPT 2 TABLETS ON MONDAYS, [place].  - Keep consistent on leafy greens - Recheck INR in 3 weeks. Coumadin Clinic [facility] [phone].

## 2020-09-19 ENCOUNTER — Other Ambulatory Visit: Payer: Self-pay

## 2020-09-19 ENCOUNTER — Ambulatory Visit (HOSPITAL_COMMUNITY)
Admission: RE | Admit: 2020-09-19 | Discharge: 2020-09-19 | Disposition: A | Discharge status: Home / Self Care | Payer: BC Managed Care – PPO | Source: Ambulatory Visit | Attending: Cardiology | Admitting: Cardiology

## 2020-09-19 DIAGNOSIS — I5022 Chronic systolic (congestive) heart failure: Secondary | ICD-10-CM | POA: Insufficient documentation

## 2020-09-19 LAB — HEPATIC FUNCTION PANEL
ALT: 45 U/L — ABNORMAL HIGH (ref 0–44)
AST: 25 U/L (ref 15–41)
Albumin: 3.4 g/dL — ABNORMAL LOW (ref 3.5–5.0)
Alkaline Phosphatase: 43 U/L (ref 38–126)
Bilirubin, Direct: <0.1 mg/dL (ref 0.0–0.2)
Total Bilirubin: 0.6 mg/dL (ref 0.3–1.2)
Total Protein: 5.5 g/dL — ABNORMAL LOW (ref 6.5–8.1)

## 2020-09-24 ENCOUNTER — Encounter (HOSPITAL_COMMUNITY): Payer: BC Managed Care – PPO

## 2020-09-30 NOTE — Progress Notes (Signed)
Advanced Heart Failure Clinic Progress Note   PCP: Dion Body, MD HF Cardiology: Dr. Aundra Dubin  60 y.o. with history of renal transplant in 2011, CAD s/p anterior MI, and ischemic cardiomyopathy presents for followup of CHF and CAD.  Patient had his renal transplant at [facility], and has been followed by [facility] as well as [facility] since that time.  He had been doing well until 10/21.  In early 10/21, he developed chest pain.  He did not go immediately to the ER, he presented when the pain had continued for > 1 day.  He was found to have acute anterior MI with late presentation. He went for cath, LAD was occluded and there was severe diffuse disease in the RCA as well as severe disease in the LCx system.  He had DES to the LAD.  He subsequently developed cardiogenic shock as well as suspected septic shock, possible from gut source.  Abdominal imaging showed profound ileus.  He was taken to the OR for Impella 5.5 placement, but this was deferred due to the finding of LV thrombus on intra-op TEE.  He also developed atrial fibrillation with RVR, controlled by amiodarone and eventually converted back to NSR.  He was maintained on milrinone 0.25 with gradual improvement.  However, he developed AKI, likely due to contrast as well as cardiorenal. Creatinine went up to 5.  Creatinine gradually improved down to 3.38 at discharge, and we were able to discontinue milrinone.  Ileus also gradually resolved.  Echo in the hospital showed EF 20-25%. He initially wore a Lifevest but has now sent it back.   Echo in 1/22 showed EF 30-35%, periapical akinesis, no LV thrombus, normal RV.  He saw EP to discuss ICD and atrial fibrillation ablation, but he was not ready for this yet.   Had clinic f/u w/ Dr. Aundra Dubin 2/22 and symptomatically was doing quite well. Was enrolled in cardiac rehab and able to run for a short distance on his treadmill w/o significant exertional dyspnea.  Denied chest pain,  orthopnea/PND and palpitations.  Given lack of CP and SCr persistently > 2, it was decided not to persure staged PCI of LCx. Continued medical therapy of CAD elected. GDMT further titrated.    He had echo on 06/19/20, and LVEF has improved to 40-45%. RV ok. Had f/u w/ Dr. Curt Bears to discuss afib ablation and ultimately decided to continue on amio and hold off on ablation.   Today he returns for HF follow up. Overall feeling fine. No significant dyspnea with activity. Working full time in [place]. Denies CP, dizziness, edema, or PND/Orthopnea. Appetite ok. No fever or chills. Weight at home 188 pounds. Takes 1/2 torsemide when he feels he needs it, has not used any recently.   ECG (personally reviewed): SR lateral TW abnormality, 62 bpm  Labs (10/21): K 4.3, creatinine 3.38 Labs (11/21): K 4.7, creatinine 2.24, LDL 68, HDL 43, hgb 11.9, LFTs normal Labs (12/21): K 3.9, creatinine 2.43 Labs (2/22): LDL 81, LFTs normal, K 3.7, creatinine 2.49 Labs (3/22): SCr 3.34, K 4.0, TSH normal  Labs (4/22): LDL 36, LFTs normal  Labs (6/22): K 3.6, creatinine 3.2   PMH: 1. Type 2 diabetes 2. Atrial fibrillation: Paroxysmal, on amiodarone.  3. Renal transplant 2011 [facility].  4. GERD 5. 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 CAD: Late presentation anterior MI in 10/21.  Cath with occluded LAD, long up to 99% mid-distal RCA stenosis, 95% mid-distal LCx, 80% OM3.  He had DES to LAD.   8. Chronic systolic CHF: Ischemic cardiomyopathy. Cardiogenic shock in 10/21 with MI.  - Echo (10/21): EF 20-25%, heavy smoke/early thrombus LV apex, RV normal.  - RHC (10/21, milrinone 0.25): mean RA 4, PA 22/4, mean PCWP 6, CI 2.06 Fick, CI 2.06 thermo.  - Echo (1/22): EF 30-35%, periapical akinesis, no LV thrombus, normal RV.  9. LV thrombus  SH: Married, nonsmoker, no ETOH.  Lives in [place]. 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   Family History  Problem Relation Age of Onset   Diabetes Mother    Hyperlipidemia Mother     Cancer Father    ROS: all systems reviewed and negative except as per HPI.   Current Outpatient Medications  Medication Sig Dispense Refill   amiodarone (PACERONE) 200 MG tablet Take 0.5 tablets (100 mg total) by mouth daily. 90 tablet 2   carvedilol (COREG) 25 MG tablet Take 1 tablet (25 mg total) by mouth 2 (two) times daily. 60 tablet 11   clopidogrel (PLAVIX) 75 MG tablet TAKE 1 TABLET BY MOUTH EVERY DAY 90 tablet 3   ezetimibe (ZETIA) 10 MG tablet Take 1 tablet (10 mg total) by mouth daily. 30 tablet 11   glipiZIDE (GLUCOTROL) 10 MG tablet Take 10 mg by mouth 2 (two) times daily before a meal.     isosorbide-hydrALAZINE (BIDIL) 20-37.5 MG tablet Take 2 tablets by mouth 3 (three) times daily. 180 tablet 6   loratadine (CLARITIN) 10 MG tablet Take 10 mg by mouth daily as needed for allergies.     nitroGLYCERIN (NITROSTAT) 0.4 MG SL tablet Place 1 tablet (0.4 mg total) under the tongue every 5 (five) minutes as needed for chest pain. 100 tablet 3   pantoprazole (PROTONIX) 40 MG tablet TAKE 1 TABLET BY MOUTH EVERY DAY 90 tablet 1   predniSONE (DELTASONE) 5 MG tablet Take 1 tablet (5 mg total) by mouth daily with breakfast.     rosuvastatin (CRESTOR) 40 MG tablet TAKE 1 TABLET BY MOUTH EVERY DAY 90 tablet 1   simethicone (MYLICON) 0000000 MG chewable tablet Chew 125 mg by mouth every 6 (six) hours as needed for flatulence.     tacrolimus (PROGRAF) 1 MG capsule Take 4 mg by mouth 2 (two) times daily.     tiZANidine (ZANAFLEX) 4 MG tablet Take 1 tablet (4 mg total) by mouth 3 (three) times daily. 30 tablet 0   warfarin (COUMADIN) 1 MG tablet TAKE 2 TO 3 TABLETS BY MOUTH AS DIRECTED BY ANTICOAGULATION CLINIC. 30 DAY SUPPLY PER MD 270 tablet 3   No current facility-administered medications for this encounter.   Wt Readings from Last 3 Encounters:  10/01/20 91.4 kg  07/17/20 86.2 kg  06/24/20 88.2 kg   BP 128/80   Pulse 63   Wt 91.4 kg   SpO2 97%   BMI 26.57 kg/m   PHYSICAL EXAM: ReDs:  48% General:  NAD. No resp difficulty HEENT: Normal Neck: Supple. JVP 8-9. Carotids 2+ bilat; no bruits. No lymphadenopathy or thryomegaly appreciated. Cor: PMI nondisplaced. Regular rate & rhythm. No rubs, gallops or murmurs. Lungs: Clear Abdomen: Soft, nontender, nondistended. No hepatosplenomegaly. No bruits or masses. Good bowel sounds. Extremities: No cyanosis, clubbing, rash, edema Neuro: Alert & oriented x 3, cranial nerves grossly intact. Moves all 4 extremities w/o difficulty. Affect pleasant.  Assessment/Plan: 1. CAD: S/p late presentation anterior MI in 10/21 with DES to LAD.  He has residual severe disease in the RCA which is not  revascularizable.   He has 95% mid-distal LCx stenosis and 80% OM3 stenosis that could potentially be intervened upon.  Stable w/o anginal symptomatology.  - Given CKD w/ SCr >2.0 and lack of CP, will continue medical therapy for residual disease.  - Continue Plavix for up to 1 year post-PCI (~10/22).  He is now off ASA as he is also on warfarin.  Can stop Plavix at 6 months if he develops issues with bleeding.  - Continue statin, good lipids in 7/22. LDL 40 mg/dL. LFTs WNL  2. Atrial fibrillation: Paroxysmal.  He is in NSR today on EKG. Denies breakthrough symptoms w/ amio   - Continue amiodarone 100 mg daily.  Dose reduced due to elevated LFTs 7/22. Have now returned to normal. Will need regular eye exam.  - Continue warfarin.  INR followed by Coumadin clinic  - Followed by Dr. Curt Bears and patient decided to hold off on ablation and continue on amiodarone. 3. Chronic systolic CHF: Ischemic cardiomyopathy.  Echo in 10/21 with EF 20-25%.  Echo in 1/22 showed EF 30-35% with regional wall motion abnormalities.  Echo 06/19/20 LVEF improved to 40-45%. RV ok. NYHA I-II today, however volume is up on exam & ReDs clip 48%. - Start torsemide 20 mg daily x 3 days then PRN. BMET & BNP today, repeat BMET in 10 days. - Continue Bidil 2 tabs tid.   - Continue Coreg 25 mg  bid.  - SGLT2i stopped recently by Nephrology. - No ARB/ARNi, spiro or dig w/ CKD.  - EF now out of range for ICD  4. LV thrombus: No thrombus on 1/22 echo.   - Now on warfarin. INRs followed by coumadin clinic  5. 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  - BMET today. 6. Type 2 diabetes: Now off SGLT2i. Managed with glipizide.  Follow up in 3-4 weeks with APP to reassess volume, and then in 3 months with Dr. Aundra Dubin.   [place], [place] 10/01/2020

## 2020-10-01 ENCOUNTER — Ambulatory Visit (HOSPITAL_COMMUNITY)
Admission: RE | Admit: 2020-10-01 | Discharge: 2020-10-01 | Disposition: A | Discharge status: Home / Self Care | Payer: BC Managed Care – PPO | Source: Ambulatory Visit | Attending: Cardiology | Admitting: Cardiology

## 2020-10-01 ENCOUNTER — Encounter (HOSPITAL_COMMUNITY): Payer: Self-pay

## 2020-10-01 ENCOUNTER — Other Ambulatory Visit: Payer: Self-pay

## 2020-10-01 VITALS — BP 128/80 | HR 63 | Wt 201.4 lb

## 2020-10-01 DIAGNOSIS — I5022 Chronic systolic (congestive) heart failure: Secondary | ICD-10-CM | POA: Diagnosis not present

## 2020-10-01 DIAGNOSIS — Z7902 Long term (current) use of antithrombotics/antiplatelets: Secondary | ICD-10-CM | POA: Insufficient documentation

## 2020-10-01 DIAGNOSIS — I2510 Atherosclerotic heart disease of native coronary artery without angina pectoris: Secondary | ICD-10-CM

## 2020-10-01 DIAGNOSIS — R7989 Other specified abnormal findings of blood chemistry: Secondary | ICD-10-CM | POA: Diagnosis not present

## 2020-10-01 DIAGNOSIS — Z7901 Long term (current) use of anticoagulants: Secondary | ICD-10-CM | POA: Diagnosis not present

## 2020-10-01 DIAGNOSIS — N186 End stage renal disease: Secondary | ICD-10-CM | POA: Diagnosis not present

## 2020-10-01 DIAGNOSIS — Z833 Family history of diabetes mellitus: Secondary | ICD-10-CM | POA: Diagnosis not present

## 2020-10-01 DIAGNOSIS — I252 Old myocardial infarction: Secondary | ICD-10-CM | POA: Insufficient documentation

## 2020-10-01 DIAGNOSIS — I255 Ischemic cardiomyopathy: Secondary | ICD-10-CM | POA: Insufficient documentation

## 2020-10-01 DIAGNOSIS — N183 Chronic kidney disease, stage 3 unspecified: Secondary | ICD-10-CM | POA: Diagnosis not present

## 2020-10-01 DIAGNOSIS — I480 Paroxysmal atrial fibrillation: Secondary | ICD-10-CM | POA: Insufficient documentation

## 2020-10-01 DIAGNOSIS — Z79899 Other long term (current) drug therapy: Secondary | ICD-10-CM | POA: Diagnosis not present

## 2020-10-01 DIAGNOSIS — I130 Hypertensive heart and chronic kidney disease with heart failure and stage 1 through stage 4 chronic kidney disease, or unspecified chronic kidney disease: Secondary | ICD-10-CM | POA: Insufficient documentation

## 2020-10-01 DIAGNOSIS — Z955 Presence of coronary angioplasty implant and graft: Secondary | ICD-10-CM | POA: Insufficient documentation

## 2020-10-01 DIAGNOSIS — Z940 Kidney transplant status: Secondary | ICD-10-CM | POA: Insufficient documentation

## 2020-10-01 DIAGNOSIS — Z7984 Long term (current) use of oral hypoglycemic drugs: Secondary | ICD-10-CM | POA: Insufficient documentation

## 2020-10-01 DIAGNOSIS — E1122 Type 2 diabetes mellitus with diabetic chronic kidney disease: Secondary | ICD-10-CM

## 2020-10-01 DIAGNOSIS — I4891 Unspecified atrial fibrillation: Secondary | ICD-10-CM

## 2020-10-01 DIAGNOSIS — I236 Thrombosis of atrium, auricular appendage, and ventricle as current complications following acute myocardial infarction: Secondary | ICD-10-CM

## 2020-10-01 LAB — BASIC METABOLIC PANEL
Anion gap: 8 (ref 5–15)
BUN: 42 mg/dL — ABNORMAL HIGH (ref 6–20)
CO2: 23 mmol/L (ref 22–32)
Calcium: 9.7 mg/dL (ref 8.9–10.3)
Chloride: 111 mmol/L (ref 98–111)
Creatinine, Ser: 3.31 mg/dL — ABNORMAL HIGH (ref 0.61–1.24)
GFR, Estimated: 20 mL/min — ABNORMAL LOW (ref >60)
Glucose, Bld: 195 mg/dL — ABNORMAL HIGH (ref 70–99)
Potassium: 4.1 mmol/L (ref 3.5–5.1)
Sodium: 142 mmol/L (ref 135–145)

## 2020-10-01 LAB — BRAIN NATRIURETIC PEPTIDE: B Natriuretic Peptide: 607.2 pg/mL — ABNORMAL HIGH (ref 0.0–100.0)

## 2020-10-01 MED ORDER — TORSEMIDE 20 MG PO TABS: 20.0000 mg | ORAL_TABLET | Freq: Every day | ORAL | 3 refills | Status: DC

## 2020-10-01 NOTE — Patient Instructions (Signed)
Labs were performed today, if any labs are abnormal the clinic will call you  Your physician recommends that you schedule a follow-up appointment in: 3-4 weeks and then in 3 months  TAKE Torsemide 20 mg (1 tablet) daily for 3 days, then take as needed  milAt [facility], you and your health needs are our priority. As part of our continuing mission to provide you with exceptional heart care, we have created designated Provider Care Teams. These Care Teams include your primary Cardiologist (physician) and Advanced Practice Providers (APPs- Physician Assistants and Nurse Practitioners) who all work together to provide you with the care you need, when you need it.   You may see any of the following providers on your designated Care Team at your next follow up: Dr Glori Bickers Dr Loralie Champagne Dr Patrice Paradise, NP Lyda Jester, [place] Ginnie Smart Audry Riles, PharmD   Please be sure to bring in all your medications bottles to every appointment.    If you have any questions or concerns before your next appointment please send [place] a message through [place] or call our office at [phone].    TO LEAVE A MESSAGE FOR THE NURSE SELECT OPTION 2, PLEASE LEAVE A MESSAGE INCLUDING: YOUR NAME DATE OF BIRTH CALL BACK NUMBER REASON FOR CALL**this is important as we prioritize the call backs  YOU WILL RECEIVE A CALL BACK THE SAME DAY AS LONG AS YOU CALL BEFORE 4:00 PM

## 2020-10-01 NOTE — Progress Notes (Signed)
ReDS Vest / Clip - 10/01/20 1000       ReDS Vest / Clip   Station Marker C    Ruler Value 24.5    ReDS Value Range High volume overload    ReDS Actual Value 48

## 2020-10-02 ENCOUNTER — Ambulatory Visit (INDEPENDENT_AMBULATORY_CARE_PROVIDER_SITE_OTHER): Payer: BC Managed Care – PPO

## 2020-10-02 DIAGNOSIS — Z7901 Long term (current) use of anticoagulants: Secondary | ICD-10-CM

## 2020-10-02 DIAGNOSIS — I236 Thrombosis of atrium, auricular appendage, and ventricle as current complications following acute myocardial infarction: Secondary | ICD-10-CM

## 2020-10-02 DIAGNOSIS — I2102 ST elevation (STEMI) myocardial infarction involving left anterior descending coronary artery: Secondary | ICD-10-CM

## 2020-10-02 DIAGNOSIS — I4891 Unspecified atrial fibrillation: Secondary | ICD-10-CM | POA: Diagnosis not present

## 2020-10-02 LAB — POCT INR: INR: 3 (ref 2.0–3.0)

## 2020-10-02 NOTE — Patient Instructions (Signed)
-   try to have some greens in the next day or 2  - continue warfarin dosage of 3 tablets every day EXCEPT 2 TABLETS ON MONDAYS, [place].  - Keep consistent on leafy greens - Recheck INR in 4 weeks.

## 2020-10-03 ENCOUNTER — Other Ambulatory Visit (HOSPITAL_COMMUNITY): Payer: Self-pay | Admitting: Cardiology

## 2020-10-03 ENCOUNTER — Other Ambulatory Visit (HOSPITAL_COMMUNITY): Payer: Self-pay | Admitting: Adult Health

## 2020-10-30 ENCOUNTER — Ambulatory Visit (INDEPENDENT_AMBULATORY_CARE_PROVIDER_SITE_OTHER): Payer: BC Managed Care – PPO

## 2020-10-30 ENCOUNTER — Other Ambulatory Visit: Payer: Self-pay

## 2020-10-30 DIAGNOSIS — Z7901 Long term (current) use of anticoagulants: Secondary | ICD-10-CM

## 2020-10-30 DIAGNOSIS — I2102 ST elevation (STEMI) myocardial infarction involving left anterior descending coronary artery: Secondary | ICD-10-CM

## 2020-10-30 DIAGNOSIS — I4891 Unspecified atrial fibrillation: Secondary | ICD-10-CM | POA: Diagnosis not present

## 2020-10-30 DIAGNOSIS — I236 Thrombosis of atrium, auricular appendage, and ventricle as current complications following acute myocardial infarction: Secondary | ICD-10-CM | POA: Diagnosis not present

## 2020-10-30 LAB — POCT INR: INR: 6 — AB (ref 2.0–3.0)

## 2020-10-30 NOTE — Patient Instructions (Signed)
-   skip warfarin tonight, tomorrow & Friday, then - on Saturday, resume warfarin dosage of 3 tablets every day EXCEPT 2 TABLETS ON MONDAYS, [place].  - Keep consistent on leafy greens - Recheck INR in 2 weeks. Coumadin Clinic [facility] [phone].

## 2020-11-01 ENCOUNTER — Encounter (HOSPITAL_COMMUNITY): Payer: BC Managed Care – PPO

## 2020-11-07 NOTE — Progress Notes (Signed)
Advanced Heart Failure Clinic Progress Note   PCP: Dion Body, MD HF Cardiology: Dr. Aundra Dubin  60 y.o. with history of renal transplant in 2011, CAD s/p anterior MI, and ischemic cardiomyopathy presents for followup of CHF and CAD.  Patient had his renal transplant at [facility], and has been followed by [facility] as well as [facility] since that time.  He had been doing well until 10/21.  In early 10/21, he developed chest pain.  He did not go immediately to the ER, he presented when the pain had continued for > 1 day.  He was found to have acute anterior MI with late presentation. He went for cath, LAD was occluded and there was severe diffuse disease in the RCA as well as severe disease in the LCx system.  He had DES to the LAD.  He subsequently developed cardiogenic shock as well as suspected septic shock, possible from gut source.  Abdominal imaging showed profound ileus.  He was taken to the OR for Impella 5.5 placement, but this was deferred due to the finding of LV thrombus on intra-op TEE.  He also developed atrial fibrillation with RVR, controlled by amiodarone and eventually converted back to NSR.  He was maintained on milrinone 0.25 with gradual improvement.  However, he developed AKI, likely due to contrast as well as cardiorenal. Creatinine went up to 5.  Creatinine gradually improved down to 3.38 at discharge, and we were able to discontinue milrinone.  Ileus also gradually resolved.  Echo in the hospital showed EF 20-25%. He initially wore a Lifevest but has now sent it back.   Echo in 1/22 showed EF 30-35%, periapical akinesis, no LV thrombus, normal RV.  He saw EP to discuss ICD and atrial fibrillation ablation, but he was not ready for this yet.   Had clinic f/u w/ Dr. Aundra Dubin 2/22 and symptomatically was doing quite well. Was enrolled in cardiac rehab and able to run for a short distance on his treadmill w/o significant exertional dyspnea.  Denied chest pain,  orthopnea/PND and palpitations.  Given lack of CP and SCr persistently > 2, it was decided not to persure staged PCI of LCx. Continued medical therapy of CAD elected. GDMT further titrated.    He had echo on 06/19/20, and LVEF has improved to 40-45%. RV ok. Had f/u w/ Dr. Curt Bears to discuss afib ablation and ultimately decided to continue on amio and hold off on ablation.   Today he returns for HF follow up. He works full time in [place]. No significant exertional dyspnea. Overall feeling fine. Denies CP, dizziness, edema, or PND/Orthopnea. Appetite ok. No fever or chills. Weight at home 189 pounds. Taking all medications. He says >188 lbs he will take a fluid pill to get weight down, but has not taken any recently. BP at home 128/78.   ECG (personally reviewed): none ordered today.  Labs (10/21): K 4.3, creatinine 3.38 Labs (11/21): K 4.7, creatinine 2.24, LDL 68, HDL 43, hgb 11.9, LFTs normal Labs (12/21): K 3.9, creatinine 2.43 Labs (2/22): LDL 81, LFTs normal, K 3.7, creatinine 2.49 Labs (3/22): SCr 3.34, K 4.0, TSH normal  Labs (4/22): LDL 36, LFTs normal  Labs (6/22): K 3.6, creatinine 3.2 Labs (8/22): K 3.8, creatinine 3.5  PMH: 1. Type 2 diabetes 2. Atrial fibrillation: Paroxysmal, on amiodarone.  3. Renal transplant 2011 [facility].  4. GERD 5. 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 CAD: Late presentation anterior MI in 10/21.  Cath with  occluded LAD, long up to 99% mid-distal RCA stenosis, 95% mid-distal LCx, 80% OM3.  He had DES to LAD.   8. Chronic systolic CHF: Ischemic cardiomyopathy. Cardiogenic shock in 10/21 with MI.  - Echo (10/21): EF 20-25%, heavy smoke/early thrombus LV apex, RV normal.  - RHC (10/21, milrinone 0.25): mean RA 4, PA 22/4, mean PCWP 6, CI 2.06 Fick, CI 2.06 thermo.  - Echo (1/22): EF 30-35%, periapical akinesis, no LV thrombus, normal RV.  9. LV thrombus  SH: Married, nonsmoker, no ETOH.  Lives in [place]. 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   Family  History  Problem Relation Age of Onset   Diabetes Mother    Hyperlipidemia Mother    Cancer Father    ROS: all systems reviewed and negative except as per HPI.   Current Outpatient Medications  Medication Sig Dispense Refill   amiodarone (PACERONE) 200 MG tablet Take 0.5 tablets (100 mg total) by mouth daily. 90 tablet 2   carvedilol (COREG) 25 MG tablet Take 1 tablet (25 mg total) by mouth 2 (two) times daily. 60 tablet 11   clopidogrel (PLAVIX) 75 MG tablet TAKE 1 TABLET BY MOUTH EVERY DAY 90 tablet 3   ezetimibe (ZETIA) 10 MG tablet Take 1 tablet (10 mg total) by mouth daily. 30 tablet 11   glipiZIDE (GLUCOTROL) 10 MG tablet Take 10 mg by mouth 2 (two) times daily before a meal.     isosorbide-hydrALAZINE (BIDIL) 20-37.5 MG tablet Take 2 tablets by mouth 3 (three) times daily. 180 tablet 6   loratadine (CLARITIN) 10 MG tablet Take 10 mg by mouth daily as needed for allergies.     nitroGLYCERIN (NITROSTAT) 0.4 MG SL tablet Place 1 tablet (0.4 mg total) under the tongue every 5 (five) minutes as needed for chest pain. 100 tablet 3   pantoprazole (PROTONIX) 40 MG tablet TAKE 1 TABLET BY MOUTH EVERY DAY 30 tablet 0   predniSONE (DELTASONE) 5 MG tablet Take 1 tablet (5 mg total) by mouth daily with breakfast.     rosuvastatin (CRESTOR) 40 MG tablet TAKE 1 TABLET BY MOUTH EVERY DAY 90 tablet 3   simethicone (MYLICON) 0000000 MG chewable tablet Chew 125 mg by mouth every 6 (six) hours as needed for flatulence.     tacrolimus (PROGRAF) 1 MG capsule Take 3 mg by mouth 2 (two) times daily.     tiZANidine (ZANAFLEX) 4 MG tablet Take 1 tablet (4 mg total) by mouth 3 (three) times daily. 30 tablet 0   torsemide (DEMADEX) 20 MG tablet Take 1 tablet (20 mg total) by mouth daily. 30 tablet 3   warfarin (COUMADIN) 1 MG tablet TAKE 2 TO 3 TABLETS BY MOUTH AS DIRECTED BY ANTICOAGULATION CLINIC. 30 DAY SUPPLY PER MD 270 tablet 3   No current facility-administered medications for this encounter.   Wt Readings  from Last 3 Encounters:  11/08/20 88.5 kg (195 lb 3.2 oz)  10/01/20 91.4 kg (201 lb 6.4 oz)  07/17/20 86.2 kg (190 lb)   BP 132/80   Pulse 60   Wt 88.5 kg (195 lb 3.2 oz)   SpO2 98%   BMI 25.75 kg/m   PHYSICAL EXAM: General:  NAD. No resp difficulty HEENT: Normal Neck: Supple. No JVD. Carotids 2+ bilat; no bruits. No lymphadenopathy or thryomegaly appreciated. Cor: PMI nondisplaced. Regular rate & rhythm. No rubs, gallops or murmurs. Lungs: Clear Abdomen: Soft, nontender, nondistended. No hepatosplenomegaly. No bruits or masses. Good bowel sounds. Extremities: No cyanosis, clubbing, rash,  edema Neuro: Alert & oriented x 3, cranial nerves grossly intact. Moves all 4 extremities w/o difficulty. Affect pleasant.  Assessment/Plan: 1. CAD: S/p late presentation anterior MI in 10/21 with DES to LAD.  He has residual severe disease in the RCA which is not revascularizable.   He has 95% mid-distal LCx stenosis and 80% OM3 stenosis that could potentially be intervened upon.  Stable w/o anginal symptomatology.  - Given CKD w/ SCr >2.0 and lack of CP, will continue medical therapy for residual disease.  - Continue Plavix for up to 1 year post-PCI (~10/22).  He is now off ASA as he is also on warfarin.  Can stop Plavix at 6 months if he develops issues with bleeding.  - Continue statin, good lipids in 7/22. LDL 40 mg/dL. LFTs WNL  2. Atrial fibrillation: Paroxysmal.  He is in NSR today on EKG. Denies breakthrough symptoms w/ amio   - Continue amiodarone 100 mg daily.  Dose reduced due to elevated LFTs 7/22. Have now returned to normal. Will need regular eye exam.  - Continue warfarin.  INR followed by Coumadin clinic  - Followed by Dr. Curt Bears and patient decided to hold off on ablation and continue on amiodarone. 3. Chronic systolic CHF: Ischemic cardiomyopathy.  Echo in 10/21 with EF 20-25%.  Echo in 1/22 showed EF 30-35% with regional wall motion abnormalities.  Echo 06/19/20 LVEF improved to  40-45%. RV ok. NYHA I-II today, he is not volume overloaded on exam. - Continue torsemide 20 mg daily. BMET today. - Continue Bidil 2 tabs tid.   - Continue Coreg 25 mg bid.  - SGLT2i stopped recently by Nephrology. - No ARB/ARNi, spiro or dig w/ CKD.  - EF now out of range for ICD  4. LV thrombus: No thrombus on 1/22 echo.   - Now on warfarin. INRs followed by coumadin clinic  5. 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 Next appt 11/22.  - BMET today. 6. Type 2 diabetes: Now off SGLT2i. Managed with glipizide.  Follow up with Dr. Aundra Dubin in 2 months as scheduled.  [place], [place] 11/08/2020

## 2020-11-08 ENCOUNTER — Ambulatory Visit (HOSPITAL_COMMUNITY)
Admission: RE | Admit: 2020-11-08 | Discharge: 2020-11-08 | Disposition: A | Discharge status: Home / Self Care | Payer: BC Managed Care – PPO | Source: Ambulatory Visit | Attending: Family Medicine | Admitting: Family Medicine

## 2020-11-08 ENCOUNTER — Other Ambulatory Visit (HOSPITAL_COMMUNITY): Payer: Self-pay | Admitting: Surgery

## 2020-11-08 ENCOUNTER — Other Ambulatory Visit: Payer: Self-pay

## 2020-11-08 ENCOUNTER — Encounter (HOSPITAL_COMMUNITY): Payer: Self-pay

## 2020-11-08 ENCOUNTER — Telehealth (HOSPITAL_COMMUNITY): Payer: Self-pay | Admitting: Surgery

## 2020-11-08 VITALS — BP 132/80 | HR 60 | Wt 195.2 lb

## 2020-11-08 DIAGNOSIS — I5022 Chronic systolic (congestive) heart failure: Secondary | ICD-10-CM

## 2020-11-08 DIAGNOSIS — I255 Ischemic cardiomyopathy: Secondary | ICD-10-CM | POA: Insufficient documentation

## 2020-11-08 DIAGNOSIS — I252 Old myocardial infarction: Secondary | ICD-10-CM | POA: Insufficient documentation

## 2020-11-08 DIAGNOSIS — E1122 Type 2 diabetes mellitus with diabetic chronic kidney disease: Secondary | ICD-10-CM | POA: Diagnosis not present

## 2020-11-08 DIAGNOSIS — Z940 Kidney transplant status: Secondary | ICD-10-CM | POA: Diagnosis not present

## 2020-11-08 DIAGNOSIS — I236 Thrombosis of atrium, auricular appendage, and ventricle as current complications following acute myocardial infarction: Secondary | ICD-10-CM | POA: Diagnosis not present

## 2020-11-08 DIAGNOSIS — Z79899 Other long term (current) drug therapy: Secondary | ICD-10-CM | POA: Diagnosis not present

## 2020-11-08 DIAGNOSIS — I4891 Unspecified atrial fibrillation: Secondary | ICD-10-CM | POA: Diagnosis not present

## 2020-11-08 DIAGNOSIS — Z955 Presence of coronary angioplasty implant and graft: Secondary | ICD-10-CM | POA: Diagnosis not present

## 2020-11-08 DIAGNOSIS — I130 Hypertensive heart and chronic kidney disease with heart failure and stage 1 through stage 4 chronic kidney disease, or unspecified chronic kidney disease: Secondary | ICD-10-CM | POA: Diagnosis not present

## 2020-11-08 DIAGNOSIS — Z7984 Long term (current) use of oral hypoglycemic drugs: Secondary | ICD-10-CM | POA: Diagnosis not present

## 2020-11-08 DIAGNOSIS — I2510 Atherosclerotic heart disease of native coronary artery without angina pectoris: Secondary | ICD-10-CM | POA: Diagnosis not present

## 2020-11-08 DIAGNOSIS — Z7952 Long term (current) use of systemic steroids: Secondary | ICD-10-CM | POA: Diagnosis not present

## 2020-11-08 DIAGNOSIS — N183 Chronic kidney disease, stage 3 unspecified: Secondary | ICD-10-CM | POA: Diagnosis not present

## 2020-11-08 DIAGNOSIS — Z7902 Long term (current) use of antithrombotics/antiplatelets: Secondary | ICD-10-CM | POA: Diagnosis not present

## 2020-11-08 DIAGNOSIS — Z7901 Long term (current) use of anticoagulants: Secondary | ICD-10-CM | POA: Insufficient documentation

## 2020-11-08 DIAGNOSIS — I480 Paroxysmal atrial fibrillation: Secondary | ICD-10-CM | POA: Diagnosis not present

## 2020-11-08 LAB — BASIC METABOLIC PANEL
Anion gap: 10 (ref 5–15)
BUN: 43 mg/dL — ABNORMAL HIGH (ref 6–20)
CO2: 23 mmol/L (ref 22–32)
Calcium: 9.7 mg/dL (ref 8.9–10.3)
Chloride: 107 mmol/L (ref 98–111)
Creatinine, Ser: 3.54 mg/dL — ABNORMAL HIGH (ref 0.61–1.24)
GFR, Estimated: 19 mL/min — ABNORMAL LOW (ref >60)
Glucose, Bld: 231 mg/dL — ABNORMAL HIGH (ref 70–99)
Potassium: 3.7 mmol/L (ref 3.5–5.1)
Sodium: 140 mmol/L (ref 135–145)

## 2020-11-08 MED ORDER — TORSEMIDE 20 MG PO TABS: 20.0000 mg | ORAL_TABLET | ORAL | 3 refills | Status: DC

## 2020-11-08 NOTE — Telephone Encounter (Signed)
Patient medication changes updated in [facility] per provider.  Patient to return on October 7th to [facility] for repeat labwork.

## 2020-11-08 NOTE — Patient Instructions (Addendum)
Labs done today. We will contact you only if your labs are abnormal.  No medication changes were made. Please continue all current medications as prescribed.  Your physician recommends that you keep your scheduled pending appointment.   If you have any questions or concerns before your next appointment please send [place] a message through [place] or call our office at [phone].    TO LEAVE A MESSAGE FOR THE NURSE SELECT OPTION 2, PLEASE LEAVE A MESSAGE INCLUDING: YOUR NAME DATE OF BIRTH CALL BACK NUMBER REASON FOR CALL**this is important as we prioritize the call backs  YOU WILL RECEIVE A CALL BACK THE SAME DAY AS LONG AS YOU CALL BEFORE 4:00 PM   Do the following things EVERYDAY: Weigh yourself in the morning before breakfast. Write it down and keep it in a log. Take your medicines as prescribed Eat low salt foods--Limit salt (sodium) to 2000 mg per day.  Stay as active as you can everyday Limit all fluids for the day to less than 2 liters   At the [facility], you and your health needs are our priority. As part of our continuing mission to provide you with exceptional heart care, we have created designated Provider Care Teams. These Care Teams include your primary Cardiologist (physician) and Advanced Practice Providers (APPs- Physician Assistants and Nurse Practitioners) who all work together to provide you with the care you need, when you need it.   You may see any of the following providers on your designated Care Team at your next follow up: Dr Glori Bickers Dr Haynes Kerns, NP Lyda Jester, [place] Audry Riles, PharmD   Please be sure to bring in all your medications bottles to every appointment.

## 2020-11-13 ENCOUNTER — Other Ambulatory Visit: Payer: Self-pay

## 2020-11-13 ENCOUNTER — Ambulatory Visit (INDEPENDENT_AMBULATORY_CARE_PROVIDER_SITE_OTHER): Payer: BC Managed Care – PPO

## 2020-11-13 DIAGNOSIS — I2102 ST elevation (STEMI) myocardial infarction involving left anterior descending coronary artery: Secondary | ICD-10-CM

## 2020-11-13 DIAGNOSIS — I236 Thrombosis of atrium, auricular appendage, and ventricle as current complications following acute myocardial infarction: Secondary | ICD-10-CM | POA: Diagnosis not present

## 2020-11-13 DIAGNOSIS — I4891 Unspecified atrial fibrillation: Secondary | ICD-10-CM

## 2020-11-13 DIAGNOSIS — Z7901 Long term (current) use of anticoagulants: Secondary | ICD-10-CM

## 2020-11-13 LAB — POCT INR: INR: 1.5 — AB (ref 2.0–3.0)

## 2020-11-13 NOTE — Patient Instructions (Signed)
-   take 4 mg warfarin tonight, then -  resume warfarin dosage of 3 tablets every day EXCEPT 2 TABLETS ON MONDAYS, [place].  - Keep consistent on leafy greens - Recheck INR in 2 weeks.

## 2020-11-22 ENCOUNTER — Ambulatory Visit (HOSPITAL_COMMUNITY)
Admission: RE | Admit: 2020-11-22 | Discharge: 2020-11-22 | Disposition: A | Discharge status: Home / Self Care | Payer: BC Managed Care – PPO | Source: Ambulatory Visit | Attending: Internal Medicine | Admitting: Internal Medicine

## 2020-11-22 ENCOUNTER — Other Ambulatory Visit: Payer: Self-pay

## 2020-11-22 DIAGNOSIS — I5022 Chronic systolic (congestive) heart failure: Secondary | ICD-10-CM | POA: Diagnosis not present

## 2020-11-22 LAB — BASIC METABOLIC PANEL
Anion gap: 6 (ref 5–15)
BUN: 36 mg/dL — ABNORMAL HIGH (ref 6–20)
CO2: 23 mmol/L (ref 22–32)
Calcium: 9.5 mg/dL (ref 8.9–10.3)
Chloride: 107 mmol/L (ref 98–111)
Creatinine, Ser: 3.32 mg/dL — ABNORMAL HIGH (ref 0.61–1.24)
GFR, Estimated: 20 mL/min — ABNORMAL LOW (ref >60)
Glucose, Bld: 175 mg/dL — ABNORMAL HIGH (ref 70–99)
Potassium: 3.9 mmol/L (ref 3.5–5.1)
Sodium: 136 mmol/L (ref 135–145)

## 2020-11-29 ENCOUNTER — Ambulatory Visit (INDEPENDENT_AMBULATORY_CARE_PROVIDER_SITE_OTHER): Payer: BC Managed Care – PPO | Admitting: *Deleted

## 2020-11-29 ENCOUNTER — Other Ambulatory Visit: Payer: Self-pay

## 2020-11-29 DIAGNOSIS — Z7901 Long term (current) use of anticoagulants: Secondary | ICD-10-CM | POA: Diagnosis not present

## 2020-11-29 DIAGNOSIS — I4891 Unspecified atrial fibrillation: Secondary | ICD-10-CM | POA: Diagnosis not present

## 2020-11-29 DIAGNOSIS — I2102 ST elevation (STEMI) myocardial infarction involving left anterior descending coronary artery: Secondary | ICD-10-CM

## 2020-11-29 DIAGNOSIS — I236 Thrombosis of atrium, auricular appendage, and ventricle as current complications following acute myocardial infarction: Secondary | ICD-10-CM

## 2020-11-29 LAB — POCT INR: INR: 2.6 (ref 2.0–3.0)

## 2020-11-29 MED ORDER — WARFARIN SODIUM 1 MG PO TABS: ORAL_TABLET | ORAL | 1 refills | Status: DC

## 2020-11-29 NOTE — Patient Instructions (Signed)
Description   Continue taking warfarin dosage of 3 tablets every day EXCEPT 2 TABLETS ON MONDAYS, [place]. Keep consistent on leafy greens. Recheck INR in 3 weeks. Coumadin Clinic [facility] [phone].

## 2020-12-07 ENCOUNTER — Other Ambulatory Visit (HOSPITAL_COMMUNITY): Payer: Self-pay | Admitting: Adult Health

## 2020-12-18 ENCOUNTER — Other Ambulatory Visit: Payer: Self-pay

## 2020-12-18 ENCOUNTER — Ambulatory Visit (INDEPENDENT_AMBULATORY_CARE_PROVIDER_SITE_OTHER): Payer: BC Managed Care – PPO

## 2020-12-18 DIAGNOSIS — I2102 ST elevation (STEMI) myocardial infarction involving left anterior descending coronary artery: Secondary | ICD-10-CM

## 2020-12-18 DIAGNOSIS — Z7901 Long term (current) use of anticoagulants: Secondary | ICD-10-CM | POA: Diagnosis not present

## 2020-12-18 DIAGNOSIS — I4891 Unspecified atrial fibrillation: Secondary | ICD-10-CM

## 2020-12-18 DIAGNOSIS — I236 Thrombosis of atrium, auricular appendage, and ventricle as current complications following acute myocardial infarction: Secondary | ICD-10-CM

## 2020-12-18 LAB — POCT INR: INR: 4 — AB (ref 2.0–3.0)

## 2020-12-18 NOTE — Patient Instructions (Signed)
-   skip warfarin tonight, then - Continue taking warfarin dosage of 3 tablets every day EXCEPT 2 TABLETS ON MONDAYS, [place].  - Keep consistent on leafy greens.  - Recheck INR in 3 weeks.  Coumadin Clinic [facility] [phone].

## 2021-01-03 ENCOUNTER — Encounter (HOSPITAL_COMMUNITY): Payer: BC Managed Care – PPO | Admitting: Cardiology

## 2021-01-08 ENCOUNTER — Ambulatory Visit (INDEPENDENT_AMBULATORY_CARE_PROVIDER_SITE_OTHER): Payer: BC Managed Care – PPO

## 2021-01-08 ENCOUNTER — Other Ambulatory Visit: Payer: Self-pay

## 2021-01-08 DIAGNOSIS — I236 Thrombosis of atrium, auricular appendage, and ventricle as current complications following acute myocardial infarction: Secondary | ICD-10-CM | POA: Diagnosis not present

## 2021-01-08 DIAGNOSIS — Z7901 Long term (current) use of anticoagulants: Secondary | ICD-10-CM

## 2021-01-08 DIAGNOSIS — I4891 Unspecified atrial fibrillation: Secondary | ICD-10-CM | POA: Diagnosis not present

## 2021-01-08 DIAGNOSIS — I2102 ST elevation (STEMI) myocardial infarction involving left anterior descending coronary artery: Secondary | ICD-10-CM | POA: Diagnosis not present

## 2021-01-08 LAB — POCT INR: INR: 1.3 — AB (ref 2.0–3.0)

## 2021-01-08 NOTE — Patient Instructions (Signed)
-   take 4 tablets warfarin tonight, then - Continue taking warfarin dosage of 3 tablets every day EXCEPT 2 TABLETS ON MONDAYS, [place].  - Keep consistent on leafy greens.  - Recheck INR in 2 weeks.

## 2021-01-21 ENCOUNTER — Other Ambulatory Visit (HOSPITAL_COMMUNITY): Payer: Self-pay | Admitting: Adult Health

## 2021-01-22 ENCOUNTER — Ambulatory Visit (INDEPENDENT_AMBULATORY_CARE_PROVIDER_SITE_OTHER): Payer: BC Managed Care – PPO

## 2021-01-22 ENCOUNTER — Other Ambulatory Visit: Payer: Self-pay

## 2021-01-22 DIAGNOSIS — I236 Thrombosis of atrium, auricular appendage, and ventricle as current complications following acute myocardial infarction: Secondary | ICD-10-CM

## 2021-01-22 DIAGNOSIS — Z7901 Long term (current) use of anticoagulants: Secondary | ICD-10-CM

## 2021-01-22 DIAGNOSIS — I4891 Unspecified atrial fibrillation: Secondary | ICD-10-CM | POA: Diagnosis not present

## 2021-01-22 DIAGNOSIS — I2102 ST elevation (STEMI) myocardial infarction involving left anterior descending coronary artery: Secondary | ICD-10-CM

## 2021-01-22 LAB — POCT INR: INR: 4.7 — AB (ref 2.0–3.0)

## 2021-01-22 NOTE — Patient Instructions (Signed)
-   skip warfarin tonight & tomorrow, then  - Continue taking warfarin dosage of 3 tablets every day EXCEPT 2 TABLETS ON MONDAYS, [place].  - Keep consistent on leafy greens.  - Recheck INR in 2 weeks.

## 2021-01-30 ENCOUNTER — Other Ambulatory Visit (HOSPITAL_COMMUNITY): Payer: Self-pay | Admitting: Cardiology

## 2021-02-05 ENCOUNTER — Other Ambulatory Visit: Payer: Self-pay

## 2021-02-05 ENCOUNTER — Ambulatory Visit (INDEPENDENT_AMBULATORY_CARE_PROVIDER_SITE_OTHER): Payer: BC Managed Care – PPO | Admitting: *Deleted

## 2021-02-05 DIAGNOSIS — I4891 Unspecified atrial fibrillation: Secondary | ICD-10-CM | POA: Diagnosis not present

## 2021-02-05 DIAGNOSIS — Z7901 Long term (current) use of anticoagulants: Secondary | ICD-10-CM | POA: Diagnosis not present

## 2021-02-05 DIAGNOSIS — I236 Thrombosis of atrium, auricular appendage, and ventricle as current complications following acute myocardial infarction: Secondary | ICD-10-CM

## 2021-02-05 DIAGNOSIS — Z5181 Encounter for therapeutic drug level monitoring: Secondary | ICD-10-CM | POA: Diagnosis not present

## 2021-02-05 DIAGNOSIS — I2102 ST elevation (STEMI) myocardial infarction involving left anterior descending coronary artery: Secondary | ICD-10-CM

## 2021-02-05 LAB — POCT INR: INR: 3.6 — AB (ref 2.0–3.0)

## 2021-02-05 NOTE — Patient Instructions (Signed)
Description   -Hold warfarin today -Then START taking warfarin 2 tablets daily except for 3 tablets on Sundays, Tuesdays and Thursdays.  -Recheck INR in 2 weeks Coumadin Clinic [facility] [phone].

## 2021-02-17 ENCOUNTER — Other Ambulatory Visit (HOSPITAL_COMMUNITY): Payer: Self-pay | Admitting: Adult Health

## 2021-02-19 ENCOUNTER — Other Ambulatory Visit: Payer: Self-pay

## 2021-02-19 ENCOUNTER — Ambulatory Visit (INDEPENDENT_AMBULATORY_CARE_PROVIDER_SITE_OTHER): Payer: BC Managed Care – PPO

## 2021-02-19 DIAGNOSIS — I2102 ST elevation (STEMI) myocardial infarction involving left anterior descending coronary artery: Secondary | ICD-10-CM

## 2021-02-19 DIAGNOSIS — I236 Thrombosis of atrium, auricular appendage, and ventricle as current complications following acute myocardial infarction: Secondary | ICD-10-CM

## 2021-02-19 DIAGNOSIS — I4891 Unspecified atrial fibrillation: Secondary | ICD-10-CM

## 2021-02-19 DIAGNOSIS — Z7901 Long term (current) use of anticoagulants: Secondary | ICD-10-CM

## 2021-02-19 LAB — POCT INR: INR: 2.5 (ref 2.0–3.0)

## 2021-02-19 NOTE — Patient Instructions (Signed)
-   continue taking warfarin 2 tablets daily except for 3 tablets on Sundays, Tuesdays and Thursdays.  -Recheck INR in 4 weeks Coumadin Clinic [facility] [phone].

## 2021-02-27 ENCOUNTER — Ambulatory Visit (HOSPITAL_COMMUNITY)
Admission: RE | Admit: 2021-02-27 | Discharge: 2021-02-27 | Disposition: A | Discharge status: Home / Self Care | Payer: BC Managed Care – PPO | Source: Ambulatory Visit | Attending: Cardiology | Admitting: Cardiology

## 2021-02-27 ENCOUNTER — Other Ambulatory Visit: Payer: Self-pay

## 2021-02-27 ENCOUNTER — Encounter (HOSPITAL_COMMUNITY): Payer: Self-pay | Admitting: Cardiology

## 2021-02-27 VITALS — BP 104/60 | HR 68 | Wt 195.2 lb

## 2021-02-27 DIAGNOSIS — I480 Paroxysmal atrial fibrillation: Secondary | ICD-10-CM | POA: Insufficient documentation

## 2021-02-27 DIAGNOSIS — Z940 Kidney transplant status: Secondary | ICD-10-CM | POA: Diagnosis not present

## 2021-02-27 DIAGNOSIS — E1122 Type 2 diabetes mellitus with diabetic chronic kidney disease: Secondary | ICD-10-CM | POA: Diagnosis not present

## 2021-02-27 DIAGNOSIS — I252 Old myocardial infarction: Secondary | ICD-10-CM | POA: Diagnosis not present

## 2021-02-27 DIAGNOSIS — I509 Heart failure, unspecified: Secondary | ICD-10-CM | POA: Diagnosis not present

## 2021-02-27 DIAGNOSIS — N183 Chronic kidney disease, stage 3 unspecified: Secondary | ICD-10-CM | POA: Insufficient documentation

## 2021-02-27 DIAGNOSIS — I513 Intracardiac thrombosis, not elsewhere classified: Secondary | ICD-10-CM | POA: Insufficient documentation

## 2021-02-27 DIAGNOSIS — Z7902 Long term (current) use of antithrombotics/antiplatelets: Secondary | ICD-10-CM | POA: Diagnosis not present

## 2021-02-27 DIAGNOSIS — Z7901 Long term (current) use of anticoagulants: Secondary | ICD-10-CM | POA: Insufficient documentation

## 2021-02-27 DIAGNOSIS — I2510 Atherosclerotic heart disease of native coronary artery without angina pectoris: Secondary | ICD-10-CM | POA: Diagnosis not present

## 2021-02-27 DIAGNOSIS — I130 Hypertensive heart and chronic kidney disease with heart failure and stage 1 through stage 4 chronic kidney disease, or unspecified chronic kidney disease: Secondary | ICD-10-CM | POA: Insufficient documentation

## 2021-02-27 DIAGNOSIS — Z79899 Other long term (current) drug therapy: Secondary | ICD-10-CM | POA: Insufficient documentation

## 2021-02-27 DIAGNOSIS — I5022 Chronic systolic (congestive) heart failure: Secondary | ICD-10-CM | POA: Diagnosis not present

## 2021-02-27 LAB — COMPREHENSIVE METABOLIC PANEL
ALT: 34 U/L (ref 0–44)
AST: 21 U/L (ref 15–41)
Albumin: 3.5 g/dL (ref 3.5–5.0)
Alkaline Phosphatase: 46 U/L (ref 38–126)
Anion gap: 10 (ref 5–15)
BUN: 44 mg/dL — ABNORMAL HIGH (ref 6–20)
CO2: 23 mmol/L (ref 22–32)
Calcium: 9.6 mg/dL (ref 8.9–10.3)
Chloride: 109 mmol/L (ref 98–111)
Creatinine, Ser: 3.35 mg/dL — ABNORMAL HIGH (ref 0.61–1.24)
GFR, Estimated: 20 mL/min — ABNORMAL LOW (ref >60)
Glucose, Bld: 219 mg/dL — ABNORMAL HIGH (ref 70–99)
Potassium: 4 mmol/L (ref 3.5–5.1)
Sodium: 142 mmol/L (ref 135–145)
Total Bilirubin: 0.5 mg/dL (ref 0.3–1.2)
Total Protein: 5.9 g/dL — ABNORMAL LOW (ref 6.5–8.1)

## 2021-02-27 LAB — TSH: TSH: 0.781 u[IU]/mL (ref 0.350–4.500)

## 2021-02-27 NOTE — Patient Instructions (Signed)
STOP Plavix  Labs today We will only contact you if something comes back abnormal or we need to make some changes. Otherwise no news is good news!  Your physician has requested that you have an echocardiogram. Echocardiography is a painless test that uses sound waves to create images of your heart. It provides your doctor with information about the size and shape of your heart and how well your hearts chambers and valves are working. This procedure takes approximately one hour. There are no restrictions for this procedure.  Your physician recommends that you schedule a follow-up appointment in: May 2023 for an ECHO and visit with Dr Aundra Dubin.   Please call office at [phone] option [phone] if you have any questions or concerns.   At the [facility], you and your health needs are our priority. As part of our continuing mission to provide you with exceptional heart care, we have created designated Provider Care Teams. These Care Teams include your primary Cardiologist (physician) and Advanced Practice Providers (APPs- Physician Assistants and Nurse Practitioners) who all work together to provide you with the care you need, when you need it.   You may see any of the following providers on your designated Care Team at your next follow up: Dr Glori Bickers Dr Haynes Kerns, NP Lyda Jester, [place] [facility] [place], [place] Audry Riles, PharmD   Please be sure to bring in all your medications bottles to every appointment.

## 2021-02-27 NOTE — Progress Notes (Signed)
PCP: Dion Body, MD Cardiology: Dr. Aundra Dubin  61 y.o. with history of renal transplant in 2011, CAD s/p anterior MI, and ischemic cardiomyopathy presents for followup of CHF and CAD.  Patient had his renal transplant at [facility], and has been followed by [facility] as well as [facility] since that time.  He had been doing well until 10/21.  In early 10/21, he developed chest pain.  He did not go immediately to the ER, he presented when the pain had continued for > 1 day.  He was found to have acute anterior MI with late presentation. He went for cath, LAD was occluded and there was severe diffuse disease in the RCA as well as severe disease in the LCx system.  He had DES to the LAD.  He subsequently developed cardiogenic shock as well as suspected septic shock, possible from gut source.  Abdominal imaging showed profound ileus.  He was taken to the OR for Impella 5.5 placement, but this was deferred due to the finding of LV thrombus on intra-op TEE.  He also developed atrial fibrillation with RVR, controlled by amiodarone and eventually converted back to NSR.  He was maintained on milrinone 0.25 with gradual improvement.  However, he developed AKI, likely due to contrast as well as cardiorenal. Creatinine went up to 5.  Creatinine gradually improved down to 3.38 at discharge, and we were able to discontinue milrinone.  Ileus also gradually resolved.  Echo in the hospital showed EF 20-25%. He initially wore a Lifevest but has now sent it back.   Echo in 1/22 showed EF 30-35%, periapical akinesis, no LV thrombus, normal RV.  He saw EP to discuss ICD and atrial fibrillation ablation, but he was not ready for this yet.  However, repeat echo in 5/22 showed EF up to 40-45% with normal RV function.   Patient returns for followup.  He has been taking torsemide prn, using about once a month.  No significant exertional dyspnea.  He has no limitations, walks for exercise and also jogs for short distances.   No chest pain.  No palpitations.  Weight is stable.    Labs (10/21): K 4.3, creatinine 3.38 Labs (11/21): K 4.7, creatinine 2.24, LDL 68, HDL 43, hgb 11.9, LFTs normal Labs (12/21): K 3.9, creatinine 2.43 Labs (2/22): LDL 81, LFTs normal, K 3.7, creatinine 2.49 Labs (8/22): LDL 72, TGs 109 Labs (12/22): LFTs normal, K 3.9, creatinine 3.2  ECG (personally reviewed): NSR, old inferior MI, old anterior MI  PMH: 1. Type 2 diabetes 2. Atrial fibrillation: Paroxysmal, on amiodarone.  3. Renal transplant 2011 [facility].  4. GERD 5. 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 CAD: Late presentation anterior MI in 10/21.  Cath with occluded LAD, long up to 99% mid-distal RCA stenosis, 95% mid-distal LCx, 80% OM3.  He had DES to LAD.   8. Chronic systolic CHF: Ischemic cardiomyopathy. Cardiogenic shock in 10/21 with MI.  - Echo (10/21): EF 20-25%, heavy smoke/early thrombus LV apex, RV normal.  - RHC (10/21, milrinone 0.25): mean RA 4, PA 22/4, mean PCWP 6, CI 2.06 Fick, CI 2.06 thermo.  - Echo (1/22): EF 30-35%, periapical akinesis, no LV thrombus, normal RV.  - Echo (5/22): EF 40-45% with periapical akinesis, normal RV.  9. LV thrombus  SH: Married, nonsmoker, no ETOH.  Lives in [place]. 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   Family History  Problem Relation Age of Onset   Diabetes Mother    Hyperlipidemia Mother  Cancer Father    ROS: all systems reviewed and negative except as per HPI.   Current Outpatient Medications  Medication Sig Dispense Refill   amiodarone (PACERONE) 200 MG tablet Take 0.5 tablets (100 mg total) by mouth daily. 90 tablet 2   carvedilol (COREG) 25 MG tablet Take 1 tablet (25 mg total) by mouth 2 (two) times daily. 60 tablet 11   ezetimibe (ZETIA) 10 MG tablet Take 1 tablet (10 mg total) by mouth daily. 30 tablet 11   glipiZIDE (GLUCOTROL) 10 MG tablet Take 10 mg by mouth 2 (two) times daily before a meal.     isosorbide-hydrALAZINE (BIDIL) 20-37.5 MG tablet Take 2  tablets by mouth 3 (three) times daily. 540 tablet 3   loratadine (CLARITIN) 10 MG tablet Take 10 mg by mouth daily as needed for allergies.     nitroGLYCERIN (NITROSTAT) 0.4 MG SL tablet Place 1 tablet (0.4 mg total) under the tongue every 5 (five) minutes as needed for chest pain. 100 tablet 3   pantoprazole (PROTONIX) 40 MG tablet TAKE 1 TABLET BY MOUTH EVERY DAY 30 tablet 0   predniSONE (DELTASONE) 5 MG tablet Take 1 tablet (5 mg total) by mouth daily with breakfast.     rosuvastatin (CRESTOR) 40 MG tablet TAKE 1 TABLET BY MOUTH EVERY DAY 90 tablet 3   simethicone (MYLICON) 176 MG chewable tablet Chew 125 mg by mouth every 6 (six) hours as needed for flatulence.     tacrolimus (PROGRAF) 1 MG capsule Take 3 mg by mouth 2 (two) times daily.     tiZANidine (ZANAFLEX) 4 MG tablet Take 1 tablet (4 mg total) by mouth 3 (three) times daily. 30 tablet 0   torsemide (DEMADEX) 20 MG tablet Take 1 tablet (20 mg total) by mouth every other day. 30 tablet 3   warfarin (COUMADIN) 1 MG tablet TAKE 2 TO 3 TABLETS BY MOUTH AS DIRECTED BY ANTICOAGULATION CLINIC. 30 DAY SUPPLY PER MD 240 tablet 1   No current facility-administered medications for this encounter.   BP 104/60    Pulse 68    Wt 88.5 kg (195 lb 3.2 oz)    SpO2 97%    BMI 25.75 kg/m  General: NAD Neck: No JVD, no thyromegaly or thyroid nodule.  Lungs: Clear to auscultation bilaterally with normal respiratory effort. CV: Nondisplaced PMI.  Heart regular S1/S2, no S3/S4, no murmur.  No peripheral edema.  No carotid bruit.  Normal pedal pulses.  Abdomen: Soft, nontender, no hepatosplenomegaly, no distention.  Skin: Intact without lesions or rashes.  Neurologic: Alert and oriented x 3.  Psych: Normal affect. Extremities: No clubbing or cyanosis.  HEENT: Normal.   Assessment/Plan: 1. CAD: S/p late presentation anterior MI in 10/21 with DES to LAD.  He has residual severe disease in the RCA which is not revascularizable.   He has 95% mid-distal  LCx stenosis and 80% OM3 stenosis that could potentially be intervened upon.  No chest pain.  - He has been on Plavix > 1 year post MI.  Stop today, continue warfarin. - Continue statin, good lipids in 7/22.  - Medical management planned for LCx system given elevated creatinine and no chest pain.  2. Atrial fibrillation: Paroxysmal.  He is in NSR today.  - Continue amiodarone 100 mg daily.  Check LFTs and TSH today, will need regular eye exam.  - Continue warfarin.  - Recommended atrial fibrillation ablation so he can safely stop amiodarone. Dr. Curt Bears has seen him and offered ablation.  He has not decided whether he wants to do this.   3. Chronic systolic CHF: Ischemic cardiomyopathy.  Echo in 10/21 with EF 20-25%.  Echo in 1/22 showed EF 30-35% with regional wall motion abnormalities.  Echo in 5/22 with EF up to 40-45%.  Not volume overloaded on exam, NYHA class I-II.   - He can continue to use torsemide prn.  - Continue Bidil 2 tabs tid.  - Continue Coreg 25 mg bid.  - No ARNI/spironolactone/Farxiga with creatinine > 3.  - EF now out of ICD range.  - I will repeat echo at followup in 5/23.  4. LV thrombus: No thrombus on 5/22 echo.   - Continue warfarin.  5. 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   Followup in 5/23 with echo.    Loralie Champagne 02/27/2021

## 2021-03-01 ENCOUNTER — Other Ambulatory Visit (HOSPITAL_COMMUNITY): Payer: Self-pay | Admitting: Cardiology

## 2021-03-19 ENCOUNTER — Ambulatory Visit (INDEPENDENT_AMBULATORY_CARE_PROVIDER_SITE_OTHER): Payer: BC Managed Care – PPO

## 2021-03-19 ENCOUNTER — Other Ambulatory Visit: Payer: Self-pay

## 2021-03-19 DIAGNOSIS — I4891 Unspecified atrial fibrillation: Secondary | ICD-10-CM | POA: Diagnosis not present

## 2021-03-19 DIAGNOSIS — Z7901 Long term (current) use of anticoagulants: Secondary | ICD-10-CM | POA: Diagnosis not present

## 2021-03-19 DIAGNOSIS — I236 Thrombosis of atrium, auricular appendage, and ventricle as current complications following acute myocardial infarction: Secondary | ICD-10-CM | POA: Diagnosis not present

## 2021-03-19 DIAGNOSIS — I2102 ST elevation (STEMI) myocardial infarction involving left anterior descending coronary artery: Secondary | ICD-10-CM | POA: Diagnosis not present

## 2021-03-19 LAB — POCT INR: INR: 1.9 — AB (ref 2.0–3.0)

## 2021-03-19 NOTE — Patient Instructions (Signed)
-   START NEW DOSAGE warfarin 3 tablets daily except for 2 tablets on MONDAYS, [place] -Recheck INR in 4 weeks Coumadin Clinic [facility] [phone].

## 2021-03-23 ENCOUNTER — Other Ambulatory Visit (HOSPITAL_COMMUNITY): Payer: Self-pay | Admitting: Cardiology

## 2021-03-23 ENCOUNTER — Other Ambulatory Visit (HOSPITAL_COMMUNITY): Payer: Self-pay | Admitting: Adult Health

## 2021-04-14 ENCOUNTER — Other Ambulatory Visit (HOSPITAL_COMMUNITY): Payer: Self-pay | Admitting: Adult Health

## 2021-04-14 ENCOUNTER — Other Ambulatory Visit (HOSPITAL_COMMUNITY): Payer: Self-pay | Admitting: Family Medicine

## 2021-04-14 ENCOUNTER — Other Ambulatory Visit: Payer: Self-pay | Admitting: Cardiology

## 2021-04-15 NOTE — Telephone Encounter (Signed)
Request for warfarin refill: Last OV 10/01/20  Rudi Heap FNP Last INR 1.9 on 03/19/21 Refill approved

## 2021-04-23 ENCOUNTER — Ambulatory Visit (INDEPENDENT_AMBULATORY_CARE_PROVIDER_SITE_OTHER): Payer: BC Managed Care – PPO

## 2021-04-23 ENCOUNTER — Other Ambulatory Visit: Payer: Self-pay

## 2021-04-23 DIAGNOSIS — I2102 ST elevation (STEMI) myocardial infarction involving left anterior descending coronary artery: Secondary | ICD-10-CM | POA: Diagnosis not present

## 2021-04-23 DIAGNOSIS — I4891 Unspecified atrial fibrillation: Secondary | ICD-10-CM

## 2021-04-23 DIAGNOSIS — Z7901 Long term (current) use of anticoagulants: Secondary | ICD-10-CM

## 2021-04-23 DIAGNOSIS — I236 Thrombosis of atrium, auricular appendage, and ventricle as current complications following acute myocardial infarction: Secondary | ICD-10-CM | POA: Diagnosis not present

## 2021-04-23 LAB — POCT INR: INR: 3.6 — AB (ref 2.0–3.0)

## 2021-04-23 NOTE — Patient Instructions (Signed)
-   skip warfarin tonight, the ?- tomorrow resume warfarin 3 tablets daily except for 2 tablets on MONDAYS, WEDNESDAYS & FRIDAYS ?- Get your greens back on schedule ?-Recheck INR in 4 weeks ?Coumadin Clinic [facility] [phone]. ?

## 2021-04-29 ENCOUNTER — Other Ambulatory Visit: Payer: Self-pay | Admitting: Cardiology

## 2021-05-08 ENCOUNTER — Other Ambulatory Visit (HOSPITAL_COMMUNITY): Payer: Self-pay | Admitting: Adult Health

## 2021-05-21 ENCOUNTER — Ambulatory Visit (INDEPENDENT_AMBULATORY_CARE_PROVIDER_SITE_OTHER): Payer: BC Managed Care – PPO

## 2021-05-21 DIAGNOSIS — Z7901 Long term (current) use of anticoagulants: Secondary | ICD-10-CM

## 2021-05-21 DIAGNOSIS — I2102 ST elevation (STEMI) myocardial infarction involving left anterior descending coronary artery: Secondary | ICD-10-CM | POA: Diagnosis not present

## 2021-05-21 DIAGNOSIS — I236 Thrombosis of atrium, auricular appendage, and ventricle as current complications following acute myocardial infarction: Secondary | ICD-10-CM

## 2021-05-21 DIAGNOSIS — I4891 Unspecified atrial fibrillation: Secondary | ICD-10-CM

## 2021-05-21 LAB — POCT INR: INR: 5.5 — AB (ref 2.0–3.0)

## 2021-05-21 NOTE — Patient Instructions (Signed)
-   skip warfarin tonight & tomorrow, then ?- START NEW DOSAGE of warfarin 2 tablets daily except for 3 tablets on MONDAYS & FRIDAYS ?-Recheck INR in 3 weeks ?Coumadin Clinic [facility] [phone]. ?

## 2021-05-23 ENCOUNTER — Other Ambulatory Visit: Payer: Self-pay | Admitting: Cardiology

## 2021-06-04 ENCOUNTER — Emergency Department: Payer: BC Managed Care – PPO

## 2021-06-04 ENCOUNTER — Encounter: Payer: Self-pay | Admitting: Emergency Medicine

## 2021-06-04 ENCOUNTER — Other Ambulatory Visit: Payer: Self-pay

## 2021-06-04 ENCOUNTER — Inpatient Hospital Stay
Admission: EM | Admit: 2021-06-04 | Discharge: 2021-06-06 | DRG: 194 | Disposition: A | Discharge status: Home / Self Care | Payer: BC Managed Care – PPO | Attending: Internal Medicine | Admitting: Internal Medicine

## 2021-06-04 DIAGNOSIS — Z8349 Family history of other endocrine, nutritional and metabolic diseases: Secondary | ICD-10-CM

## 2021-06-04 DIAGNOSIS — J189 Pneumonia, unspecified organism: Secondary | ICD-10-CM | POA: Diagnosis not present

## 2021-06-04 DIAGNOSIS — R079 Chest pain, unspecified: Secondary | ICD-10-CM | POA: Diagnosis not present

## 2021-06-04 DIAGNOSIS — T8619 Other complication of kidney transplant: Secondary | ICD-10-CM | POA: Diagnosis present

## 2021-06-04 DIAGNOSIS — I480 Paroxysmal atrial fibrillation: Secondary | ICD-10-CM | POA: Diagnosis present

## 2021-06-04 DIAGNOSIS — I255 Ischemic cardiomyopathy: Secondary | ICD-10-CM | POA: Diagnosis present

## 2021-06-04 DIAGNOSIS — R0902 Hypoxemia: Secondary | ICD-10-CM | POA: Diagnosis present

## 2021-06-04 DIAGNOSIS — I252 Old myocardial infarction: Secondary | ICD-10-CM

## 2021-06-04 DIAGNOSIS — Y830 Surgical operation with transplant of whole organ as the cause of abnormal reaction of the patient, or of later complication, without mention of misadventure at the time of the procedure: Secondary | ICD-10-CM | POA: Diagnosis present

## 2021-06-04 DIAGNOSIS — E1122 Type 2 diabetes mellitus with diabetic chronic kidney disease: Secondary | ICD-10-CM | POA: Diagnosis present

## 2021-06-04 DIAGNOSIS — E1165 Type 2 diabetes mellitus with hyperglycemia: Secondary | ICD-10-CM | POA: Diagnosis present

## 2021-06-04 DIAGNOSIS — Z7984 Long term (current) use of oral hypoglycemic drugs: Secondary | ICD-10-CM | POA: Diagnosis not present

## 2021-06-04 DIAGNOSIS — I5032 Chronic diastolic (congestive) heart failure: Secondary | ICD-10-CM | POA: Diagnosis present

## 2021-06-04 DIAGNOSIS — I130 Hypertensive heart and chronic kidney disease with heart failure and stage 1 through stage 4 chronic kidney disease, or unspecified chronic kidney disease: Secondary | ICD-10-CM | POA: Diagnosis present

## 2021-06-04 DIAGNOSIS — I2510 Atherosclerotic heart disease of native coronary artery without angina pectoris: Secondary | ICD-10-CM | POA: Diagnosis present

## 2021-06-04 DIAGNOSIS — I248 Other forms of acute ischemic heart disease: Secondary | ICD-10-CM | POA: Diagnosis present

## 2021-06-04 DIAGNOSIS — D849 Immunodeficiency, unspecified: Secondary | ICD-10-CM | POA: Diagnosis present

## 2021-06-04 DIAGNOSIS — R739 Hyperglycemia, unspecified: Secondary | ICD-10-CM

## 2021-06-04 DIAGNOSIS — Z20822 Contact with and (suspected) exposure to covid-19: Secondary | ICD-10-CM | POA: Diagnosis present

## 2021-06-04 DIAGNOSIS — K219 Gastro-esophageal reflux disease without esophagitis: Secondary | ICD-10-CM | POA: Diagnosis present

## 2021-06-04 DIAGNOSIS — Z79899 Other long term (current) drug therapy: Secondary | ICD-10-CM

## 2021-06-04 DIAGNOSIS — Z7952 Long term (current) use of systemic steroids: Secondary | ICD-10-CM | POA: Diagnosis not present

## 2021-06-04 DIAGNOSIS — Z833 Family history of diabetes mellitus: Secondary | ICD-10-CM

## 2021-06-04 DIAGNOSIS — R778 Other specified abnormalities of plasma proteins: Secondary | ICD-10-CM

## 2021-06-04 DIAGNOSIS — N184 Chronic kidney disease, stage 4 (severe): Secondary | ICD-10-CM | POA: Diagnosis present

## 2021-06-04 DIAGNOSIS — Z7901 Long term (current) use of anticoagulants: Secondary | ICD-10-CM

## 2021-06-04 DIAGNOSIS — N179 Acute kidney failure, unspecified: Secondary | ICD-10-CM | POA: Diagnosis present

## 2021-06-04 DIAGNOSIS — J9601 Acute respiratory failure with hypoxia: Principal | ICD-10-CM

## 2021-06-04 LAB — CBC
HCT: 40.7 % (ref 39.0–52.0)
HCT: 41.6 % (ref 39.0–52.0)
Hemoglobin: 12.7 g/dL — ABNORMAL LOW (ref 13.0–17.0)
Hemoglobin: 12.9 g/dL — ABNORMAL LOW (ref 13.0–17.0)
MCH: 26.7 pg (ref 26.0–34.0)
MCH: 26.7 pg (ref 26.0–34.0)
MCHC: 31.2 g/dL (ref 30.0–36.0)
MCHC: 31 g/dL (ref 30.0–36.0)
MCV: 85.7 fL (ref 80.0–100.0)
MCV: 86 fL (ref 80.0–100.0)
Platelets: 155 10*3/uL (ref 150–400)
Platelets: 149 10*3/uL — ABNORMAL LOW (ref 150–400)
RBC: 4.75 MIL/uL (ref 4.22–5.81)
RBC: 4.84 MIL/uL (ref 4.22–5.81)
RDW: 13.9 % (ref 11.5–15.5)
RDW: 14 % (ref 11.5–15.5)
WBC: 8.1 10*3/uL (ref 4.0–10.5)
WBC: 6 10*3/uL (ref 4.0–10.5)
nRBC: 0 % (ref 0.0–0.2)
nRBC: 0 % (ref 0.0–0.2)

## 2021-06-04 LAB — HEMOGLOBIN A1C
Hgb A1c MFr Bld: 9.1 % — ABNORMAL HIGH (ref 4.8–5.6)
Mean Plasma Glucose: 214.47 mg/dL

## 2021-06-04 LAB — LACTIC ACID, PLASMA
Lactic Acid, Venous: 0.8 mmol/L (ref 0.5–1.9)
Lactic Acid, Venous: 0.9 mmol/L (ref 0.5–1.9)

## 2021-06-04 LAB — HEPATIC FUNCTION PANEL
ALT: 62 U/L — ABNORMAL HIGH (ref 0–44)
AST: 30 U/L (ref 15–41)
Albumin: 3.1 g/dL — ABNORMAL LOW (ref 3.5–5.0)
Alkaline Phosphatase: 70 U/L (ref 38–126)
Bilirubin, Direct: 0.2 mg/dL (ref 0.0–0.2)
Indirect Bilirubin: 0.7 mg/dL (ref 0.3–0.9)
Total Bilirubin: 0.9 mg/dL (ref 0.3–1.2)
Total Protein: 6.4 g/dL — ABNORMAL LOW (ref 6.5–8.1)

## 2021-06-04 LAB — GLUCOSE, CAPILLARY
Glucose-Capillary: 261 mg/dL — ABNORMAL HIGH (ref 70–99)
Glucose-Capillary: 322 mg/dL — ABNORMAL HIGH (ref 70–99)
Glucose-Capillary: 182 mg/dL — ABNORMAL HIGH (ref 70–99)

## 2021-06-04 LAB — BASIC METABOLIC PANEL
Anion gap: 9 (ref 5–15)
BUN: 34 mg/dL — ABNORMAL HIGH (ref 6–20)
CO2: 23 mmol/L (ref 22–32)
Calcium: 9.5 mg/dL (ref 8.9–10.3)
Chloride: 107 mmol/L (ref 98–111)
Creatinine, Ser: 3.11 mg/dL — ABNORMAL HIGH (ref 0.61–1.24)
GFR, Estimated: 22 mL/min — ABNORMAL LOW (ref >60)
Glucose, Bld: 298 mg/dL — ABNORMAL HIGH (ref 70–99)
Potassium: 4.1 mmol/L (ref 3.5–5.1)
Sodium: 139 mmol/L (ref 135–145)

## 2021-06-04 LAB — RESP PANEL BY RT-PCR (FLU A&B, COVID) ARPGX2
Influenza A by PCR: NEGATIVE
Influenza B by PCR: NEGATIVE
SARS Coronavirus 2 by RT PCR: NEGATIVE

## 2021-06-04 LAB — APTT: aPTT: 41 seconds — ABNORMAL HIGH (ref 24–36)

## 2021-06-04 LAB — PROTIME-INR
INR: 1.4 — ABNORMAL HIGH (ref 0.8–1.2)
Prothrombin Time: 16.7 seconds — ABNORMAL HIGH (ref 11.4–15.2)

## 2021-06-04 LAB — TROPONIN I (HIGH SENSITIVITY)
Troponin I (High Sensitivity): 30 ng/L — ABNORMAL HIGH (ref <18)
Troponin I (High Sensitivity): 32 ng/L — ABNORMAL HIGH (ref <18)

## 2021-06-04 LAB — BRAIN NATRIURETIC PEPTIDE: B Natriuretic Peptide: 206 pg/mL — ABNORMAL HIGH (ref 0.0–100.0)

## 2021-06-04 LAB — CREATININE, SERUM
Creatinine, Ser: 3.27 mg/dL — ABNORMAL HIGH (ref 0.61–1.24)
GFR, Estimated: 21 mL/min — ABNORMAL LOW (ref >60)

## 2021-06-04 LAB — HIV ANTIBODY (ROUTINE TESTING W REFLEX): HIV Screen 4th Generation wRfx: NONREACTIVE

## 2021-06-04 LAB — D-DIMER, QUANTITATIVE: D-Dimer, Quant: 0.45 ug/mL-FEU (ref 0.00–0.50)

## 2021-06-04 MED ORDER — ACETAMINOPHEN 650 MG RE SUPP: 650.0000 mg | Freq: Four times a day (QID) | RECTAL | Status: DC | PRN

## 2021-06-04 MED ORDER — PNEUMOCOCCAL 20-VAL CONJ VACC 0.5 ML IM SUSY
0.5000 mL | PREFILLED_SYRINGE | INTRAMUSCULAR | Status: DC | PRN
Start: 2021-06-04 — End: 2021-06-06
  Filled 2021-06-04: qty 0.5

## 2021-06-04 MED ORDER — WARFARIN - PHARMACIST DOSING INPATIENT
Freq: Every day | Status: DC
Start: 2021-06-05 — End: 2021-06-06

## 2021-06-04 MED ORDER — INSULIN ASPART 100 UNIT/ML IJ SOLN
0.0000 [IU] | Freq: Three times a day (TID) | INTRAMUSCULAR | Status: DC
  Administered 2021-06-04: 7 [IU] via SUBCUTANEOUS
  Administered 2021-06-05: 9 [IU] via SUBCUTANEOUS
  Administered 2021-06-05: 1 [IU] via SUBCUTANEOUS
  Administered 2021-06-05 – 2021-06-06 (×2): 3 [IU] via SUBCUTANEOUS
  Filled 2021-06-04 (×6): qty 1

## 2021-06-04 MED ORDER — SODIUM CHLORIDE 0.9 % IV SOLN
1.0000 g | Freq: Once | INTRAVENOUS | Status: AC
  Administered 2021-06-04: 1 g via INTRAVENOUS
  Filled 2021-06-04: qty 10

## 2021-06-04 MED ORDER — SODIUM CHLORIDE 0.9 % IV SOLN
1.0000 g | INTRAVENOUS | Status: DC
  Administered 2021-06-05: 1 g via INTRAVENOUS
  Filled 2021-06-04 (×2): qty 10

## 2021-06-04 MED ORDER — EZETIMIBE 10 MG PO TABS
10.0000 mg | ORAL_TABLET | Freq: Every day | ORAL | Status: DC
  Administered 2021-06-05 – 2021-06-06 (×2): 10 mg via ORAL
  Filled 2021-06-04 (×2): qty 1

## 2021-06-04 MED ORDER — ASPIRIN 81 MG PO CHEW
324.0000 mg | CHEWABLE_TABLET | Freq: Once | ORAL | Status: AC
Start: 2021-06-04 — End: 2021-06-04
  Administered 2021-06-04: 324 mg via ORAL
  Filled 2021-06-04: qty 4

## 2021-06-04 MED ORDER — ISOSORB DINITRATE-HYDRALAZINE 20-37.5 MG PO TABS
2.0000 | ORAL_TABLET | Freq: Three times a day (TID) | ORAL | Status: DC
  Administered 2021-06-04 – 2021-06-06 (×6): 2 via ORAL
  Filled 2021-06-04 (×7): qty 2

## 2021-06-04 MED ORDER — SODIUM CHLORIDE 0.9 % IV SOLN: 250.0000 mL | INTRAVENOUS | Status: DC | PRN

## 2021-06-04 MED ORDER — HEPARIN SODIUM (PORCINE) 5000 UNIT/ML IJ SOLN
5000.0000 [IU] | Freq: Three times a day (TID) | INTRAMUSCULAR | Status: DC
  Administered 2021-06-04 – 2021-06-06 (×6): 5000 [IU] via SUBCUTANEOUS
  Filled 2021-06-04 (×6): qty 1

## 2021-06-04 MED ORDER — PREDNISONE 10 MG PO TABS
5.0000 mg | ORAL_TABLET | Freq: Every day | ORAL | Status: DC
  Administered 2021-06-05 – 2021-06-06 (×2): 5 mg via ORAL
  Filled 2021-06-04 (×2): qty 1

## 2021-06-04 MED ORDER — SODIUM CHLORIDE 0.9 % IV SOLN
500.0000 mg | INTRAVENOUS | Status: DC
  Administered 2021-06-05: 500 mg via INTRAVENOUS
  Filled 2021-06-04 (×2): qty 5

## 2021-06-04 MED ORDER — POLYETHYLENE GLYCOL 3350 17 G PO PACK: 17.0000 g | PACK | Freq: Every day | ORAL | Status: DC | PRN

## 2021-06-04 MED ORDER — ACETAMINOPHEN 500 MG PO TABS
1000.0000 mg | ORAL_TABLET | Freq: Once | ORAL | Status: AC
  Administered 2021-06-04: 1000 mg via ORAL
  Filled 2021-06-04: qty 2

## 2021-06-04 MED ORDER — SODIUM CHLORIDE 0.9% FLUSH: 3.0000 mL | INTRAVENOUS | Status: DC | PRN

## 2021-06-04 MED ORDER — WARFARIN SODIUM 5 MG PO TABS
5.0000 mg | ORAL_TABLET | Freq: Once | ORAL | Status: AC
  Administered 2021-06-04: 5 mg via ORAL
  Filled 2021-06-04: qty 1

## 2021-06-04 MED ORDER — TACROLIMUS 1 MG PO CAPS
3.0000 mg | ORAL_CAPSULE | Freq: Two times a day (BID) | ORAL | Status: DC
  Administered 2021-06-04 – 2021-06-06 (×4): 3 mg via ORAL
  Filled 2021-06-04 (×4): qty 3

## 2021-06-04 MED ORDER — ACETAMINOPHEN 325 MG PO TABS: 650.0000 mg | ORAL_TABLET | Freq: Four times a day (QID) | ORAL | Status: DC | PRN

## 2021-06-04 MED ORDER — ROSUVASTATIN CALCIUM 20 MG PO TABS
10.0000 mg | ORAL_TABLET | Freq: Every day | ORAL | Status: DC
  Administered 2021-06-05 – 2021-06-06 (×2): 10 mg via ORAL
  Filled 2021-06-04 (×2): qty 0.5

## 2021-06-04 MED ORDER — SODIUM CHLORIDE 0.9% FLUSH
3.0000 mL | Freq: Two times a day (BID) | INTRAVENOUS | Status: DC
  Administered 2021-06-04 – 2021-06-06 (×4): 3 mL via INTRAVENOUS

## 2021-06-04 MED ORDER — AMIODARONE HCL 200 MG PO TABS
100.0000 mg | ORAL_TABLET | Freq: Every day | ORAL | Status: DC
  Administered 2021-06-05 – 2021-06-06 (×2): 100 mg via ORAL
  Filled 2021-06-04 (×2): qty 1

## 2021-06-04 MED ORDER — CARVEDILOL 25 MG PO TABS
25.0000 mg | ORAL_TABLET | Freq: Two times a day (BID) | ORAL | Status: DC
  Administered 2021-06-04 – 2021-06-06 (×4): 25 mg via ORAL
  Filled 2021-06-04 (×4): qty 1

## 2021-06-04 MED ORDER — TORSEMIDE 20 MG PO TABS
20.0000 mg | ORAL_TABLET | ORAL | Status: DC
  Administered 2021-06-06: 20 mg via ORAL
  Filled 2021-06-04 (×2): qty 1

## 2021-06-04 MED ORDER — SODIUM CHLORIDE 0.9 % IV SOLN
500.0000 mg | Freq: Once | INTRAVENOUS | Status: AC
  Administered 2021-06-04: 500 mg via INTRAVENOUS
  Filled 2021-06-04: qty 5

## 2021-06-04 NOTE — ED Notes (Signed)
First Nurse Note:  Pt to ED via POV for cough, congestion, and shortness of breath. Pt is having pain in his chest when coughing. Pt is in NAD.  ?

## 2021-06-04 NOTE — ED Triage Notes (Signed)
Pt comes into the ED via POV c/o chest pain and cough.  Pt states the pain is on the right side of his chest. Pt states symptoms started on Monday.  Denies any COPD, or cardiac problems.  Pt placed on 4L O2.  ?

## 2021-06-04 NOTE — ED Provider Notes (Signed)
? ?[facility] ?Provider Note ? ? ? Event Date/Time  ? First MD Initiated Contact with Patient 06/04/21 1135   ?  (approximate) ? ? ?History  ? ?Cough and Chest Pain ? ? ?HPI ? ?Casey Lieu Reierson Brooke Bonito. is a 61 y.o. male  with history of renal transplant in 2011, CAD s/p anterior MI, and ischemic cardiomyopathy, GERD, HTN, DM, CHF on torsemide, A-fib on Coumadin and amiodarone and DM who presents for evaluation of 3 days of cough, shortness of breath and substernal and right-sided chest discomfort.  No history of tobacco abuse, COPD, asthma or any other lung pathology.  Patient denies any fevers, headache or earache, sore throat, vomiting, diarrhea, abdominal pain, back pain rash or extremity pain.  No clear eliciting or rating factors.  No other acute concerns at this time. ? ? ?Past Medical History:  ?Diagnosis Date  ? CHF (congestive heart failure) ([place])   ? Chronic kidney disease 04/2009  ? Kidney Transplant  ? Diabetes mellitus   ? GERD (gastroesophageal reflux disease)   ? as needed reflux  ? Heart attack ([place]) 02/02/2020  ? Hypertension   ? 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  ? ? ? ?  ? ? ?Physical Exam  ?Triage Vital Signs: ?ED Triage Vitals  ?Enc Vitals Group  ?   BP 06/04/21 1117 100/70  ?   Pulse Rate 06/04/21 1117 72  ?   Resp 06/04/21 1117 18  ?   Temp 06/04/21 1117 98.9 ?F (37.2 ?C)  ?   Temp Source 06/04/21 1117 Oral  ?   SpO2 06/04/21 1117 (!) 86 %  ?   Weight 06/04/21 1123 195 lb 1.7 oz (88.5 kg)  ?   Height 06/04/21 1123 6\' 1"  (1.854 m)  ?   Head Circumference --   ?   Peak Flow --   ?   Pain Score 06/04/21 1122 4  ?   Pain Loc --   ?   Pain Edu? --   ?   Excl. in [place]? --   ? ? ?Most recent vital signs: ?Vitals:  ? 06/04/21 1400 06/04/21 1430  ?BP: (!) 138/97 135/84  ?Pulse: 69 65  ?Resp: (!) 24 20  ?Temp:    ?SpO2: 96% 94%  ? ? ?General: Awake, ill-appearing. ?CV:  Good peripheral perfusion.  2+ radial pulse. ?Resp:  Normal effort.  Tachypneic with  bilateral rhonchi. ?Abd:  No distention.  Soft. ?Other:  No significant lower extreme edema. ? ? ?ED Results / Procedures / Treatments  ?Labs ?(all labs ordered are listed, but only abnormal results are displayed) ?Labs Reviewed  ?BASIC METABOLIC PANEL - Abnormal; Notable for the following components:  ?    Result Value  ? Glucose, Bld 298 (*)   ? BUN 34 (*)   ? Creatinine, Ser 3.11 (*)   ? GFR, Estimated 22 (*)   ? All other components within normal limits  ?CBC - Abnormal; Notable for the following components:  ? Hemoglobin 12.7 (*)   ? All other components within normal limits  ?PROTIME-INR - Abnormal; Notable for the following components:  ? Prothrombin Time 16.7 (*)   ? INR 1.4 (*)   ? All other components within normal limits  ?APTT - Abnormal; Notable for the following components:  ? aPTT 41 (*)   ? All other components within normal limits  ?BRAIN NATRIURETIC PEPTIDE - Abnormal; Notable for the following components:  ?  B Natriuretic Peptide 206.0 (*)   ? All other components within normal limits  ?HEPATIC FUNCTION PANEL - Abnormal; Notable for the following components:  ? Total Protein 6.4 (*)   ? Albumin 3.1 (*)   ? ALT 62 (*)   ? All other components within normal limits  ?TROPONIN I (HIGH SENSITIVITY) - Abnormal; Notable for the following components:  ? Troponin I (High Sensitivity) 30 (*)   ? All other components within normal limits  ?TROPONIN I (HIGH SENSITIVITY) - Abnormal; Notable for the following components:  ? Troponin I (High Sensitivity) 32 (*)   ? All other components within normal limits  ?RESP PANEL BY RT-PCR (FLU A&B, COVID) [id number]  ?CULTURE, BLOOD (ROUTINE X 2)  ?CULTURE, BLOOD (ROUTINE X 2)  ?LACTIC ACID, PLASMA  ?D-DIMER, QUANTITATIVE  ?TACROLIMUS LEVEL  ?LACTIC ACID, PLASMA  ? ? ? ?EKG ? ?ECG is markable sinus rhythm with a ventricular rate of 73, left axis deviation, otherwise unremarkable intervals with some diffuse nonspecific ST changes in inferior and lateral leads as well as  V2. ? ? ?RADIOLOGY ?Chest reviewed by myself shows no focal consoidation, effusion, edema, pneumothorax or other clear acute thoracic process. I also reviewed radiology interpretation and agree with findings described. ? ?CT chest without contrast on my review shows some bilateral interstitial opacities more in the posterior lung fields concerning for pneumonia.  I reviewed radiology interpretation and agree with their findings of some opacities concerning for interstitial pneumonia without evidence of effusion, pneumothorax or overt edema.  I agree with notation of extensive CAD as well as ectasia of the main pulmonary artery suggesting pulmonary hypertension and thyroid nodule. ? ?PROCEDURES: ? ?Critical Care performed: Yes, see critical care procedure note(s) ? ?.Critical Care ?Performed by: Lucrezia Starch, MD ?Authorized by: Lucrezia Starch, MD  ? ?Critical care provider statement:  ?  Critical care time (minutes):  30 ?  Critical care was necessary to treat or prevent imminent or life-threatening deterioration of the following conditions:  Respiratory failure ?  Critical care was time spent personally by me on the following activities:  Development of treatment plan with patient or surrogate, discussions with consultants, evaluation of patient's response to treatment, examination of patient, ordering and review of laboratory studies, ordering and review of radiographic studies, ordering and performing treatments and interventions, pulse oximetry, re-evaluation of patient's condition and review of old charts ?.1-3 Lead EKG Interpretation ?Performed by: Lucrezia Starch, MD ?Authorized by: Lucrezia Starch, MD  ? ?  Interpretation: normal   ?  ECG rate assessment: normal   ?  Rhythm: sinus rhythm   ?  Ectopy: none   ?  Conduction: normal   ? ?The patient is on the cardiac monitor to evaluate for evidence of arrhythmia and/or significant heart rate changes. ? ? ?MEDICATIONS ORDERED IN ED: ?Medications   ?azithromycin (ZITHROMAX) 500 mg in sodium chloride 0.9 % 250 mL IVPB (has no administration in time range)  ?acetaminophen (TYLENOL) tablet 1,000 mg (1,000 mg Oral Given 06/04/21 1242)  ?aspirin chewable tablet 324 mg (324 mg Oral Given 06/04/21 1407)  ?cefTRIAXone (ROCEPHIN) 1 g in sodium chloride 0.9 % 100 mL IVPB (0 g Intravenous Stopped 06/04/21 1507)  ? ? ? ?IMPRESSION / MDM / ASSESSMENT AND PLAN / ED COURSE  ?I reviewed the triage vital signs and the nursing notes. ?             ?               ? ?  Differential diagnosis includes, but is not limited to pneumonia, bronchitis, symptomatic pleural effusion, CHF, PE, ACS and myocarditis. ? ?ECG is markable sinus rhythm with a ventricular rate of 73, left axis deviation, otherwise unremarkable intervals with some diffuse nonspecific ST changes in inferior and lateral leads as well as V2.  Troponins are stable and flat at 30 and 32 and overall have a low suspicion for an occlusion MI and suspect some chronic troponinemia versus mild demand ischemia in the setting of respiratory failure. ? ?Chest reviewed by myself shows no focal consoidation, effusion, edema, pneumothorax or other clear acute thoracic process. I also reviewed radiology interpretation and agree with findings described. ? ?CT chest without contrast on my review shows some bilateral interstitial opacities more in the posterior lung fields concerning for pneumonia.  I reviewed radiology interpretation and agree with their findings of some opacities concerning for interstitial pneumonia without evidence of effusion, pneumothorax or overt edema.  I agree with notation of extensive CAD as well as ectasia of the main pulmonary artery suggesting pulmonary hypertension and thyroid nodule. ? ?BMP is remarkable for hyperglycemia with a glucose of 289, BUN of 34 and a creatinine of 3.11 compared to 3.353 months ago.  No other acute electrolyte or metabolic derangements on BMP.  CBC without leukocytosis and  hemoglobin of 12.7 compared to 11.81-year ago.  Normal platelets.  INR subtherapeutic at 1.4 and thus dimer was obtained to assess risk for PE.  Given D-dimer 0.455 low suspicion for PE at this time.  Lactic acid nonelevate

## 2021-06-04 NOTE — Consult Note (Addendum)
ANTICOAGULATION CONSULT NOTE - Initial Consult ? ?Pharmacy Consult for warfarin ?Indication: atrial fibrillation ? ?No Known Allergies ? ?Patient Measurements: ?Height: 6\' 1"  (185.4 cm) ?Weight: 88.5 kg (195 lb 1.7 oz) ?IBW/kg (Calculated) : 79.9 ? ? ?Vital Signs: ?Temp: 98.7 ?F (37.1 ?C) (04/20 0746) ?Temp Source: Oral (04/20 0746) ?BP: 137/90 (04/20 0746) ?Pulse Rate: 73 (04/20 0746) ? ?Labs: ?Recent Labs  ?  06/04/21 ?1140 06/04/21 ?1403 06/04/21 ?1630 06/05/21 ?8984  ?HGB 12.7*  --  12.9* 12.7*  ?HCT 40.7  --  41.6 40.6  ?PLT 155  --  149* 152  ?APTT 41*  --   --   --   ?LABPROT 16.7*  --   --  19.4*  ?INR 1.4*  --   --  1.7*  ?CREATININE 3.11*  --  3.27* 3.08*  ?TROPONINIHS 30* 32*  --   --   ? ? ?Estimated Creatinine Clearance: 28.8 mL/min (A) (by C-G formula based on SCr of 3.08 mg/dL (H)). ? ? ?Medical History: ?Past Medical History:  ?Diagnosis Date  ? CHF (congestive heart failure) ([place])   ? Chronic kidney disease 04/2009  ? Kidney Transplant  ? Diabetes mellitus   ? GERD (gastroesophageal reflux disease)   ? as needed reflux  ? Heart attack ([place]) 02/02/2020  ? Hypertension   ? 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  ? ? ?Medications:  ?Warfarin PTA Regimen: ?3mg  Mon, Fri; 2mg  rest of the week (per anticoag visit summary on 05/21/21) ?Total weekly dose 16mg  ? ?Assessment: ? 61 y.o. male s/p renal transplant 2011, PAF on warfarin chronic therapy with regimen as above presented to [facility] 06/04/21 with cough and chest pain. Pharmacy consulted to manage warfarin therapy while in patient. INR noted to be subtherapeutic at 1.4 (Goal 2-3) on admission.  ? ?Date INR Warfarin Dose  ?4/18 -- 3 mg  ?4/19 1.4 5 mg  ?4/20 1.7 5 mg ? ?DDIs: ?Amiodarone 100 mg daily -- PTA/chronic med ?4/19 Ceftriaxone 1 gram + Azithromycin 500 mg daily >  ? ?Goal of Therapy:  ?INR 2-3 ?Monitor platelets by anticoagulation protocol: Yes ?  ?Plan:  ?INR remains subtherapeutic D2 of admission. ?Will order 4mg  x1  tonight then can likely resume home regimen once INR at goal. ?Continue to monitor INR daily  ? ?Darrick Penna, PharmD, MS PGPM ?Clinical Pharmacist ?06/05/2021 ?9:05 AM ? ? ? ?

## 2021-06-04 NOTE — H&P (Signed)
? ? ?History and Physical:  ? ? ?Casey [name].  ? ?XBJ:[id number] DOB: 14-Dec-1960 DOA: 06/04/2021 ? ?Referring MD/provider: Hulan Saas ?PCP: Dion Body, MD  ? ?Patient coming from: Home ? ?Chief Complaint: Cough ? ?History of Present Illness:  ? ?Casey [name]. is an 61 y.o. male s/p renal transplant 2011, CKD 3, DM 2, CAD and HFrEF, PAF was in his USOH until 3 days ago when he developed a cough productive of scant phlegm.  Patient subsequently developed some dyspnea on exertion and malaise.  No fevers or chills.  Patient does admit to some intermittent right-sided chest pain that is not exertional.  Patient denies orthopnea or PND. ? ?Patient was worked up in the ED and found to be afebrile and normotensive.  He was noted to have a new oxygen requirement with O2 sats 86 to 88% on room air, patient does not have a history of COPD or any oxygen requirement.  Chest x-ray was negative although chest CT without contrast did show mild bilateral haziness which may be early pneumonia.  Patient denies tobacco use.  Patient was treated with ceftriaxone azithromycin and called in for admission. ? ? ?ROS:  ? ?ROS  ? ?Review of Systems: ?As per HPI ? ? ?Past Medical History:  ? ?Past Medical History:  ?Diagnosis Date  ? CHF (congestive heart failure) ([place])   ? Chronic kidney disease 04/2009  ? Kidney Transplant  ? Diabetes mellitus   ? GERD (gastroesophageal reflux disease)   ? as needed reflux  ? Heart attack ([place]) 02/02/2020  ? Hypertension   ? 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  ? ? ?Past Surgical History:  ? ?Past Surgical History:  ?Procedure Laterality Date  ? AV FISTULA PLACEMENT  03/09/2011  ? 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 CORONARY/GRAFT ACUTE MI REVASCULARIZATION N/A 12/03/2019  ? Procedure: Coronary/Graft Acute MI  Revascularization;  Surgeon: Wellington Hampshire, MD;  Location: [place] [facility];  Service: Cardiovascular;  Laterality: N/A;  ? DIALYSIS FISTULA CREATION    ? last used 04/2009  ? INSERTION OF DIALYSIS CATHETER    ? cordis dialysis catheter placement  ? KIDNEY TRANSPLANT  2011  ? LEFT HEART CATH AND CORONARY ANGIOGRAPHY N/A 12/03/2019  ? Procedure: LEFT HEART CATH AND CORONARY ANGIOGRAPHY;  Surgeon: Wellington Hampshire, MD;  Location: [place];  Service: Cardiovascular;  Laterality: N/A;  ? RIGHT HEART CATH N/A 12/05/2019  ? Procedure: RIGHT HEART CATH;  Surgeon: Larey Dresser, MD;  Location: [place] [facility];  Service: Cardiovascular;  Laterality: N/A;  ? ? ?Social History:  ? ?Social History  ? ?Socioeconomic History  ? Marital status: Married  ?  Spouse name: Not on file  ? Number of children: Not on file  ? Years of education: Not on file  ? Highest education level: Not on file  ?Occupational History  ? Not on file  ?Tobacco Use  ? Smoking status: Never  ? Smokeless tobacco: Never  ?Vaping Use  ? Vaping Use: Never used  ?Substance and Sexual Activity  ? Alcohol use: Yes  ?  Comment: occassi  ? Drug use: No  ? Sexual activity: Not on file  ?Other Topics Concern  ? Not on file  ?Social History Narrative  ? Not on  file  ? ?Social Determinants of Health  ? ?Financial Resource Strain: Not on file  ?Food Insecurity: Not on file  ?Transportation Needs: Not on file  ?Physical Activity: Not on file  ?Stress: Not on file  ?Social Connections: Not on file  ?Intimate Partner Violence: Not on file  ? ? ?Allergies  ? ?Patient has no known allergies. ? ?Family history:  ? ?Family History  ?Problem Relation Age of Onset  ? Diabetes Mother   ? Hyperlipidemia Mother   ? Cancer Father   ? ? ?Current Medications:  ? ?Prior to Admission medications   ?Medication Sig Start Date End Date Taking? Authorizing Provider  ?amiodarone (PACERONE) 200 MG tablet TAKE 1 TABLET BY MOUTH EVERY DAY 03/03/21   Larey Dresser, MD   ?BIDIL 20-37.5 MG tablet TAKE 2 TABLETS BY MOUTH 3 TIMES DAILY. 03/03/21   Larey Dresser, MD  ?carvedilol (COREG) 25 MG tablet TAKE 1 TABLET BY MOUTH TWICE A DAY 03/03/21   Larey Dresser, MD  ?ezetimibe (ZETIA) 10 MG tablet Take 1 tablet (10 mg total) by mouth daily. 05/15/20 05/15/21  Larey Dresser, MD  ?glipiZIDE (GLUCOTROL) 10 MG tablet Take 10 mg by mouth 2 (two) times daily before a meal.    [provider]  ?loratadine (CLARITIN) 10 MG tablet Take 10 mg by mouth daily as needed for allergies.    [provider]  ?nitroGLYCERIN (NITROSTAT) 0.4 MG SL tablet Place 1 tablet (0.4 mg total) under the tongue every 5 (five) minutes as needed for chest pain. 12/15/19 02/27/22  Larey Dresser, MD  ?pantoprazole (PROTONIX) 40 MG tablet TAKE 1 TABLET BY MOUTH EVERY DAY 01/21/21   Clegg, Amy D, NP  ?predniSONE (DELTASONE) 5 MG tablet Take 1 tablet (5 mg total) by mouth daily with breakfast. 12/18/19   Darrick Grinder D, NP  ?rosuvastatin (CRESTOR) 40 MG tablet TAKE 1 TABLET BY MOUTH EVERY DAY 10/04/20   Larey Dresser, MD  ?simethicone (MYLICON) 914 MG chewable tablet Chew 125 mg by mouth every 6 (six) hours as needed for flatulence.    [provider]  ?tacrolimus (PROGRAF) 1 MG capsule Take 3 mg by mouth 2 (two) times daily.    [provider]  ?tiZANidine (ZANAFLEX) 4 MG tablet Take 1 tablet (4 mg total) by mouth 3 (three) times daily. 07/17/20   Sherrie George B, FNP  ?torsemide (DEMADEX) 20 MG tablet TAKE 1 TABLET BY MOUTH EVERY OTHER DAY 04/15/21   Larey Dresser, MD  ?warfarin (COUMADIN) 1 MG tablet TAKE 2 TO 3 TABLETS BY MOUTH AS DIRECTED BY ANTICOAGULATION CLINIC. 30 DAY SUPPLY PER MD 05/23/21   Constance Haw, MD  ? ? ?Physical Exam:  ? ?Vitals:  ? 06/04/21 1230 06/04/21 1349 06/04/21 1400 06/04/21 1430  ?BP: 121/82 130/85 (!) 138/97 135/84  ?Pulse: 69 65 69 65  ?Resp: (!) 26 20 (!) 24 20  ?Temp:      ?TempSrc:      ?SpO2: 95% 95% 96% 94%  ?Weight:      ?Height:       ? ? ? ?Physical Exam: ?Blood pressure 135/84, pulse 65, temperature 98.9 ?F (37.2 ?C), temperature source Oral, resp. rate 20, height 6\' 1"  (1.854 m), weight 88.5 kg, SpO2 94 %. ?Gen: Minimally diaphoretic man sitting up in bed with oxygen via nasal cannula in place and an ARD, able to speak in full sentences with attentive spouse at bedside. ?Eyes: sclera anicteric, conjuctiva mildly injected bilaterally ?  CVS: S1-S2, regulary, no gallops ?Respiratory: Patient does have coarse breath sounds with some mild expiratory wheezing bilateral mid lung fields, possible rales. ?GI: NABS, soft, NT  ?LE: No edema. No cyanosis ?Neuro: A/O x 3, Moving all extremities equally with normal strength, CN 3-12 intact, grossly nonfocal.  ?Psych: patient is logical and coherent, judgement and insight appear normal, mood and affect appropriate to situation. ? ? ?Data Review:  ? ? ?Labs: ?Basic Metabolic Panel: ?Recent Labs  ?Lab 06/04/21 ?1140  ?NA 139  ?K 4.1  ?CL 107  ?CO2 23  ?GLUCOSE 298*  ?BUN 34*  ?CREATININE 3.11*  ?CALCIUM 9.5  ? ?Liver Function Tests: ?Recent Labs  ?Lab 06/04/21 ?1140  ?AST 30  ?ALT 62*  ?ALKPHOS 70  ?BILITOT 0.9  ?PROT 6.4*  ?ALBUMIN 3.1*  ? ?No results for input(s): LIPASE, AMYLASE in the last 168 hours. ?No results for input(s): AMMONIA in the last 168 hours. ?CBC: ?Recent Labs  ?Lab 06/04/21 ?1140  ?WBC 8.1  ?HGB 12.7*  ?HCT 40.7  ?MCV 85.7  ?PLT 155  ? ?Cardiac Enzymes: ?No results for input(s): CKTOTAL, CKMB, CKMBINDEX, TROPONINI in the last 168 hours. ? ?BNP (last 3 results) ?No results for input(s): PROBNP in the last 8760 hours. ?CBG: ?No results for input(s): GLUCAP in the last 168 hours. ? ?Urinalysis ?   ?Component Value Date/Time  ? COLORURINE YELLOW 12/09/2019 0923  ? APPEARANCEUR CLOUDY (A) 12/09/2019 1062  ? LABSPEC 1.017 12/09/2019 0923  ? PHURINE 5.0 12/09/2019 0923  ? GLUCOSEU NEGATIVE 12/09/2019 0923  ? HGBUR MODERATE (A) 12/09/2019 0923  ? [place] NEGATIVE 12/09/2019 0923  ? [place]  NEGATIVE 12/09/2019 0923  ? PROTEINUR 100 (A) 12/09/2019 6948  ? NITRITE NEGATIVE 12/09/2019 0923  ? LEUKOCYTESUR NEGATIVE 12/09/2019 5462  ? ? ? ? ?Radiographic Studies: ?DG Chest 2 View ? ?Result Date: 06/04/2021

## 2021-06-04 NOTE — ED Notes (Signed)
Per MD, second lactic is not needed ? ?

## 2021-06-05 DIAGNOSIS — J189 Pneumonia, unspecified organism: Secondary | ICD-10-CM | POA: Diagnosis not present

## 2021-06-05 LAB — CBC
HCT: 40.6 % (ref 39.0–52.0)
Hemoglobin: 12.7 g/dL — ABNORMAL LOW (ref 13.0–17.0)
MCH: 26.8 pg (ref 26.0–34.0)
MCHC: 31.3 g/dL (ref 30.0–36.0)
MCV: 85.8 fL (ref 80.0–100.0)
Platelets: 152 10*3/uL (ref 150–400)
RBC: 4.73 MIL/uL (ref 4.22–5.81)
RDW: 13.9 % (ref 11.5–15.5)
WBC: 6.7 10*3/uL (ref 4.0–10.5)
nRBC: 0 % (ref 0.0–0.2)

## 2021-06-05 LAB — BASIC METABOLIC PANEL
Anion gap: 7 (ref 5–15)
BUN: 36 mg/dL — ABNORMAL HIGH (ref 6–20)
CO2: 23 mmol/L (ref 22–32)
Calcium: 9.3 mg/dL (ref 8.9–10.3)
Chloride: 111 mmol/L (ref 98–111)
Creatinine, Ser: 3.08 mg/dL — ABNORMAL HIGH (ref 0.61–1.24)
GFR, Estimated: 22 mL/min — ABNORMAL LOW (ref >60)
Glucose, Bld: 130 mg/dL — ABNORMAL HIGH (ref 70–99)
Potassium: 4.2 mmol/L (ref 3.5–5.1)
Sodium: 141 mmol/L (ref 135–145)

## 2021-06-05 LAB — GLUCOSE, CAPILLARY
Glucose-Capillary: 137 mg/dL — ABNORMAL HIGH (ref 70–99)
Glucose-Capillary: 213 mg/dL — ABNORMAL HIGH (ref 70–99)
Glucose-Capillary: 364 mg/dL — ABNORMAL HIGH (ref 70–99)
Glucose-Capillary: 243 mg/dL — ABNORMAL HIGH (ref 70–99)

## 2021-06-05 LAB — PROTIME-INR
INR: 1.7 — ABNORMAL HIGH (ref 0.8–1.2)
Prothrombin Time: 19.4 seconds — ABNORMAL HIGH (ref 11.4–15.2)

## 2021-06-05 MED ORDER — LIVING WELL WITH DIABETES BOOK
Freq: Once | Status: AC
  Filled 2021-06-05: qty 1

## 2021-06-05 MED ORDER — WARFARIN SODIUM 4 MG PO TABS
4.0000 mg | ORAL_TABLET | Freq: Once | ORAL | Status: AC
  Administered 2021-06-05: 4 mg via ORAL
  Filled 2021-06-05: qty 1

## 2021-06-05 NOTE — Progress Notes (Signed)
Mobility Specialist - Progress Note ? ? 06/05/21 1100  ?Mobility  ?Activity Ambulated independently in hallway  ?Level of Assistance Independent  ?Assistive Device None  ?Distance Ambulated (ft) 320 ft  ?Activity Response Tolerated well  ?$Mobility charge 1 Mobility  ? ? ? ?Pre-mobility: 75 HR, 90% SpO2 ?During mobility: 89 HR, 90-91% SpO2 ?Post-mobility: 80 HR, 90% SpO2 ? ? ?Pt lying in bed upon arrival, utilizing 1L. Pt ambulated in hallway independently, denying SOB as pt is weaned to RA for remainder of session. O2 maintained low 90s. No complaints other than headache. Pt returned to bed with needs in reach, family at bedside. RN notified, pt left on RA. ? ? ?Kathee Delton ?Mobility Specialist ?06/05/21, 11:52 AM ? ?

## 2021-06-05 NOTE — TOC Initial Note (Signed)
Transition of Care (TOC) - Initial/Assessment Note  ? ? ?Patient Details  ?Name: Casey [name]. ?MRN: [id number] ?Date of Birth: 1960-11-15 ? ?Transition of Care (TOC) CM/SW Contact:    ?Beverly Sessions, RN ?Phone Number: ?06/05/2021, 12:25 PM ? ?Clinical Narrative:                 ? ? ?Transition of Care (TOC) Screening Note ? ? ?Patient Details  ?Name: Casey [name]. ?Date of Birth: Nov 08, 1960 ? ? ?Transition of Care (TOC) CM/SW Contact:    ?Beverly Sessions, RN ?Phone Number: ?06/05/2021, 12:25 PM ? ? ? ?Transition of Care Department [facility]) has reviewed patient and no TOC needs have been identified at this time. We will continue to monitor patient advancement through interdisciplinary progression rounds. If new patient transition needs arise, please place a TOC consult. ? ?Patient currently on acute o2.  Anticipated to be weaned prior to dc  ?  ?  ? ? ?Patient Goals and CMS Choice ?  ?  ?  ? ?Expected Discharge Plan and Services ?  ?  ?  ?  ?  ?                ?  ?  ?  ?  ?  ?  ?  ?  ?  ?  ? ?Prior Living Arrangements/Services ?  ?  ?  ?       ?  ?  ?  ?  ? ?Activities of Daily Living ?Home Assistive Devices/Equipment: None ?ADL Screening (condition at time of admission) ?Patient's cognitive ability adequate to safely complete daily activities?: Yes ?Is the patient deaf or have difficulty hearing?: No ?Does the patient have difficulty seeing, even when wearing glasses/contacts?: No ?Does the patient have difficulty concentrating, remembering, or making decisions?: No ?Patient able to express need for assistance with ADLs?: Yes ?Does the patient have difficulty dressing or bathing?: No ?Independently performs ADLs?: Yes (appropriate for developmental age) ?Does the patient have difficulty walking or climbing stairs?: No ?Weakness of Legs: None ?Weakness of Arms/Hands: None ? ?Permission Sought/Granted ?  ?  ?   ?   ?   ?   ? ?Emotional Assessment ?  ?  ?  ?  ?  ?  ? ?Admission diagnosis:  Hyperglycemia  [R73.9] ?CAP (community acquired pneumonia) [J18.9] ?Acute respiratory failure with hypoxia ([place]) [J96.01] ?Troponin I above reference range [R77.8] ?Community acquired pneumonia, unspecified laterality [J18.9] ?Patient Active Problem List  ? Diagnosis Date Noted  ? CAP (community acquired pneumonia) 06/04/2021  ? Long term (current) use of anticoagulants 12/19/2019  ? LV (left ventricular) mural thrombus following MI ([place]) 12/19/2019  ? Afib ([place]) 12/19/2019  ? Cardiogenic shock ([place]) 12/05/2019  ? Acute kidney injury superimposed on chronic kidney disease ([place])   ? HTN (hypertension) 12/04/2019  ? Acute systolic heart failure ([place])   ? Acute ST elevation myocardial infarction (STEMI) due to occlusion of left anterior descending (LAD) coronary artery ([place]) 12/03/2019  ? Type 2 diabetes mellitus with hyperlipidemia ([place]) 12/03/2019  ? Hyperlipidemia 12/03/2019  ? History of renal transplant 12/03/2019  ? History of ST elevation myocardial infarction (STEMI) 12/03/2019  ? End stage renal disease ([place]) 03/03/2011  ? Aneurysm of unspecified site [facility]) 03/03/2011  ? Other complications due to renal dialysis device, implant, and graft 02/13/2011  ? ESRD (end stage renal disease) ([place]) 02/13/2011  ? ?PCP:  Dion Body, MD ?Pharmacy:   ?[id number] - [place], [place] -  [address] ?[address] ?[place] [address] ?Phone: [phone] Fax: [phone] ? ? ? ? ?Social Determinants of Health (SDOH) Interventions ?  ? ?Readmission Risk Interventions ?   ? View : No data to display.  ?  ?  ?  ? ? ? ?

## 2021-06-05 NOTE — Progress Notes (Signed)
Inpatient Diabetes Program Recommendations ? ?AACE/ADA: New Consensus Statement on Inpatient Glycemic Control (2015) ? ?Target Ranges:  Prepandial:   less than 140 mg/dL ?     Peak postprandial:   less than 180 mg/dL (1-2 hours) ?     Critically ill patients:  140 - 180 mg/dL  ? ?Lab Results  ?Component Value Date  ? GLUCAP 137 (H) 06/05/2021  ? HGBA1C 9.1 (H) 06/04/2021  ? ? ?Review of Glycemic Control ? Latest Reference Range & Units 06/04/21 16:47 06/04/21 18:23 06/04/21 21:01 06/05/21 07:47  ?Glucose-Capillary 70 - 99 mg/dL 261 (H) 322 (H) 182 (H) 137 (H)  ?(H): Data is abnormally high ? ?Diabetes history: DM2 ?Outpatient Diabetes medications: Glucotrol 10 mg bid ?Current orders for Inpatient glycemic control: Novolog 0-9 units tid, Prednisone 5 mg qd ? ?Inpatient Diabetes Program Recommendations:   ?Spoke with pt about A1C 9.1 (average blood glucose 214 over the past 2-3 months) and explained what an A1C is, basic pathophysiology of DM Type 2, basic home care, basic diabetes diet nutrition principles, importance of checking CBGs and maintaining good CBG control to prevent long-term and short-term complications. Reviewed signs and symptoms of hyperglycemia and hypoglycemia and how to treat hypoglycemia at home. Also reviewed blood sugar goals at home.  ?RNs to provide ongoing basic DM education at bedside with this patient. Have ordered educational booklet Living Well With diabetes. ? ?Discussed nutrition basics with patient. Patient has been drinking approximately 16 oz. Orange and cranberry juice every day and eating more sandwiches. Reviewed with patient information regarding limiting carbohydrates, juice and sugary drinks, and exercising regularly. Patient shared that he walks about 1 hr. Every day and commended patient on activity. Patient to review booklet sent to patient. ? ?Thank you, ?Nani Gasser Blessings Inglett, RN, MSN, CDE  ?Diabetes Coordinator ?Inpatient Glycemic Control Team ?Team Pager [phone]  (8am-5pm) ?06/05/2021 9:05 AM ? ? ? ? ?

## 2021-06-05 NOTE — Consult Note (Signed)
ANTICOAGULATION CONSULT NOTE - Initial Consult ? ?Pharmacy Consult for warfarin ?Indication: atrial fibrillation ? ?No Known Allergies ? ?Patient Measurements: ?Height: 6\' 1"  (185.4 cm) ?Weight: 88.5 kg (195 lb 1.7 oz) ?IBW/kg (Calculated) : 79.9 ? ? ?Vital Signs: ?Temp: 98.7 ?F (37.1 ?C) (04/20 0746) ?Temp Source: Oral (04/20 0746) ?BP: 137/90 (04/20 0746) ?Pulse Rate: 73 (04/20 0746) ? ?Labs: ?Recent Labs  ?  06/04/21 ?1140 06/04/21 ?1403 06/04/21 ?1630 06/05/21 ?5732  ?HGB 12.7*  --  12.9* 12.7*  ?HCT 40.7  --  41.6 40.6  ?PLT 155  --  149* 152  ?APTT 41*  --   --   --   ?LABPROT 16.7*  --   --  19.4*  ?INR 1.4*  --   --  1.7*  ?CREATININE 3.11*  --  3.27* 3.08*  ?TROPONINIHS 30* 32*  --   --   ? ? ? ?Estimated Creatinine Clearance: 28.8 mL/min (A) (by C-G formula based on SCr of 3.08 mg/dL (H)). ? ? ?Medical History: ?Past Medical History:  ?Diagnosis Date  ? CHF (congestive heart failure) ([place])   ? Chronic kidney disease 04/2009  ? Kidney Transplant  ? Diabetes mellitus   ? GERD (gastroesophageal reflux disease)   ? as needed reflux  ? Heart attack ([place]) 02/02/2020  ? Hypertension   ? 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  ? ? ?Medications:  ?Warfarin PTA Regimen: ?3mg  Mon, Fri; 2mg  rest of the week (per anticoag visit summary on 05/21/21) ?Total weekly dose 16mg  ? ?Assessment: ? 61 y.o. male s/p renal transplant 2011, PAF on warfarin chronic therapy with regimen as above presented to [facility] 06/04/21 with cough and chest pain. Pharmacy consulted to manage warfarin therapy while in patient. INR noted to be subtherapeutic at 1.4 (Goal 2-3) on admission.  ? ?Date INR Warfarin Dose  ?4/18 -- 3 mg  ?4/19 1.4 5 mg  ?4/20 1.7 5 mg ? ?DDIs: ?Amiodarone 100 mg daily -- PTA/chronic med ?4/19 Ceftriaxone 1 gram + Azithromycin 500 mg daily >  ? ?Goal of Therapy:  ?INR 2-3 ?Monitor platelets by anticoagulation protocol: Yes ?  ?Plan:  ?INR remains subtherapeutic D2 of admission. ?Will order 4mg  x1  tonight then can likely resume home regimen once INR at goal. ?Continue to monitor INR daily  ? ?Darrick Penna, PharmD, MS PGPM ?Clinical Pharmacist ?06/05/2021 ?11:36 AM ? ? ? ?

## 2021-06-05 NOTE — Progress Notes (Signed)
?PROGRESS NOTE ? ? ? ?Casey Lieu Bringle Jr.  MBW:[id number] DOB: 07-May-1960 DOA: 06/04/2021 ?PCP: Dion Body, MD  ? ? ?Brief Narrative:  ?61 y.o. male s/p renal transplant 2011, CKD 3, DM 2, CAD and HFrEF, PAF was in his USOH until 3 days ago when he developed a cough productive of scant phlegm.  Patient subsequently developed some dyspnea on exertion and malaise.  No fevers or chills.  Patient does admit to some intermittent right-sided chest pain that is not exertional.  Patient denies orthopnea or PND. ?  ?Patient was worked up in the ED and found to be afebrile and normotensive.  He was noted to have a new oxygen requirement with O2 sats 86 to 88% on room air, patient does not have a history of COPD or any oxygen requirement.  Chest x-ray was negative although chest CT without contrast did show mild bilateral haziness which may be early pneumonia.  Patient denies tobacco use.  Patient was treated with ceftriaxone azithromycin and called in for admission. ? ?Improving over interval.  Remains on 3 L nasal cannula as of 4/20.  Afebrile over interval. ? ? ?Assessment & Plan: ?  ?Principal Problem: ?  CAP (community acquired pneumonia) ? ?Community-acquired pneumonia in immunocompromised patient ?Hemodynamically stable and afebrile over interval ?Influenza COVID-negative ?Plan: ?Continue Rocephin and azithromycin ?Oxygen as needed, wean as tolerated ?Monitor vitals and fever curve ?Incentive spirometry ?Tentative plan discharge home 4/21 ? ?Abnormal EKG ?Elevated troponins ?History of coronary artery disease ?Atypical, nonspecific chest pain on admission ?T wave inversions noted inferolaterally ?Troponin elevation to 30, likely demand ischemia ?Low suspicion for ACS ?Patient states that he was taken off aspirin by his cardiologist.  Does not take Plavix ?Plan: ?Continue BiDil, Crestor, Zetia ?EKG as needed chest pain ? ?Status post renal transplant ?CKD stage IV ?Creatinine appears at or near baseline ?Continue  prednisone and tacrolimus ?Outpatient nephrology follow-up ? ?Heart failure with preserved ejection fraction ?Appears euvolemic ?Continue carvedilol, BiDil, torsemide ? ?Type 2 diabetes mellitus with hyperglycemia ?Home glipizide held ?Continue with SSI before meals and at bedtime ?Consider addition of long-acting insulin should sugars remain elevated ?Modified diet ? ?Paroxysmal atrial fibrillation ?Rate controlled ?Continue amiodarone ?Warfarin per pharmacy ? ? ?DVT prophylaxis: Warfarin ?Code Status: Full ?Family Communication: None today.  Plan discussed at length with patient ?Disposition Plan: Status is: Inpatient ?Remains inpatient appropriate because: \Community-acquired pneumonia, immunocompromise state, tentative plan discharge home 4/21 ? ? ?Level of care: Med-Surg ? ?Consultants:  ?None ? ?Procedures:  ?None ? ?Antimicrobials: ?Rocephin ?Azithromycin ? ? ?Subjective: ?Seen and examined.  Resting in bed.  Some cough.  No pain complaints. ? ?Objective: ?Vitals:  ? 06/04/21 1609 06/04/21 1924 06/05/21 0437 06/05/21 0746  ?BP: (!) 163/91 130/83 125/82 137/90  ?Pulse: 67 67 71 73  ?Resp: 18 18 20 18   ?Temp: 98.2 ?F (36.8 ?C) 98 ?F (36.7 ?C) 98.2 ?F (36.8 ?C) 98.7 ?F (37.1 ?C)  ?TempSrc: Oral Oral Oral Oral  ?SpO2: 100% 95% 96% 96%  ?Weight:      ?Height:      ? ? ?Intake/Output Summary (Last 24 hours) at 06/05/2021 1030 ?Last data filed at 06/05/2021 0500 ?Gross per 24 hour  ?Intake --  ?Output 625 ml  ?Net -625 ml  ? ?Filed Weights  ? 06/04/21 1123  ?Weight: 88.5 kg  ? ? ?Examination: ? ?General exam: No acute distress ?Respiratory system: Bibasilar crackles.  Normal work of breathing.  3 L ?Cardiovascular system: S1-S2, RRR, no murmurs, no pedal edema ?  Gastrointestinal system: Soft, NT/ND, normal bowel sounds ?Central nervous system: Alert and oriented. No focal neurological deficits. ?Extremities: Symmetric 5 x 5 power. ?Skin: No rashes, lesions or ulcers ?Psychiatry: Judgement and insight appear normal.  Mood & affect appropriate.  ? ? ? ?Data Reviewed: I have personally reviewed following labs and imaging studies ? ?CBC: ?Recent Labs  ?Lab 06/04/21 ?1140 06/04/21 ?1630 06/05/21 ?8315  ?WBC 8.1 6.0 6.7  ?HGB 12.7* 12.9* 12.7*  ?HCT 40.7 41.6 40.6  ?MCV 85.7 86.0 85.8  ?PLT 155 149* 152  ? ?Basic Metabolic Panel: ?Recent Labs  ?Lab 06/04/21 ?1140 06/04/21 ?1630 06/05/21 ?1761  ?NA 139  --  141  ?K 4.1  --  4.2  ?CL 107  --  111  ?CO2 23  --  23  ?GLUCOSE 298*  --  130*  ?BUN 34*  --  36*  ?CREATININE 3.11* 3.27* 3.08*  ?CALCIUM 9.5  --  9.3  ? ?GFR: ?Estimated Creatinine Clearance: 28.8 mL/min (A) (by C-G formula based on SCr of 3.08 mg/dL (H)). ?Liver Function Tests: ?Recent Labs  ?Lab 06/04/21 ?1140  ?AST 30  ?ALT 62*  ?ALKPHOS 70  ?BILITOT 0.9  ?PROT 6.4*  ?ALBUMIN 3.1*  ? ?No results for input(s): LIPASE, AMYLASE in the last 168 hours. ?No results for input(s): AMMONIA in the last 168 hours. ?Coagulation Profile: ?Recent Labs  ?Lab 06/04/21 ?1140 06/05/21 ?6073  ?INR 1.4* 1.7*  ? ?Cardiac Enzymes: ?No results for input(s): CKTOTAL, CKMB, CKMBINDEX, TROPONINI in the last 168 hours. ?BNP (last 3 results) ?No results for input(s): PROBNP in the last 8760 hours. ?HbA1C: ?Recent Labs  ?  06/04/21 ?1630  ?HGBA1C 9.1*  ? ?CBG: ?Recent Labs  ?Lab 06/04/21 ?1647 06/04/21 ?1823 06/04/21 ?2101 06/05/21 ?7106  ?GLUCAP 261* 322* 182* 137*  ? ?Lipid Profile: ?No results for input(s): CHOL, HDL, LDLCALC, TRIG, CHOLHDL, LDLDIRECT in the last 72 hours. ?Thyroid Function Tests: ?No results for input(s): TSH, T4TOTAL, FREET4, T3FREE, THYROIDAB in the last 72 hours. ?Anemia Panel: ?No results for input(s): VITAMINB12, FOLATE, FERRITIN, TIBC, IRON, RETICCTPCT in the last 72 hours. ?Sepsis Labs: ?Recent Labs  ?Lab 06/04/21 ?1217 06/04/21 ?1630  ?LATICACIDVEN 0.8 0.9  ? ? ?Recent Results (from the past 240 hour(s))  ?Blood Culture (routine x 2)     Status: None (Preliminary result)  ? Collection Time: 06/04/21 12:17 PM  ? Specimen:  BLOOD  ?Result Value Ref Range Status  ? Specimen Description BLOOD BLOOD LEFT HAND  Final  ? Special Requests   Final  ?  BOTTLES DRAWN AEROBIC AND ANAEROBIC Blood Culture results may not be optimal due to an inadequate volume of blood received in culture bottles  ? Culture   Final  ?  NO GROWTH < 24 HOURS ?Performed at [facility], [address], [place], [place] [address] ?  ? Report Status PENDING  Incomplete  ?Blood Culture (routine x 2)     Status: None (Preliminary result)  ? Collection Time: 06/04/21 12:17 PM  ? Specimen: BLOOD  ?Result Value Ref Range Status  ? Specimen Description BLOOD RIGHT ANTECUBITAL  Final  ? Special Requests   Final  ?  BOTTLES DRAWN AEROBIC AND ANAEROBIC Blood Culture results may not be optimal due to an inadequate volume of blood received in culture bottles  ? Culture   Final  ?  NO GROWTH < 24 HOURS ?Performed at [facility], [address] [place], [place] [address] ?  ? Report Status PENDING  Incomplete  ?  Resp Panel by RT-PCR (Flu A&B, Covid) Nasopharyngeal Swab     Status: None  ? Collection Time: 06/04/21 12:17 PM  ? Specimen: Nasopharyngeal Swab; Nasopharyngeal(NP) swabs in vial transport medium  ?Result Value Ref Range Status  ? SARS Coronavirus 2 by RT PCR NEGATIVE NEGATIVE Final  ?  Comment: (NOTE) ?SARS-CoV-2 target nucleic acids are NOT DETECTED. ? ?The SARS-CoV-2 RNA is generally detectable in upper respiratory ?specimens during the acute phase of infection. The lowest ?concentration of SARS-CoV-2 viral copies this assay can detect is ?138 copies/mL. A negative result does not preclude SARS-Cov-2 ?infection and should not be used as the sole basis for treatment or ?other patient management decisions. A negative result may occur with  ?improper specimen collection/handling, submission of specimen other ?than nasopharyngeal swab, presence of viral mutation(s) within the ?areas targeted by this assay, and inadequate number of viral ?copies(<138  copies/mL). A negative result must be combined with ?clinical observations, patient history, and epidemiological ?information. The expected result is Negative. ? ?Fact Sheet for Patients:  ?http://garza.org/

## 2021-06-06 DIAGNOSIS — J189 Pneumonia, unspecified organism: Secondary | ICD-10-CM | POA: Diagnosis not present

## 2021-06-06 LAB — BASIC METABOLIC PANEL
Anion gap: 11 (ref 5–15)
Anion gap: 9 (ref 5–15)
BUN: 45 mg/dL — ABNORMAL HIGH (ref 6–20)
BUN: 50 mg/dL — ABNORMAL HIGH (ref 6–20)
CO2: 23 mmol/L (ref 22–32)
CO2: 22 mmol/L (ref 22–32)
Calcium: 9.7 mg/dL (ref 8.9–10.3)
Calcium: 9.5 mg/dL (ref 8.9–10.3)
Chloride: 108 mmol/L (ref 98–111)
Chloride: 106 mmol/L (ref 98–111)
Creatinine, Ser: 3.61 mg/dL — ABNORMAL HIGH (ref 0.61–1.24)
Creatinine, Ser: 4.04 mg/dL — ABNORMAL HIGH (ref 0.61–1.24)
GFR, Estimated: 18 mL/min — ABNORMAL LOW (ref >60)
GFR, Estimated: 16 mL/min — ABNORMAL LOW (ref >60)
Glucose, Bld: 137 mg/dL — ABNORMAL HIGH (ref 70–99)
Glucose, Bld: 325 mg/dL — ABNORMAL HIGH (ref 70–99)
Potassium: 3.9 mmol/L (ref 3.5–5.1)
Potassium: 4.2 mmol/L (ref 3.5–5.1)
Sodium: 142 mmol/L (ref 135–145)
Sodium: 137 mmol/L (ref 135–145)

## 2021-06-06 LAB — RENAL FUNCTION PANEL
Albumin: 3 g/dL — ABNORMAL LOW (ref 3.5–5.0)
Anion gap: 10 (ref 5–15)
BUN: 44 mg/dL — ABNORMAL HIGH (ref 6–20)
CO2: 24 mmol/L (ref 22–32)
Calcium: 9.6 mg/dL (ref 8.9–10.3)
Chloride: 107 mmol/L (ref 98–111)
Creatinine, Ser: 3.56 mg/dL — ABNORMAL HIGH (ref 0.61–1.24)
GFR, Estimated: 19 mL/min — ABNORMAL LOW (ref >60)
Glucose, Bld: 136 mg/dL — ABNORMAL HIGH (ref 70–99)
Phosphorus: 2.9 mg/dL (ref 2.5–4.6)
Potassium: 3.9 mmol/L (ref 3.5–5.1)
Sodium: 141 mmol/L (ref 135–145)

## 2021-06-06 LAB — GLUCOSE, CAPILLARY
Glucose-Capillary: 134 mg/dL — ABNORMAL HIGH (ref 70–99)
Glucose-Capillary: 231 mg/dL — ABNORMAL HIGH (ref 70–99)

## 2021-06-06 LAB — TACROLIMUS LEVEL: Tacrolimus (FK506) - LabCorp: 16.1 ng/mL (ref 2.0–20.0)

## 2021-06-06 LAB — PROTIME-INR
INR: 2.3 — ABNORMAL HIGH (ref 0.8–1.2)
Prothrombin Time: 25.4 seconds — ABNORMAL HIGH (ref 11.4–15.2)

## 2021-06-06 MED ORDER — WARFARIN SODIUM 2 MG PO TABS
2.0000 mg | ORAL_TABLET | ORAL | Status: DC
Start: 2021-06-07 — End: 2021-06-06

## 2021-06-06 MED ORDER — WARFARIN SODIUM 3 MG PO TABS
3.0000 mg | ORAL_TABLET | ORAL | Status: DC
  Filled 2021-06-06: qty 1

## 2021-06-06 MED ORDER — BENZONATATE 100 MG PO CAPS
200.0000 mg | ORAL_CAPSULE | Freq: Three times a day (TID) | ORAL | Status: DC
  Administered 2021-06-06: 200 mg via ORAL
  Filled 2021-06-06: qty 2

## 2021-06-06 MED ORDER — AZITHROMYCIN 500 MG PO TABS: 500.0000 mg | ORAL_TABLET | Freq: Every day | ORAL | 0 refills | Status: AC

## 2021-06-06 MED ORDER — SODIUM CHLORIDE 0.9 % IV SOLN
1.0000 g | INTRAVENOUS | Status: AC
  Administered 2021-06-06: 1 g via INTRAVENOUS
  Filled 2021-06-06: qty 1

## 2021-06-06 MED ORDER — AZITHROMYCIN 250 MG PO TABS
500.0000 mg | ORAL_TABLET | Freq: Every day | ORAL | Status: DC
  Administered 2021-06-06: 500 mg via ORAL
  Filled 2021-06-06: qty 2

## 2021-06-06 MED ORDER — BENZONATATE 200 MG PO CAPS: 200.0000 mg | ORAL_CAPSULE | Freq: Three times a day (TID) | ORAL | 0 refills | Status: DC

## 2021-06-06 MED ORDER — AMOXICILLIN-POT CLAVULANATE 500-125 MG PO TABS: 1.0000 | ORAL_TABLET | Freq: Two times a day (BID) | ORAL | 0 refills | Status: AC

## 2021-06-06 MED ORDER — GUAIFENESIN 100 MG/5ML PO LIQD
5.0000 mL | ORAL | Status: DC | PRN
  Administered 2021-06-06: 5 mL via ORAL
  Filled 2021-06-06: qty 10

## 2021-06-06 NOTE — Consult Note (Signed)
?[facility]  ?CONSULT NOTE  ? ? ?Date: 06/06/2021      ?      ?      ?Patient Name:  Casey [name].  MRN: [id number]  ?DOB: Oct 18, 1960  Age / Sex: 61 y.o., male   ?      ?PCP: Dion Body, MD     ?      ?      ?Service Requesting Consult: TRH     ?      ?      ?Reason for Consult: Acute kidney injury     ?      ? ?History of Present Illness: ?Mr. Mandeep Kiser. is a 61 y.o.  male with past medical conditions including diabetes type 2, CAD, heart failure, paroxysmal atrial fibrillation, and chronic kidney disease stage IIIb, who was admitted to [facility] on 06/04/2021 for Hyperglycemia [R73.9] ?CAP (community acquired pneumonia) [J18.9] ?Acute respiratory failure with hypoxia ([place]) [J96.01] ?Troponin I above reference range [R77.8] ?Community acquired pneumonia, unspecified laterality [J18.9] ? ?Patient is currently followed by [place] [facility] specialist in [place], Dr. Marval Regal.  Kidney transplant recipient in 2011.  Received his kidney from a cadaver donor at [facility].  Has maintained all medications and appointments related to this.  Patient states he had MI in 2020 and and lost a little renal function due to this.  Chart review indicates baseline creatinine between 3.1 and 3.3.  Patient states recent blood work, creatinine 2.85.  Creatinine currently 3.61 today.  Currently being treated for pneumonia. ? ? ?Medications: ?Outpatient medications: ?Medications Prior to Admission  ?Medication Sig Dispense Refill Last Dose  ? amiodarone (PACERONE) 200 MG tablet TAKE 1 TABLET BY MOUTH EVERY DAY 90 tablet 2 06/04/2021  ? BIDIL 20-37.5 MG tablet TAKE 2 TABLETS BY MOUTH 3 TIMES DAILY. 540 tablet 2 06/04/2021  ? carvedilol (COREG) 25 MG tablet TAKE 1 TABLET BY MOUTH TWICE A DAY 180 tablet 3 06/04/2021  ? ezetimibe (ZETIA) 10 MG tablet Take 1 tablet (10 mg total) by mouth daily. 30 tablet 11 06/04/2021  ? glipiZIDE (GLUCOTROL) 10 MG tablet Take 10 mg by mouth 2 (two) times daily before  a meal.   06/04/2021  ? nitroGLYCERIN (NITROSTAT) 0.4 MG SL tablet Place 1 tablet (0.4 mg total) under the tongue every 5 (five) minutes as needed for chest pain. 100 tablet 3 prn  ? predniSONE (DELTASONE) 5 MG tablet Take 1 tablet (5 mg total) by mouth daily with breakfast.   06/04/2021  ? rosuvastatin (CRESTOR) 40 MG tablet TAKE 1 TABLET BY MOUTH EVERY DAY 90 tablet 3 06/04/2021  ? tacrolimus (PROGRAF) 1 MG capsule Take 3 mg by mouth 2 (two) times daily.   06/04/2021  ? warfarin (COUMADIN) 1 MG tablet TAKE 2 TO 3 TABLETS BY MOUTH AS DIRECTED BY ANTICOAGULATION CLINIC. 30 DAY SUPPLY PER MD (Patient taking differently: Take 1 mg by mouth daily. Take 3 mg on Monday and Friday, take 2 mg all other days.) 270 tablet 1 06/03/2021 at 1600  ? fluorouracil (EFUDEX) 5 % cream Apply 1 application. topically as directed.     ? loratadine (CLARITIN) 10 MG tablet Take 10 mg by mouth daily as needed for allergies.   prn  ? pantoprazole (PROTONIX) 40 MG tablet TAKE 1 TABLET BY MOUTH EVERY DAY (Patient not taking: Reported on 06/04/2021) 30 tablet 0 Not Taking  ? simethicone (MYLICON) 509 MG chewable tablet Chew 125 mg by mouth every 6 (  six) hours as needed for flatulence.   prn  ? tiZANidine (ZANAFLEX) 4 MG tablet Take 1 tablet (4 mg total) by mouth 3 (three) times daily. (Patient not taking: Reported on 06/04/2021) 30 tablet 0 Not Taking  ? torsemide (DEMADEX) 20 MG tablet TAKE 1 TABLET BY MOUTH EVERY OTHER DAY 45 tablet 2 prn  ? ? ?Current medications: ?Current Facility-Administered Medications  ?Medication Dose Route Frequency Provider Last Rate Last Admin  ? 0.9 %  sodium chloride infusion  250 mL Intravenous PRN Vashti Hey, MD      ? acetaminophen (TYLENOL) tablet 650 mg  650 mg Oral Q6H PRN Vashti Hey, MD      ? Or  ? acetaminophen (TYLENOL) suppository 650 mg  650 mg Rectal Q6H PRN Vashti Hey, MD      ? amiodarone (PACERONE) tablet 100 mg  100 mg Oral Daily Bonnell Public Tublu,  MD   100 mg at 06/06/21 4158  ? azithromycin (ZITHROMAX) tablet 500 mg  500 mg Oral Daily Ralene Muskrat B, MD   500 mg at 06/06/21 1032  ? benzonatate (TESSALON) capsule 200 mg  200 mg Oral TID Ralene Muskrat B, MD   200 mg at 06/06/21 1036  ? carvedilol (COREG) tablet 25 mg  25 mg Oral BID Bonnell Public Tublu, MD   25 mg at 06/06/21 3094  ? ezetimibe (ZETIA) tablet 10 mg  10 mg Oral Daily Vashti Hey, MD   10 mg at 06/06/21 0768  ? guaiFENesin (ROBITUSSIN) 100 MG/5ML liquid 5 mL  5 mL Oral Q4H PRN Ralene Muskrat B, MD   5 mL at 06/06/21 1036  ? insulin aspart (novoLOG) injection 0-9 Units  0-9 Units Subcutaneous TID WC Vashti Hey, MD   3 Units at 06/06/21 1204  ? isosorbide-hydrALAZINE (BIDIL) 20-37.5 MG per tablet 2 tablet  2 tablet Oral TID Vashti Hey, MD   2 tablet at 06/06/21 0881  ? pneumococcal 20-valent conjugate vaccine (PREVNAR 20) injection 0.5 mL  0.5 mL Intramuscular Prior to discharge Vashti Hey, MD      ? polyethylene glycol (MIRALAX / GLYCOLAX) packet 17 g  17 g Oral Daily PRN Bonnell Public Tublu, MD      ? predniSONE (DELTASONE) tablet 5 mg  5 mg Oral Q breakfast Bonnell Public Tublu, MD   5 mg at 06/06/21 1031  ? rosuvastatin (CRESTOR) tablet 10 mg  10 mg Oral Daily Bonnell Public Tublu, MD   10 mg at 06/06/21 0810  ? sodium chloride flush (NS) 0.9 % injection 3 mL  3 mL Intravenous Q12H Bonnell Public Tublu, MD   3 mL at 06/06/21 1032  ? sodium chloride flush (NS) 0.9 % injection 3 mL  3 mL Intravenous PRN Bonnell Public Tublu, MD      ? tacrolimus (PROGRAF) capsule 3 mg  3 mg Oral BID Bonnell Public Tublu, MD   3 mg at 06/06/21 0809  ? torsemide (DEMADEX) tablet 20 mg  20 mg Oral Kateri Mc Tublu, MD   20 mg at 06/06/21 5945  ? warfarin (COUMADIN) tablet 3 mg  3 mg Oral Once per day on Mon Fri Sreenath, Sudheer B, MD      ? And  ? [START ON 06/07/2021] warfarin (COUMADIN) tablet  2 mg  2 mg Oral Once per day on Sun Tue Wed Thu Sat Sidney Ace, MD      ? Warfarin - Pharmacist Dosing Inpatient   Does not  apply q1600 Dorothe Pea, [facility]   Given at 06/05/21 2039  ?  ? ? ?Allergies: ?No Known Allergies  ? ? ?Past Medical History: ?Past Medical History:  ?Diagnosis Date  ? CHF (congestive heart failure) ([place])   ? Chronic kidney disease 04/2009  ? Kidney Transplant  ? Diabetes mellitus   ? GERD (gastroesophageal reflux disease)   ? as needed reflux  ? Heart attack ([place]) 02/02/2020  ? Hypertension   ? 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  ? ? ? ?Past Surgical History: ?Past Surgical History:  ?Procedure Laterality Date  ? AV FISTULA PLACEMENT  03/09/2011  ? 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 CORONARY/GRAFT ACUTE MI REVASCULARIZATION N/A 12/03/2019  ? Procedure: Coronary/Graft Acute MI Revascularization;  Surgeon: Wellington Hampshire, MD;  Location: [place] [facility];  Service: Cardiovascular;  Laterality: N/A;  ? DIALYSIS FISTULA CREATION    ? last used 04/2009  ? INSERTION OF DIALYSIS CATHETER    ? cordis dialysis catheter placement  ? KIDNEY TRANSPLANT  2011  ? LEFT HEART CATH AND CORONARY ANGIOGRAPHY N/A 12/03/2019  ? Procedure: LEFT HEART CATH AND CORONARY ANGIOGRAPHY;  Surgeon: Wellington Hampshire, MD;  Location: [place] [facility];  Service: Cardiovascular;  Laterality: N/A;  ? RIGHT HEART CATH N/A 12/05/2019  ? Procedure: RIGHT HEART CATH;  Surgeon: Larey Dresser, MD;  Location: [place];  Service: Cardiovascular;  Laterality: N/A;  ? ? ? ?Family History: ?Family History  ?Problem Relation Age of Onset  ? Diabetes Mother   ? Hyperlipidemia Mother   ? Cancer Father   ? ? ? ?Social History: ?Social History  ? ?Socioeconomic History  ? Marital status: Married  ?  Spouse name: Not on file  ? Number of  children: Not on file  ? Years of education: Not on file  ? Highest education level: Not on file  ?Occupational History  ? Not on file  ?Tobacco Use  ? Smoking status: Never  ? Smokeless tobacco: Ne

## 2021-06-06 NOTE — Progress Notes (Addendum)
Inpatient Diabetes Program Recommendations ? ?AACE/ADA: New Consensus Statement on Inpatient Glycemic Control (2015) ? ?Target Ranges:  Prepandial:   less than 140 mg/dL ?     Peak postprandial:   less than 180 mg/dL (1-2 hours) ?     Critically ill patients:  140 - 180 mg/dL  ? ? Latest Reference Range & Units 06/05/21 07:47 06/05/21 11:44 06/05/21 17:14 06/05/21 21:08  ?Glucose-Capillary 70 - 99 mg/dL 137 (H) ? ?1 unit Novolog ? 213 (H) ? ?3 units Novolog ? 364 (H) ? ?9 units Novolog ? 243 (H)  ?(H): Data is abnormally high ? ? ? ? ?Home DM Meds: Glipizide 10 mg BID ? ?Current Orders: Novolog Sensitive Correction Scale/ SSI (0-9 units) TID AC ?     Prednisone 5 mg daily ? ? ? ?MD- Note afternoon CBGs elevated.  Home Glipizide is currently on hold. ? ?Please consider starting Novolog Meal Coverage inpatient: ? ?Novolog 4 units TID with meals ? ?HOLD if pt eats <50% meals ? ? ? ?--Will follow patient during hospitalization-- ? ?Wyn Quaker RN, MSN, CDE ?Diabetes Coordinator ?Inpatient Glycemic Control Team ?Team Pager: [phone] (8a-5p) ? ?

## 2021-06-06 NOTE — Progress Notes (Signed)
DISCHARGE NOTE ? ?Patient discharged home via private vehicle with wife. Discharge instructions reviewed with both the patient and the wife, with verbal understanding. IV removed without difficulty with no redness or swelling noted at the site.  ? ?

## 2021-06-06 NOTE — Progress Notes (Signed)
Mobility Specialist - Progress Note ? ? ? 06/06/21 1400  ?Mobility  ?Activity Ambulated independently in hallway;Stood at bedside;Dangled on edge of bed  ?Level of Assistance Independent  ?Assistive Device None  ?Distance Ambulated (ft) 320 ft  ?Activity Response Tolerated well  ?$Mobility charge 1 Mobility  ? ? ? ?Pre-mobility: 74 HR, 94% SpO2 ?During mobility: 80 HR,  93% SpO2 ? ?Pt supine upon arrival using RA. Pt completes bed mobility, STS and ambulates indep voicing no complaints. Pt returns to bed with needs in reach. ? ?Casey [name] ?Mobility Specialist ?06/06/21, 2:23 PM ? ? ? ? ?

## 2021-06-06 NOTE — Discharge Summary (Signed)
Physician Discharge Summary  ?Casey Lieu Osei Jr. WUG:[id number] DOB: 1960-06-14 DOA: 06/04/2021 ? ?PCP: Dion Body, MD ? ?Admit date: 06/04/2021 ?Discharge date: 06/06/2021 ? ?Admitted From: Home ?Disposition:  Home ? ?Recommendations for Outpatient Follow-up:  ?Follow up with PCP in 1-2 weeks ?Follow up with nephrology Monday 4/24 ? ?Home Health:No  ?Equipment/Devices:None  ? ?Discharge Condition:Stable  ?CODE STATUS:FULL  ?Diet recommendation: Reg ? ?Brief/Interim Summary: ?61 y.o. male s/p renal transplant 2011, CKD 3, DM 2, CAD and HFrEF, PAF was in his USOH until 3 days ago when he developed a cough productive of scant phlegm.  Patient subsequently developed some dyspnea on exertion and malaise.  No fevers or chills.  Patient does admit to some intermittent right-sided chest pain that is not exertional.  Patient denies orthopnea or PND. ?  ?Patient was worked up in the ED and found to be afebrile and normotensive.  He was noted to have a new oxygen requirement with O2 sats 86 to 88% on room air, patient does not have a history of COPD or any oxygen requirement.  Chest x-ray was negative although chest CT without contrast did show mild bilateral haziness which may be early pneumonia.  Patient denies tobacco use.  Patient was treated with ceftriaxone azithromycin and called in for admission. ?  ?Improving over interval.  Remains on 3 L nasal cannula as of 4/20.  Afebrile over interval.  As of 4/21 patient weaned to room air.  Afebrile.  Feels well.  Cough improved.  Stable for discharge.  However kidney function is worsening.  Tacrolimus level on admission is 16.  Still within range but Target range for tacrolimus in setting of kidney transplant usually 4-7.  Nephrology consulted.  Case discussed with them.  Recommend to hold Prograf for 1 to 2 days and get in touch with transplant nephrology team in [place] to determine immunosuppressive strategy.  Post discharge instructions explained at length with  patient and wife at bedside at time of discharge.  To complete course of treatment for community-acquired pneumonia and immunocompromise state will recommend 7-day course of antibiotics.  Augmentin, dose adjusted for kidney function, and azithromycin have been prescribed. ? ? ? ?Discharge Diagnoses:  ?Principal Problem: ?  CAP (community acquired pneumonia) ? ?Community-acquired pneumonia in immunocompromised patient ?Hemodynamically stable and afebrile over interval ?Influenza COVID-negative ?Plan: ?Tolerated Rocephin and azithromycin in house.  Afebrile.  Weaned to room air at time of discharge.  De-escalate to Augmentin 500-1 25.  Complete additional 4 days for total 7-day antibiotic course.  3-day course of azithromycin also recommended. ?Patient discharged home in stable condition ?  ?Abnormal EKG ?Elevated troponins ?History of coronary artery disease ?Atypical, nonspecific chest pain on admission ?T wave inversions noted inferolaterally ?Troponin elevation to 30, likely demand ischemia ?Low suspicion for ACS ?Patient states that he was taken off aspirin by his cardiologist.  Does not take Plavix ?Plan: ?Continue BiDil, Crestor, Zetia ?  ?Status post renal transplant ?CKD stage IV ?Creatinine ranges between 3 and 5.  At time of discharge creatinine up to 4.  Echoless minimal 16.  Nephrology consulted.  Recommend to hold tacrolimus 1 to 2 days post discharge.  As we are coming into the weekend I recommend patient hold his tacrolimus on Saturday and Sunday following discharge and follow-up with his transplant nephrology team in [place] on Monday 4/24.  At that time recommend repeat kidney function and determination on immunosuppressive strategy.  Post discharge instructions explained to patient and wife at length at bedside on  time of discharge.  Both expressed understanding. ? ?Discharge Instructions ? ?Discharge Instructions   ? ? Diet - low sodium heart healthy   Complete by: As directed ?  ? Increase  activity slowly   Complete by: As directed ?  ? ?  ? ?Allergies as of 06/06/2021   ?No Known Allergies ?  ? ?  ?Medication List  ?  ? ?STOP taking these medications   ? ?pantoprazole 40 MG tablet ?Commonly known as: PROTONIX ?  ?tacrolimus 1 MG capsule ?Commonly known as: PROGRAF ?  ?tiZANidine 4 MG tablet ?Commonly known as: Zanaflex ?  ?torsemide 20 MG tablet ?Commonly known as: DEMADEX ?  ? ?  ? ?TAKE these medications   ? ?amiodarone 200 MG tablet ?Commonly known as: PACERONE ?TAKE 1 TABLET BY MOUTH EVERY DAY ?  ?amoxicillin-clavulanate 500-125 MG tablet ?Commonly known as: Augmentin ?Take 1 tablet (500 mg total) by mouth 2 (two) times daily for 4 days. ?Start taking on: June 07, 2021 ?  ?azithromycin 500 MG tablet ?Commonly known as: Zithromax ?Take 1 tablet (500 mg total) by mouth daily for 2 days. Take 1 tablet daily for 3 days. ?Start taking on: June 07, 2021 ?  ?benzonatate 200 MG capsule ?Commonly known as: TESSALON ?Take 1 capsule (200 mg total) by mouth 3 (three) times daily. ?  ?BiDil 20-37.5 MG tablet ?Generic drug: isosorbide-hydrALAZINE ?TAKE 2 TABLETS BY MOUTH 3 TIMES DAILY. ?  ?carvedilol 25 MG tablet ?Commonly known as: COREG ?TAKE 1 TABLET BY MOUTH TWICE A DAY ?  ?ezetimibe 10 MG tablet ?Commonly known as: Zetia ?Take 1 tablet (10 mg total) by mouth daily. ?  ?fluorouracil 5 % cream ?Commonly known as: EFUDEX ?Apply 1 application. topically as directed. ?  ?glipiZIDE 10 MG tablet ?Commonly known as: GLUCOTROL ?Take 10 mg by mouth 2 (two) times daily before a meal. ?  ?loratadine 10 MG tablet ?Commonly known as: CLARITIN ?Take 10 mg by mouth daily as needed for allergies. ?  ?nitroGLYCERIN 0.4 MG SL tablet ?Commonly known as: Nitrostat ?Place 1 tablet (0.4 mg total) under the tongue every 5 (five) minutes as needed for chest pain. ?  ?predniSONE 5 MG tablet ?Commonly known as: DELTASONE ?Take 1 tablet (5 mg total) by mouth daily with breakfast. ?  ?rosuvastatin 40 MG tablet ?Commonly known as:  CRESTOR ?TAKE 1 TABLET BY MOUTH EVERY DAY ?  ?simethicone 125 MG chewable tablet ?Commonly known as: MYLICON ?Chew 125 mg by mouth every 6 (six) hours as needed for flatulence. ?  ?warfarin 1 MG tablet ?Commonly known as: COUMADIN ?Take as directed. If you are unsure how to take this medication, talk to your nurse or doctor. ?Original instructions: TAKE 2 TO 3 TABLETS BY MOUTH AS DIRECTED BY ANTICOAGULATION CLINIC. 30 DAY SUPPLY PER MD ?What changed: See the new instructions. ?  ? ?  ? ? ?No Known Allergies ? ?Consultations: ?Nephrology ? ? ?Procedures/Studies: ?DG Chest 2 View ? ?Result Date: 06/04/2021 ?CLINICAL DATA:  chest pain and SHOB EXAM: CHEST - 2 VIEW COMPARISON:  December 10, 2019. FINDINGS: No consolidation. No visible pleural effusions or pneumothorax. Cardiomediastinal silhouette is within normal limits. No displaced fracture. Left axillary stent. IMPRESSION: No evidence of acute cardiopulmonary disease. Electronically Signed   By: Margaretha Sheffield M.D.   On: 06/04/2021 12:02  ? ?CT Chest Wo Contrast ? ?Result Date: 06/04/2021 ?CLINICAL DATA:  Respiratory illness, nondiagnostic chest radiograph EXAM: CT CHEST WITHOUT CONTRAST TECHNIQUE: Multidetector CT imaging of the chest was performed  following the standard protocol without IV contrast. RADIATION DOSE REDUCTION: This exam was performed according to the departmental dose-optimization program which includes automated exposure control, adjustment of the mA and/or kV according to patient size and/or use of iterative reconstruction technique. COMPARISON:  Previous studies including the chest radiograph done earlier today FINDINGS: Cardiovascular: Coronary artery calcifications are seen. There is ectasia of main pulmonary artery measuring 3.3 cm. Heart is enlarged in size. Mediastinum/Nodes: There are few slightly enlarged nodes in the mediastinum largest of the nodes is noted in the subcarinal region measuring approximally 11 mm in short axis. There is  inhomogeneous attenuation in the thyroid with possible 1 cm nodule in the right lobe. Lungs/Pleura: There is subtle increase in interstitial markings in both parahilar regions and both lower lung fields. There is n

## 2021-06-06 NOTE — Consult Note (Signed)
ANTICOAGULATION CONSULT NOTE - Follow up ? ?Pharmacy Consult for warfarin ?Indication: atrial fibrillation ? ?No Known Allergies ? ?Patient Measurements: ?Height: 6\' 1"  (185.4 cm) ?Weight: 88.5 kg (195 lb 1.7 oz) ?IBW/kg (Calculated) : 79.9 ? ?Vital Signs: ?Temp: 98.1 ?F (36.7 ?C) (04/21 0409) ?BP: 126/85 (04/21 0409) ?Pulse Rate: 66 (04/21 0409) ? ?Labs: ?Recent Labs  ?  06/04/21 ?1140 06/04/21 ?1403 06/04/21 ?1630 06/05/21 ?8242 06/06/21 ?0421  ?HGB 12.7*  --  12.9* 12.7*  --   ?HCT 40.7  --  41.6 40.6  --   ?PLT 155  --  149* 152  --   ?APTT 41*  --   --   --   --   ?LABPROT 16.7*  --   --  19.4* 25.4*  ?INR 1.4*  --   --  1.7* 2.3*  ?CREATININE 3.11*  --  3.27* 3.08*  --   ?TROPONINIHS 30* 32*  --   --   --   ? ? ?Estimated Creatinine Clearance: 28.8 mL/min (A) (by C-G formula based on SCr of 3.08 mg/dL (H)). ? ? ?Medical History: ?Past Medical History:  ?Diagnosis Date  ? CHF (congestive heart failure) ([place])   ? Chronic kidney disease 04/2009  ? Kidney Transplant  ? Diabetes mellitus   ? GERD (gastroesophageal reflux disease)   ? as needed reflux  ? Heart attack ([place]) 02/02/2020  ? Hypertension   ? 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  ? ? ?Medications:  ?Warfarin PTA Regimen: ?3mg  Mon, Fri; 2mg  rest of the week (per anticoag visit summary on 05/21/21) ?Total weekly dose 16mg  ? ?Assessment: ? 61 y.o. male s/p renal transplant 2011, PAF on warfarin chronic therapy with regimen as above presented [facility] 06/04/21 with cough and chest pain. Pharmacy consulted to manage warfarin therapy while in patient. INR noted to be subtherapeutic at 1.4 (Goal 2-3) on admission.  ? ?Date INR Warfarin Dose  ?4/18 -- 3 mg  ?4/19 1.4 5 mg  ?4/20 1.7 4 mg ?4/21 2.3 3mg  today, resume PTA dose ? ?DDIs: ?Amiodarone 100 mg daily -- PTA/chronic med ?4/19 Ceftriaxone 1 gram + Azithromycin 500 mg daily >  ? ?Goal of Therapy:  ?INR 2-3 ?Monitor platelets by anticoagulation protocol: Yes ?  ?Plan:  ?INR  therapeutic today. Will resume warfarin PTA dose and continue close monitoring while inpatient ?Continue to monitor INR daily  ? ?Avelardo Reesman Rodriguez-Guzman PharmD, BCPS ?06/06/2021 7:45 AM ? ? ? ? ?

## 2021-06-06 NOTE — Progress Notes (Signed)
PHARMACIST - PHYSICIAN COMMUNICATION ?DR:   Hal Neer ?CONCERNING: Antibiotic IV to Oral Route Change Policy ? ?RECOMMENDATION: ?This patient is receiving Azithromycin by the intravenous route.  Based on criteria approved by the Pharmacy and Therapeutics Committee, the antibiotic(s) is/are being converted to the equivalent oral dose form(s). ? ? ?DESCRIPTION: ?These criteria include: ?Patient being treated for a respiratory tract infection, urinary tract infection, cellulitis or clostridium difficile associated diarrhea if on metronidazole ?The patient is not neutropenic and does not exhibit a GI malabsorption state ?The patient is eating (either orally or via tube) and/or has been taking other orally administered medications for a least 24 hours ?The patient is improving clinically and has a Tmax < 100.5 ? ?If you have questions about this conversion, please contact the Pharmacy Department  ?[]   [phone] )  Casey [name] ?[x]   [phone] )  Casey [name] ?[]   [phone] )  Casey [name] ?[]   [phone] )  Casey [name] ?[]   [phone] )  Casey [name]  ? ?Casey [name] PharmD, BCPS ?06/06/2021 8:32 AM ? ? ?

## 2021-06-09 LAB — CULTURE, BLOOD (ROUTINE X 2)
Culture: NO GROWTH
Culture: NO GROWTH

## 2021-06-11 ENCOUNTER — Ambulatory Visit (INDEPENDENT_AMBULATORY_CARE_PROVIDER_SITE_OTHER): Payer: BC Managed Care – PPO

## 2021-06-11 DIAGNOSIS — I236 Thrombosis of atrium, auricular appendage, and ventricle as current complications following acute myocardial infarction: Secondary | ICD-10-CM | POA: Diagnosis not present

## 2021-06-11 DIAGNOSIS — Z5181 Encounter for therapeutic drug level monitoring: Secondary | ICD-10-CM | POA: Diagnosis not present

## 2021-06-11 DIAGNOSIS — I4891 Unspecified atrial fibrillation: Secondary | ICD-10-CM | POA: Diagnosis not present

## 2021-06-11 DIAGNOSIS — Z7901 Long term (current) use of anticoagulants: Secondary | ICD-10-CM | POA: Diagnosis not present

## 2021-06-11 DIAGNOSIS — I2102 ST elevation (STEMI) myocardial infarction involving left anterior descending coronary artery: Secondary | ICD-10-CM | POA: Diagnosis not present

## 2021-06-11 LAB — POCT INR: INR: 2.7 (ref 2.0–3.0)

## 2021-06-11 NOTE — Patient Instructions (Signed)
-   Continue  2 tablets daily except for 3 tablets on MONDAYS & FRIDAYS ?-Recheck INR in 4 weeks ?Coumadin Clinic [facility] [phone]. ?

## 2021-06-28 ENCOUNTER — Other Ambulatory Visit (HOSPITAL_COMMUNITY): Payer: Self-pay | Admitting: Cardiology

## 2021-06-28 ENCOUNTER — Other Ambulatory Visit (HOSPITAL_COMMUNITY): Payer: Self-pay | Admitting: Adult Health

## 2021-06-28 ENCOUNTER — Other Ambulatory Visit: Payer: Self-pay | Admitting: Cardiology

## 2021-06-28 DIAGNOSIS — I236 Thrombosis of atrium, auricular appendage, and ventricle as current complications following acute myocardial infarction: Secondary | ICD-10-CM

## 2021-06-30 NOTE — Telephone Encounter (Signed)
Prescription refill request received for warfarin ?Lov: 02/27/21 Aundra Dubin) ?Next INR check:  ?Warfarin tablet strength: 1mg  ? ?Appropriate dose and refill sent to requested pharmacy.  ?

## 2021-07-03 ENCOUNTER — Ambulatory Visit (HOSPITAL_COMMUNITY): Payer: BC Managed Care – PPO

## 2021-07-03 ENCOUNTER — Encounter (HOSPITAL_COMMUNITY): Payer: BC Managed Care – PPO | Admitting: Cardiology

## 2021-07-09 ENCOUNTER — Ambulatory Visit (INDEPENDENT_AMBULATORY_CARE_PROVIDER_SITE_OTHER): Payer: BC Managed Care – PPO

## 2021-07-09 DIAGNOSIS — I236 Thrombosis of atrium, auricular appendage, and ventricle as current complications following acute myocardial infarction: Secondary | ICD-10-CM | POA: Diagnosis not present

## 2021-07-09 DIAGNOSIS — I4891 Unspecified atrial fibrillation: Secondary | ICD-10-CM | POA: Diagnosis not present

## 2021-07-09 DIAGNOSIS — I2102 ST elevation (STEMI) myocardial infarction involving left anterior descending coronary artery: Secondary | ICD-10-CM | POA: Diagnosis not present

## 2021-07-09 DIAGNOSIS — Z5181 Encounter for therapeutic drug level monitoring: Secondary | ICD-10-CM

## 2021-07-09 DIAGNOSIS — Z7901 Long term (current) use of anticoagulants: Secondary | ICD-10-CM | POA: Diagnosis not present

## 2021-07-09 LAB — POCT INR: INR: 2.9 (ref 2.0–3.0)

## 2021-07-09 NOTE — Patient Instructions (Signed)
-   Continue  2 tablets daily except for 3 tablets on MONDAYS & FRIDAYS -Recheck INR in 4 weeks Coumadin Clinic [facility] [phone].

## 2021-07-16 ENCOUNTER — Other Ambulatory Visit (HOSPITAL_COMMUNITY): Payer: Self-pay | Admitting: Adult Health

## 2021-08-11 ENCOUNTER — Other Ambulatory Visit (HOSPITAL_COMMUNITY): Payer: Self-pay

## 2021-08-11 MED ORDER — NITROGLYCERIN 0.4 MG SL SUBL: 0.4000 mg | SUBLINGUAL_TABLET | SUBLINGUAL | 3 refills | Status: AC | PRN

## 2021-08-13 ENCOUNTER — Ambulatory Visit (INDEPENDENT_AMBULATORY_CARE_PROVIDER_SITE_OTHER): Payer: BC Managed Care – PPO

## 2021-08-13 DIAGNOSIS — I4891 Unspecified atrial fibrillation: Secondary | ICD-10-CM | POA: Diagnosis not present

## 2021-08-13 DIAGNOSIS — I236 Thrombosis of atrium, auricular appendage, and ventricle as current complications following acute myocardial infarction: Secondary | ICD-10-CM | POA: Diagnosis not present

## 2021-08-13 DIAGNOSIS — I2102 ST elevation (STEMI) myocardial infarction involving left anterior descending coronary artery: Secondary | ICD-10-CM | POA: Diagnosis not present

## 2021-08-13 DIAGNOSIS — Z5181 Encounter for therapeutic drug level monitoring: Secondary | ICD-10-CM | POA: Diagnosis not present

## 2021-08-13 DIAGNOSIS — Z7901 Long term (current) use of anticoagulants: Secondary | ICD-10-CM | POA: Diagnosis not present

## 2021-08-13 LAB — POCT INR: INR: 1.7 — AB (ref 2.0–3.0)

## 2021-08-13 NOTE — Patient Instructions (Signed)
-   TAKE 3 TABLETS TODAY ONLY and then Continue  2 tablets daily except for 3 tablets on MONDAYS & FRIDAYS -Recheck INR in 4 weeks Coumadin Clinic [facility] [phone].

## 2021-09-10 ENCOUNTER — Ambulatory Visit (INDEPENDENT_AMBULATORY_CARE_PROVIDER_SITE_OTHER): Payer: BC Managed Care – PPO

## 2021-09-10 DIAGNOSIS — I236 Thrombosis of atrium, auricular appendage, and ventricle as current complications following acute myocardial infarction: Secondary | ICD-10-CM

## 2021-09-10 DIAGNOSIS — I4891 Unspecified atrial fibrillation: Secondary | ICD-10-CM

## 2021-09-10 DIAGNOSIS — Z7901 Long term (current) use of anticoagulants: Secondary | ICD-10-CM

## 2021-09-10 DIAGNOSIS — Z5181 Encounter for therapeutic drug level monitoring: Secondary | ICD-10-CM

## 2021-09-10 DIAGNOSIS — I2102 ST elevation (STEMI) myocardial infarction involving left anterior descending coronary artery: Secondary | ICD-10-CM

## 2021-09-10 LAB — POCT INR: INR: 2 (ref 2.0–3.0)

## 2021-09-10 NOTE — Patient Instructions (Signed)
Continue  2 tablets daily except for 3 tablets on MONDAYS & FRIDAYS -Recheck INR in 6 weeks Coumadin Clinic [facility] [phone].

## 2021-09-30 ENCOUNTER — Encounter (HOSPITAL_COMMUNITY): Payer: Self-pay | Admitting: Cardiology

## 2021-09-30 ENCOUNTER — Ambulatory Visit (HOSPITAL_BASED_OUTPATIENT_CLINIC_OR_DEPARTMENT_OTHER)
Admission: RE | Admit: 2021-09-30 | Discharge: 2021-09-30 | Disposition: A | Discharge status: Home / Self Care | Payer: BC Managed Care – PPO | Source: Ambulatory Visit | Attending: Cardiology | Admitting: Cardiology

## 2021-09-30 ENCOUNTER — Ambulatory Visit (HOSPITAL_COMMUNITY)
Admission: RE | Admit: 2021-09-30 | Discharge: 2021-09-30 | Disposition: A | Discharge status: Home / Self Care | Payer: BC Managed Care – PPO | Source: Ambulatory Visit | Attending: Cardiology | Admitting: Cardiology

## 2021-09-30 VITALS — BP 140/88 | HR 62 | Wt 204.2 lb

## 2021-09-30 DIAGNOSIS — E782 Mixed hyperlipidemia: Secondary | ICD-10-CM | POA: Diagnosis not present

## 2021-09-30 DIAGNOSIS — Z940 Kidney transplant status: Secondary | ICD-10-CM | POA: Diagnosis not present

## 2021-09-30 DIAGNOSIS — Z7901 Long term (current) use of anticoagulants: Secondary | ICD-10-CM | POA: Diagnosis not present

## 2021-09-30 DIAGNOSIS — Z79899 Other long term (current) drug therapy: Secondary | ICD-10-CM | POA: Insufficient documentation

## 2021-09-30 DIAGNOSIS — I480 Paroxysmal atrial fibrillation: Secondary | ICD-10-CM | POA: Diagnosis not present

## 2021-09-30 DIAGNOSIS — E1122 Type 2 diabetes mellitus with diabetic chronic kidney disease: Secondary | ICD-10-CM | POA: Insufficient documentation

## 2021-09-30 DIAGNOSIS — I255 Ischemic cardiomyopathy: Secondary | ICD-10-CM | POA: Diagnosis not present

## 2021-09-30 DIAGNOSIS — I509 Heart failure, unspecified: Secondary | ICD-10-CM | POA: Diagnosis not present

## 2021-09-30 DIAGNOSIS — Z955 Presence of coronary angioplasty implant and graft: Secondary | ICD-10-CM | POA: Insufficient documentation

## 2021-09-30 DIAGNOSIS — I2510 Atherosclerotic heart disease of native coronary artery without angina pectoris: Secondary | ICD-10-CM | POA: Diagnosis not present

## 2021-09-30 DIAGNOSIS — I252 Old myocardial infarction: Secondary | ICD-10-CM | POA: Diagnosis not present

## 2021-09-30 DIAGNOSIS — I130 Hypertensive heart and chronic kidney disease with heart failure and stage 1 through stage 4 chronic kidney disease, or unspecified chronic kidney disease: Secondary | ICD-10-CM | POA: Diagnosis not present

## 2021-09-30 DIAGNOSIS — I5022 Chronic systolic (congestive) heart failure: Secondary | ICD-10-CM | POA: Insufficient documentation

## 2021-09-30 DIAGNOSIS — N184 Chronic kidney disease, stage 4 (severe): Secondary | ICD-10-CM | POA: Insufficient documentation

## 2021-09-30 LAB — TSH: TSH: 0.921 u[IU]/mL (ref 0.350–4.500)

## 2021-09-30 LAB — LIPID PANEL
Cholesterol: 123 mg/dL (ref 0–200)
HDL: 42 mg/dL (ref >40)
LDL Cholesterol: 61 mg/dL (ref 0–99)
Total CHOL/HDL Ratio: 2.9 RATIO
Triglycerides: 99 mg/dL (ref <150)
VLDL: 20 mg/dL (ref 0–40)

## 2021-09-30 LAB — COMPREHENSIVE METABOLIC PANEL
ALT: 12 U/L (ref 0–44)
AST: 14 U/L — ABNORMAL LOW (ref 15–41)
Albumin: 3.3 g/dL — ABNORMAL LOW (ref 3.5–5.0)
Alkaline Phosphatase: 46 U/L (ref 38–126)
Anion gap: 7 (ref 5–15)
BUN: 39 mg/dL — ABNORMAL HIGH (ref 8–23)
CO2: 19 mmol/L — ABNORMAL LOW (ref 22–32)
Calcium: 9.4 mg/dL (ref 8.9–10.3)
Chloride: 114 mmol/L — ABNORMAL HIGH (ref 98–111)
Creatinine, Ser: 3.9 mg/dL — ABNORMAL HIGH (ref 0.61–1.24)
GFR, Estimated: 17 mL/min — ABNORMAL LOW (ref >60)
Glucose, Bld: 203 mg/dL — ABNORMAL HIGH (ref 70–99)
Potassium: 3.9 mmol/L (ref 3.5–5.1)
Sodium: 140 mmol/L (ref 135–145)
Total Bilirubin: 0.4 mg/dL (ref 0.3–1.2)
Total Protein: 6.6 g/dL (ref 6.5–8.1)

## 2021-09-30 LAB — ECHOCARDIOGRAM COMPLETE
AR max vel: 1.44 cm2
AV Area VTI: 1.39 cm2
AV Area mean vel: 1.36 cm2
AV Mean grad: 5 mmHg
AV Peak grad: 9.4 mmHg
Ao pk vel: 1.53 m/s
Area-P 1/2: 3.4 cm2
S' Lateral: 4 cm

## 2021-09-30 LAB — CBC
HCT: 37.1 % — ABNORMAL LOW (ref 39.0–52.0)
Hemoglobin: 12 g/dL — ABNORMAL LOW (ref 13.0–17.0)
MCH: 27.4 pg (ref 26.0–34.0)
MCHC: 32.3 g/dL (ref 30.0–36.0)
MCV: 84.7 fL (ref 80.0–100.0)
Platelets: 232 10*3/uL (ref 150–400)
RBC: 4.38 MIL/uL (ref 4.22–5.81)
RDW: 13.8 % (ref 11.5–15.5)
WBC: 10.9 10*3/uL — ABNORMAL HIGH (ref 4.0–10.5)
nRBC: 0 % (ref 0.0–0.2)

## 2021-09-30 MED ORDER — PERFLUTREN LIPID MICROSPHERE
1.0000 mL | INTRAVENOUS | Status: DC | PRN
  Administered 2021-09-30: 3 mL via INTRAVENOUS

## 2021-09-30 NOTE — Patient Instructions (Signed)
There has been no changes to your medications.  Labs done today, your results will be available in MyChart, we will contact you for abnormal readings.  Your physician has requested that you have a cardiac MRI. Cardiac MRI uses a computer to create images of your heart as its beating, producing both still and moving pictures of your heart and major blood vessels. For further information please visit [email or web address] Please follow the instruction sheet given to you today for more information. ONCE APPROVED BY YOUR INSURANCE COMPANY YOU WILL BE CALLED TO ARRANGE THE TEST.  Your physician recommends that you schedule a follow-up appointment in: 4 months (December 2023)  ** please call the office in October to arrange your follow up appointment **  If you have any questions or concerns before your next appointment please send [place] a message through [place] or call our office at [phone].    TO LEAVE A MESSAGE FOR THE NURSE SELECT OPTION 2, PLEASE LEAVE A MESSAGE INCLUDING: YOUR NAME DATE OF BIRTH CALL BACK NUMBER REASON FOR CALL**this is important as we prioritize the call backs  YOU WILL RECEIVE A CALL BACK THE SAME DAY AS LONG AS YOU CALL BEFORE 4:00 PM  At the [facility], you and your health needs are our priority. As part of our continuing mission to provide you with exceptional heart care, we have created designated Provider Care Teams. These Care Teams include your primary Cardiologist (physician) and Advanced Practice Providers (APPs- Physician Assistants and Nurse Practitioners) who all work together to provide you with the care you need, when you need it.   You may see any of the following providers on your designated Care Team at your next follow up: Dr Glori Bickers Dr Haynes Kerns, NP Lyda Jester, [facility] [place], [place] Audry Riles, PharmD   Please be sure to bring in all your medications bottles to every appointment.

## 2021-09-30 NOTE — Progress Notes (Signed)
PCP: Dion Body, MD Cardiology: Dr. Aundra Dubin  61 y.o. with history of renal transplant in 2011, CAD s/p anterior MI, and ischemic cardiomyopathy presents for followup of CHF and CAD.  Patient had his renal transplant at [facility], and has been followed by [facility] as well as [facility] since that time.  He had been doing well until 10/21.  In early 10/21, he developed chest pain.  He did not go immediately to the ER, he presented when the pain had continued for > 1 day.  He was found to have acute anterior MI with late presentation. He went for cath, LAD was occluded and there was severe diffuse disease in the RCA as well as severe disease in the LCx system.  He had DES to the LAD.  He subsequently developed cardiogenic shock as well as suspected septic shock, possible from gut source.  Abdominal imaging showed profound ileus.  He was taken to the OR for Impella 5.5 placement, but this was deferred due to the finding of LV thrombus on intra-op TEE.  He also developed atrial fibrillation with RVR, controlled by amiodarone and eventually converted back to NSR.  He was maintained on milrinone 0.25 with gradual improvement.  However, he developed AKI, likely due to contrast as well as cardiorenal. Creatinine went up to 5.  Creatinine gradually improved down to 3.38 at discharge, and we were able to discontinue milrinone.  Ileus also gradually resolved.  Echo in the hospital showed EF 20-25%. He initially wore a Lifevest but has now sent it back.   Echo in 1/22 showed EF 30-35%, periapical akinesis, no LV thrombus, normal RV.  He saw EP to discuss ICD and atrial fibrillation ablation, but he was not ready for this yet.  However, repeat echo in 5/22 showed EF up to 40-45% with normal RV function.   Patient was admitted in 4/23 with PNA.   Echo was done today and reviewed, EF 35% with peri-apical akinesis, no LV thrombus, normal RV.   Patient returns for followup.  He has not been using torsemide.   Symptomatically doing well.  No significant exertional dyspnea, even does some jogging.  No chest pain.  No lightheadedness.  No orthopnea/PND. SBP 110s-120s at home (higher here today).   Labs (10/21): K 4.3, creatinine 3.38 Labs (11/21): K 4.7, creatinine 2.24, LDL 68, HDL 43, hgb 11.9, LFTs normal Labs (12/21): K 3.9, creatinine 2.43 Labs (2/22): LDL 81, LFTs normal, K 3.7, creatinine 2.49 Labs (8/22): LDL 72, TGs 109 Labs (12/22): LFTs normal, K 3.9, creatinine 3.2 Labs (4/23): K 4.2, creatinine 4.04  PMH: 1. Type 2 diabetes 2. Atrial fibrillation: Paroxysmal, on amiodarone.  3. Renal transplant 2011 [facility].  4. GERD 5. 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 CAD: Late presentation anterior MI in 10/21.  Cath with occluded LAD, long up to 99% mid-distal RCA stenosis, 95% mid-distal LCx, 80% OM3.  He had DES to LAD.   8. Chronic systolic CHF: Ischemic cardiomyopathy. Cardiogenic shock in 10/21 with MI.  - Echo (10/21): EF 20-25%, heavy smoke/early thrombus LV apex, RV normal.  - RHC (10/21, milrinone 0.25): mean RA 4, PA 22/4, mean PCWP 6, CI 2.06 Fick, CI 2.06 thermo.  - Echo (1/22): EF 30-35%, periapical akinesis, no LV thrombus, normal RV.  - Echo (5/22): EF 40-45% with periapical akinesis, normal RV.  - Echo (8/23): EF 35% with peri-apical akinesis, no LV thrombus, normal RV. 9. LV thrombus  SH: Married, nonsmoker, no ETOH.  Lives  in [place]. 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   Family History  Problem Relation Age of Onset   Diabetes Mother    Hyperlipidemia Mother    Cancer Father    ROS: all systems reviewed and negative except as per HPI.   Current Outpatient Medications  Medication Sig Dispense Refill   amiodarone (PACERONE) 100 MG tablet Take 100 mg by mouth daily.     BIDIL 20-37.5 MG tablet TAKE 2 TABLETS BY MOUTH 3 TIMES DAILY. 540 tablet 2   carvedilol (COREG) 25 MG tablet TAKE 1 TABLET BY MOUTH TWICE A DAY 180 tablet 3   ezetimibe (ZETIA) 10 MG tablet TAKE 1  TABLET BY MOUTH EVERY DAY 90 tablet 3   fluorouracil (EFUDEX) 5 % cream Apply 1 application. topically as directed.     glipiZIDE (GLUCOTROL) 10 MG tablet Take 10 mg by mouth 2 (two) times daily before a meal.     loratadine (CLARITIN) 10 MG tablet Take 10 mg by mouth daily as needed for allergies.     nitroGLYCERIN (NITROSTAT) 0.4 MG SL tablet Place 1 tablet (0.4 mg total) under the tongue every 5 (five) minutes as needed for chest pain. 30 tablet 3   predniSONE (DELTASONE) 5 MG tablet Take 1 tablet (5 mg total) by mouth daily with breakfast.     rosuvastatin (CRESTOR) 40 MG tablet TAKE 1 TABLET BY MOUTH EVERY DAY 90 tablet 3   simethicone (MYLICON) 829 MG chewable tablet Chew 125 mg by mouth every 6 (six) hours as needed for flatulence.     warfarin (COUMADIN) 1 MG tablet TAKE 2 TO 3 TABLETS BY MOUTH AS DIRECTED BY ANTICOAGULATION CLINIC. 200 tablet 1   No current facility-administered medications for this encounter.   BP (!) 140/88   Pulse 62   Wt 92.6 kg (204 lb 3.2 oz)   SpO2 97%   BMI 26.94 kg/m  General: NAD Neck: No JVD, no thyromegaly or thyroid nodule.  Lungs: Clear to auscultation bilaterally with normal respiratory effort. CV: Nondisplaced PMI.  Heart regular S1/S2, no S3/S4, no murmur.  No peripheral edema.  No carotid bruit.  Normal pedal pulses.  Abdomen: Soft, nontender, no hepatosplenomegaly, no distention.  Skin: Intact without lesions or rashes.  Neurologic: Alert and oriented x 3.  Psych: Normal affect. Extremities: No clubbing or cyanosis.  HEENT: Normal.   Assessment/Plan: 1. CAD: S/p late presentation anterior MI in 10/21 with DES to LAD.  He has residual severe disease in the RCA which is not revascularizable.   He has 95% mid-distal LCx stenosis and 80% OM3 stenosis that could potentially be intervened upon.  No chest pain.  - He is on warfarin so no ASA. - Continue statin, check lipids today.  - Medical management planned for LCx system given elevated  creatinine and no chest pain.  2. Atrial fibrillation: Paroxysmal.  He is in NSR today.  - Continue amiodarone 100 mg daily.  Check LFTs and TSH today, will need regular eye exam.  - Continue warfarin.  - Recommended atrial fibrillation ablation so he can safely stop amiodarone. Dr. Curt Bears has seen him and offered ablation.  He has not decided whether he wants to do this.   3. Chronic systolic CHF: Ischemic cardiomyopathy.  Echo in 10/21 with EF 20-25%.  Echo in 1/22 showed EF 30-35% with regional wall motion abnormalities.  Echo in 5/22 with EF up to 40-45%.  Echo today was reviewed EF 35% with apical akinesis.  Not volume overloaded on  exam, NYHA class I-II.    - He can continue to use torsemide prn.  - Continue Bidil 2 tabs tid.  - Continue Coreg 25 mg bid.  - No ARNI/spironolactone/Farxiga with creatinine > 3.  - EF in borderline range for ICD, he has refused in the past but suspect he has significant scar.  I will arrange for cardiac MRI to quantify LV systolic function, if EF < 35% I would recommend ICD.   4. LV thrombus: No thrombus on 8/23 echo.   - Continue warfarin.  5. CKD stage 4 in setting of renal transplant: Needs repeat BMET today.   Followup in 4 months.   Loralie Champagne 09/30/2021

## 2021-09-30 NOTE — Progress Notes (Signed)
  Echocardiogram 2D Echocardiogram has been performed.  Casey [name] 09/30/2021, 10:09 AM

## 2021-10-12 ENCOUNTER — Other Ambulatory Visit (HOSPITAL_COMMUNITY): Payer: Self-pay | Admitting: Cardiology

## 2021-10-15 ENCOUNTER — Other Ambulatory Visit (HOSPITAL_COMMUNITY): Payer: Self-pay | Admitting: Cardiology

## 2021-10-16 ENCOUNTER — Other Ambulatory Visit (HOSPITAL_COMMUNITY): Payer: Self-pay | Admitting: *Deleted

## 2021-10-16 MED ORDER — ISOSORB DINITRATE-HYDRALAZINE 20-37.5 MG PO TABS: ORAL_TABLET | ORAL | 2 refills | Status: DC

## 2021-10-16 MED ORDER — CARVEDILOL 25 MG PO TABS: 25.0000 mg | ORAL_TABLET | Freq: Two times a day (BID) | ORAL | 3 refills | Status: DC

## 2021-10-22 ENCOUNTER — Ambulatory Visit: Payer: BC Managed Care – PPO | Attending: Internal Medicine

## 2021-10-22 DIAGNOSIS — I236 Thrombosis of atrium, auricular appendage, and ventricle as current complications following acute myocardial infarction: Secondary | ICD-10-CM

## 2021-10-22 DIAGNOSIS — Z7901 Long term (current) use of anticoagulants: Secondary | ICD-10-CM

## 2021-10-22 DIAGNOSIS — I4891 Unspecified atrial fibrillation: Secondary | ICD-10-CM

## 2021-10-22 DIAGNOSIS — I2102 ST elevation (STEMI) myocardial infarction involving left anterior descending coronary artery: Secondary | ICD-10-CM | POA: Diagnosis not present

## 2021-10-22 DIAGNOSIS — Z5181 Encounter for therapeutic drug level monitoring: Secondary | ICD-10-CM | POA: Diagnosis not present

## 2021-10-22 LAB — POCT INR: INR: 1.5 — AB (ref 2.0–3.0)

## 2021-10-22 NOTE — Patient Instructions (Signed)
TAKE 3 TABLETS TODAY ONLY and then Continue  2 tablets daily except for 3 tablets on MONDAYS & FRIDAYS -Recheck INR in 4 weeks Coumadin Clinic [facility] [phone].

## 2021-11-19 ENCOUNTER — Ambulatory Visit: Payer: BC Managed Care – PPO

## 2021-11-19 ENCOUNTER — Telehealth: Payer: Self-pay

## 2021-11-19 NOTE — Telephone Encounter (Signed)
Lpmtcb and reschedule INR appt. 

## 2021-11-26 ENCOUNTER — Telehealth (HOSPITAL_COMMUNITY): Payer: Self-pay | Admitting: *Deleted

## 2021-12-10 ENCOUNTER — Ambulatory Visit: Payer: BC Managed Care – PPO | Attending: Internal Medicine

## 2021-12-10 DIAGNOSIS — Z7901 Long term (current) use of anticoagulants: Secondary | ICD-10-CM

## 2021-12-10 DIAGNOSIS — I2102 ST elevation (STEMI) myocardial infarction involving left anterior descending coronary artery: Secondary | ICD-10-CM | POA: Diagnosis not present

## 2021-12-10 DIAGNOSIS — I236 Thrombosis of atrium, auricular appendage, and ventricle as current complications following acute myocardial infarction: Secondary | ICD-10-CM

## 2021-12-10 DIAGNOSIS — I4891 Unspecified atrial fibrillation: Secondary | ICD-10-CM

## 2021-12-10 DIAGNOSIS — Z5181 Encounter for therapeutic drug level monitoring: Secondary | ICD-10-CM

## 2021-12-10 LAB — POCT INR: INR: 1.5 — AB (ref 2.0–3.0)

## 2021-12-10 NOTE — Patient Instructions (Signed)
INCREASE TO  2 tablets daily except for 3 tablets on MONDAYS, [place] -Recheck INR in 3 weeks Coumadin Clinic [facility] [phone].

## 2021-12-11 ENCOUNTER — Telehealth (HOSPITAL_COMMUNITY): Payer: Self-pay | Admitting: Emergency Medicine

## 2021-12-11 NOTE — Telephone Encounter (Signed)
Attempted to call patient regarding upcoming cardiac MR appointment. Left message on voicemail with name and callback number Oakley Orban RN Navigator Cardiac Imaging [facility] [phone] Office [phone] Cell  

## 2021-12-12 ENCOUNTER — Ambulatory Visit (HOSPITAL_COMMUNITY): Payer: BC Managed Care – PPO

## 2021-12-31 ENCOUNTER — Ambulatory Visit: Payer: BC Managed Care – PPO | Attending: Cardiology

## 2021-12-31 DIAGNOSIS — Z7901 Long term (current) use of anticoagulants: Secondary | ICD-10-CM

## 2021-12-31 DIAGNOSIS — I4891 Unspecified atrial fibrillation: Secondary | ICD-10-CM | POA: Diagnosis not present

## 2021-12-31 DIAGNOSIS — I2102 ST elevation (STEMI) myocardial infarction involving left anterior descending coronary artery: Secondary | ICD-10-CM | POA: Diagnosis not present

## 2021-12-31 DIAGNOSIS — I236 Thrombosis of atrium, auricular appendage, and ventricle as current complications following acute myocardial infarction: Secondary | ICD-10-CM | POA: Diagnosis not present

## 2021-12-31 LAB — POCT INR: INR: 1.8 — AB (ref 2.0–3.0)

## 2021-12-31 NOTE — Patient Instructions (Signed)
TAKE 3 TABLETS TODAY THEN  INCREASE TO  3 tablets daily except for 2 tablets on MONDAYS, [place] -Recheck INR in 4 weeks Coumadin Clinic [facility] [phone].

## 2022-01-19 ENCOUNTER — Other Ambulatory Visit: Payer: Self-pay | Admitting: Family Medicine

## 2022-01-19 DIAGNOSIS — R7401 Elevation of levels of liver transaminase levels: Secondary | ICD-10-CM

## 2022-01-28 ENCOUNTER — Ambulatory Visit: Payer: BC Managed Care – PPO | Attending: Cardiology

## 2022-01-28 DIAGNOSIS — Z7901 Long term (current) use of anticoagulants: Secondary | ICD-10-CM | POA: Diagnosis not present

## 2022-01-28 DIAGNOSIS — I4891 Unspecified atrial fibrillation: Secondary | ICD-10-CM | POA: Diagnosis not present

## 2022-01-28 DIAGNOSIS — I2102 ST elevation (STEMI) myocardial infarction involving left anterior descending coronary artery: Secondary | ICD-10-CM

## 2022-01-28 DIAGNOSIS — I236 Thrombosis of atrium, auricular appendage, and ventricle as current complications following acute myocardial infarction: Secondary | ICD-10-CM | POA: Diagnosis not present

## 2022-01-28 DIAGNOSIS — Z5181 Encounter for therapeutic drug level monitoring: Secondary | ICD-10-CM

## 2022-01-28 LAB — POCT INR: INR: 3 (ref 2.0–3.0)

## 2022-01-28 NOTE — Patient Instructions (Signed)
Continue 3 tablets daily except for 2 tablets on MONDAYS, [place] -Recheck INR in 4 weeks Coumadin Clinic [facility] [phone].

## 2022-02-02 ENCOUNTER — Other Ambulatory Visit: Payer: BC Managed Care – PPO

## 2022-02-05 ENCOUNTER — Telehealth (HOSPITAL_COMMUNITY): Payer: Self-pay | Admitting: *Deleted

## 2022-02-05 NOTE — Telephone Encounter (Signed)
Reaching out to patient to offer assistance regarding upcoming cardiac imaging study; pt verbalizes understanding of appt date/time, but he wishes to reschedule.   I informed him that it may take a few months before we will be able to get him back on the schedule.  I told him I can cancel his appointment for tomorrow but our scheduling team will have to reach back out to reschedule him.  He verbalized understanding and wishes to be rescheduled.  Gordy Clement RN Navigator Cardiac Imaging [facility] [phone] office [phone] cell

## 2022-02-06 ENCOUNTER — Ambulatory Visit (HOSPITAL_COMMUNITY): Admission: RE | Admit: 2022-02-06 | Payer: BC Managed Care – PPO | Source: Ambulatory Visit

## 2022-02-23 IMAGING — DX DG ABD PORTABLE 1V
1 series · 1 of 1 positions shown · non-contrast
Comparison: None.

CLINICAL DATA: NG tube placement

EXAM:
PORTABLE ABDOMEN - 1 VIEW

[abdomen kub]
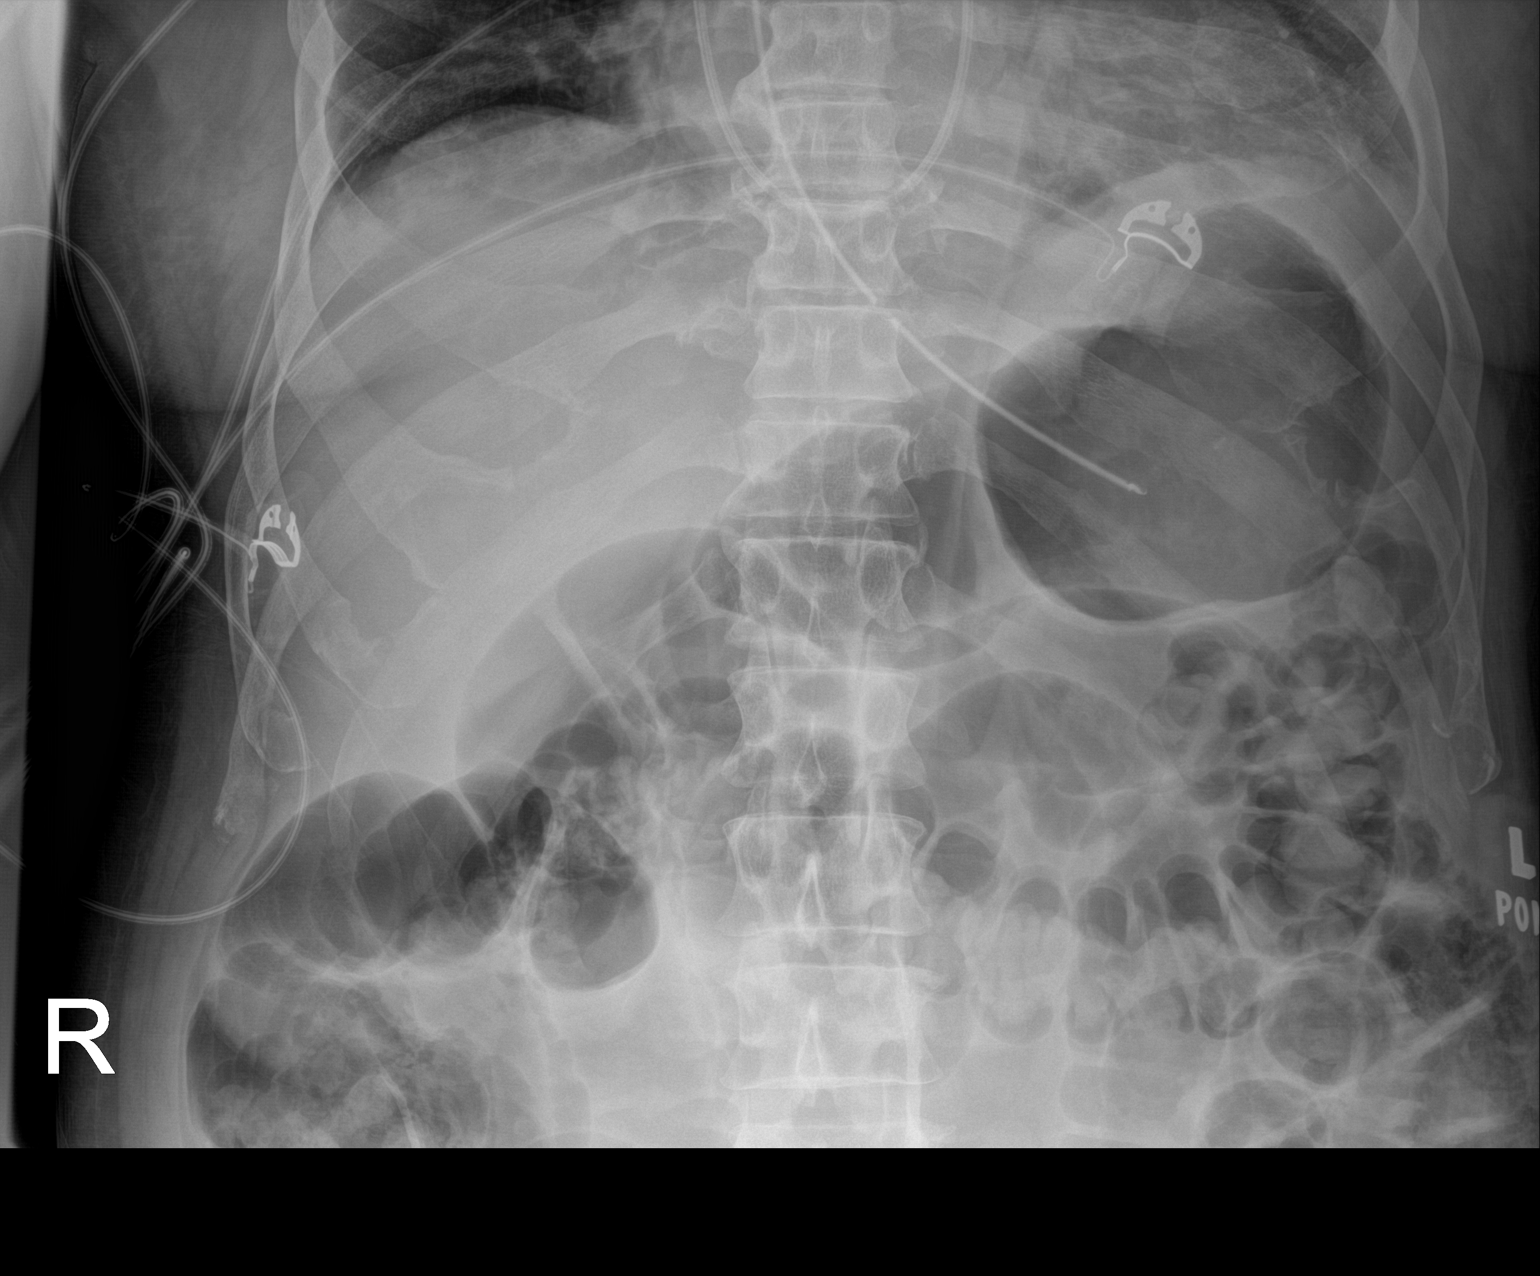

[1 of 1 positions shown; findings below may reference images not displayed]

FINDINGS: Enteric tube tip is within the stomach. Side port appears near the
gastroesophageal junction. Partially imaged air-filled loops of
bowel.
IMPRESSION: Enteric tube tip is within the stomach, side port near the
gastroesophageal junction. Could be advanced for optimal
positioning.

## 2022-02-23 IMAGING — DX DG CHEST 1V PORT
1 series · 1 of 1 positions shown · non-contrast
Comparison: 12/05/2019.

CLINICAL DATA: Intubation.  Swan-Ganz catheter

EXAM:
PORTABLE CHEST 1 VIEW

[chest ap]
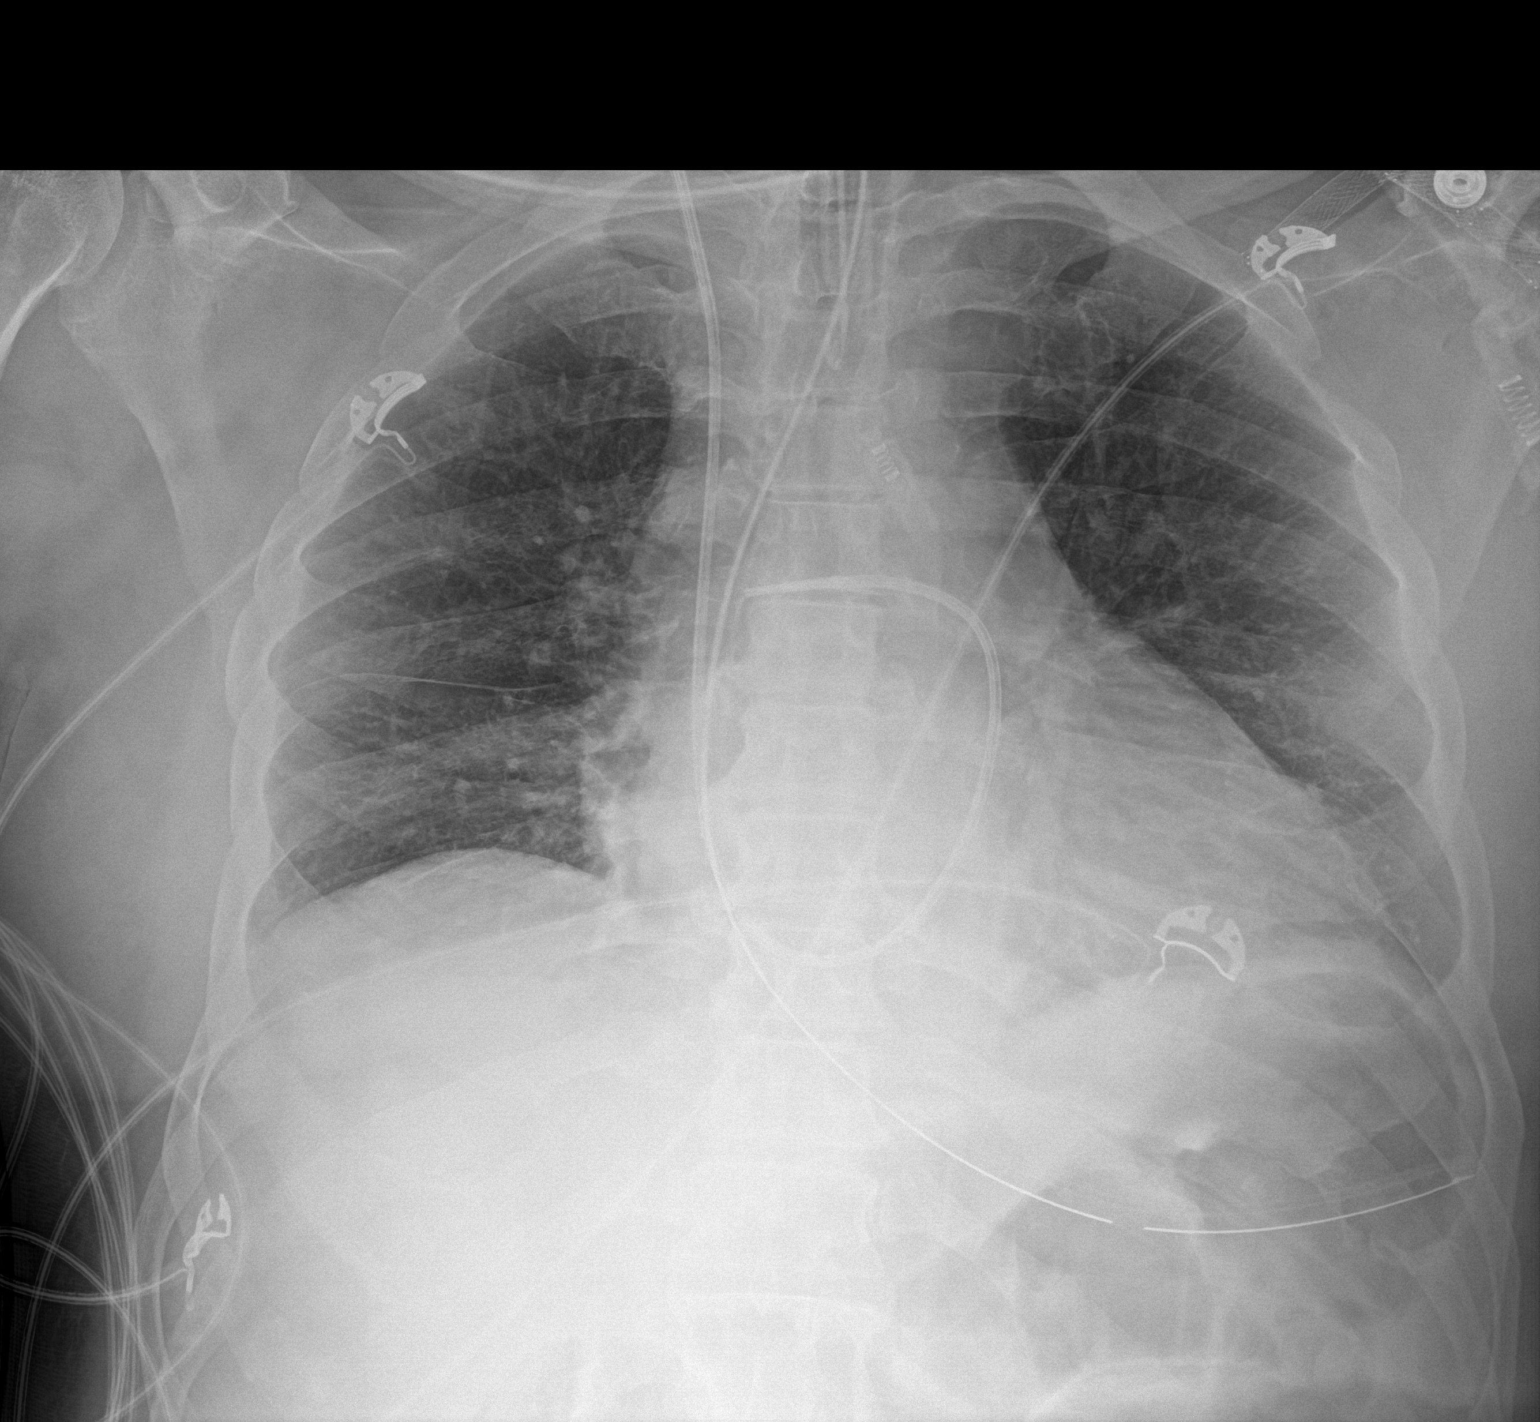

[1 of 1 positions shown; findings below may reference images not displayed]

FINDINGS: Endotracheal tube, NG tube, Swan-Ganz catheter in stable position.
Stable cardiomegaly. No pulmonary venous congestion. Low lung
volumes with basilar atelectasis. No focal infiltrate. No pleural
effusion or pneumothorax. Left subclavian stents again noted. These
are in an angled position and appear unchanged from 12/05/1999.
IMPRESSION: 1. Lines and tubes in stable position.
2. Stable cardiomegaly.
3. Low lung volumes with bibasilar atelectasis.

## 2022-02-23 IMAGING — CT CT ABD-PELV W/O CM
2 of 4 series · 15 of 46 positions shown, 17 images · non-contrast
Comparison: Renal ultrasound and portable abdominal radiographs
same date.

CLINICAL DATA: Bowel obstruction suspected. Question bowel
obstruction. History of diabetes and chronic kidney disease.

EXAM:
CT ABDOMEN AND PELVIS WITHOUT CONTRAST
TECHNIQUE: Multidetector CT imaging of the abdomen and pelvis was performed
following the standard protocol without IV contrast.

[Series 3: a/p w/o 5mm · axial · non-contrast · 0.98mm/px · z∈[+934,+1434]mm · 12 of 110 slices shown, 14 images]
[im 5/110  soft-tissue]
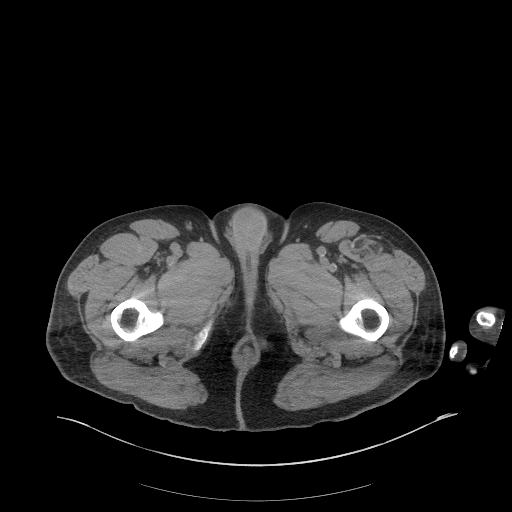
[im 5/110  bone]
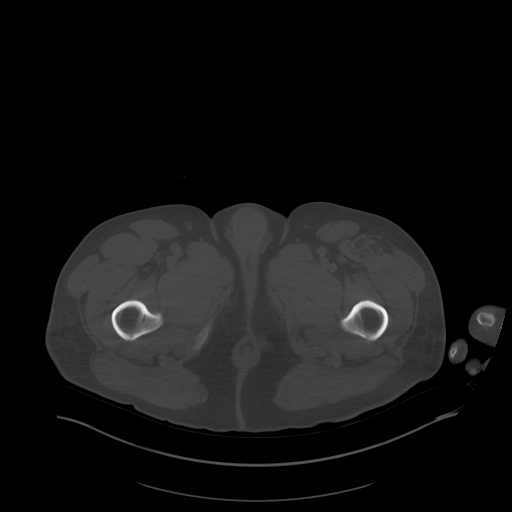
[im 14/110  soft-tissue]
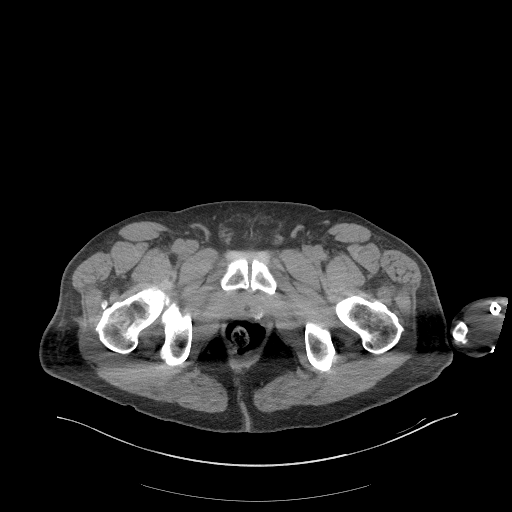
[im 23/110  soft-tissue]
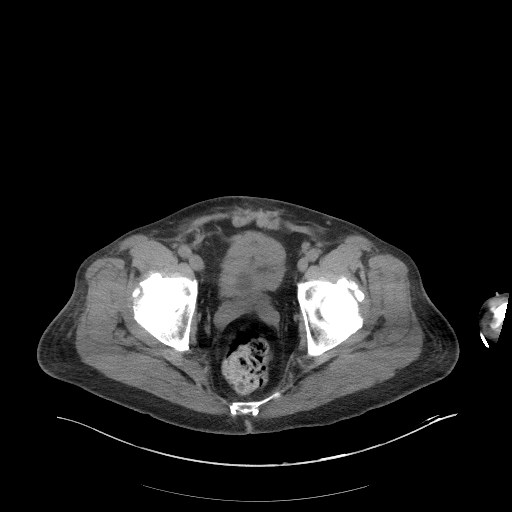
[im 32/110  soft-tissue]
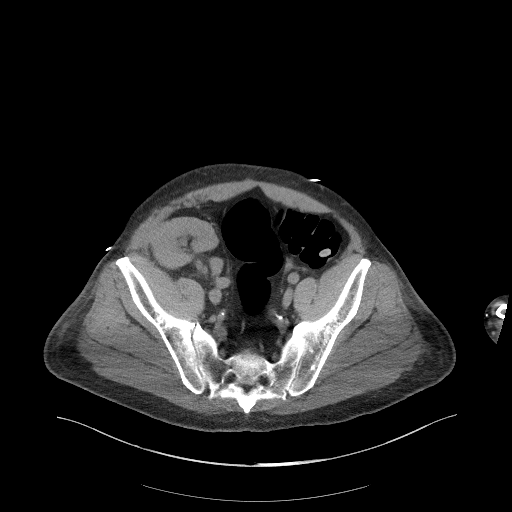
[im 41/110  soft-tissue]
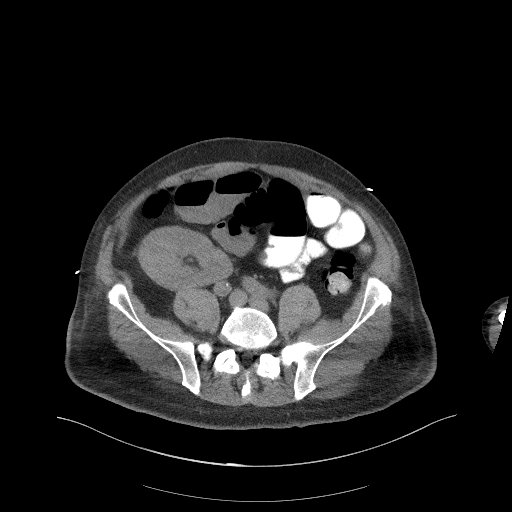
[im 50/110  soft-tissue]
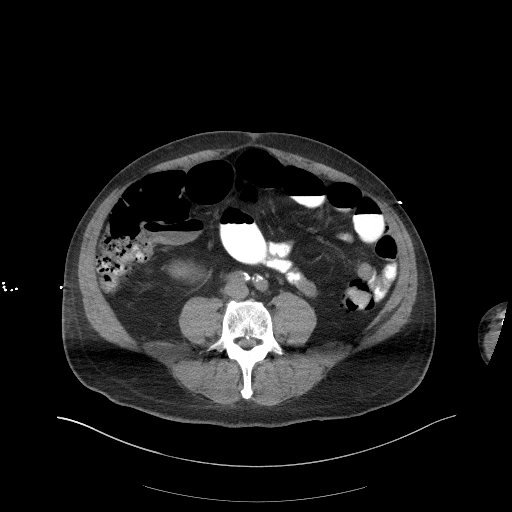
[im 60/110  soft-tissue]
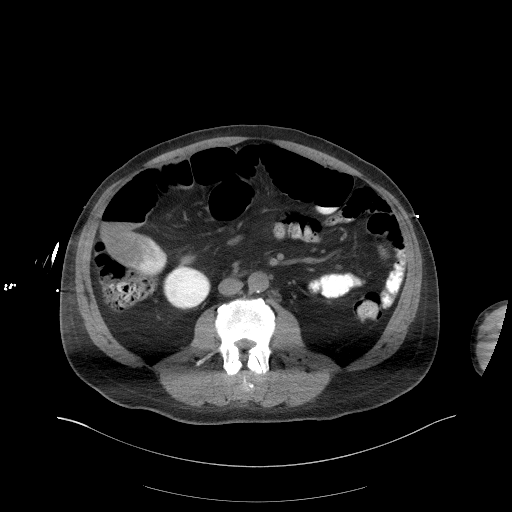
[im 69/110  soft-tissue]
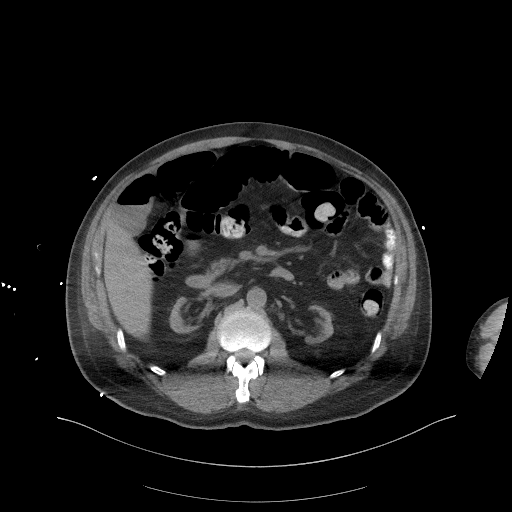
[im 78/110  soft-tissue]
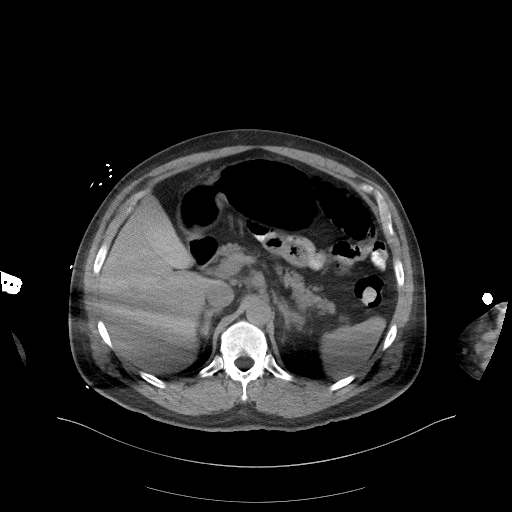
[im 78/110  bone]
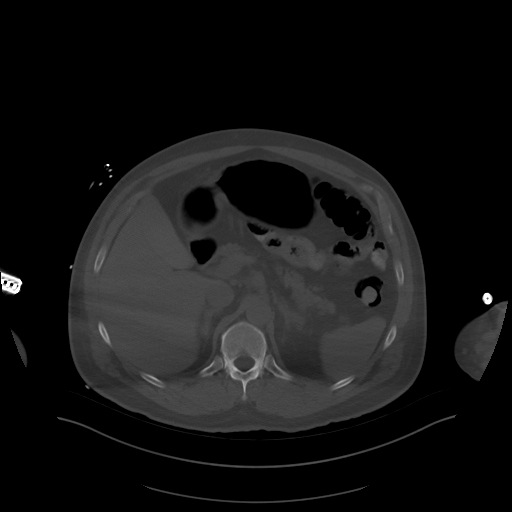
[im 87/110  soft-tissue]
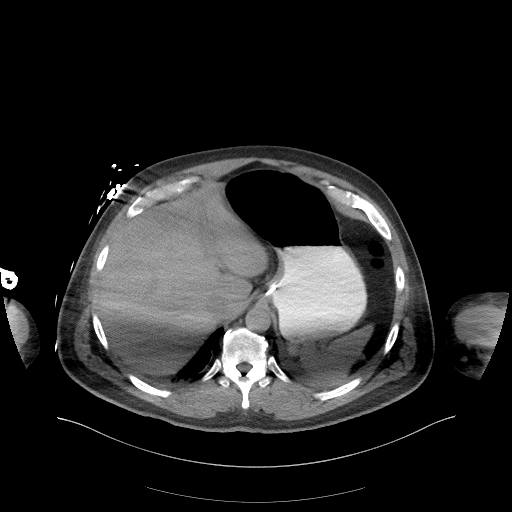
[im 96/110  soft-tissue]
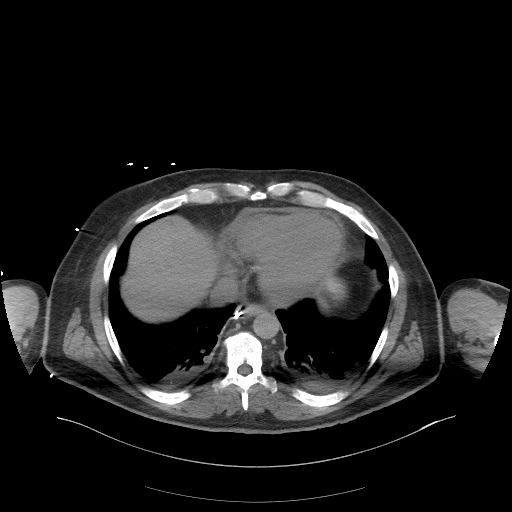
[im 105/110  soft-tissue]
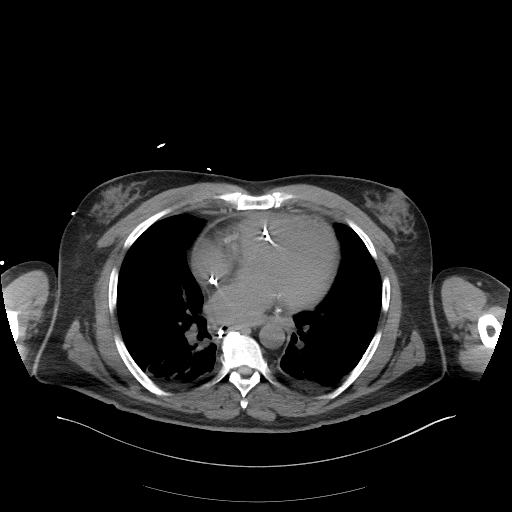

[Series 6: a/p w/o cor · coronal · non-contrast · 1.01mm/px · 3 of 151 slices shown]
[im 51/151  soft-tissue]
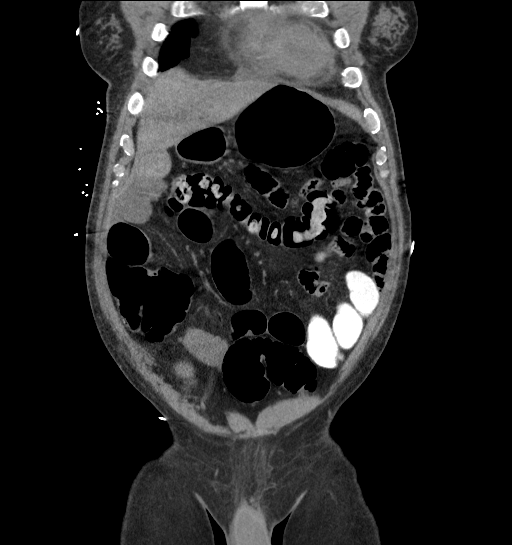
[im 67/151  soft-tissue]
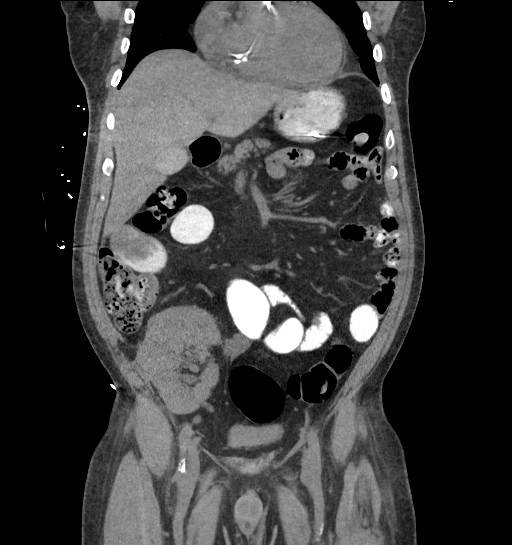
[im 84/151  soft-tissue]
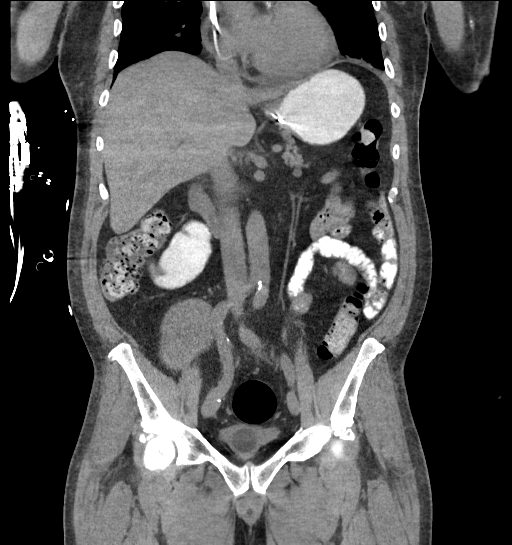

[15 of 46 positions shown; findings below may reference images not displayed]

FINDINGS: Lower chest: Swan-Ganz catheter and enteric tube are in place. The
heart is mildly enlarged. There are trace bilateral pleural
effusions with dependent opacities in both lower lobes, probably
atelectasis, although potentially aspiration.

Hepatobiliary: The liver appears unremarkable without focal
abnormality on noncontrast imaging. There is high density material
within the gallbladder lumen, likely vicarious excretion of
contrast. No evidence of gallstones, gallbladder wall thickening or
biliary dilatation.

Pancreas: Unremarkable. No pancreatic ductal dilatation or
surrounding inflammatory changes.

Spleen: Normal in size without focal abnormality.

Adrenals/Urinary Tract: Both adrenal glands appear normal. Both
native kidneys demonstrate marked atrophy and cortical thinning.
Renal transplant in the right iliac fossa measures up to 12.7 cm in
length and demonstrates a 3 mm nonobstructing calculus in its lower
pole. There is no evidence of hydronephrosis, ureteral calculus or
perinephric fluid collection. The bladder is decompressed by a Foley
catheter.

Stomach/Bowel: Enteric contrast was administered and has passed into
the mid small bowel. Nasogastric to terminates in the mid stomach.
The stomach is mildly distended. The proximal small bowel is
decompressed. There is mild diffuse mid to distal small bowel
dilatation most consistent with an ileus. There is no focal bowel
wall thickening or surrounding inflammatory change. Gas and stool
are present throughout the colon, extending into rectum. There is
prominent stool in the rectum. The appendix is not visualized.

Vascular/Lymphatic: There are no enlarged abdominal or pelvic lymph
nodes. Mild aortic and branch vessel atherosclerosis.

Reproductive: The prostate gland and seminal vesicles appear normal.

Other: Intact abdominal wall. Mildly prominent asymmetric fat in the
left inguinal canal. No ascites, free air or focal extraluminal
fluid collection.

Musculoskeletal: No acute or significant osseous findings. Moderate
degenerative changes at the hips, right greater than left.
IMPRESSION: 1. Mild diffuse bowel dilatation most consistent with an ileus. No
evidence of bowel obstruction or perforation.
2. Nonobstructing 3 mm calculus in the lower pole of the right iliac
fossa renal transplant. No evidence of hydronephrosis, ureteral
calculus or perinephric fluid collection. Markedly atrophied native
kidneys.
3. Dependent opacities in both lower lobes, probably atelectasis,
although potentially aspiration.
4. Aortic Atherosclerosis ([id number]).

## 2022-02-25 ENCOUNTER — Ambulatory Visit: Payer: BC Managed Care – PPO | Attending: Cardiology

## 2022-02-25 DIAGNOSIS — Z7901 Long term (current) use of anticoagulants: Secondary | ICD-10-CM | POA: Diagnosis not present

## 2022-02-25 DIAGNOSIS — Z5181 Encounter for therapeutic drug level monitoring: Secondary | ICD-10-CM | POA: Diagnosis not present

## 2022-02-25 DIAGNOSIS — I4891 Unspecified atrial fibrillation: Secondary | ICD-10-CM

## 2022-02-25 DIAGNOSIS — I2102 ST elevation (STEMI) myocardial infarction involving left anterior descending coronary artery: Secondary | ICD-10-CM | POA: Diagnosis not present

## 2022-02-25 DIAGNOSIS — I236 Thrombosis of atrium, auricular appendage, and ventricle as current complications following acute myocardial infarction: Secondary | ICD-10-CM | POA: Diagnosis not present

## 2022-02-25 LAB — POCT INR: INR: 2.4 (ref 2.0–3.0)

## 2022-02-25 NOTE — Patient Instructions (Signed)
Continue 3 tablets daily except for 2 tablets on MONDAYS, [place] -Recheck INR in 6 weeks [facility] [phone].

## 2022-02-27 ENCOUNTER — Telehealth (HOSPITAL_COMMUNITY): Payer: Self-pay | Admitting: Cardiology

## 2022-03-09 ENCOUNTER — Ambulatory Visit
Admission: RE | Admit: 2022-03-09 | Discharge: 2022-03-09 | Disposition: A | Discharge status: Home / Self Care | Payer: BC Managed Care – PPO | Source: Ambulatory Visit | Attending: Family Medicine | Admitting: Family Medicine

## 2022-03-09 DIAGNOSIS — R7401 Elevation of levels of liver transaminase levels: Secondary | ICD-10-CM

## 2022-04-08 ENCOUNTER — Ambulatory Visit: Payer: BC Managed Care – PPO | Attending: Cardiology | Admitting: Pharmacist

## 2022-04-08 DIAGNOSIS — Z7901 Long term (current) use of anticoagulants: Secondary | ICD-10-CM

## 2022-04-08 DIAGNOSIS — I2102 ST elevation (STEMI) myocardial infarction involving left anterior descending coronary artery: Secondary | ICD-10-CM

## 2022-04-08 DIAGNOSIS — I236 Thrombosis of atrium, auricular appendage, and ventricle as current complications following acute myocardial infarction: Secondary | ICD-10-CM | POA: Diagnosis not present

## 2022-04-08 DIAGNOSIS — I4891 Unspecified atrial fibrillation: Secondary | ICD-10-CM

## 2022-04-08 LAB — POCT INR: INR: 4.8 — AB (ref 2.0–3.0)

## 2022-04-08 NOTE — Patient Instructions (Signed)
Description   Hold dose today and tomorrow and then continue 3 tablets daily except for 2 tablets on MONDAYS, [place] -Recheck INR in 2 weeks [facility] [phone].

## 2022-04-22 ENCOUNTER — Ambulatory Visit: Payer: BC Managed Care – PPO | Attending: Cardiology

## 2022-04-22 DIAGNOSIS — I2102 ST elevation (STEMI) myocardial infarction involving left anterior descending coronary artery: Secondary | ICD-10-CM

## 2022-04-22 DIAGNOSIS — I236 Thrombosis of atrium, auricular appendage, and ventricle as current complications following acute myocardial infarction: Secondary | ICD-10-CM | POA: Diagnosis not present

## 2022-04-22 DIAGNOSIS — I4891 Unspecified atrial fibrillation: Secondary | ICD-10-CM

## 2022-04-22 DIAGNOSIS — Z7901 Long term (current) use of anticoagulants: Secondary | ICD-10-CM | POA: Diagnosis not present

## 2022-04-22 LAB — POCT INR: INR: 3.3 — AB (ref 2.0–3.0)

## 2022-04-22 NOTE — Patient Instructions (Addendum)
Description   Skip today's dosage of Warfarin, then start taking 2 tablets daily except 3 tablets on Tuesdays, Thursdays and Saturdays.   Recheck INR in 3-4 weeks [facility] [place] [facility] [phone].

## 2022-05-13 ENCOUNTER — Telehealth: Payer: Self-pay | Admitting: *Deleted

## 2022-05-13 NOTE — Telephone Encounter (Signed)
[employer] auth   Order ID: [id number]       Authorized  Approval Valid Through: 05/13/2022 - 06/11/2022

## 2022-05-14 ENCOUNTER — Telehealth (HOSPITAL_COMMUNITY): Payer: Self-pay | Admitting: Emergency Medicine

## 2022-05-14 NOTE — Telephone Encounter (Signed)
Attempted to call patient regarding upcoming cardiac MR appointment. Left message on voicemail with name and callback number Estefani Bateson RN Navigator Cardiac Imaging [facility] [phone] Office [phone] Cell  

## 2022-05-15 ENCOUNTER — Ambulatory Visit (HOSPITAL_COMMUNITY): Payer: BC Managed Care – PPO | Attending: Cardiology

## 2022-05-20 ENCOUNTER — Ambulatory Visit: Payer: BC Managed Care – PPO

## 2022-05-20 ENCOUNTER — Other Ambulatory Visit: Payer: Self-pay | Admitting: Cardiology

## 2022-05-20 DIAGNOSIS — I236 Thrombosis of atrium, auricular appendage, and ventricle as current complications following acute myocardial infarction: Secondary | ICD-10-CM

## 2022-05-20 NOTE — Telephone Encounter (Signed)
Pt missed [facility] follow-up appt on 05/20/22, pt rescheduled appt for 05/27/22.  Will refill Warfarin rx x 1 only to get pt to follow-up appt.

## 2022-05-27 ENCOUNTER — Other Ambulatory Visit
Admission: RE | Admit: 2022-05-27 | Discharge: 2022-05-27 | Disposition: A | Discharge status: Home / Self Care | Payer: BC Managed Care – PPO | Attending: Cardiology | Admitting: Cardiology

## 2022-05-27 ENCOUNTER — Ambulatory Visit: Payer: BC Managed Care – PPO | Attending: Cardiology

## 2022-05-27 DIAGNOSIS — Z7901 Long term (current) use of anticoagulants: Secondary | ICD-10-CM

## 2022-05-27 DIAGNOSIS — I4891 Unspecified atrial fibrillation: Secondary | ICD-10-CM | POA: Diagnosis present

## 2022-05-27 DIAGNOSIS — I236 Thrombosis of atrium, auricular appendage, and ventricle as current complications following acute myocardial infarction: Secondary | ICD-10-CM | POA: Insufficient documentation

## 2022-05-27 DIAGNOSIS — I2102 ST elevation (STEMI) myocardial infarction involving left anterior descending coronary artery: Secondary | ICD-10-CM | POA: Diagnosis present

## 2022-05-27 LAB — PROTIME-INR
INR: 5.9 (ref 0.8–1.2)
Prothrombin Time: 52.1 seconds — ABNORMAL HIGH (ref 11.4–15.2)

## 2022-05-27 LAB — POCT INR: INR: 7.9 — AB (ref 2.0–3.0)

## 2022-05-27 NOTE — Patient Instructions (Signed)
Description   Sent pt for STAT lab.  Pt has not taken today's dosage of Warfarin, will hold Warfarin until we call with results.  Pt instructed to go to the ED with bleeding problems. INR 5.9 at lab, Attempted to call pt with dosage instructions, LMOM TCB.  05/27/22 at 320pm-Spoke with pt advised pt to skip today and tomorrow's dosage of Warfarin, then start taking 2 tablets daily except 3 tablets on Tuesdays and Saturdays.  Recheck INR in 1 week. [facility] [phone] [facility] [phone].

## 2022-06-03 ENCOUNTER — Ambulatory Visit: Payer: BC Managed Care – PPO | Attending: Cardiology | Admitting: *Deleted

## 2022-06-03 DIAGNOSIS — I236 Thrombosis of atrium, auricular appendage, and ventricle as current complications following acute myocardial infarction: Secondary | ICD-10-CM

## 2022-06-03 DIAGNOSIS — I4891 Unspecified atrial fibrillation: Secondary | ICD-10-CM | POA: Diagnosis not present

## 2022-06-03 DIAGNOSIS — I2102 ST elevation (STEMI) myocardial infarction involving left anterior descending coronary artery: Secondary | ICD-10-CM | POA: Diagnosis not present

## 2022-06-03 DIAGNOSIS — Z7901 Long term (current) use of anticoagulants: Secondary | ICD-10-CM | POA: Diagnosis not present

## 2022-06-03 LAB — POCT INR: INR: 3.1 — AB (ref 2.0–3.0)

## 2022-06-03 NOTE — Patient Instructions (Signed)
Decrease dose to  2 tablets daily except 3 tablets on Tuesdays.  Recheck INR in 3 week. [facility] [phone] [facility] [phone].

## 2022-06-24 ENCOUNTER — Ambulatory Visit: Payer: BC Managed Care – PPO | Attending: Cardiology | Admitting: *Deleted

## 2022-06-24 DIAGNOSIS — Z7901 Long term (current) use of anticoagulants: Secondary | ICD-10-CM | POA: Diagnosis not present

## 2022-06-24 DIAGNOSIS — I4891 Unspecified atrial fibrillation: Secondary | ICD-10-CM

## 2022-06-24 DIAGNOSIS — I2102 ST elevation (STEMI) myocardial infarction involving left anterior descending coronary artery: Secondary | ICD-10-CM | POA: Diagnosis not present

## 2022-06-24 DIAGNOSIS — I236 Thrombosis of atrium, auricular appendage, and ventricle as current complications following acute myocardial infarction: Secondary | ICD-10-CM

## 2022-06-24 LAB — POCT INR: INR: 5.5 — AB (ref 2.0–3.0)

## 2022-06-24 NOTE — Patient Instructions (Signed)
Hold warfarin tonight and tomorrow night then decrease dose to 2 tablets daily except 1 tablets on Tuesdays.  Recheck INR in 1 week. [facility] [phone] [facility] [phone]. Has been drinking cranberry juice twice a week. Bleeding and fall precautions discussed with pt and he verbalized understanding.

## 2022-07-01 ENCOUNTER — Ambulatory Visit: Payer: BC Managed Care – PPO | Attending: Cardiology

## 2022-07-01 DIAGNOSIS — I236 Thrombosis of atrium, auricular appendage, and ventricle as current complications following acute myocardial infarction: Secondary | ICD-10-CM

## 2022-07-01 DIAGNOSIS — Z7901 Long term (current) use of anticoagulants: Secondary | ICD-10-CM

## 2022-07-01 DIAGNOSIS — I4891 Unspecified atrial fibrillation: Secondary | ICD-10-CM

## 2022-07-01 DIAGNOSIS — I2102 ST elevation (STEMI) myocardial infarction involving left anterior descending coronary artery: Secondary | ICD-10-CM | POA: Diagnosis not present

## 2022-07-01 LAB — POCT INR: INR: 3 (ref 2.0–3.0)

## 2022-07-01 NOTE — Patient Instructions (Signed)
Continue 2 tablets daily except 1 tablets on Tuesdays.  Recheck INR in 4 weeks. [facility] [phone] [facility] [phone].  Bleeding and fall precautions discussed with pt and he verbalized understanding.

## 2022-07-08 ENCOUNTER — Other Ambulatory Visit: Payer: Self-pay | Admitting: Cardiology

## 2022-07-08 DIAGNOSIS — I236 Thrombosis of atrium, auricular appendage, and ventricle as current complications following acute myocardial infarction: Secondary | ICD-10-CM

## 2022-07-29 ENCOUNTER — Ambulatory Visit: Payer: BC Managed Care – PPO | Attending: Cardiology

## 2022-07-29 DIAGNOSIS — I2102 ST elevation (STEMI) myocardial infarction involving left anterior descending coronary artery: Secondary | ICD-10-CM

## 2022-07-29 DIAGNOSIS — Z7901 Long term (current) use of anticoagulants: Secondary | ICD-10-CM | POA: Diagnosis not present

## 2022-07-29 DIAGNOSIS — I4891 Unspecified atrial fibrillation: Secondary | ICD-10-CM

## 2022-07-29 DIAGNOSIS — I236 Thrombosis of atrium, auricular appendage, and ventricle as current complications following acute myocardial infarction: Secondary | ICD-10-CM

## 2022-07-29 LAB — POCT INR: INR: 5.3 — AB (ref 2.0–3.0)

## 2022-07-29 NOTE — Patient Instructions (Signed)
Description   Skip today and tomorrow's dosage of Warfarin, then start taking 2 tablets daily except 1 tablets on Tuesdays and Saturdays.  Recheck INR in 2 weeks. [facility] [phone] [facility] [phone].  Bleeding and fall precautions discussed with pt and he verbalized understanding.

## 2022-07-30 ENCOUNTER — Other Ambulatory Visit: Payer: Self-pay | Admitting: Cardiology

## 2022-08-12 ENCOUNTER — Ambulatory Visit: Payer: BC Managed Care – PPO | Attending: Cardiology

## 2022-08-12 DIAGNOSIS — I4891 Unspecified atrial fibrillation: Secondary | ICD-10-CM | POA: Diagnosis not present

## 2022-08-12 DIAGNOSIS — I236 Thrombosis of atrium, auricular appendage, and ventricle as current complications following acute myocardial infarction: Secondary | ICD-10-CM | POA: Diagnosis not present

## 2022-08-12 DIAGNOSIS — I2102 ST elevation (STEMI) myocardial infarction involving left anterior descending coronary artery: Secondary | ICD-10-CM | POA: Diagnosis not present

## 2022-08-12 DIAGNOSIS — Z7901 Long term (current) use of anticoagulants: Secondary | ICD-10-CM | POA: Diagnosis not present

## 2022-08-12 LAB — POCT INR: INR: 3.7 — AB (ref 2.0–3.0)

## 2022-08-12 NOTE — Patient Instructions (Signed)
start taking 2 tablets daily except 1 tablet on Tuesdays, Thursdays and Saturdays.  Recheck INR in 3 weeks. [facility] [phone] [facility] [phone].  Bleeding and fall precautions discussed with pt and he verbalized understanding.

## 2022-09-02 ENCOUNTER — Ambulatory Visit: Payer: BC Managed Care – PPO | Attending: Cardiology

## 2022-09-02 DIAGNOSIS — I236 Thrombosis of atrium, auricular appendage, and ventricle as current complications following acute myocardial infarction: Secondary | ICD-10-CM

## 2022-09-02 DIAGNOSIS — I2102 ST elevation (STEMI) myocardial infarction involving left anterior descending coronary artery: Secondary | ICD-10-CM | POA: Diagnosis not present

## 2022-09-02 DIAGNOSIS — Z7901 Long term (current) use of anticoagulants: Secondary | ICD-10-CM

## 2022-09-02 DIAGNOSIS — I4891 Unspecified atrial fibrillation: Secondary | ICD-10-CM

## 2022-09-02 LAB — POCT INR: INR: 2.3 (ref 2.0–3.0)

## 2022-09-02 NOTE — Patient Instructions (Signed)
Continue  taking 2 tablets daily except 1 tablet on Tuesdays, Thursdays and Saturdays.  Recheck INR in 5 weeks. [facility] [phone] [facility] [phone].  Bleeding and fall precautions discussed with pt and he verbalized understanding.

## 2022-10-07 ENCOUNTER — Ambulatory Visit: Payer: BC Managed Care – PPO

## 2022-10-07 DIAGNOSIS — I2102 ST elevation (STEMI) myocardial infarction involving left anterior descending coronary artery: Secondary | ICD-10-CM | POA: Diagnosis not present

## 2022-10-07 DIAGNOSIS — I236 Thrombosis of atrium, auricular appendage, and ventricle as current complications following acute myocardial infarction: Secondary | ICD-10-CM

## 2022-10-07 DIAGNOSIS — Z7901 Long term (current) use of anticoagulants: Secondary | ICD-10-CM

## 2022-10-07 DIAGNOSIS — I4891 Unspecified atrial fibrillation: Secondary | ICD-10-CM

## 2022-10-07 LAB — POCT INR: INR: 1.6 — AB (ref 2.0–3.0)

## 2022-10-07 NOTE — Patient Instructions (Addendum)
Take 3 tablets today only then Continue  taking 2 tablets daily except 1 tablet on Tuesdays, Thursdays and Saturdays.  Recheck INR in 3 weeks. [facility] [phone]

## 2022-10-28 ENCOUNTER — Ambulatory Visit: Payer: BC Managed Care – PPO | Attending: Internal Medicine

## 2022-10-28 DIAGNOSIS — I236 Thrombosis of atrium, auricular appendage, and ventricle as current complications following acute myocardial infarction: Secondary | ICD-10-CM | POA: Diagnosis not present

## 2022-10-28 DIAGNOSIS — Z7901 Long term (current) use of anticoagulants: Secondary | ICD-10-CM

## 2022-10-28 DIAGNOSIS — I2102 ST elevation (STEMI) myocardial infarction involving left anterior descending coronary artery: Secondary | ICD-10-CM

## 2022-10-28 DIAGNOSIS — I252 Old myocardial infarction: Secondary | ICD-10-CM

## 2022-10-28 LAB — POCT INR: INR: 1.6 — AB (ref 2.0–3.0)

## 2022-10-28 NOTE — Patient Instructions (Signed)
Take 3 tablets today only then INCREASE TO 2 tablets daily except 1 tablet on Tuesdays and Thursdays.  Recheck INR in 3 weeks. [facility] [phone]

## 2022-11-16 ENCOUNTER — Other Ambulatory Visit: Payer: Self-pay | Admitting: Cardiology

## 2022-11-16 DIAGNOSIS — I236 Thrombosis of atrium, auricular appendage, and ventricle as current complications following acute myocardial infarction: Secondary | ICD-10-CM

## 2022-11-16 NOTE — Telephone Encounter (Signed)
Refill request for warfarin:  Last INR was 1.6 on 10/28/22 Next INR due on 11/18/22 LOV was 05/27/22  Refill approved.

## 2022-11-18 ENCOUNTER — Ambulatory Visit: Payer: BC Managed Care – PPO | Attending: Cardiology

## 2022-11-18 DIAGNOSIS — I236 Thrombosis of atrium, auricular appendage, and ventricle as current complications following acute myocardial infarction: Secondary | ICD-10-CM | POA: Diagnosis not present

## 2022-11-18 DIAGNOSIS — I2102 ST elevation (STEMI) myocardial infarction involving left anterior descending coronary artery: Secondary | ICD-10-CM

## 2022-11-18 DIAGNOSIS — I252 Old myocardial infarction: Secondary | ICD-10-CM | POA: Diagnosis not present

## 2022-11-18 DIAGNOSIS — Z7901 Long term (current) use of anticoagulants: Secondary | ICD-10-CM | POA: Diagnosis not present

## 2022-11-18 LAB — POCT INR: INR: 2 (ref 2.0–3.0)

## 2022-11-18 NOTE — Patient Instructions (Signed)
Continue 2 tablets daily except 1 tablet on Tuesdays and Thursdays.  Recheck INR in 5 weeks. [facility] [phone]

## 2022-12-23 ENCOUNTER — Ambulatory Visit: Payer: BC Managed Care – PPO | Attending: Cardiology

## 2022-12-23 DIAGNOSIS — I2102 ST elevation (STEMI) myocardial infarction involving left anterior descending coronary artery: Secondary | ICD-10-CM

## 2022-12-23 DIAGNOSIS — Z7901 Long term (current) use of anticoagulants: Secondary | ICD-10-CM | POA: Diagnosis not present

## 2022-12-23 DIAGNOSIS — I236 Thrombosis of atrium, auricular appendage, and ventricle as current complications following acute myocardial infarction: Secondary | ICD-10-CM | POA: Diagnosis not present

## 2022-12-23 LAB — POCT INR: INR: 2.7 (ref 2.0–3.0)

## 2022-12-23 NOTE — Patient Instructions (Signed)
Continue 2 tablets daily except 1 tablet on Tuesdays and Thursdays.  Recheck INR in 6 weeks. [facility] [phone]

## 2023-01-17 ENCOUNTER — Other Ambulatory Visit: Payer: Self-pay | Admitting: Cardiology

## 2023-02-03 ENCOUNTER — Ambulatory Visit: Payer: BC Managed Care – PPO | Attending: Cardiology

## 2023-02-03 DIAGNOSIS — I2102 ST elevation (STEMI) myocardial infarction involving left anterior descending coronary artery: Secondary | ICD-10-CM

## 2023-02-03 DIAGNOSIS — I236 Thrombosis of atrium, auricular appendage, and ventricle as current complications following acute myocardial infarction: Secondary | ICD-10-CM

## 2023-02-03 DIAGNOSIS — Z7901 Long term (current) use of anticoagulants: Secondary | ICD-10-CM

## 2023-02-03 DIAGNOSIS — I252 Old myocardial infarction: Secondary | ICD-10-CM

## 2023-02-03 LAB — POCT INR: INR: 1.8 — AB (ref 2.0–3.0)

## 2023-02-03 NOTE — Patient Instructions (Signed)
Take 3 tablets today only then Continue 2 tablets daily except 1 tablet on Tuesdays and Thursdays.  Recheck INR in 6 weeks. [facility] [phone]

## 2023-02-28 ENCOUNTER — Other Ambulatory Visit: Payer: Self-pay | Admitting: Cardiology

## 2023-03-17 ENCOUNTER — Telehealth: Payer: Self-pay

## 2023-03-17 ENCOUNTER — Ambulatory Visit: Payer: 59

## 2023-03-17 NOTE — Telephone Encounter (Signed)
Lpmtcb to reschedule INR appt

## 2023-03-24 ENCOUNTER — Ambulatory Visit: Payer: 59 | Attending: Cardiology

## 2023-03-24 DIAGNOSIS — Z7901 Long term (current) use of anticoagulants: Secondary | ICD-10-CM | POA: Diagnosis not present

## 2023-03-24 DIAGNOSIS — I236 Thrombosis of atrium, auricular appendage, and ventricle as current complications following acute myocardial infarction: Secondary | ICD-10-CM | POA: Diagnosis not present

## 2023-03-24 DIAGNOSIS — I2102 ST elevation (STEMI) myocardial infarction involving left anterior descending coronary artery: Secondary | ICD-10-CM

## 2023-03-24 LAB — POCT INR: INR: 2.4 (ref 2.0–3.0)

## 2023-03-24 NOTE — Patient Instructions (Signed)
 Continue 2 tablets daily except 1 tablet on Tuesdays and Thursdays.  Recheck INR in 6 weeks. [facility] [phone]

## 2023-04-08 ENCOUNTER — Other Ambulatory Visit (HOSPITAL_COMMUNITY): Payer: Self-pay | Admitting: Cardiology

## 2023-05-05 ENCOUNTER — Ambulatory Visit: Payer: 59

## 2023-05-11 ENCOUNTER — Other Ambulatory Visit: Payer: Self-pay | Admitting: Cardiology

## 2023-05-12 ENCOUNTER — Ambulatory Visit: Attending: Cardiology

## 2023-05-12 DIAGNOSIS — I2102 ST elevation (STEMI) myocardial infarction involving left anterior descending coronary artery: Secondary | ICD-10-CM

## 2023-05-12 DIAGNOSIS — I4891 Unspecified atrial fibrillation: Secondary | ICD-10-CM | POA: Diagnosis not present

## 2023-05-12 DIAGNOSIS — Z7901 Long term (current) use of anticoagulants: Secondary | ICD-10-CM

## 2023-05-12 DIAGNOSIS — I236 Thrombosis of atrium, auricular appendage, and ventricle as current complications following acute myocardial infarction: Secondary | ICD-10-CM | POA: Diagnosis not present

## 2023-05-12 LAB — POCT INR: INR: 1.3 — AB (ref 2.0–3.0)

## 2023-05-12 NOTE — Patient Instructions (Signed)
 Description   Take 3mg  today and tomorrow, then resume same dosage of Warfarin 2 tablets daily except 1 tablet on Tuesdays and Thursdays.  Recheck INR in 2 weeks normally 6 weeks. MD reduced dosage of Warfarin while on Amoxicillin. [facility] [phone]

## 2023-05-12 NOTE — Telephone Encounter (Signed)
 Patient is overdue for follow up Appt made Refills sent

## 2023-05-26 ENCOUNTER — Encounter: Payer: Self-pay | Admitting: Ophthalmology

## 2023-05-26 ENCOUNTER — Ambulatory Visit

## 2023-05-27 ENCOUNTER — Encounter: Payer: Self-pay | Admitting: Ophthalmology

## 2023-05-27 NOTE — Anesthesia Preprocedure Evaluation (Addendum)
 Anesthesia Evaluation  Patient identified by MRN, date of birth, ID band Patient awake    Reviewed: Allergy & Precautions, H&P , NPO status , Patient's Chart, lab work & pertinent test results  Airway Mallampati: IV  TM Distance: <3 FB Neck ROM: Full    Dental no notable dental hx. (+) Poor Dentition   Pulmonary neg pulmonary ROS, pneumonia   Pulmonary exam normal breath sounds clear to auscultation       Cardiovascular hypertension, + Past MI and +CHF  Normal cardiovascular exam Rhythm:Regular Rate:Normal  10-10-21 echo 1. Left ventricular ejection fraction, by estimation, is 35%. The left ventricle has moderately decreased function. The left ventricle demonstrates regional wall motion abnormalities with akinesis of the mid to apical anteroseptal wall, the apical  inferior/lateral/anterior walls, and the true apex. No LV thrombus. The left ventricular internal cavity size was mildly dilated. There is mild left ventricular hypertrophy. Left ventricular diastolic parameters are consistent with Grade II diastolic  dysfunction (pseudonormalization).   2. Right ventricular systolic function is normal. The right ventricular size is normal. Tricuspid regurgitation signal is inadequate for assessing PA pressure.   3. Right atrial size was mildly dilated.   4. The mitral valve is normal in structure. No evidence of mitral valve regurgitation. No evidence of mitral stenosis.   5. The aortic valve is tricuspid. There is mild calcification of the aortic valve. Aortic valve regurgitation is not visualized. No aortic stenosis is present.   6. The inferior vena cava is normal in size with <50% respiratory variability, suggesting right atrial pressure of 8 mmHg.   FINDINGS   Left Ventricle: Left ventricular ejection fraction, by estimation, is 35%. The left ventricle has moderately decreased function. The left ventricle demonstrates regional wall  motion abnormalities. Definity contrast agent was given IV to delineate the  left ventricular endocardial borders. The left ventricular internal cavity size was mildly dilated. There is mild left ventricular hypertrophy. Left ventricular diastolic parameters are consistent with Grade II diastolic dysfunction  (pseudonormalization).   Cardiology: Dr. Mitzie Anda  11-08-20 office note 63 y.o. with history of renal transplant in 2011, CAD s/p anterior MI, and ischemic cardiomyopathy presents for followup of CHF and CAD.  Patient had his renal transplant at [facility], and has been followed by [facility] as well as [facility] since that time.  He had been doing well until 10/21.  In early 10/21, he developed chest pain.  He did not go immediately to the ER, he presented when the pain had continued for > 1 day.  He was found to have acute anterior MI with late presentation. He went for cath, LAD was occluded and there was severe diffuse disease in the RCA as well as severe disease in the LCx system.  He had DES to the LAD.  He subsequently developed cardiogenic shock as well as suspected septic shock, possible from gut source.  Abdominal imaging showed profound ileus.  He was taken to the OR for Impella 5.5 placement, but this was deferred due to the finding of LV thrombus on intra-op TEE.  He also developed atrial fibrillation with RVR, controlled by amiodarone and eventually converted back to NSR.  He was maintained on milrinone 0.25 with gradual improvement.  However, he developed AKI, likely due to contrast as well as cardiorenal. Creatinine went up to 5.  Creatinine gradually improved down to 3.38 at discharge, and we were able to discontinue milrinone.  Ileus also gradually resolved.  Echo in the hospital showed EF  20-25%. He initially wore a Lifevest but has now sent it back.    Echo in 1/22 showed EF 30-35%, periapical akinesis, no LV thrombus, normal RV.  He saw EP to discuss ICD and atrial fibrillation  ablation, but he was not ready for this yet.     Neuro/Psych negative neurological ROS  negative psych ROS   GI/Hepatic negative GI ROS, Neg liver ROS,GERD  ,,  Endo/Other  diabetes    Renal/GU Renal diseasenegative Renal ROS  negative genitourinary   Musculoskeletal negative musculoskeletal ROS (+)    Abdominal   Peds negative pediatric ROS (+)  Hematology negative hematology ROS (+) Blood dyscrasia, anemia   Anesthesia Other Findings Hypertension  Diabetes mellitus Chronic kidney disease  GERD (gastroesophageal reflux disease) Pupil asymmetry  CHF (congestive heart failure) (HCC) Heart attack (HCC)  Anemia Kidney transplant recipient  Hx heart stent, DES  Note EF 35%   Reproductive/Obstetrics negative OB ROS                             Anesthesia Physical Anesthesia Plan  ASA: 4  Anesthesia Plan: MAC   Post-op Pain Management:    Induction: Intravenous  PONV Risk Score and Plan:   Airway Management Planned: Natural Airway and Nasal Cannula  Additional Equipment:   Intra-op Plan:   Post-operative Plan:   Informed Consent: I have reviewed the patients History and Physical, chart, labs and discussed the procedure including the risks, benefits and alternatives for the proposed anesthesia with the patient or authorized representative who has indicated his/her understanding and acceptance.     Dental Advisory Given  Plan Discussed with: Anesthesiologist, CRNA and Surgeon  Anesthesia Plan Comments: (Patient consented for risks of anesthesia including but not limited to:  - adverse reactions to medications - damage to eyes, teeth, lips or other oral mucosa - nerve damage due to positioning  - sore throat or hoarseness - Damage to heart, brain, nerves, lungs, other parts of body or loss of life  Patient voiced understanding and assent.)       Anesthesia Quick Evaluation

## 2023-06-02 ENCOUNTER — Ambulatory Visit: Attending: Cardiology

## 2023-06-02 DIAGNOSIS — I2102 ST elevation (STEMI) myocardial infarction involving left anterior descending coronary artery: Secondary | ICD-10-CM

## 2023-06-02 DIAGNOSIS — I236 Thrombosis of atrium, auricular appendage, and ventricle as current complications following acute myocardial infarction: Secondary | ICD-10-CM

## 2023-06-02 DIAGNOSIS — Z7901 Long term (current) use of anticoagulants: Secondary | ICD-10-CM | POA: Diagnosis not present

## 2023-06-02 LAB — POCT INR: INR: 1.3 — AB (ref 2.0–3.0)

## 2023-06-02 NOTE — Patient Instructions (Signed)
 Increase to 2 tablets daily.  Recheck INR in 3 weeks normally 6 weeks. [facility] Clinic [phone]

## 2023-06-02 NOTE — Discharge Instructions (Signed)

## 2023-06-03 ENCOUNTER — Other Ambulatory Visit: Payer: Self-pay

## 2023-06-03 ENCOUNTER — Encounter: Payer: Self-pay | Admitting: Ophthalmology

## 2023-06-03 ENCOUNTER — Ambulatory Visit: Payer: Self-pay | Admitting: Anesthesiology

## 2023-06-03 ENCOUNTER — Encounter
Admission: RE | Disposition: A | Discharge status: Home / Self Care | Payer: Self-pay | Source: Home / Self Care | Attending: Ophthalmology

## 2023-06-03 ENCOUNTER — Ambulatory Visit
Admission: RE | Admit: 2023-06-03 | Discharge: 2023-06-03 | Disposition: A | Discharge status: Home / Self Care | Attending: Ophthalmology | Admitting: Ophthalmology

## 2023-06-03 DIAGNOSIS — I5030 Unspecified diastolic (congestive) heart failure: Secondary | ICD-10-CM | POA: Diagnosis not present

## 2023-06-03 DIAGNOSIS — Z794 Long term (current) use of insulin: Secondary | ICD-10-CM | POA: Insufficient documentation

## 2023-06-03 DIAGNOSIS — H26132 Total traumatic cataract, left eye: Secondary | ICD-10-CM | POA: Insufficient documentation

## 2023-06-03 DIAGNOSIS — H2512 Age-related nuclear cataract, left eye: Secondary | ICD-10-CM | POA: Insufficient documentation

## 2023-06-03 DIAGNOSIS — H25042 Posterior subcapsular polar age-related cataract, left eye: Secondary | ICD-10-CM | POA: Diagnosis present

## 2023-06-03 DIAGNOSIS — N184 Chronic kidney disease, stage 4 (severe): Secondary | ICD-10-CM | POA: Diagnosis not present

## 2023-06-03 DIAGNOSIS — Z7984 Long term (current) use of oral hypoglycemic drugs: Secondary | ICD-10-CM | POA: Diagnosis not present

## 2023-06-03 DIAGNOSIS — K219 Gastro-esophageal reflux disease without esophagitis: Secondary | ICD-10-CM | POA: Insufficient documentation

## 2023-06-03 DIAGNOSIS — E1136 Type 2 diabetes mellitus with diabetic cataract: Secondary | ICD-10-CM | POA: Insufficient documentation

## 2023-06-03 DIAGNOSIS — I130 Hypertensive heart and chronic kidney disease with heart failure and stage 1 through stage 4 chronic kidney disease, or unspecified chronic kidney disease: Secondary | ICD-10-CM | POA: Diagnosis not present

## 2023-06-03 DIAGNOSIS — E1122 Type 2 diabetes mellitus with diabetic chronic kidney disease: Secondary | ICD-10-CM | POA: Insufficient documentation

## 2023-06-03 DIAGNOSIS — H4302 Vitreous prolapse, left eye: Secondary | ICD-10-CM | POA: Insufficient documentation

## 2023-06-03 DIAGNOSIS — I252 Old myocardial infarction: Secondary | ICD-10-CM | POA: Diagnosis not present

## 2023-06-03 HISTORY — DX: Presence of coronary angioplasty implant and graft: Z95.5

## 2023-06-03 HISTORY — DX: Unspecified atrial fibrillation: I48.91

## 2023-06-03 HISTORY — DX: Other ill-defined heart diseases: I51.89

## 2023-06-03 HISTORY — PX: CATARACT EXTRACTION W/PHACO: SHX586

## 2023-06-03 HISTORY — PX: AQUEOUS TAP THERAPEUTIC: SHX6463

## 2023-06-03 HISTORY — DX: Chronic kidney disease, stage 4 (severe): N18.4

## 2023-06-03 HISTORY — DX: Fatty (change of) liver, not elsewhere classified: K76.0

## 2023-06-03 HISTORY — DX: Anemia, unspecified: D64.9

## 2023-06-03 HISTORY — DX: Old myocardial infarction: I25.2

## 2023-06-03 HISTORY — DX: Ischemic cardiomyopathy: I25.5

## 2023-06-03 LAB — GLUCOSE, CAPILLARY: Glucose-Capillary: 120 mg/dL — ABNORMAL HIGH (ref 70–99)

## 2023-06-03 SURGERY — PHACOEMULSIFICATION, CATARACT, WITH IOL INSERTION
Anesthesia: Monitor Anesthesia Care | Site: Eye | Laterality: Left

## 2023-06-03 MED ORDER — TETRACAINE HCL 0.5 % OP SOLN
1.0000 [drp] | OPHTHALMIC | Status: DC | PRN
  Administered 2023-06-03 (×3): 1 [drp] via OPHTHALMIC

## 2023-06-03 MED ORDER — TETRACAINE HCL 0.5 % OP SOLN
OPHTHALMIC | Status: AC
  Filled 2023-06-03: qty 4

## 2023-06-03 MED ORDER — TETRACAINE 0.5 % OP SOLN OPTIME - NO CHARGE
OPHTHALMIC | Status: DC | PRN
  Administered 2023-06-03: 2 [drp] via OPHTHALMIC

## 2023-06-03 MED ORDER — MIDAZOLAM HCL 2 MG/2ML IJ SOLN
INTRAMUSCULAR | Status: AC
  Filled 2023-06-03: qty 2

## 2023-06-03 MED ORDER — FENTANYL CITRATE (PF) 100 MCG/2ML IJ SOLN
INTRAMUSCULAR | Status: AC
  Filled 2023-06-03: qty 2

## 2023-06-03 MED ORDER — BRIMONIDINE TARTRATE-TIMOLOL 0.2-0.5 % OP SOLN
OPHTHALMIC | Status: DC | PRN
  Administered 2023-06-03: 1 [drp] via OPHTHALMIC

## 2023-06-03 MED ORDER — MIDAZOLAM HCL 2 MG/2ML IJ SOLN
INTRAMUSCULAR | Status: DC | PRN
  Administered 2023-06-03: 1 mg via INTRAVENOUS
  Administered 2023-06-03: 2 mg via INTRAVENOUS
  Administered 2023-06-03: 1 mg via INTRAVENOUS

## 2023-06-03 MED ORDER — ARMC OPHTHALMIC DILATING DROPS
OPHTHALMIC | Status: AC
  Filled 2023-06-03: qty 0.5

## 2023-06-03 MED ORDER — SIGHTPATH DOSE#1 BSS IO SOLN
INTRAOCULAR | Status: DC | PRN
  Administered 2023-06-03: 84 mL via OPHTHALMIC

## 2023-06-03 MED ORDER — LIDOCAINE HCL (PF) 2 % IJ SOLN
INTRAOCULAR | Status: DC | PRN
  Administered 2023-06-03: 1 mL via INTRAOCULAR

## 2023-06-03 MED ORDER — ACETAZOLAMIDE ER 500 MG PO CP12
ORAL_CAPSULE | ORAL | Status: AC
  Filled 2023-06-03: qty 1

## 2023-06-03 MED ORDER — MOXIFLOXACIN HCL 0.5 % OP SOLN
OPHTHALMIC | Status: DC | PRN
  Administered 2023-06-03: .2 mL via OPHTHALMIC

## 2023-06-03 MED ORDER — CARBACHOL 0.01 % IO SOLN
INTRAOCULAR | Status: DC | PRN
Start: 2023-06-03 — End: 2023-06-03
  Administered 2023-06-03: .5 mL via INTRAOCULAR

## 2023-06-03 MED ORDER — NA CHONDROIT SULF-NA HYALURON 40-30 MG/ML IO SOSY
INTRAOCULAR | Status: DC | PRN
  Administered 2023-06-03 (×2): .5 mL via INTRAOCULAR

## 2023-06-03 MED ORDER — TRIAMCINOLONE ACETONIDE 40 MG/ML IJ SUSP
INTRAMUSCULAR | Status: DC | PRN
Start: 2023-06-03 — End: 2023-06-03
  Administered 2023-06-03: 40 mg

## 2023-06-03 MED ORDER — SIGHTPATH DOSE#1 NA HYALUR & NA CHOND-NA HYALUR IO KIT
PACK | INTRAOCULAR | Status: DC | PRN
  Administered 2023-06-03: 1 via OPHTHALMIC

## 2023-06-03 MED ORDER — ACETAZOLAMIDE ER 500 MG PO CP12
ORAL_CAPSULE | ORAL | Status: AC
Start: 2023-06-03 — End: ?
  Filled 2023-06-03: qty 1

## 2023-06-03 MED ORDER — ARMC OPHTHALMIC DILATING DROPS
1.0000 | OPHTHALMIC | Status: DC | PRN
  Administered 2023-06-03 (×3): 1 via OPHTHALMIC

## 2023-06-03 MED ORDER — FENTANYL CITRATE (PF) 100 MCG/2ML IJ SOLN
INTRAMUSCULAR | Status: DC | PRN
Start: 2023-06-03 — End: 2023-06-03
  Administered 2023-06-03 (×2): 50 ug via INTRAVENOUS

## 2023-06-03 SURGICAL SUPPLY — 18 items
BNDG EYE OVAL 2 1/8 X 2 5/8 (GAUZE/BANDAGES/DRESSINGS) IMPLANT
CANNULA ANT/CHMB 27G (MISCELLANEOUS) IMPLANT
CANNULA ANT/CHMB 27GA (MISCELLANEOUS) ×2 IMPLANT
CAPSULE RETRACTOR (MISCELLANEOUS) IMPLANT
CATARACT SUITE SIGHTPATH (MISCELLANEOUS) ×1 IMPLANT
DISSECTOR HYDRO NUCLEUS 50X22 (MISCELLANEOUS) ×2 IMPLANT
DRSG TEGADERM 2-3/8X2-3/4 SM (GAUZE/BANDAGES/DRESSINGS) ×2 IMPLANT
FEE CATARACT SUITE SIGHTPATH (MISCELLANEOUS) ×2 IMPLANT
GLOVE BIOGEL PI IND STRL 8 (GLOVE) ×2 IMPLANT
GLOVE SURG LX STRL 7.5 STRW (GLOVE) ×2 IMPLANT
GLOVE SURG PROTEXIS BL SZ6.5 (GLOVE) ×1 IMPLANT
GLOVE SURG SYN 6.5 PF PI BL (GLOVE) ×2 IMPLANT
NDL FILTER BLUNT 18X1 1/2 (NEEDLE) ×2 IMPLANT
NEEDLE FILTER BLUNT 18X1 1/2 (NEEDLE) ×2 IMPLANT
PACK VIT ANT 23G (MISCELLANEOUS) IMPLANT
SUT NYLON 10-0 (SUTURE) IMPLANT
SYR 3ML LL SCALE MARK (SYRINGE) ×2 IMPLANT
SYR TB 1ML LUER SLIP (SYRINGE) IMPLANT

## 2023-06-03 NOTE — Op Note (Signed)
 OPERATIVE NOTE  Casey [name] [id number] 06/03/2023   PREOPERATIVE DIAGNOSIS: Nuclear sclerotic cataract left eye. H25.12   POSTOPERATIVE DIAGNOSIS: Nuclear sclerotic cataract left eye. H25.12   PROCEDURE:  Phacoemusification with posterior chamber intraocular lens placement of the left eye  Ultrasound time: Procedure(s): PHACOEMULSIFICATION, CATARACT, WITH NO IOL  IMPLANT (Left) PARACENTESIS, EYE, ANTERIOR CHAMBER (Left)  LENS:  * No implants in log *    SURGEON:  Rosy Cooper. Donalda Fruit, MD   ANESTHESIA:  Topical with tetracaine drops, augmented with 1% preservative-free intracameral lidocaine.   COMPLICATIONS:  None.   DESCRIPTION OF PROCEDURE:  The patient was identified in the holding room and transported to the operating room and placed in the supine position under the operating microscope.  The left eye was identified as the operative eye, which was prepped and draped in the usual sterile ophthalmic fashion.   A 1 millimeter clear-corneal paracentesis was made inferotemporally. Preservative-free 1% lidocaine mixed with 1:1,000 bisulfite-free aqueous solution of epinephrine was injected into the anterior chamber. Then dilute kenalog was injected intracamerally to stain the prolapsed vitreous from prior trauma. Another side port incision was made. Anterior vitrectomy was performed to remove the prolapsed vitreous. The anterior chamber was then filled with Viscoat viscoelastic. A 2.4 millimeter keratome was used to make a clear-corneal incision superotemporally. A curvilinear capsulorrhexis was made with a cystotome and capsulorrhexis forceps.  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 Four additional 1 mm incisions were made and MacKool capsular hooks were inserted through the incisions to stabilize the anterior capsule. Balanced salt solution was used to hydrodissect and hydrodelineate the nucleus. Phacoemulsification was then used to remove the lens nucleus and epinucleus.  Unfortunately the posterior capsule was already open due to trauma and nucleus removal could not be safely completed. Additional vitrectomy was performed. Dilate kenalog was injected and confirmed that no remaining vitreous was in the anterior chamber. Miostat was injected intracamerally. The capsular hooks were removed.   All incisions and main wound were hydrated with balanced salt solution.  The anterior chamber was inflated to a physiologic pressure with balanced salt solution.  The main incision was sutured closed with 10-0 Nylon. No wound leaks were noted. Moxifloxacin was injected intracamerally.  Timolol and Brimonidine drops were applied to the eye.  The patient was taken to the recovery room in stable condition.  [name] 06/03/2023, 1:23 PM

## 2023-06-03 NOTE — Anesthesia Postprocedure Evaluation (Signed)
 Anesthesia Post Note  Patient: Casey [name].  Procedure(s) Performed: PHACOEMULSIFICATION, CATARACT, WITH NO IOL  IMPLANT (Left: Eye) PARACENTESIS, EYE, ANTERIOR CHAMBER (Left: Eye)  Anesthesia Type: MAC Anesthetic complications: no Comments: Dr. Donalda Fruit to meet patient at his office immediately postop. Patient and wife received crackers & peanut butter and soft drink.     No notable events documented.   Last Vitals:  Vitals:   06/03/23 1328 06/03/23 1334  BP: (!) 158/95 (!) 158/95  Pulse: 65 61  Resp: 19 20  Temp:  (!) 36.3 C  SpO2: 99% 98%    Last Pain:  Vitals:   06/03/23 1334  TempSrc:   PainSc: 0-No pain                 Jakeb Lamping C Jaclene Bartelt

## 2023-06-03 NOTE — H&P (Signed)
 [facility]   Primary Care Physician:  Monique Ano, MD Ophthalmologist: Dr. Meg Spina  Pre-Procedure History & Physical: HPI:  Casey Scow. is a 63 y.o. male here for cataract surgery.   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   CORONARY/GRAFT ACUTE MI REVASCULARIZATION N/A 12/03/2019   Procedure: Coronary/Graft Acute MI Revascularization;  Surgeon: Wenona Hamilton, MD;  Location: [facility] INVASIVE CV LAB;  Service: Cardiovascular;  Laterality: N/A;   DIALYSIS FISTULA CREATION     last used 04/2009   INSERTION OF DIALYSIS CATHETER     cordis dialysis catheter placement   KIDNEY TRANSPLANT  2011   LEFT HEART CATH AND CORONARY ANGIOGRAPHY N/A 12/03/2019   Procedure: LEFT HEART CATH AND CORONARY ANGIOGRAPHY;  Surgeon: Wenona Hamilton, MD;  Location: [facility] INVASIVE CV LAB;  Service: Cardiovascular;  Laterality: N/A;   RIGHT HEART CATH N/A 12/05/2019   Procedure: RIGHT HEART CATH;  Surgeon: Darlis Eisenmenger, MD;  Location: [facility] INVASIVE CV LAB;  Service: Cardiovascular;  Laterality: N/A;    Prior to Admission medications   Medication Sig  Start Date End Date Taking? Authorizing Provider  amiodarone (PACERONE) 100 MG tablet Take 100 mg by mouth daily.   Yes [provider]  carvedilol (COREG) 25 MG tablet Take 1 tablet (25 mg total) by mouth 2 (two) times daily with a meal. PLEASE call [phone] to schedule overdue follow up 05/12/23  Yes Darlis Eisenmenger, MD  Cholecalciferol (VITAMIN D3) 50 MCG (2000 UT) capsule Take 2,000 Units by mouth daily.   Yes [provider]  ezetimibe (ZETIA) 10 MG tablet Take 1 tablet (10 mg total) by mouth daily. NEEDS FOLLOW UP APPOINTMENT FOR MORE REFILLS 07/30/22  Yes Darlis Eisenmenger, MD  fluorouracil (EFUDEX) 5 % cream Apply 1 application. topically as directed. 05/21/21  Yes [provider]  glipiZIDE (GLUCOTROL) 10 MG tablet Take 10 mg by mouth 2 (two) times daily before a meal.   Yes [provider]  insulin aspart (NOVOLOG) 100 UNIT/ML injection Inject 10 Units into the skin 3 (three) times daily before meals.   Yes [provider]  insulin glargine (LANTUS) 100 UNIT/ML injection Inject 15-17 Units into the skin at bedtime.   Yes [provider]  isosorbide-hydrALAZINE (BIDIL) 20-37.5 MG tablet Take 2 tablets by mouth 3 (three) times daily. NEEDS FOLLOW UP APPOINTMENT FOR MORE REFILLS 04/08/23  Yes Darlis Eisenmenger, MD  loratadine (CLARITIN) 10 MG tablet Take 10 mg by mouth daily as needed for allergies.   Yes [provider]  nitroGLYCERIN (NITROSTAT) 0.4 MG SL tablet Place 1 tablet (0.4 mg total) under the tongue every 5 (five) minutes as needed for chest pain. 08/11/21 05/26/23 Yes Laurey Morale, MD  predniSONE (DELTASONE) 5 MG tablet Take 1 tablet (5 mg total) by mouth daily with breakfast. 12/18/19  Yes Clegg, Amy D, NP  rosuvastatin (CRESTOR) 40 MG tablet TAKE 1 TABLET BY MOUTH EVERY DAY 10/13/21  Yes Laurey Morale, MD  simethicone (MYLICON) 125 MG chewable tablet Chew 125 mg by mouth every 6 (six) hours as needed for flatulence.    Yes [provider]  tacrolimus (PROGRAF) 1 MG capsule Take 3 mg by mouth 2 (two) times daily.   Yes [provider]  torsemide (DEMADEX) 20 MG tablet Take 20 mg by mouth daily as needed.   Yes [provider]  warfarin (COUMADIN) 1 MG tablet TAKE 1 TO 2 TABLETS BY MOUTH AS DIRECTED BY ANTICOAGULATION CLINIC. 11/16/22  Yes Debbe Odea, MD    Allergies as of 05/10/2023   (No Known Allergies)    Family History  Problem Relation Age of Onset   Diabetes Mother    Hyperlipidemia Mother    Cancer Father     Social History   Socioeconomic History   Marital status: Married    Spouse name: Not on file   Number of children: Not on file   Years of education: Not on file   Highest education level: Not on file  Occupational History   Not on file  Tobacco Use   Smoking status: Never   Smokeless tobacco: Never  Vaping Use   Vaping status: Never Used  Substance and Sexual Activity   Alcohol use: Yes    Comment: occasional   Drug use: No   Sexual activity: Not on file  Other Topics Concern   Not on file  Social History Narrative   Not on file   Social Drivers of Health   Financial Resource Strain: Low Risk  (04/21/2023)   Received from [facility]   Overall Financial Resource Strain (CARDIA)    Difficulty of Paying Living Expenses: Not hard at all  Food Insecurity: No Food Insecurity (04/21/2023)   Received from [facility]   Hunger Vital Sign    Worried About Running Out of Food in the Last Year: Never true    Ran Out of Food in the Last Year: Never true  Transportation Needs: No Transportation Needs (04/21/2023)   Received from [facility] - Transportation    In the past 12 months, has lack of transportation kept you from medical appointments or from getting medications?: No    Lack of Transportation (Non-Medical): No  Physical Activity: Not on file  Stress: Not on file  Social  Connections: Not on file  Intimate Partner Violence: Not on file    Review of Systems: See HPI, otherwise negative ROS  Physical Exam: Ht 6' (1.829 m)   Wt 85.3 kg   BMI 25.50 kg/m  General:   Alert, cooperative in NAD Head:  Normocephalic and atraumatic. Respiratory:  Normal work of breathing. Cardiovascular:  RRR  Impression/Plan: Casey Hampshire. is here for cataract surgery.  Risks, benefits, limitations, and alternatives regarding cataract surgery have been reviewed with the patient.  Questions have been answered.  All parties agreeable.   Casey Pandy, MD  06/03/2023, 7:21 AM

## 2023-06-03 NOTE — Transfer of Care (Signed)
 Immediate Anesthesia Transfer of Care Note  Patient: Casey [name].  Procedure(s) Performed: PHACOEMULSIFICATION, CATARACT, WITH NO IOL  IMPLANT (Left: Eye) PARACENTESIS, EYE, ANTERIOR CHAMBER (Left: Eye)  Patient Location: PACU  Anesthesia Type: MAC  Level of Consciousness: awake, alert  and patient cooperative  Airway and Oxygen Therapy: Patient Spontanous Breathing and Patient connected to supplemental oxygen  Post-op Assessment: Post-op Vital signs reviewed, Patient's Cardiovascular Status Stable, Respiratory Function Stable, Patent Airway and No signs of Nausea or vomiting  Post-op Vital Signs: Reviewed and stable  Complications: No notable events documented.

## 2023-06-11 ENCOUNTER — Telehealth: Payer: Self-pay | Admitting: Cardiology

## 2023-06-11 NOTE — Telephone Encounter (Signed)
 Called to confirm/remind patient of their appointment at the [facility] on 06/14/23.   Appointment:   [x] Confirmed  [] Left mess   [] No answer/No voice mail  [] VM Full/unable to leave message  [] Phone not in service  Patient reminded to bring all medications and/or complete list.  Confirmed patient has transportation. Gave directions, instructed to utilize valet parking.

## 2023-06-14 ENCOUNTER — Telehealth: Payer: Self-pay | Admitting: Pharmacist

## 2023-06-14 ENCOUNTER — Other Ambulatory Visit: Payer: Self-pay | Admitting: Cardiology

## 2023-06-14 ENCOUNTER — Other Ambulatory Visit (HOSPITAL_COMMUNITY): Payer: Self-pay

## 2023-06-14 ENCOUNTER — Ambulatory Visit: Attending: Cardiology | Admitting: Cardiology

## 2023-06-14 VITALS — BP 129/86 | HR 68 | Wt 186.0 lb

## 2023-06-14 DIAGNOSIS — Z955 Presence of coronary angioplasty implant and graft: Secondary | ICD-10-CM | POA: Insufficient documentation

## 2023-06-14 DIAGNOSIS — N184 Chronic kidney disease, stage 4 (severe): Secondary | ICD-10-CM | POA: Diagnosis not present

## 2023-06-14 DIAGNOSIS — I255 Ischemic cardiomyopathy: Secondary | ICD-10-CM | POA: Insufficient documentation

## 2023-06-14 DIAGNOSIS — Z79899 Other long term (current) drug therapy: Secondary | ICD-10-CM | POA: Insufficient documentation

## 2023-06-14 DIAGNOSIS — E1122 Type 2 diabetes mellitus with diabetic chronic kidney disease: Secondary | ICD-10-CM | POA: Insufficient documentation

## 2023-06-14 DIAGNOSIS — Z7901 Long term (current) use of anticoagulants: Secondary | ICD-10-CM | POA: Diagnosis not present

## 2023-06-14 DIAGNOSIS — I480 Paroxysmal atrial fibrillation: Secondary | ICD-10-CM | POA: Insufficient documentation

## 2023-06-14 DIAGNOSIS — Z794 Long term (current) use of insulin: Secondary | ICD-10-CM | POA: Insufficient documentation

## 2023-06-14 DIAGNOSIS — I5022 Chronic systolic (congestive) heart failure: Secondary | ICD-10-CM | POA: Diagnosis not present

## 2023-06-14 DIAGNOSIS — I252 Old myocardial infarction: Secondary | ICD-10-CM | POA: Diagnosis not present

## 2023-06-14 DIAGNOSIS — I236 Thrombosis of atrium, auricular appendage, and ventricle as current complications following acute myocardial infarction: Secondary | ICD-10-CM

## 2023-06-14 DIAGNOSIS — R7989 Other specified abnormal findings of blood chemistry: Secondary | ICD-10-CM | POA: Diagnosis not present

## 2023-06-14 DIAGNOSIS — I130 Hypertensive heart and chronic kidney disease with heart failure and stage 1 through stage 4 chronic kidney disease, or unspecified chronic kidney disease: Secondary | ICD-10-CM | POA: Diagnosis not present

## 2023-06-14 DIAGNOSIS — I2510 Atherosclerotic heart disease of native coronary artery without angina pectoris: Secondary | ICD-10-CM | POA: Diagnosis present

## 2023-06-14 DIAGNOSIS — Z7984 Long term (current) use of oral hypoglycemic drugs: Secondary | ICD-10-CM | POA: Insufficient documentation

## 2023-06-14 DIAGNOSIS — Z940 Kidney transplant status: Secondary | ICD-10-CM | POA: Diagnosis not present

## 2023-06-14 DIAGNOSIS — I509 Heart failure, unspecified: Secondary | ICD-10-CM

## 2023-06-14 MED ORDER — WARFARIN SODIUM 1 MG PO TABS: ORAL_TABLET | ORAL | Status: DC

## 2023-06-14 MED ORDER — EMPAGLIFLOZIN 10 MG PO TABS: 10.0000 mg | ORAL_TABLET | Freq: Every day | ORAL | 6 refills | Status: AC

## 2023-06-14 MED ORDER — APIXABAN 5 MG PO TABS: 5.0000 mg | ORAL_TABLET | Freq: Two times a day (BID) | ORAL | 11 refills | Status: AC

## 2023-06-14 MED ORDER — EZETIMIBE 10 MG PO TABS: 10.0000 mg | ORAL_TABLET | Freq: Every day | ORAL | 3 refills | Status: DC

## 2023-06-14 MED ORDER — EMPAGLIFLOZIN 10 MG PO TABS: 10.0000 mg | ORAL_TABLET | Freq: Every day | ORAL | 6 refills | Status: DC

## 2023-06-14 MED ORDER — EZETIMIBE 10 MG PO TABS: 10.0000 mg | ORAL_TABLET | Freq: Every day | ORAL | 3 refills | Status: AC

## 2023-06-14 MED ORDER — REPATHA SURECLICK 140 MG/ML ~~LOC~~ SOAJ: 140.0000 mg | SUBCUTANEOUS | 2 refills | Status: AC

## 2023-06-14 NOTE — Progress Notes (Signed)
 PCP: Monique Ano, MD Cardiology: Dr. Mitzie Anda  63 y.o. with history of renal transplant in 2011, CAD s/p anterior MI, and ischemic cardiomyopathy presents for followup of CHF and CAD.  Patient had his renal transplant at [facility], and has been followed by [facility] as well as [facility] since that time.  He had been doing well until 10/21.  In early 10/21, he developed chest pain.  He did not go immediately to the ER, he presented when the pain had continued for > 1 day.  He was found to have acute anterior MI with late presentation. He went for cath, LAD was occluded and there was severe diffuse disease in the RCA as well as severe disease in the LCx system.  He had DES to the LAD.  He subsequently developed cardiogenic shock as well as suspected septic shock, possible from gut source.  Abdominal imaging showed profound ileus.  He was taken to the OR for Impella 5.5 placement, but this was deferred due to the finding of LV thrombus on intra-op TEE.  He also developed atrial fibrillation with RVR, controlled by amiodarone  and eventually converted back to NSR.  He was maintained on milrinone  0.25 with gradual improvement.  However, he developed AKI, likely due to contrast as well as cardiorenal. Creatinine went up to 5.  Creatinine gradually improved down to 3.38 at discharge, and we were able to discontinue milrinone .  Ileus also gradually resolved.  Echo in the hospital showed EF 20-25%. He initially wore a Lifevest but sent it back.   Echo in 1/22 showed EF 30-35%, periapical akinesis, no LV thrombus, normal RV.  He saw EP to discuss ICD and atrial fibrillation ablation, but he was not ready for this yet.  However, repeat echo in 5/22 showed EF up to 40-45% with normal RV function.   Patient was admitted in 4/23 with PNA.   Echo in 8/23 showed EF 35% with peri-apical akinesis, no LV thrombus, normal RV.   He was lost to followup for about 2 years.  He was seen at [facility] in 2024 for  pre-operative evaluation pre-renal transplant. Cardiolite in 2/24 showed EF 34%, prior MI in RCA and LAD territories.   Patient returns for followup.  Amiodarone  is on his medication list but he says that he stopped this a long time ago. He is taking torsemide  1-2 times a month.  He is taking Crestor  every other day and is not taking Zetia . LFTs were elevated recently and PCP stopped Crestor .  LFTs were back to normal in 3/25 off Crestor , and it was restarted 3 times/week.  Patient can walk 1-1.5 miles without dyspnea.  No exertional chest pain. No problems walking up stairs.  No orthopnea/PND.  No lightheadedness, palpitations, or syncope.   ECG (personally reviewed): NSR, LVH, old ALMI, old inferior MI  Labs (10/21): K 4.3, creatinine 3.38 Labs (11/21): K 4.7, creatinine 2.24, LDL 68, HDL 43, hgb 11.9, LFTs normal Labs (12/21): K 3.9, creatinine 2.43 Labs (2/22): LDL 81, LFTs normal, K 3.7, creatinine 2.49 Labs (8/22): LDL 72, TGs 109 Labs (12/22): LFTs normal, K 3.9, creatinine 3.2 Labs (4/23): K 4.2, creatinine 4.04 Labs (2/25): K 3.9, creatinine 3.6, LDL 41, AST 133, ALT 130, alkaline phosphatase 242 Labs (3/25): LFTs normal, creatinine 2.6  PMH: 1. Type 2 diabetes 2. Atrial fibrillation: Paroxysmal, on amiodarone .  3. Renal transplant 2011 [facility].  4. GERD 5. 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 CAD: Late presentation anterior MI in 10/21.  Cath with occluded LAD, long up to 99% mid-distal RCA stenosis, 95% mid-distal LCx, 80% OM3.  He had DES to LAD.   - Cardiolite in 2/24 showed EF 34%, prior MI in RCA and LAD territories.  8. Chronic systolic CHF: Ischemic cardiomyopathy. Cardiogenic shock in 10/21 with MI.  - Echo (10/21): EF 20-25%, heavy smoke/early thrombus LV apex, RV normal.  - RHC (10/21, milrinone  0.25): mean RA 4, PA 22/4, mean PCWP 6, CI 2.06 Fick, CI 2.06 thermo.  - Echo (1/22): EF 30-35%, periapical akinesis, no LV thrombus, normal RV.  - Echo (5/22): EF  40-45% with periapical akinesis, normal RV.  - Echo (8/23): EF 35% with peri-apical akinesis, no LV thrombus, normal RV. 9. LV thrombus  SH: Married, nonsmoker, no ETOH.  Lives in [place]. 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   Family History  Problem Relation Age of Onset   Diabetes Mother    Hyperlipidemia Mother    Cancer Father    ROS: all systems reviewed and negative except as per HPI.   Current Outpatient Medications  Medication Sig Dispense Refill   apixaban (ELIQUIS) 5 MG TABS tablet Take 1 tablet (5 mg total) by mouth 2 (two) times daily. 60 tablet 11   carvedilol  (COREG ) 25 MG tablet Take 1 tablet (25 mg total) by mouth 2 (two) times daily with a meal. PLEASE call [phone] to schedule overdue follow up 60 tablet 1   Cholecalciferol (VITAMIN D3) 50 MCG (2000 UT) capsule Take 2,000 Units by mouth daily.     fluorouracil (EFUDEX) 5 % cream Apply 1 application. topically as directed.     glipiZIDE  (GLUCOTROL ) 10 MG tablet Take 10 mg by mouth 2 (two) times daily before a meal.     insulin  aspart (NOVOLOG ) 100 UNIT/ML injection Inject 10 Units into the skin 3 (three) times daily before meals.     insulin  glargine (LANTUS ) 100 UNIT/ML injection Inject 15-17 Units into the skin at bedtime.     isosorbide -hydrALAZINE  (BIDIL ) 20-37.5 MG tablet Take 2 tablets by mouth 3 (three) times daily. NEEDS FOLLOW UP APPOINTMENT FOR MORE REFILLS 540 tablet 0   loratadine (CLARITIN) 10 MG tablet Take 10 mg by mouth daily as needed for allergies.     predniSONE  (DELTASONE ) 5 MG tablet Take 1 tablet (5 mg total) by mouth daily with breakfast.     simethicone (MYLICON) 125 MG chewable tablet Chew 125 mg by mouth every 6 (six) hours as needed for flatulence.     tacrolimus  (PROGRAF ) 1 MG capsule Take 3 mg by mouth 2 (two) times daily.     torsemide  (DEMADEX ) 20 MG tablet Take 20 mg by mouth daily as needed.     empagliflozin (JARDIANCE) 10 MG TABS tablet Take 1 tablet (10 mg total) by mouth daily before  breakfast. 30 tablet 6   ezetimibe  (ZETIA ) 10 MG tablet Take 1 tablet (10 mg total) by mouth daily. 90 tablet 3   nitroGLYCERIN  (NITROSTAT ) 0.4 MG SL tablet Place 1 tablet (0.4 mg total) under the tongue every 5 (five) minutes as needed for chest pain. 30 tablet 3   No current facility-administered medications for this visit.   BP 129/86   Pulse 68   Wt 186 lb (84.4 kg)   SpO2 98%   BMI 25.23 kg/m  General: NAD Neck: No JVD, no thyromegaly or thyroid  nodule.  Lungs: Clear to auscultation bilaterally with normal respiratory effort. CV: Nondisplaced PMI.  Heart regular S1/S2, no S3/S4, no murmur.  No  peripheral edema.  No carotid bruit.  Normal pedal pulses.  Abdomen: Soft, nontender, no hepatosplenomegaly, no distention.  Skin: Intact without lesions or rashes.  Neurologic: Alert and oriented x 3.  Psych: Normal affect. Extremities: No clubbing or cyanosis.  HEENT: Normal.   Assessment/Plan: 1. CAD: S/p late presentation anterior MI in 10/21 with DES to LAD.  He has residual severe disease in the RCA which is not revascularizable.   He has 95% mid-distal LCx stenosis and 80% OM3 stenosis that could potentially be intervened upon.  Cardiolite in 2/24 showed prior MI in RCA and LAD territories, no ischemia.  No chest pain.  - He is anticoagulated so no ASA. - With elevated LFTs (resolving with cessation of Crestor ), think we should avoid statin for now.  I will have him stop Crestor , restart Zetia , and will work on getting Repatha for him.   2. Atrial fibrillation: Paroxysmal.  He is in NSR today. Amiodarone  is on his medication list but he says that he has not taken it for "a long time."   - Transition from warfarin to Eliquis if he can get insurance coverage.  - If AF recurs, recommended ablation.    3. Chronic systolic CHF: Ischemic cardiomyopathy.  Echo in 10/21 with EF 20-25%.  Echo in 1/22 showed EF 30-35% with regional wall motion abnormalities.  Echo in 5/22 with EF up to 40-45%.   Echo in 8/23 showed EF 35% with apical akinesis.  Cardiolite in 2/24 showed EF 34%. He is not volume overloaded on exam, NYHA class I-II.  - I will arrange for echo.  - With significant scar on Cardiolite and history of ischemic cardiomyopathy, if EF on repeat echo is < 35%, I recommended an ICD.  He has been reticent in the past to consider an ICD.  We discussed again in detail today.  - He can continue to use torsemide  prn.  - Continue Bidil  2 tabs tid.  - Continue Coreg  25 mg bid.  - Avoid ARNI/spironolactone  with elevated creatinine.  - With last creatinine 2.6, I think he can start Jardiance 10 mg daily.  BMET/BNP today and BMET in 10 days.  4. LV thrombus: No thrombus on 8/23 echo.   - I think that it would be ok to switch to DOAC off warfarin, will see if insurance will cover.   5. 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  6. Elevated LFTs: He has not been taking amiodarone .  Possibly due to rosuvastatin , elevated LFTs normalized after stopping statin.  Now he is back on it at 40 mg every other day.  - Check LFTs today.  - I think it would be reasonable to stop Crestor  and start him on Zetia  10 mg daily + Repatha.  Lipids in 2 months.   Followup in 3 months.   I spent 41 minutes reviewing records, interviewing/examining patient, and managing orders.   Casey [name] 06/14/2023

## 2023-06-14 NOTE — Telephone Encounter (Signed)
 Prior authorization initiated for Repatha and approved. Patient called and notified.

## 2023-06-14 NOTE — Patient Instructions (Signed)
 Medication Changes:  STOP Crestor   START Zetia  10mg  (1 tab) daily  START Jardiance 10mg  (1 tab) daily  Lab Work:  Go DOWN to LOWER LEVEL (LL) to have your blood work completed inside of [employer] office TODAY.  Go over to the MEDICAL MALL. Go pass the gift shop and have your blood work completed IN ONE WEEK.  We will only call you if the results are abnormal or if the provider would like to make medication changes.   Testing/Procedures:  Your physician has requested that you have an echocardiogram. Echocardiography is a painless test that uses sound waves to create images of your heart. It provides your doctor with information about the size and shape of your heart and how well your heart's chambers and valves are working. This procedure takes approximately one hour. There are no restrictions for this procedure. Please do NOT wear cologne, perfume, aftershave, or lotions (deodorant is allowed). Please arrive 15 minutes prior to your appointment time.  Please note: We ask at that you not bring children with you during ultrasound (echo/ vascular) testing. Due to room size and safety concerns, children are not allowed in the ultrasound rooms during exams. Our front office staff cannot provide observation of children in our lobby area while testing is being conducted. An adult accompanying a patient to their appointment will only be allowed in the ultrasound room at the discretion of the ultrasound technician under special circumstances. We apologize for any inconvenience.   Please have your echo completed. You will check in for this at the MEDICAL MALL. You have to arrive 15 MINS EARLY for preparation, otherwise you will have to reschedule.    Follow-Up in: Please follow up with the [facility] in 3 months with Dr. Mitzie Anda. We currently do not have that schedule. Please give us  a call in June in order to schedule your appointment for July.  At the [facility], you and your health needs are our priority. We have a designated team specialized in the treatment of Heart Failure. This Care Team includes your primary Heart Failure Specialized Cardiologist (physician), Advanced Practice Providers (APPs- Physician Assistants and Nurse Practitioners), and Pharmacist who all work together to provide you with the care you need, when you need it.   You may see any of the following providers on your designated Care Team at your next follow up:  Dr. Jules Oar Dr. Peder Bourdon Dr. Alwin Baars Dr. Judyth Nunnery Shawnee Dellen, FNP Bevely Brush, RPH-CPP  Please be sure to bring in all your medications bottles to every appointment.   Need to Contact Us :  If you have any questions or concerns before your next appointment please send us  a message through Christmas or call our office at [phone].    TO LEAVE A MESSAGE FOR THE NURSE SELECT OPTION 2, PLEASE LEAVE A MESSAGE INCLUDING: YOUR NAME DATE OF BIRTH CALL BACK NUMBER REASON FOR CALL**this is important as we prioritize the call backs  YOU WILL RECEIVE A CALL BACK THE SAME DAY AS LONG AS YOU CALL BEFORE 4:00 PM

## 2023-06-15 LAB — COMPREHENSIVE METABOLIC PANEL WITH GFR
ALT: 28 IU/L (ref 0–44)
AST: 26 IU/L (ref 0–40)
Albumin: 3.5 g/dL — ABNORMAL LOW (ref 3.9–4.9)
Alkaline Phosphatase: 68 IU/L (ref 44–121)
BUN/Creatinine Ratio: 13 (ref 10–24)
BUN: 41 mg/dL — ABNORMAL HIGH (ref 8–27)
Bilirubin Total: 0.3 mg/dL (ref 0.0–1.2)
CO2: 17 mmol/L — ABNORMAL LOW (ref 20–29)
Calcium: 9.3 mg/dL (ref 8.6–10.2)
Chloride: 112 mmol/L — ABNORMAL HIGH (ref 96–106)
Creatinine, Ser: 3.04 mg/dL — ABNORMAL HIGH (ref 0.76–1.27)
Globulin, Total: 2.3 g/dL (ref 1.5–4.5)
Glucose: 112 mg/dL — ABNORMAL HIGH (ref 70–99)
Potassium: 3.9 mmol/L (ref 3.5–5.2)
Sodium: 141 mmol/L (ref 134–144)
Total Protein: 5.8 g/dL — ABNORMAL LOW (ref 6.0–8.5)
eGFR: 22 mL/min/{1.73_m2} — ABNORMAL LOW (ref >59)

## 2023-06-15 LAB — BRAIN NATRIURETIC PEPTIDE: BNP: 331.7 pg/mL — ABNORMAL HIGH (ref 0.0–100.0)

## 2023-06-23 ENCOUNTER — Encounter

## 2023-07-08 ENCOUNTER — Other Ambulatory Visit (HOSPITAL_COMMUNITY): Payer: Self-pay | Admitting: Cardiology

## 2023-08-02 ENCOUNTER — Ambulatory Visit: Admission: RE | Admit: 2023-08-02 | Source: Ambulatory Visit

## 2023-10-06 ENCOUNTER — Telehealth (HOSPITAL_COMMUNITY): Payer: Self-pay

## 2023-10-06 NOTE — Telephone Encounter (Signed)
  [facility]   Pre-operative Risk Assessment    Request for Surgical Clearance    Procedure:  Not listed--- just would like to know if patient needs pre meds for dental treatment due to cardiac history.   { Date of Surgery:  Clearance TBD                             \  { Surgeon:  Artis  Surgeon's Group or Practice Name:  [facility]  Phone number:  [phone] Fax number:  [phone] { Type of Clearance Requested:   - Medical    Type of Anesthesia:  not listed    Signed, Briget Shaheed B Neenah Canter   10/06/2023, 11:13 AM

## 2023-10-11 NOTE — Telephone Encounter (Signed)
 Spoke with patients wife, reports this has already been addressed.

## 2023-10-14 ENCOUNTER — Other Ambulatory Visit (HOSPITAL_COMMUNITY): Payer: Self-pay | Admitting: Cardiology

## 2023-11-29 ENCOUNTER — Encounter: Admitting: Cardiology

## 2023-12-09 NOTE — Progress Notes (Signed)
 Chief complaint: Diabetes  History of present illness: Casey Gura. is 63 y.o. male seen in follow up for type II diabetes. He was last seen on 04/21/2023. He did not show to his scheduled follow up appointments on 08/05/23 and 10/04/23.   His Hb A1c is now 8.3%, which is stable from prior (was 8.4% in 04/2023). He has been checking his sugars with the DexCom G7 sensors (uses manual receiver now since he was not allowed to take his phone into his work). Over the last 14-days his average sugar was 165 mg/dl. GMI is 7.3%. He is in range 63%, high 22%, very high 13% and low 2%. No recent severe hypoglycemia. Pattern shows sugars are typically stable and in range overnight/fasting. Worse post-prandial spikes are at lunch time. There is some minimal post-prandial hyperglycemia at supper.  He is taking glipizide  10 mg BID, Lantus  15 units QHS (skips if PM sugar is <150) and Novolog  5-10 units after meals if blood sugars are >250 mg/dl. He was also recently started on Jardiance  10 mg QD by his cardiologist.  Diabetes is complicated by recurrent ESRD (s/p renal transplant in 2011 and recent eGFR of 20) and is on chronic immunosuppression and glucocorticoids. He is also with cardiovascular disease (is s/p STEMI in 2021 and Afib on amiodarone ). He is not on a statin anymore (had acutely elevated LFTs) and was switched to Repatha  injections earlier this month. He tolerates this well. BP is elevated in office today, but was normal on prior readings. He last had an eye exam at [facility] on 05/04/2023. No signs of diabetic retinopathy were noted.  He follows with Dr. Alla (primary care physician), Dr. Ezra Shuck (cardiology), and the [facility] transplant team and Dr. Rayburn (nephrology).   Past Medical History:  Diagnosis Date  . Diabetes mellitus (CMS/HHS-HCC)    post transplant  . ESRD (end stage renal disease) (CMS/HHS-HCC)    secondary to hypertension  . Hypertension   . Myocardial  infarction (CMS/HHS-HCC)     Past Surgical History:  Procedure Laterality Date  . TRANSPLANT KIDNEY  04/17/2009  . CORONARY ANGIOPLASTY  12/03/2019   DES to LAD    Family History  Problem Relation Name Age of Onset  . High blood pressure (Hypertension) Mother    . Diabetes Mother    . No Known Problems Father    . No Known Problems Sister    . No Known Problems Brother    . No Known Problems Sister    . No Known Problems Sister    . No Known Problems Brother      Outpatient Medications Marked as Taking for the 12/09/23 encounter (Office Visit) with Brendia Calton Squires, PA  Medication Sig Dispense Refill  . carvediloL  (COREG ) 25 MG tablet Take 25 mg by mouth 2 (two) times daily    . cholecalciferol (VITAMIN D3) 2,000 unit tablet Take 1 tablet (2,000 Units total) by mouth once daily 90 tablet 3  . ELIQUIS  5 mg tablet Take 5 mg by mouth 2 (two) times daily    . ezetimibe  (ZETIA ) 10 mg tablet Take 10 mg by mouth once daily    . glipiZIDE  (GLUCOTROL  XL) 10 MG XL tablet Take 1 tablet (10 mg total) by mouth 2 (two) times daily 180 tablet 1  . insulin  ASPART (NOVOLOG  FLEXPEN) pen injector (concentration 100 units/mL) Inject 7 units TID before meals. Adjust dose as directed. Max daily dose of up to 50 units. 15 mL 3  .  insulin  GLARGINE (LANTUS  SOLOSTAR U-100 INSULIN ) pen injector (concentration 100 units/mL) Inject 15 units at bedtime, adjust dose as directed. Max daily dose of up to 30 units. 15 mL 0  . isosorbide -hydrALAZINE  (BIDIL ) 20-37.5 mg per tablet Take 2 tablets by mouth 2 (two) times daily       . JARDIANCE  10 mg tablet Take 10 mg by mouth every morning before breakfast    . ketorolac  (ACULAR ) 0.5 % ophthalmic solution Place 1 drop into the left eye 4 (four) times daily    . loratadine (CLARITIN) 10 mg tablet Take 10 mg by mouth once daily As needed    . prednisoLONE acetate (PRED FORTE) 1 % ophthalmic suspension Place 1 drop into the left eye 4 (four) times daily     . predniSONE  (DELTASONE ) 5 MG tablet Take 1 tablet (5 mg total) by mouth once daily 90 tablet 3  . REPATHA  SURECLICK 140 mg/mL PnIj Inject 140 mg subcutaneously every 14 (fourteen) days 2 mL 3  . tacrolimus  (PROGRAF ) 1 MG capsule Take 3 capsules (3 mg total) by mouth every 12 (twelve) hours 180 capsule 11  . TORsemide  (DEMADEX ) 20 MG tablet TAKE 1 TABLET BY MOUTH EVERY DAY (Patient taking differently: once daily as needed As needed) 90 tablet 3    Exam: BP (!) 152/84   Pulse 74   Ht 180.3 cm (5' 11)   Wt 88.9 kg (196 lb)   SpO2 98%   BMI 27.34 kg/m  GEN: well developed, well nourished, in NAD. HEENT: No proptosis. EOMI. No lid lag or stare.  PSYC: alert and oriented, good insight   Labs Lab Results  Component Value Date   HGBA1C 8.3 (H) 11/15/2023   HGBA1C 8.4 (H) 04/14/2023   Lab Results  Component Value Date   WBC 7.5 11/15/2023   HGB 12.0 (L) 11/15/2023   HCT 37.4 (L) 11/15/2023   PLT 150 11/15/2023   TRIG 70 11/15/2023   HDL 41.1 11/15/2023   LDLDIRECT 103 04/10/2010   ALT 70 (H) 11/22/2023   AST 22 11/22/2023   NA 141 11/15/2023   K 3.8 11/15/2023   CL 113 (H) 11/15/2023   CREATININE 3.3 (H) 11/15/2023   BUN 41 (H) 11/15/2023   CO2 23.3 11/15/2023   TSH 1.180 08/26/2022   INR 1.2 (H) 04/30/2009   HGBA1C 8.3 (H) 11/15/2023    Assessment: 1. Type 2 diabetes mellitus with ESRD (end-stage renal disease) (CMS/HHS-HCC)   2. Type 2 diabetes mellitus treated with insulin  (CMS/HHS-HCC)   3. Type 2 diabetes mellitus with other circulatory complication, unspecified whether long term insulin  use (CMS/HHS-HCC)   4. Hyperlipidemia associated with type 2 diabetes mellitus (CMS/HHS-HCC)   5. Hypertension associated with type 2 diabetes mellitus (CMS/HHS-HCC)     Plan: - Diabetes is not ideally controlled and Hb A1c remains above target. His recent CGMS data shows improved glycemic control that what A1c suggests.  Management of diabetes is complicated due to recurrent  ESRD s/p renal transplant, so treatment options are limited. - Fasting sugars are excellent. Continue Lantus  15 units QHS.  - Advised he consistently take Novolog  with each lunch and supper BEFORE he eats. Only skip this dose if pre-meal sugar is <100 mg/dl. He does not need this with breakfast meals.    - Instructed to monitor blood sugars 4x/day. Reminded to bring blood sugar log and/or meter/receiver to every visit. - Encouraged low-carb diet and avoidance of sugary beverages. Encouraged daily exercise. - Continue Repatha . - Continue annual  eye exams. He is up to date on this. - Continue follow up with primary care physician, cardiology, transplant team and nephrology as planned. - Follow up in 3 months, or sooner should any other issues arise. Will check STAT A1c at that time.    Attestation Statement:   I personally performed the service, non-incident to. (WP)   CASSANDRA BUEL COHN, PA

## 2023-12-23 ENCOUNTER — Inpatient Hospital Stay: Admission: RE | Admit: 2023-12-23 | Source: Ambulatory Visit

## 2023-12-27 ENCOUNTER — Encounter: Admitting: Cardiology

## 2024-01-21 ENCOUNTER — Telehealth: Payer: Self-pay | Admitting: Family

## 2024-01-21 NOTE — Telephone Encounter (Signed)
 Called to confirm/remind patient of their appointment at the [facility] on 01/24/24.   Appointment:   [] Confirmed  [x] Left mess   [] No answer/No voice mail  [] VM Full/unable to leave message  [] Phone not in service  Patient reminded to bring all medications and/or complete list.  Confirmed patient has transportation. Gave directions, instructed to utilize valet parking.

## 2024-01-23 NOTE — Progress Notes (Unsigned)
 [facility] Note    PCP: Alla Amis, MD Cardiology: Dr. Rolan  Chief Complaint: fatigue   HPI: [name] is a 63 y.o. male with history of renal transplant in 2011, CAD s/p anterior MI, and ischemic cardiomyopathy presents for followup of CHF and CAD.  Patient had his renal transplant at [facility], and has been followed by [facility] as well as [facility] since that time.  He had been doing well until 10/21.  In early 10/21, he developed chest pain.  He did not go immediately to the ER, he presented when the pain had continued for > 1 day.  He was found to have acute anterior MI with late presentation. He went for cath, LAD was occluded and there was severe diffuse disease in the RCA as well as severe disease in the LCx system.  He had DES to the LAD.  He subsequently developed cardiogenic shock as well as suspected septic shock, possible from gut source.  Abdominal imaging showed profound ileus.  He was taken to the OR for Impella 5.5 placement, but this was deferred due to the finding of LV thrombus on intra-op TEE.  He also developed atrial fibrillation with RVR, controlled by amiodarone  and eventually converted back to NSR.  He was maintained on milrinone  0.25 with gradual improvement.  However, he developed AKI, likely due to contrast as well as cardiorenal. Creatinine went up to 5.  Creatinine gradually improved down to 3.38 at discharge, and we were able to discontinue milrinone .  Ileus also gradually resolved.  Echo in the hospital showed EF 20-25%. He initially wore a Lifevest but sent it back.   Echo in 1/22 showed EF 30-35%, periapical akinesis, no LV thrombus, normal RV.  He saw EP to discuss ICD and atrial fibrillation ablation, but he was not ready for this yet.  However, repeat echo in 5/22 showed EF up to 40-45% with normal RV function.   Patient was admitted in 4/23 with PNA.   Echo in 8/23 showed EF 35% with peri-apical akinesis, no LV thrombus,  normal RV.   He was lost to followup for about 2 years.  He was seen at [facility] in 2024 for pre-operative evaluation pre-renal transplant. Cardiolite in 2/24 showed EF 34%, prior MI in RCA and LAD territories.   Patient returns, with his wife, for HF follow-up visit with a chief complaint of minimal fatigue. Took torsemide  this morning due to pedal edema. Estimates that he takes it ~ 3 months. Denies any shortness of breath, chest pain, palpitations, dizziness. Continues to work for [employer] so is quite active at work. Reports sleeping well. Follows at [facility] in [place] and had lab work done last month. Did not show for his Echo last month.   ECG not done  Labs (10/21): K 4.3, creatinine 3.38 Labs (11/21): K 4.7, creatinine 2.24, LDL 68, HDL 43, hgb 11.9, LFTs normal Labs (12/21): K 3.9, creatinine 2.43 Labs (2/22): LDL 81, LFTs normal, K 3.7, creatinine 2.49 Labs (8/22): LDL 72, TGs 109 Labs (12/22): LFTs normal, K 3.9, creatinine 3.2 Labs (4/23): K 4.2, creatinine 4.04 Labs (2/25): K 3.9, creatinine 3.6, LDL 41, AST 133, ALT 130, alkaline phosphatase 242 Labs (3/25): LFTs normal, creatinine 2.6 Labs (4/25): K 3.9, creatinine 3.04, eGFR 22  PMH: 1. Type 2 diabetes 2. Atrial fibrillation: Paroxysmal, on amiodarone .  3. Renal transplant 2011 [facility].  4. GERD 5. 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 CAD: Late presentation anterior  MI in 10/21.  Cath with occluded LAD, long up to 99% mid-distal RCA stenosis, 95% mid-distal LCx, 80% OM3.  He had DES to LAD.   - Cardiolite in 2/24 showed EF 34%, prior MI in RCA and LAD territories.  8. Chronic systolic CHF: Ischemic cardiomyopathy. Cardiogenic shock in 10/21 with MI.  - Echo (10/21): EF 20-25%, heavy smoke/early thrombus LV apex, RV normal.  - RHC (10/21, milrinone  0.25): mean RA 4, PA 22/4, mean PCWP 6, CI 2.06 Fick, CI 2.06 thermo.  - Echo (1/22): EF 30-35%, periapical akinesis, no LV thrombus, normal RV.  -  Echo (5/22): EF 40-45% with periapical akinesis, normal RV.  - Echo (8/23): EF 35% with peri-apical akinesis, no LV thrombus, normal RV. 9. LV thrombus  SH: Married, nonsmoker, no ETOH.  Lives in [place]. 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   Family History  Problem Relation Age of Onset   Diabetes Mother    Hyperlipidemia Mother    Cancer Father    ROS: all systems reviewed and negative except as per HPI.   Current Outpatient Medications  Medication Sig Dispense Refill   apixaban  (ELIQUIS ) 5 MG TABS tablet Take 1 tablet (5 mg total) by mouth 2 (two) times daily. 60 tablet 11   carvedilol  (COREG ) 25 MG tablet TAKE 1 TABLET BY MOUTH 2 TIMES DAILY WITH A MEAL CALL [phone] TO SCHEDULE OVERDUE FOLLOW UP 180 tablet 1   Cholecalciferol (VITAMIN D3) 50 MCG (2000 UT) capsule Take 2,000 Units by mouth daily.     empagliflozin  (JARDIANCE ) 10 MG TABS tablet Take 1 tablet (10 mg total) by mouth daily before breakfast. 30 tablet 6   Evolocumab  (REPATHA  SURECLICK) 140 MG/ML SOAJ Inject 140 mg into the skin every 14 (fourteen) days. 2 mL 2   ezetimibe  (ZETIA ) 10 MG tablet Take 1 tablet (10 mg total) by mouth daily. 90 tablet 3   fluorouracil (EFUDEX) 5 % cream Apply 1 application. topically as directed.     glipiZIDE  (GLUCOTROL ) 10 MG tablet Take 10 mg by mouth 2 (two) times daily before a meal.     insulin  aspart (NOVOLOG ) 100 UNIT/ML injection Inject 10 Units into the skin 3 (three) times daily before meals.     insulin  glargine (LANTUS ) 100 UNIT/ML injection Inject 15-17 Units into the skin at bedtime.     isosorbide -hydrALAZINE  (BIDIL ) 20-37.5 MG tablet TAKE 2 TABLETS BY MOUTH 3 (THREE) TIMES DAILY. NEEDS FOLLOW UP APPOINTMENT FOR MORE REFILLS 540 tablet 1   loratadine (CLARITIN) 10 MG tablet Take 10 mg by mouth daily as needed for allergies.     nitroGLYCERIN  (NITROSTAT ) 0.4 MG SL tablet Place 1 tablet (0.4 mg total) under the tongue every 5 (five) minutes as needed for chest pain. 30 tablet 3    predniSONE  (DELTASONE ) 5 MG tablet Take 1 tablet (5 mg total) by mouth daily with breakfast.     simethicone (MYLICON) 125 MG chewable tablet Chew 125 mg by mouth every 6 (six) hours as needed for flatulence.     tacrolimus  (PROGRAF ) 1 MG capsule Take 3 mg by mouth 2 (two) times daily.     torsemide  (DEMADEX ) 20 MG tablet Take 20 mg by mouth daily as needed.     No current facility-administered medications for this visit.   Vitals:   01/24/24 1442  BP: 124/70  Pulse: 68  SpO2: 96%  Weight: 196 lb 6 oz (89.1 kg)   Wt Readings from Last 3 Encounters:  01/24/24 196 lb 6 oz (  89.1 kg)  06/14/23 186 lb (84.4 kg)  06/03/23 185 lb 8 oz (84.1 kg)   Lab Results  Component Value Date   CREATININE 3.04 (H) 06/14/2023   CREATININE 3.90 (H) 09/30/2021   CREATININE 4.04 (H) 06/06/2021    Physical Exam: General: Well appearing.  Cor: No JVD. Regular rhythm, rate.  Lungs: clear Abdomen: soft, nontender, nondistended. Extremities: no edema Neuro:. Affect pleasant   Assessment/Plan: 1. CAD: S/p late presentation anterior MI in 10/21 with DES to LAD.  He has residual severe disease in the RCA which is not revascularizable.   He has 95% mid-distal LCx stenosis and 80% OM3 stenosis that could potentially be intervened upon.  Cardiolite in 2/24 showed prior MI in RCA and LAD territories, no ischemia.  No chest pain.  - He is anticoagulated so no ASA. - With elevated LFTs (resolving with cessation of Crestor ), think we should avoid statin for now. Continue zetia  10mg  daily and repatha  every 2 weeks.  2. Atrial fibrillation: Paroxysmal.   - Continue eliquis  5mg  BID (was previously on warfarin) - If AF recurs, recommended ablation.    3. Chronic systolic CHF: Ischemic cardiomyopathy.  Echo in 10/21 with EF 20-25%.  Echo in 1/22 showed EF 30-35% with regional wall motion abnormalities.  Echo in 5/22 with EF up to 40-45%.  Echo in 8/23 showed EF 35% with apical akinesis.  Cardiolite in 2/24 showed EF  34%. He is not volume overloaded on exam, NYHA class I-II.  - NS for echo 06/25, instructed that he will be called to schedule this - With significant scar on Cardiolite and history of ischemic cardiomyopathy, if EF on repeat echo is < 35%, I recommended an ICD.  He has been reticent in the past to consider an ICD.   - He can continue to use torsemide  prn. Took it this morning due to pedal edema but estimates that he takes it ~ 3 months.  - Continue Bidil  2 tabs tid.  - Continue Coreg  25 mg bid.  - Avoid ARNI/spironolactone  with elevated creatinine.  - continue Jardiance  10 mg daily.   4. LV thrombus: No thrombus on 8/23 echo.   - Switched to eliquis  5mg  BID off of warfarin 5. 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  6: HLD:  - statins were held with subsequent normalization of LFT's - Continue Zetia  10 mg daily + Repatha .  - lipid panel today   Return in 2-3 months after his echo, sooner if needed.   I spent 38 minutes reviewing records, interviewing/ examing patient and managing plan/ orders.    Ellouise DELENA Class 01/23/2024

## 2024-01-24 ENCOUNTER — Encounter: Payer: Self-pay | Admitting: Family

## 2024-01-24 ENCOUNTER — Ambulatory Visit: Attending: Family | Admitting: Family

## 2024-01-24 VITALS — BP 124/70 | HR 68 | Wt 196.4 lb

## 2024-01-24 DIAGNOSIS — N184 Chronic kidney disease, stage 4 (severe): Secondary | ICD-10-CM

## 2024-01-24 DIAGNOSIS — E782 Mixed hyperlipidemia: Secondary | ICD-10-CM

## 2024-01-24 DIAGNOSIS — I480 Paroxysmal atrial fibrillation: Secondary | ICD-10-CM

## 2024-01-24 DIAGNOSIS — I236 Thrombosis of atrium, auricular appendage, and ventricle as current complications following acute myocardial infarction: Secondary | ICD-10-CM

## 2024-01-24 DIAGNOSIS — I2510 Atherosclerotic heart disease of native coronary artery without angina pectoris: Secondary | ICD-10-CM | POA: Diagnosis not present

## 2024-01-24 DIAGNOSIS — I5022 Chronic systolic (congestive) heart failure: Secondary | ICD-10-CM | POA: Diagnosis not present

## 2024-01-24 LAB — BASIC METABOLIC PANEL WITH GFR
BUN/Creatinine Ratio: 13 (ref 10–24)
BUN: 46 mg/dL — ABNORMAL HIGH (ref 8–27)
CO2: 18 mmol/L — ABNORMAL LOW (ref 20–29)
Calcium: 9.3 mg/dL (ref 8.6–10.2)
Chloride: 111 mmol/L — ABNORMAL HIGH (ref 96–106)
Creatinine, Ser: 3.5 mg/dL — ABNORMAL HIGH (ref 0.76–1.27)
Glucose: 171 mg/dL — ABNORMAL HIGH (ref 70–99)
Potassium: 4.1 mmol/L (ref 3.5–5.2)
Sodium: 144 mmol/L (ref 134–144)
eGFR: 19 mL/min/1.73 — ABNORMAL LOW (ref >59)

## 2024-01-24 LAB — LIPID PANEL
Chol/HDL Ratio: 1.8 ratio (ref 0.0–5.0)
Cholesterol, Total: 127 mg/dL (ref 100–199)
HDL: 72 mg/dL (ref >39)
LDL Chol Calc (NIH): 37 mg/dL (ref 0–99)
Triglycerides: 99 mg/dL (ref 0–149)
VLDL Cholesterol Cal: 18 mg/dL (ref 5–40)

## 2024-01-24 NOTE — Patient Instructions (Signed)
 Medication Changes:  None, continue current medications  Lab Work:  Labs are needed today, **Please go downstairs to [facility] on LOWER LEVEL to have your blood work completed.  Your results will be available in MyChart. We will contact you for abnormal readings.   Testing/Procedures:  Your physician has requested that you have an echocardiogram. Echocardiography is a painless test that uses sound waves to create images of your heart. It provides your doctor with information about the size and shape of your heart and how well your heart's chambers and valves are working. This procedure takes approximately one hour. There are no restrictions for this procedure. Please do NOT wear cologne, perfume, aftershave, or lotions (deodorant is allowed). Please arrive 15 minutes prior to your appointment time. **YOU WILL BE CALLED TO SCHEDULE THIS  Please note: We ask at that you not bring children with you during ultrasound (echo/ vascular) testing. Due to room size and safety concerns, children are not allowed in the ultrasound rooms during exams. Our front office staff cannot provide observation of children in our lobby area while testing is being conducted. An adult accompanying a patient to their appointment will only be allowed in the ultrasound room at the discretion of the ultrasound technician under special circumstances. We apologize for any inconvenience.  Special Instructions // Education:  Do the following things EVERYDAY: Weigh yourself in the morning before breakfast. Write it down and keep it in a log. Take your medicines as prescribed Eat low salt foods--Limit salt (sodium) to 2000 mg per day.  Stay as active as you can everyday Limit all fluids for the day to less than 2 liters   Follow-Up in: 2-3 months (Feb/March 2026), **YOU WILL BE CALLED CLOSER TO THIS TIME TO SCHEDULE    If you have any questions or concerns before your next appointment please send us  a message through  [place] or call our office at [phone], If it is after office hours your call will be answered by our answering service and directed appropriately.     At the [facility], you and your health needs are our priority. We have a designated team specialized in the treatment of Heart Failure. This Care Team includes your primary Heart Failure Specialized Cardiologist (physician), Advanced Practice Providers (APPs- Physician Assistants and Nurse Practitioners), and Pharmacist who all work together to provide you with the care you need, when you need it.   You may see any of the following providers on your designated Care Team at your next follow up:  Dr. Toribio Fuel Dr. Ezra Shuck Dr. Ria Commander Dr. Odis Brownie Greig Mosses, NP Caffie Shed, [place] [address] [place], [place] Beckey Coe, NP Jordan Lee, NP Ellouise Class, NP Jaun Bash, PharmD

## 2024-01-25 ENCOUNTER — Ambulatory Visit: Payer: Self-pay | Admitting: Family

## 2024-03-09 ENCOUNTER — Ambulatory Visit: Attending: Family

## 2024-03-09 DIAGNOSIS — I5022 Chronic systolic (congestive) heart failure: Secondary | ICD-10-CM

## 2024-03-09 LAB — ECHOCARDIOGRAM COMPLETE
AR max vel: 1.89 cm2
AV Area VTI: 1.82 cm2
AV Area mean vel: 1.77 cm2
AV Mean grad: 7 mmHg
AV Peak grad: 13.4 mmHg
Ao pk vel: 1.83 m/s
Area-P 1/2: 4.31 cm2
S' Lateral: 4.66 cm

## 2024-04-10 ENCOUNTER — Ambulatory Visit: Admitting: Cardiology
